# Patient Record
Sex: Female | Born: 1960 | Race: White | Hispanic: No | Marital: Married | Smoking: Current every day smoker
Health system: Southern US, Community
[De-identification: ages and names within clinical notes are randomized; demographics above are authoritative.]

## PROBLEM LIST (undated history)

## (undated) DIAGNOSIS — T7840XA Allergy, unspecified, initial encounter: Secondary | ICD-10-CM

## (undated) DIAGNOSIS — N183 Chronic kidney disease, stage 3 unspecified: Secondary | ICD-10-CM

## (undated) DIAGNOSIS — R519 Headache, unspecified: Secondary | ICD-10-CM

## (undated) DIAGNOSIS — H269 Unspecified cataract: Secondary | ICD-10-CM

## (undated) DIAGNOSIS — Z720 Tobacco use: Secondary | ICD-10-CM

## (undated) DIAGNOSIS — E039 Hypothyroidism, unspecified: Secondary | ICD-10-CM

## (undated) DIAGNOSIS — F32A Depression, unspecified: Secondary | ICD-10-CM

## (undated) DIAGNOSIS — F419 Anxiety disorder, unspecified: Secondary | ICD-10-CM

## (undated) DIAGNOSIS — I499 Cardiac arrhythmia, unspecified: Secondary | ICD-10-CM

## (undated) DIAGNOSIS — M797 Fibromyalgia: Secondary | ICD-10-CM

## (undated) DIAGNOSIS — R011 Cardiac murmur, unspecified: Secondary | ICD-10-CM

## (undated) DIAGNOSIS — G894 Chronic pain syndrome: Secondary | ICD-10-CM

## (undated) DIAGNOSIS — R51 Headache: Secondary | ICD-10-CM

## (undated) DIAGNOSIS — E669 Obesity, unspecified: Secondary | ICD-10-CM

## (undated) DIAGNOSIS — K3184 Gastroparesis: Secondary | ICD-10-CM

## (undated) DIAGNOSIS — M459 Ankylosing spondylitis of unspecified sites in spine: Secondary | ICD-10-CM

## (undated) DIAGNOSIS — M1990 Unspecified osteoarthritis, unspecified site: Secondary | ICD-10-CM

## (undated) DIAGNOSIS — R0902 Hypoxemia: Secondary | ICD-10-CM

## (undated) DIAGNOSIS — N200 Calculus of kidney: Secondary | ICD-10-CM

## (undated) DIAGNOSIS — J449 Chronic obstructive pulmonary disease, unspecified: Secondary | ICD-10-CM

## (undated) DIAGNOSIS — Z8719 Personal history of other diseases of the digestive system: Secondary | ICD-10-CM

## (undated) DIAGNOSIS — I10 Essential (primary) hypertension: Secondary | ICD-10-CM

## (undated) DIAGNOSIS — R7303 Prediabetes: Secondary | ICD-10-CM

## (undated) DIAGNOSIS — G4730 Sleep apnea, unspecified: Secondary | ICD-10-CM

## (undated) DIAGNOSIS — Z9981 Dependence on supplemental oxygen: Secondary | ICD-10-CM

## (undated) DIAGNOSIS — T4145XA Adverse effect of unspecified anesthetic, initial encounter: Secondary | ICD-10-CM

## (undated) DIAGNOSIS — E785 Hyperlipidemia, unspecified: Secondary | ICD-10-CM

## (undated) DIAGNOSIS — F329 Major depressive disorder, single episode, unspecified: Secondary | ICD-10-CM

## (undated) DIAGNOSIS — IMO0002 Reserved for concepts with insufficient information to code with codable children: Secondary | ICD-10-CM

## (undated) DIAGNOSIS — T8859XA Other complications of anesthesia, initial encounter: Secondary | ICD-10-CM

## (undated) DIAGNOSIS — K219 Gastro-esophageal reflux disease without esophagitis: Secondary | ICD-10-CM

## (undated) HISTORY — DX: Hypoxemia: R09.02

## (undated) HISTORY — DX: Reserved for concepts with insufficient information to code with codable children: IMO0002

## (undated) HISTORY — PX: TUBAL LIGATION: SHX77

## (undated) HISTORY — DX: Allergy, unspecified, initial encounter: T78.40XA

## (undated) HISTORY — DX: Unspecified cataract: H26.9

## (undated) HISTORY — PX: KNEE ARTHROSCOPY: SHX127

## (undated) HISTORY — DX: Cardiac murmur, unspecified: R01.1

## (undated) HISTORY — PX: TONSILLECTOMY: SUR1361

## (undated) HISTORY — DX: Unspecified osteoarthritis, unspecified site: M19.90

## (undated) HISTORY — PX: BACK SURGERY: SHX140

## (undated) HISTORY — PX: SPINE SURGERY: SHX786

## (undated) HISTORY — DX: Essential (primary) hypertension: I10

## (undated) HISTORY — PX: MANDIBLE FRACTURE SURGERY: SHX706

## (undated) HISTORY — PX: OTHER SURGICAL HISTORY: SHX169

## (undated) HISTORY — DX: Depression, unspecified: F32.A

## (undated) HISTORY — PX: APPENDECTOMY: SHX54

## (undated) HISTORY — PX: NECK SURGERY: SHX720

## (undated) HISTORY — PX: FRACTURE SURGERY: SHX138

## (undated) HISTORY — PX: JOINT REPLACEMENT: SHX530

## (undated) HISTORY — PX: CHOLECYSTECTOMY: SHX55

## (undated) HISTORY — DX: Hyperlipidemia, unspecified: E78.5

## (undated) HISTORY — PX: TOTAL KNEE ARTHROPLASTY: SHX125

## (undated) HISTORY — DX: Gastro-esophageal reflux disease without esophagitis: K21.9

## (undated) HISTORY — DX: Major depressive disorder, single episode, unspecified: F32.9

## (undated) HISTORY — DX: Chronic obstructive pulmonary disease, unspecified: J44.9

## (undated) HISTORY — DX: Sleep apnea, unspecified: G47.30

---

## 1997-08-11 ENCOUNTER — Ambulatory Visit (HOSPITAL_BASED_OUTPATIENT_CLINIC_OR_DEPARTMENT_OTHER): Admission: RE | Admit: 1997-08-11 | Discharge: 1997-08-11 | Payer: Self-pay | Admitting: Orthopedic Surgery

## 1997-10-24 ENCOUNTER — Encounter: Payer: Self-pay | Admitting: Orthopedic Surgery

## 1997-10-24 ENCOUNTER — Inpatient Hospital Stay (HOSPITAL_COMMUNITY): Admission: RE | Admit: 1997-10-24 | Discharge: 1997-10-27 | Payer: Self-pay | Admitting: Orthopedic Surgery

## 1998-09-21 ENCOUNTER — Ambulatory Visit (HOSPITAL_COMMUNITY): Admission: RE | Admit: 1998-09-21 | Discharge: 1998-09-21 | Payer: Self-pay | Admitting: Neurological Surgery

## 1998-09-21 ENCOUNTER — Encounter: Payer: Self-pay | Admitting: Neurological Surgery

## 1999-01-28 ENCOUNTER — Emergency Department (HOSPITAL_COMMUNITY): Admission: EM | Admit: 1999-01-28 | Discharge: 1999-01-28 | Payer: Self-pay | Admitting: Emergency Medicine

## 1999-03-19 ENCOUNTER — Ambulatory Visit (HOSPITAL_BASED_OUTPATIENT_CLINIC_OR_DEPARTMENT_OTHER): Admission: RE | Admit: 1999-03-19 | Discharge: 1999-03-19 | Payer: Self-pay | Admitting: Orthopedic Surgery

## 1999-06-06 ENCOUNTER — Ambulatory Visit (HOSPITAL_COMMUNITY): Admission: RE | Admit: 1999-06-06 | Discharge: 1999-06-06 | Payer: Self-pay | Admitting: Gastroenterology

## 1999-06-25 ENCOUNTER — Ambulatory Visit (HOSPITAL_BASED_OUTPATIENT_CLINIC_OR_DEPARTMENT_OTHER): Admission: RE | Admit: 1999-06-25 | Discharge: 1999-06-25 | Payer: Self-pay | Admitting: Orthopedic Surgery

## 1999-07-08 ENCOUNTER — Ambulatory Visit (HOSPITAL_COMMUNITY): Admission: RE | Admit: 1999-07-08 | Discharge: 1999-07-08 | Payer: Self-pay | Admitting: *Deleted

## 1999-07-08 ENCOUNTER — Encounter: Payer: Self-pay | Admitting: Rheumatology

## 1999-08-14 ENCOUNTER — Emergency Department (HOSPITAL_COMMUNITY): Admission: EM | Admit: 1999-08-14 | Discharge: 1999-08-14 | Payer: Self-pay | Admitting: Emergency Medicine

## 1999-08-28 ENCOUNTER — Ambulatory Visit (HOSPITAL_COMMUNITY): Admission: RE | Admit: 1999-08-28 | Discharge: 1999-08-28 | Payer: Self-pay | Admitting: Gastroenterology

## 1999-09-09 ENCOUNTER — Ambulatory Visit (HOSPITAL_COMMUNITY): Admission: RE | Admit: 1999-09-09 | Discharge: 1999-09-09 | Payer: Self-pay | Admitting: Neurological Surgery

## 1999-09-09 ENCOUNTER — Encounter: Payer: Self-pay | Admitting: Neurological Surgery

## 1999-09-12 ENCOUNTER — Encounter: Payer: Self-pay | Admitting: Emergency Medicine

## 1999-09-12 ENCOUNTER — Emergency Department (HOSPITAL_COMMUNITY): Admission: EM | Admit: 1999-09-12 | Discharge: 1999-09-12 | Payer: Self-pay | Admitting: Emergency Medicine

## 1999-09-17 ENCOUNTER — Ambulatory Visit (HOSPITAL_COMMUNITY): Admission: RE | Admit: 1999-09-17 | Discharge: 1999-09-18 | Payer: Self-pay | Admitting: Neurological Surgery

## 1999-09-17 ENCOUNTER — Encounter: Payer: Self-pay | Admitting: Neurological Surgery

## 1999-10-08 ENCOUNTER — Encounter: Payer: Self-pay | Admitting: Neurological Surgery

## 1999-10-08 ENCOUNTER — Encounter: Admission: RE | Admit: 1999-10-08 | Discharge: 1999-10-08 | Payer: Self-pay | Admitting: Neurological Surgery

## 1999-10-18 ENCOUNTER — Encounter: Payer: Self-pay | Admitting: Neurological Surgery

## 1999-10-18 ENCOUNTER — Ambulatory Visit (HOSPITAL_COMMUNITY): Admission: RE | Admit: 1999-10-18 | Discharge: 1999-10-18 | Payer: Self-pay | Admitting: Neurological Surgery

## 1999-11-14 ENCOUNTER — Ambulatory Visit (HOSPITAL_COMMUNITY): Admission: RE | Admit: 1999-11-14 | Discharge: 1999-11-14 | Payer: Self-pay | Admitting: Otolaryngology

## 1999-11-14 ENCOUNTER — Encounter: Payer: Self-pay | Admitting: Otolaryngology

## 1999-12-10 ENCOUNTER — Encounter: Payer: Self-pay | Admitting: Emergency Medicine

## 1999-12-10 ENCOUNTER — Observation Stay (HOSPITAL_COMMUNITY): Admission: EM | Admit: 1999-12-10 | Discharge: 1999-12-11 | Payer: Self-pay | Admitting: Emergency Medicine

## 2000-02-04 ENCOUNTER — Emergency Department (HOSPITAL_COMMUNITY): Admission: EM | Admit: 2000-02-04 | Discharge: 2000-02-04 | Payer: Self-pay | Admitting: Emergency Medicine

## 2000-02-04 ENCOUNTER — Encounter: Payer: Self-pay | Admitting: Emergency Medicine

## 2000-02-08 ENCOUNTER — Emergency Department (HOSPITAL_COMMUNITY): Admission: EM | Admit: 2000-02-08 | Discharge: 2000-02-08 | Payer: Self-pay | Admitting: Emergency Medicine

## 2000-02-09 ENCOUNTER — Encounter: Payer: Self-pay | Admitting: Emergency Medicine

## 2000-04-21 ENCOUNTER — Encounter: Payer: Self-pay | Admitting: Gastroenterology

## 2000-04-21 ENCOUNTER — Ambulatory Visit (HOSPITAL_COMMUNITY): Admission: RE | Admit: 2000-04-21 | Discharge: 2000-04-21 | Payer: Self-pay | Admitting: Gastroenterology

## 2000-08-26 ENCOUNTER — Encounter: Payer: Self-pay | Admitting: Family Medicine

## 2000-08-26 ENCOUNTER — Ambulatory Visit (HOSPITAL_COMMUNITY): Admission: RE | Admit: 2000-08-26 | Discharge: 2000-08-26 | Payer: Self-pay | Admitting: Family Medicine

## 2000-08-29 ENCOUNTER — Ambulatory Visit (HOSPITAL_COMMUNITY): Admission: RE | Admit: 2000-08-29 | Discharge: 2000-08-29 | Payer: Self-pay | Admitting: Family Medicine

## 2000-08-29 ENCOUNTER — Encounter: Payer: Self-pay | Admitting: Family Medicine

## 2000-12-19 ENCOUNTER — Emergency Department (HOSPITAL_COMMUNITY): Admission: EM | Admit: 2000-12-19 | Discharge: 2000-12-19 | Payer: Self-pay | Admitting: Emergency Medicine

## 2001-02-22 ENCOUNTER — Emergency Department (HOSPITAL_COMMUNITY): Admission: EM | Admit: 2001-02-22 | Discharge: 2001-02-23 | Payer: Self-pay | Admitting: Emergency Medicine

## 2001-05-01 ENCOUNTER — Ambulatory Visit (HOSPITAL_COMMUNITY): Admission: RE | Admit: 2001-05-01 | Discharge: 2001-05-01 | Payer: Self-pay | Admitting: Rheumatology

## 2001-05-01 ENCOUNTER — Encounter: Payer: Self-pay | Admitting: Rheumatology

## 2001-06-16 ENCOUNTER — Encounter: Admission: RE | Admit: 2001-06-16 | Discharge: 2001-06-16 | Payer: Self-pay | Admitting: Neurological Surgery

## 2001-06-16 ENCOUNTER — Encounter: Payer: Self-pay | Admitting: Neurological Surgery

## 2001-07-08 ENCOUNTER — Encounter: Admission: RE | Admit: 2001-07-08 | Discharge: 2001-07-08 | Payer: Self-pay | Admitting: Neurological Surgery

## 2001-07-08 ENCOUNTER — Encounter: Payer: Self-pay | Admitting: Neurological Surgery

## 2001-08-17 ENCOUNTER — Ambulatory Visit (HOSPITAL_COMMUNITY): Admission: RE | Admit: 2001-08-17 | Discharge: 2001-08-17 | Payer: Self-pay | Admitting: Family Medicine

## 2001-08-17 ENCOUNTER — Encounter: Payer: Self-pay | Admitting: Family Medicine

## 2001-10-15 ENCOUNTER — Encounter: Payer: Self-pay | Admitting: Neurological Surgery

## 2001-10-15 ENCOUNTER — Encounter: Admission: RE | Admit: 2001-10-15 | Discharge: 2001-10-15 | Payer: Self-pay | Admitting: Neurological Surgery

## 2002-04-09 ENCOUNTER — Emergency Department (HOSPITAL_COMMUNITY): Admission: EM | Admit: 2002-04-09 | Discharge: 2002-04-09 | Payer: Self-pay | Admitting: Emergency Medicine

## 2002-04-12 ENCOUNTER — Ambulatory Visit (HOSPITAL_COMMUNITY): Admission: RE | Admit: 2002-04-12 | Discharge: 2002-04-12 | Payer: Self-pay | Admitting: Neurological Surgery

## 2002-04-12 ENCOUNTER — Encounter: Payer: Self-pay | Admitting: Neurological Surgery

## 2002-06-01 ENCOUNTER — Encounter: Payer: Self-pay | Admitting: Neurological Surgery

## 2002-06-01 ENCOUNTER — Encounter: Admission: RE | Admit: 2002-06-01 | Discharge: 2002-06-01 | Payer: Self-pay | Admitting: Neurological Surgery

## 2002-06-01 ENCOUNTER — Encounter: Payer: Self-pay | Admitting: Radiology

## 2002-06-15 ENCOUNTER — Encounter: Admission: RE | Admit: 2002-06-15 | Discharge: 2002-06-15 | Payer: Self-pay | Admitting: Neurological Surgery

## 2002-06-15 ENCOUNTER — Encounter: Payer: Self-pay | Admitting: Neurological Surgery

## 2002-08-13 ENCOUNTER — Encounter: Payer: Self-pay | Admitting: Neurological Surgery

## 2002-08-13 ENCOUNTER — Encounter: Admission: RE | Admit: 2002-08-13 | Discharge: 2002-08-13 | Payer: Self-pay | Admitting: Neurological Surgery

## 2003-01-07 ENCOUNTER — Encounter: Admission: RE | Admit: 2003-01-07 | Discharge: 2003-01-07 | Payer: Self-pay | Admitting: Sports Medicine

## 2003-01-28 ENCOUNTER — Encounter (INDEPENDENT_AMBULATORY_CARE_PROVIDER_SITE_OTHER): Payer: Self-pay | Admitting: *Deleted

## 2003-01-28 ENCOUNTER — Encounter: Admission: RE | Admit: 2003-01-28 | Discharge: 2003-01-28 | Payer: Self-pay | Admitting: Family Medicine

## 2003-02-03 ENCOUNTER — Encounter: Admission: RE | Admit: 2003-02-03 | Discharge: 2003-02-03 | Payer: Self-pay | Admitting: Family Medicine

## 2003-02-03 ENCOUNTER — Ambulatory Visit (HOSPITAL_COMMUNITY): Admission: RE | Admit: 2003-02-03 | Discharge: 2003-02-03 | Payer: Self-pay | Admitting: Family Medicine

## 2003-02-14 ENCOUNTER — Encounter: Admission: RE | Admit: 2003-02-14 | Discharge: 2003-02-14 | Payer: Self-pay | Admitting: Family Medicine

## 2003-02-14 ENCOUNTER — Encounter: Admission: RE | Admit: 2003-02-14 | Discharge: 2003-02-14 | Payer: Self-pay | Admitting: Sports Medicine

## 2003-02-16 ENCOUNTER — Encounter: Admission: RE | Admit: 2003-02-16 | Discharge: 2003-02-16 | Payer: Self-pay | Admitting: Family Medicine

## 2003-03-22 ENCOUNTER — Encounter: Admission: RE | Admit: 2003-03-22 | Discharge: 2003-03-22 | Payer: Self-pay | Admitting: Family Medicine

## 2003-04-19 ENCOUNTER — Encounter: Admission: RE | Admit: 2003-04-19 | Discharge: 2003-04-19 | Payer: Self-pay | Admitting: Sports Medicine

## 2003-04-19 ENCOUNTER — Encounter: Admission: RE | Admit: 2003-04-19 | Discharge: 2003-04-19 | Payer: Self-pay | Admitting: Family Medicine

## 2003-10-27 ENCOUNTER — Encounter: Admission: RE | Admit: 2003-10-27 | Discharge: 2003-10-27 | Payer: Self-pay | Admitting: Rheumatology

## 2004-01-09 ENCOUNTER — Ambulatory Visit: Payer: Self-pay | Admitting: Sports Medicine

## 2004-02-09 ENCOUNTER — Ambulatory Visit: Payer: Self-pay | Admitting: Family Medicine

## 2004-07-26 ENCOUNTER — Encounter (INDEPENDENT_AMBULATORY_CARE_PROVIDER_SITE_OTHER): Payer: Self-pay | Admitting: *Deleted

## 2004-08-08 ENCOUNTER — Ambulatory Visit: Payer: Self-pay | Admitting: Family Medicine

## 2004-10-22 ENCOUNTER — Ambulatory Visit: Payer: Self-pay | Admitting: Family Medicine

## 2005-03-22 ENCOUNTER — Ambulatory Visit: Payer: Self-pay | Admitting: Family Medicine

## 2005-04-19 ENCOUNTER — Ambulatory Visit: Payer: Self-pay | Admitting: Family Medicine

## 2005-05-24 ENCOUNTER — Ambulatory Visit: Payer: Self-pay | Admitting: Family Medicine

## 2005-09-13 ENCOUNTER — Ambulatory Visit: Payer: Self-pay | Admitting: Family Medicine

## 2005-09-27 ENCOUNTER — Ambulatory Visit (HOSPITAL_COMMUNITY): Admission: RE | Admit: 2005-09-27 | Discharge: 2005-09-27 | Payer: Self-pay | Admitting: Family Medicine

## 2005-09-27 ENCOUNTER — Ambulatory Visit: Payer: Self-pay | Admitting: Family Medicine

## 2005-10-18 ENCOUNTER — Ambulatory Visit: Payer: Self-pay | Admitting: Family Medicine

## 2005-12-19 ENCOUNTER — Encounter (INDEPENDENT_AMBULATORY_CARE_PROVIDER_SITE_OTHER): Payer: Self-pay | Admitting: *Deleted

## 2005-12-19 ENCOUNTER — Ambulatory Visit (HOSPITAL_COMMUNITY): Admission: RE | Admit: 2005-12-19 | Discharge: 2005-12-19 | Payer: Self-pay | Admitting: Surgery

## 2006-01-06 ENCOUNTER — Ambulatory Visit: Payer: Self-pay | Admitting: Family Medicine

## 2006-03-18 ENCOUNTER — Ambulatory Visit: Payer: Self-pay | Admitting: Family Medicine

## 2006-03-20 DIAGNOSIS — F411 Generalized anxiety disorder: Secondary | ICD-10-CM

## 2006-03-20 DIAGNOSIS — K279 Peptic ulcer, site unspecified, unspecified as acute or chronic, without hemorrhage or perforation: Secondary | ICD-10-CM | POA: Insufficient documentation

## 2006-03-20 DIAGNOSIS — G4700 Insomnia, unspecified: Secondary | ICD-10-CM | POA: Insufficient documentation

## 2006-03-20 DIAGNOSIS — M069 Rheumatoid arthritis, unspecified: Secondary | ICD-10-CM | POA: Insufficient documentation

## 2006-03-20 DIAGNOSIS — N393 Stress incontinence (female) (male): Secondary | ICD-10-CM | POA: Insufficient documentation

## 2006-03-20 DIAGNOSIS — E785 Hyperlipidemia, unspecified: Secondary | ICD-10-CM | POA: Insufficient documentation

## 2006-03-20 DIAGNOSIS — I10 Essential (primary) hypertension: Secondary | ICD-10-CM

## 2006-03-20 DIAGNOSIS — M4150 Other secondary scoliosis, site unspecified: Secondary | ICD-10-CM

## 2006-03-20 DIAGNOSIS — M459 Ankylosing spondylitis of unspecified sites in spine: Secondary | ICD-10-CM | POA: Insufficient documentation

## 2006-03-20 DIAGNOSIS — F339 Major depressive disorder, recurrent, unspecified: Secondary | ICD-10-CM | POA: Insufficient documentation

## 2006-03-20 DIAGNOSIS — E669 Obesity, unspecified: Secondary | ICD-10-CM

## 2006-03-20 DIAGNOSIS — E7800 Pure hypercholesterolemia, unspecified: Secondary | ICD-10-CM

## 2006-03-20 DIAGNOSIS — M810 Age-related osteoporosis without current pathological fracture: Secondary | ICD-10-CM | POA: Insufficient documentation

## 2006-03-20 DIAGNOSIS — E039 Hypothyroidism, unspecified: Secondary | ICD-10-CM

## 2006-03-21 ENCOUNTER — Encounter (INDEPENDENT_AMBULATORY_CARE_PROVIDER_SITE_OTHER): Payer: Self-pay | Admitting: *Deleted

## 2006-03-24 ENCOUNTER — Encounter: Payer: Self-pay | Admitting: Family Medicine

## 2006-03-24 ENCOUNTER — Ambulatory Visit: Payer: Self-pay | Admitting: Family Medicine

## 2006-03-24 LAB — CONVERTED CEMR LAB
BUN: 7 mg/dL (ref 6–23)
CO2: 28 meq/L (ref 19–32)
Calcium: 8.8 mg/dL (ref 8.4–10.5)
Creatinine, Ser: 1.35 mg/dL — ABNORMAL HIGH (ref 0.40–1.20)
Glucose, Bld: 98 mg/dL (ref 70–99)
Sodium: 135 meq/L (ref 135–145)

## 2006-03-25 ENCOUNTER — Encounter: Admission: RE | Admit: 2006-03-25 | Discharge: 2006-03-25 | Payer: Self-pay | Admitting: Sports Medicine

## 2006-03-27 ENCOUNTER — Telehealth: Payer: Self-pay | Admitting: Family Medicine

## 2006-03-28 ENCOUNTER — Encounter: Payer: Self-pay | Admitting: Family Medicine

## 2006-03-31 ENCOUNTER — Telehealth: Payer: Self-pay | Admitting: *Deleted

## 2006-04-07 ENCOUNTER — Telehealth: Payer: Self-pay | Admitting: Family Medicine

## 2006-06-27 ENCOUNTER — Encounter: Payer: Self-pay | Admitting: Family Medicine

## 2006-07-03 ENCOUNTER — Encounter: Payer: Self-pay | Admitting: Family Medicine

## 2006-12-05 ENCOUNTER — Telehealth: Payer: Self-pay | Admitting: Family Medicine

## 2006-12-09 ENCOUNTER — Ambulatory Visit: Payer: Self-pay | Admitting: Family Medicine

## 2006-12-09 ENCOUNTER — Encounter: Payer: Self-pay | Admitting: Family Medicine

## 2006-12-09 LAB — CONVERTED CEMR LAB: TSH: 1.162 microintl units/mL (ref 0.350–5.50)

## 2006-12-17 ENCOUNTER — Encounter: Payer: Self-pay | Admitting: Family Medicine

## 2006-12-22 ENCOUNTER — Telehealth (INDEPENDENT_AMBULATORY_CARE_PROVIDER_SITE_OTHER): Payer: Self-pay | Admitting: *Deleted

## 2007-01-08 ENCOUNTER — Telehealth: Payer: Self-pay | Admitting: *Deleted

## 2007-02-02 ENCOUNTER — Telehealth: Payer: Self-pay | Admitting: *Deleted

## 2007-02-20 ENCOUNTER — Telehealth (INDEPENDENT_AMBULATORY_CARE_PROVIDER_SITE_OTHER): Payer: Self-pay | Admitting: Family Medicine

## 2007-02-21 ENCOUNTER — Encounter: Admission: RE | Admit: 2007-02-21 | Discharge: 2007-02-21 | Payer: Self-pay | Admitting: Rheumatology

## 2007-02-24 ENCOUNTER — Telehealth (INDEPENDENT_AMBULATORY_CARE_PROVIDER_SITE_OTHER): Payer: Self-pay | Admitting: Family Medicine

## 2007-03-02 ENCOUNTER — Telehealth: Payer: Self-pay | Admitting: Family Medicine

## 2007-04-21 ENCOUNTER — Inpatient Hospital Stay (HOSPITAL_COMMUNITY): Admission: RE | Admit: 2007-04-21 | Discharge: 2007-04-23 | Payer: Self-pay | Admitting: Neurological Surgery

## 2007-06-10 ENCOUNTER — Encounter: Payer: Self-pay | Admitting: *Deleted

## 2007-06-10 ENCOUNTER — Ambulatory Visit: Payer: Self-pay | Admitting: Family Medicine

## 2007-06-10 ENCOUNTER — Encounter: Payer: Self-pay | Admitting: Family Medicine

## 2007-06-10 DIAGNOSIS — N183 Chronic kidney disease, stage 3 (moderate): Secondary | ICD-10-CM

## 2007-06-10 DIAGNOSIS — N200 Calculus of kidney: Secondary | ICD-10-CM

## 2007-06-10 LAB — CONVERTED CEMR LAB
BUN: 5 mg/dL — ABNORMAL LOW (ref 6–23)
Creatinine, Ser: 1.13 mg/dL (ref 0.40–1.20)
Glucose, Bld: 81 mg/dL (ref 70–99)
Nitrite: NEGATIVE
Specific Gravity, Urine: 1.01
Urobilinogen, UA: 0.2
WBC Urine, dipstick: NEGATIVE

## 2007-06-24 ENCOUNTER — Telehealth (INDEPENDENT_AMBULATORY_CARE_PROVIDER_SITE_OTHER): Payer: Self-pay | Admitting: Family Medicine

## 2007-12-31 ENCOUNTER — Telehealth: Payer: Self-pay | Admitting: Family Medicine

## 2008-02-19 ENCOUNTER — Telehealth: Payer: Self-pay | Admitting: *Deleted

## 2008-02-25 ENCOUNTER — Encounter: Payer: Self-pay | Admitting: *Deleted

## 2008-06-07 ENCOUNTER — Encounter: Payer: Self-pay | Admitting: Family Medicine

## 2008-06-07 ENCOUNTER — Ambulatory Visit: Payer: Self-pay | Admitting: Family Medicine

## 2008-06-07 DIAGNOSIS — R87610 Atypical squamous cells of undetermined significance on cytologic smear of cervix (ASC-US): Secondary | ICD-10-CM

## 2008-06-07 LAB — CONVERTED CEMR LAB
Hgb A1c MFr Bld: 5.7 %
Pap Smear: HIGH

## 2008-06-08 LAB — CONVERTED CEMR LAB
ALT: 9 units/L (ref 0–35)
AST: 14 units/L (ref 0–37)
Alkaline Phosphatase: 97 units/L (ref 39–117)
BUN: 12 mg/dL (ref 6–23)
Basophils Absolute: 0 10*3/uL (ref 0.0–0.1)
Calcium: 9.5 mg/dL (ref 8.4–10.5)
Chloride: 99 meq/L (ref 96–112)
Creatinine, Ser: 1.45 mg/dL — ABNORMAL HIGH (ref 0.40–1.20)
Eosinophils Absolute: 0.2 10*3/uL (ref 0.0–0.7)
Eosinophils Relative: 2 % (ref 0–5)
HCT: 45 % (ref 36.0–46.0)
Lymphocytes Relative: 32 % (ref 12–46)
Lymphs Abs: 3.5 10*3/uL (ref 0.7–4.0)
MCV: 90.2 fL (ref 78.0–100.0)
Neutrophils Relative %: 62 % (ref 43–77)
Platelets: 396 10*3/uL (ref 150–400)
RDW: 13.6 % (ref 11.5–15.5)
WBC: 11.1 10*3/uL — ABNORMAL HIGH (ref 4.0–10.5)

## 2008-06-15 ENCOUNTER — Telehealth: Payer: Self-pay | Admitting: *Deleted

## 2008-09-05 ENCOUNTER — Telehealth: Payer: Self-pay | Admitting: Family Medicine

## 2008-09-09 ENCOUNTER — Ambulatory Visit: Payer: Self-pay | Admitting: Family Medicine

## 2008-09-09 ENCOUNTER — Encounter: Payer: Self-pay | Admitting: Family Medicine

## 2008-09-09 DIAGNOSIS — Z7189 Other specified counseling: Secondary | ICD-10-CM

## 2008-09-14 LAB — CONVERTED CEMR LAB
BUN: 11 mg/dL (ref 6–23)
CO2: 28 meq/L (ref 19–32)
Chloride: 98 meq/L (ref 96–112)
Creatinine, Ser: 1.31 mg/dL — ABNORMAL HIGH (ref 0.40–1.20)
Glucose, Bld: 103 mg/dL — ABNORMAL HIGH (ref 70–99)
LDL Cholesterol: 115 mg/dL — ABNORMAL HIGH (ref 0–99)
Potassium: 3.8 meq/L (ref 3.5–5.3)
VLDL: 53 mg/dL — ABNORMAL HIGH (ref 0–40)

## 2008-11-15 ENCOUNTER — Telehealth: Payer: Self-pay | Admitting: Family Medicine

## 2008-12-09 ENCOUNTER — Observation Stay (HOSPITAL_COMMUNITY): Admission: EM | Admit: 2008-12-09 | Discharge: 2008-12-09 | Payer: Self-pay | Admitting: Emergency Medicine

## 2008-12-26 ENCOUNTER — Encounter: Payer: Self-pay | Admitting: Family Medicine

## 2008-12-26 ENCOUNTER — Ambulatory Visit: Payer: Self-pay | Admitting: Family Medicine

## 2008-12-27 LAB — CONVERTED CEMR LAB
ALT: 10 units/L (ref 0–35)
AST: 10 units/L (ref 0–37)
Albumin: 4.1 g/dL (ref 3.5–5.2)
CO2: 25 meq/L (ref 19–32)
Calcium: 8.9 mg/dL (ref 8.4–10.5)
Chloride: 99 meq/L (ref 96–112)
Creatinine, Ser: 1.26 mg/dL — ABNORMAL HIGH (ref 0.40–1.20)
Potassium: 3.8 meq/L (ref 3.5–5.3)
Sodium: 137 meq/L (ref 135–145)
TSH: 5.998 microintl units/mL — ABNORMAL HIGH (ref 0.350–4.500)
Total Protein: 6.5 g/dL (ref 6.0–8.3)

## 2009-01-03 ENCOUNTER — Telehealth: Payer: Self-pay | Admitting: Family Medicine

## 2009-01-26 ENCOUNTER — Ambulatory Visit: Payer: Self-pay | Admitting: Family Medicine

## 2009-01-26 ENCOUNTER — Encounter: Payer: Self-pay | Admitting: Family Medicine

## 2009-01-26 DIAGNOSIS — R609 Edema, unspecified: Secondary | ICD-10-CM

## 2009-01-26 DIAGNOSIS — R630 Anorexia: Secondary | ICD-10-CM

## 2009-01-26 LAB — CONVERTED CEMR LAB
Bilirubin Urine: NEGATIVE
Nitrite: NEGATIVE
Protein, U semiquant: NEGATIVE
Sed Rate: 4 mm/hr (ref 0–22)
Urobilinogen, UA: 0.2
WBC, UA: >20 cells/hpf

## 2009-01-27 LAB — CONVERTED CEMR LAB
Basophils Absolute: 0 10*3/uL (ref 0.0–0.1)
Lymphocytes Relative: 45 % (ref 12–46)
Neutro Abs: 5.2 10*3/uL (ref 1.7–7.7)
Platelets: 348 10*3/uL (ref 150–400)
Prealbumin: 23.4 mg/dL (ref 18.0–45.0)
RDW: 15.3 % (ref 11.5–15.5)

## 2009-02-28 ENCOUNTER — Telehealth: Payer: Self-pay | Admitting: Family Medicine

## 2009-03-03 ENCOUNTER — Telehealth: Payer: Self-pay | Admitting: Family Medicine

## 2009-03-08 ENCOUNTER — Ambulatory Visit: Payer: Self-pay | Admitting: Family Medicine

## 2009-03-08 ENCOUNTER — Encounter: Payer: Self-pay | Admitting: Family Medicine

## 2009-03-09 LAB — CONVERTED CEMR LAB: TSH: 1.198 microintl units/mL (ref 0.350–4.500)

## 2009-03-30 ENCOUNTER — Encounter: Payer: Self-pay | Admitting: Family Medicine

## 2009-06-09 ENCOUNTER — Telehealth: Payer: Self-pay | Admitting: Family Medicine

## 2009-06-21 LAB — CONVERTED CEMR LAB
ALT: 13 units/L
Albumin: 4 g/dL
CO2: 26 meq/L
Calcium: 8.8 mg/dL
HCT: 46 %
Sodium: 138 meq/L
Total Bilirubin: 0.4 mg/dL

## 2009-06-23 ENCOUNTER — Ambulatory Visit: Payer: Self-pay | Admitting: Family Medicine

## 2009-06-23 ENCOUNTER — Encounter: Payer: Self-pay | Admitting: Family Medicine

## 2009-06-23 LAB — CONVERTED CEMR LAB
Bilirubin Urine: NEGATIVE
Blood in Urine, dipstick: NEGATIVE
Blood in Urine, dipstick: NEGATIVE
Epithelial cells, urine: >20 /lpf
Ketones, urine, test strip: NEGATIVE
Nitrite: NEGATIVE
Protein, U semiquant: NEGATIVE
Protein, U semiquant: NEGATIVE
Urobilinogen, UA: 0.2
pH: 7

## 2009-06-26 ENCOUNTER — Encounter: Payer: Self-pay | Admitting: *Deleted

## 2009-06-29 ENCOUNTER — Telehealth: Payer: Self-pay | Admitting: Family Medicine

## 2009-07-03 ENCOUNTER — Encounter: Payer: Self-pay | Admitting: Family Medicine

## 2009-08-22 ENCOUNTER — Encounter: Payer: Self-pay | Admitting: *Deleted

## 2009-09-06 ENCOUNTER — Ambulatory Visit: Payer: Self-pay | Admitting: Family Medicine

## 2009-09-06 ENCOUNTER — Encounter: Payer: Self-pay | Admitting: Family Medicine

## 2009-09-06 DIAGNOSIS — R51 Headache: Secondary | ICD-10-CM

## 2009-09-06 DIAGNOSIS — R519 Headache, unspecified: Secondary | ICD-10-CM | POA: Insufficient documentation

## 2009-09-07 ENCOUNTER — Encounter: Payer: Self-pay | Admitting: *Deleted

## 2009-09-07 ENCOUNTER — Encounter: Payer: Self-pay | Admitting: Family Medicine

## 2009-09-07 LAB — CONVERTED CEMR LAB
CO2: 28 meq/L (ref 19–32)
Chloride: 97 meq/L (ref 96–112)
Platelets: 409 10*3/uL — ABNORMAL HIGH (ref 150–400)
Potassium: 3.5 meq/L (ref 3.5–5.3)
RDW: 15.9 % — ABNORMAL HIGH (ref 11.5–15.5)
Sodium: 138 meq/L (ref 135–145)
Vit D, 25-Hydroxy: 29 ng/mL — ABNORMAL LOW (ref 30–89)
WBC: 9.9 10*3/uL (ref 4.0–10.5)

## 2009-09-08 ENCOUNTER — Telehealth: Payer: Self-pay | Admitting: *Deleted

## 2009-10-13 ENCOUNTER — Ambulatory Visit (HOSPITAL_COMMUNITY): Admission: RE | Admit: 2009-10-13 | Discharge: 2009-10-13 | Payer: Self-pay | Admitting: Gastroenterology

## 2009-10-18 ENCOUNTER — Ambulatory Visit (HOSPITAL_COMMUNITY): Admission: RE | Admit: 2009-10-18 | Discharge: 2009-10-18 | Payer: Self-pay | Admitting: Gastroenterology

## 2009-11-30 ENCOUNTER — Encounter: Payer: Self-pay | Admitting: *Deleted

## 2009-12-01 ENCOUNTER — Telehealth: Payer: Self-pay | Admitting: Family Medicine

## 2010-02-11 ENCOUNTER — Encounter: Payer: Self-pay | Admitting: Sports Medicine

## 2010-02-11 ENCOUNTER — Encounter: Payer: Self-pay | Admitting: Family Medicine

## 2010-02-20 NOTE — Progress Notes (Signed)
Summary: triage  Phone Note Call from Patient Call back at [phone]   Caller: Patient Summary of Call: pt gout is swollen reall bad and can't walk - no one to drive her to appt this afternoon Initial call taken by: De Nurse,  December 01, 2009 9:41 AM  Follow-up for Phone Call        Have forwarded to Dr. Katrinka Blazing for advise. Follow-up by: Dennison Nancy RN,  December 01, 2009 9:47 AM  Additional Follow-up for Phone Call Additional follow up Details #1::        Will send in 5 days of prednisone but she will need to go pick it up. If still hurts by monday please come in to be seen .  called pt and told her this.  Pt  Additional Follow-up by: [name] DO,  December 01, 2009 10:30 AM    New/Updated Medications: PREDNISONE 20 MG TABS (PREDNISONE) take 2 tabs by mouth daily for the next 5 days. Prescriptions: PREDNISONE 20 MG TABS (PREDNISONE) take 2 tabs by mouth daily for the next 5 days.  #10 x 0   Entered and Authorized by:   [name] DO   Signed by:   [name] DO on 12/01/2009   Method used:   Faxed to ...       [facility] (retail)       [address]       [place], [place]    [place]       Ph: [phone]       Fax: [phone]   RxID:   [id number] PREDNISONE 20 MG TABS (PREDNISONE) take 2 tabs by mouth daily for the next 5 days.  #10 x 0   Entered and Authorized by:   [name] DO   Signed by:   [name] DO on 12/01/2009   Method used:   [facility] to        [facility] [phone]* (retail)       [address]       [place], [place]  [address]       Ph: [phone] or [phone]       Fax: [phone]   RxID:   [id number]

## 2010-02-20 NOTE — Progress Notes (Signed)
Summary: phn msg  Phone Note Call from Patient   Caller: Patient Summary of Call: Pt is supposed to have a test set up, but the number she can be reached at for now is [phone] until Monday afternoon. Initial call taken by: Clydell Hakim,  September 08, 2009 1:34 PM  Follow-up for Phone Call        Patient informed about referral to GI Follow-up by: Garen Grams LPN,  September 08, 2009 5:30 PM

## 2010-02-20 NOTE — Assessment & Plan Note (Signed)
Summary: headache/dizzy,df   Vital Signs:  Patient profile:   50 year old female Height:      63 inches Weight:      214.5 pounds BMI:     38.13 Temp:     98.5 degrees F oral Pulse rate:   109 / minute BP sitting:   136 / 93  (left arm) Cuff size:   regular  Vitals Entered By: Candice Grams LPN (September 06, 2009 3:21 PM) CC: nausea/vomiting with dizziness Is Patient Diabetic? No Pain Assessment Patient in pain? no        Primary Care Provider:  [name] DO  CC:  nausea/vomiting with dizziness.  History of Present Illness: [name] is a 50 yo female with multiple problems and numerous complaints  1. Hypothyroidism-  Pt been taking her medicaine regularly last TSH checked in 2/11 and was normal, denies any hair loss, but does states she has had swelling, weight gain and chronic fatiuge but pt states this is her baseline. Pt denies any palpataintions. and seems to be doing fine with medication with no side effects.  2. HA-  Pt states that it is continuous, all over does ot cause vision changes, pt soes state she has numbness in her arms but that it is not new and is from another injury that she does not remember.  Pt still able to do all dialy activities without any problems.   3.  N/v-  Pt states she is vomiting more after meals but can sometimes keep food down but only can eat a little at a time.  It seems mostly related with food but can be nauseated at any time.  Pt denies blood in vomit, denies any weight loss, not associated with headache but can worsen a headache if she already has it. Pt does state chronic constipation that only take miralax as needed.  has hx of peptic ulcer disease and last time scoped was 1999 per pt.   4.  Still smoking but down to 1/2 ppd.  still sttempting to cut back'  5.  Htn-  Seems controlled does not know though if above symptoms are due to this because never check blood pressure at home No side effect from medication.   Habits &  Providers  Alcohol-Tobacco-Diet     Tobacco Status: current     Tobacco Counseling: to quit use of tobacco products     Cigarette Packs/Day: 0.5  Current Medications (verified): 1)  Triamterene-Hctz 37.5-25 Mg  Caps (Triamterene-Hctz) .... Once Daily 2)  Prevacid 30 Mg  Cpdr (Lansoprazole) .... One Tab By Mouth Daily 3)  Flexeril 10 Mg  Tabs (Cyclobenzaprine Hcl) .... Once At Bedtime 4)  Synthroid 75 Mcg Tabs (Levothyroxine Sodium) .... One By Mouth Daily For Thyroid 5)  Oxycontin 20 Mg  Tb12 (Oxycodone Hcl) .... Po Two Times A Day 6)  Alprazolam 0.5 Mg  Tabs (Alprazolam) .... Take One Tablet Twice Daily As Needed For Anxiety 7)  Neurontin 100 Mg Caps (Gabapentin) .... Take 2 Tabs By Mouth Two Times A Day 8)  Citalopram Hydrobromide 20 Mg Tabs (Citalopram Hydrobromide) .... One By Mouth Daily For Depression 9)  Miralax  Powd (Polyethylene Glycol 3350) .... Take 17 G (One Capful) in 8 Oz Water 1-4 Times Per Day, Increase/decrease To One Soft Stool Daily; Disp 1 Bottle 10)  Lipitor 10 Mg Tabs (Atorvastatin Calcium) .... One By Mouth Daily 11)  Nortriptyline Hcl 25 Mg Caps (Nortriptyline Hcl) .... Take 1 Cap By Mouth At  Bedtime 12)  Colace 100 Mg Caps (Docusate Sodium) .... Take 1 Cap By Mouth Two Times A Day  Allergies (verified): 1)  ! Codeine 2)  ! * Latex  Past History:  Past medical, surgical, family and social histories (including risk factors) reviewed, and no changes noted (except as noted below).  Past Medical History: Reviewed history from 09/09/2008 and no changes required. ankylosing spondylitis peptic ulcer disease scoliosis CRI - baseline Cr  ~ 1.3 hypothyroidism HTN HLD rheumatoid arthritis h/o dental abscesses,  renal u/s= small L kidney w/cyst, R wnl Depression Chronic back pain  Past Surgical History: Reviewed history from 09/09/2008 and no changes required. 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 2 whole brothers, 1/2 brothers x 3 - asthma, dad recently deceased age 2, RA, skin CA, constipation., mom died in 2022-09-02 age 65, CAD, CVA, vertigo, depression/anxiety, son - healthy  Social History: Reviewed history from 12/26/2008 and no changes required. Married, lives w/ husband, 22yo son; (currently fighting for full custody of granddaughter [name] - next court date Jan 2011).  Disabled Chartered certified accountant, last worked in 1999.  Smokes 1ppd since age 80. Down to 1/2 t 1 PPD since Sep 02, 2022. Denies etoh or drugs.Packs/Day:  0.5  Review of Systems       see hpi  Physical Exam  General:  afebrile, tachycardic on vitals. normal rate on exam. Eyes:  EOMI, sclerea clear Mouth:  MMM Lungs:  work of breathing unlabored, clear to auscultation bilaterally; no wheezes, rales, or ronchi; good air movement throughout  Heart:  regular rate and rhythm, no murmurs; normal s1/s2  Abdomen:  BS+, Nt, obese Pulses:  DP and radial pulses 2+ bilaterally  Extremities:  no cyanosis, clubbing, or edema  multiple scars on knee from surgeries.    Impression & Recommendations:  Problem # 1:  HYPOTHYROIDISM, UNSPECIFIED (ICD-244.9) Assessment Unchanged Will check TSH seems to be doing well.  Her updated medication list for this problem includes:    Synthroid 75 Mcg Tabs (Levothyroxine sodium) ..... One by mouth daily for thyroid  Orders: TSH-[facility] [id number]) Vit D, 25 [facility] [id number]) [facility]- Est  Level 4 (70623)  Labs Reviewed: TSH: 1.198 (03/08/2009)    HgBA1c: 5.7 (06/07/2008) Chol: 201 (09/09/2008)   HDL: 33  (09/09/2008)   LDL: 115 (09/09/2008)   TG: 266 (09/09/2008)  Problem # 2:  HYPERTENSION, BENIGN SYSTEMIC (ICD-401.1) Assessment: Unchanged Blood pressure just outside goal today will monitor, no change for now would consider goin up beta blocker as next treatment, may try something like propanolol to help with headache as well, will see again and check at /u.   Her updated medication list for this problem includes:    Triamterene-hctz 37.5-25 Mg Caps (Triamterene-hctz) ..... Once daily  Orders: T-Basic Metabolic Panel [id number]) CBC-[facility] (85027) [facility]- Est  Level 4 (99214)  BP today: 136/93 Prior BP: 114/63 (06/23/2009)  Prior 10 Yr Risk Heart Disease: 8 % (06/23/2009)  Labs Reviewed: K+: 3.9 (06/21/2009) Creat: : 1.43 (06/21/2009)   Chol: 201 (09/09/2008)   HDL: 33 (09/09/2008)  LDL: 115 (09/09/2008)   TG: 266 (09/09/2008)  Problem # 3:  SMOKER (ICD-305.1) Assessment: Improved down to half ppd, will still aid pt if need help, will avoid medications at least chantix due to hx of depression and pt multiple chronic problems taht do not seem to be seen on physical exam and pt exagerrating her problems like haiving one kidney.  Orders: Vit D, 25 OH-[facility] ([id number]) [facility]- Est  Level 4 (62130)  Problem # 4:  RENAL FAILURE, CHRONIC (ICD-585.9) Assessment: Unchanged Pt baseline creat seems to be 1.3 will get BMET and see where she is at moment.  May need to change blood pressure medications if continues to elevate.    Problem # 5:  PEPTIC ULCER DIS., UNSPEC. W/O OBSTRUCTION (ICD-533.90) Will continue the previcid, seems to be doing well, gives hx of N/V with eating small amounts of food, does state chronically constipated and scould have some gastroparesis, told to try small meals and multiple meals, try to take colace and miralax to keep regular, if continue will need to have EGD, will order now because pt has been seen for this problem multiple times. If negative will get gastric  emptying study to see if a prokinetic may be of benefit.  Her updated medication list for this problem includes:    Prevacid 30 Mg Cpdr (Lansoprazole) ..... One tab by mouth daily  Orders: CBC-[facility] (86578) [facility]- Est  Level 4 (46962) Gastroenterology Referral (GI)  Problem # 6:  HEADACHE (ICD-784.0) Pt gets her pain meds and her anti anxiety from another provider so will not prescribe here. Pt HA may most likely be multifactorial will try nortriptilline to see if help If continue and exhaust options then will need to possibly scan head, sounds like could be migraines though and may need to look and other conservative options.   Could also be related to blood pressure but seems controlled.  Her updated medication list for this problem includes:    Oxycontin 20 Mg Tb12 (Oxycodone hcl) .Marland Kitchen... Po two times a day  Orders: CBC-[facility] (95284) [facility]- Est  Level 4 (13244)  Complete Medication List: 1)  Triamterene-hctz 37.5-25 Mg Caps (Triamterene-hctz) .... Once daily 2)  Prevacid 30 Mg Cpdr (Lansoprazole) .... One tab by mouth daily 3)  Flexeril 10 Mg Tabs (Cyclobenzaprine hcl) .... Once at bedtime 4)  Synthroid 75 Mcg Tabs (Levothyroxine sodium) .... One by mouth daily for thyroid 5)  Oxycontin 20 Mg Tb12 (Oxycodone hcl) .... Po two times a day 6)  Alprazolam 0.5 Mg Tabs (Alprazolam) .... Take one tablet twice daily as needed for anxiety 7)  Neurontin 100 Mg Caps (Gabapentin) .... Take 2 tabs by mouth two times a day 8)  Citalopram Hydrobromide 20 Mg Tabs (Citalopram hydrobromide) .... One by mouth daily for depression 9)  Miralax Powd (Polyethylene glycol 3350) .... Take 17 g (one capful) in 8 oz water 1-4 times per day, increase/decrease to one soft stool daily; disp 1 bottle 10)  Lipitor 10 Mg Tabs (Atorvastatin calcium) .... One by mouth daily 11)  Nortriptyline Hcl 25 Mg Caps (Nortriptyline hcl) .... Take 1 cap by mouth at bedtime 12)  Colace 100 Mg Caps (Docusate sodium) .... Take 1 cap by mouth  two times a day  Patient Instructions: 1)  Nice to meet you 2)  I am giving you a new medication for your constipation.  It is called colace take one pill by mouth two times a day.  If your stools get too loose then go  to once daily 3)  I think it would be a good idea to follow up with GI and have a scope to llok at your stomach due to this chronic nausea and vomiting. 4)  I am also giving you a new medication called nortryptylline.  Take 1 pill at night.  This could help with your headaches, sleep and chronmic pain.  I need to see you again in 1 month to see how you are doing.  5)  I will get some labs on you today as well.  Prescriptions: COLACE 100 MG CAPS (DOCUSATE SODIUM) take 1 cap by mouth two times a day  #62 x 3   Entered and Authorized by:   Candice Primas DO   Signed by:   Candice Primas DO on 09/06/2009   Method used:   [facility] to        [facility] [phone]* (retail)       [address]       [place], [place]  [address]       Ph: [phone] or [phone]       Fax: [phone]   RxID:   [id number] NORTRIPTYLINE HCL 25 MG CAPS (NORTRIPTYLINE HCL) take 1 cap by mouth at bedtime  #31 x 2   Entered and Authorized by:   Candice Primas DO   Signed by:   Candice Primas DO on 09/06/2009   Method used:   Electronically to        [facility] [phone]* (retail)       [address]       [place], [place]  [address]       Ph: [phone] or [phone]       Fax: [phone]   RxID:   [id number] MIRALAX  POWD (POLYETHYLENE GLYCOL 3350) take 17 g (one capful) in 8 oz water 1-4 times per day, increase/decrease to one soft stool daily; disp 1 bottle  #1 x 11   Entered and Authorized by:   Candice Primas DO   Signed by:   Candice Primas DO on 09/06/2009   Method used:   Electronically to        [facility]. [phone]* (retail)       [address]       [place], [place]  [address]       Ph: [phone] or [phone]        Fax: [phone]   RxID:   [id number] CITALOPRAM HYDROBROMIDE 20 MG TABS (CITALOPRAM HYDROBROMIDE) one by mouth daily for depression  #30 Each x 2   Entered and Authorized by:   Candice Primas DO   Signed by:   Candice Primas DO on 09/06/2009   Method used:   Electronically to        [facility]. [phone]* (retail)       [address]       [place], [place]  [address]       Ph: [phone] or [phone]       Fax: [phone]   RxID:   [id number] SYNTHROID 75 MCG TABS (LEVOTHYROXINE SODIUM) one by mouth daily for thyroid  #30.0 Each x 3   Entered and Authorized by:   Candice Primas DO   Signed by:   Candice Primas DO on 09/06/2009   Method  used:   Electronically to        [facility] [phone]* (retail)       [address]       [place], [place]  [address]       Ph: [phone] or [phone]       Fax: [phone]   RxID:   [id number] PREVACID 30 MG  CPDR (LANSOPRAZOLE) one tab by mouth daily  #30 Each x 2   Entered and Authorized by:   Candice Primas DO   Signed by:   Candice Primas DO on 09/06/2009   Method used:   Electronically to        [facility] [phone]* (retail)       [address]       [place], [place]  [address]       Ph: [phone] or [phone]       Fax: [phone]   RxID:   [id number] TRIAMTERENE-HCTZ 37.5-25 MG  CAPS (TRIAMTERENE-HCTZ) once daily  #90 x 2   Entered and Authorized by:   Candice Primas DO   Signed by:   Candice Primas DO on 09/06/2009   Method used:   Electronically to        [facility]. [phone]* (retail)       [address]       [place], [place]  [address]       Ph: [phone] or [phone]       Fax: [phone]   RxID:   [id number]   Prevention & Chronic Care Immunizations   Influenza vaccine: Not documented    Tetanus booster: 01/22/1999: Done.   Tetanus booster due: 01/21/2009    Pneumococcal vaccine: Not documented  Other Screening    Pap smear: ASCUS - no high risk HPV  (06/07/2008)   Pap smear due: 06/07/2009    Mammogram: Done.  (07/26/2004)   Mammogram due: 07/26/2005   Smoking status: current  (09/06/2009)  Lipids   Total Cholesterol: 201  (09/09/2008)   Lipid panel action/deferral: Lipid Panel ordered   LDL: 115  (09/09/2008)   LDL Direct: 94  (12/26/2008)   HDL: 33  (09/09/2008)   Triglycerides: 266  (09/09/2008)    SGOT (AST): 11  (06/21/2009)   SGPT (ALT): 13  (06/21/2009)   Alkaline phosphatase: 108  (12/26/2008)   Total bilirubin: 0.4  (06/21/2009)    Lipid flowsheet reviewed?: Yes   Progress toward LDL goal: Unchanged  Hypertension   Last Blood Pressure: 136 / 93  (09/06/2009)   Serum creatinine: 1.43  (06/21/2009)   BMP action: Ordered   Serum potassium 3.9  (06/21/2009)   Basic metabolic panel due: 12/07/2009  Self-Management Support :   Personal Goals (by the next clinic visit) :      Personal blood pressure goal: 140/90  (09/09/2008)     Personal LDL goal: 130  (09/09/2008)    Hypertension self-management support: Written self-care plan  (03/08/2009)    Hypertension self-management support not done because: Good outcomes  (03/08/2009)    Lipid self-management support: Written self-care plan  (03/08/2009)     Lipid self-management support not done because: Good outcomes  (03/08/2009)

## 2010-02-20 NOTE — Progress Notes (Signed)
Summary: Rx Prob  Phone Note Call from Patient Call back at Home Phone [phone]   Caller: Patient Summary of Call: Dr. Rexene Alberts wanted pt to cut bp meds in half, but her medication is in capsule form.  What should she do? Initial call taken by: Clydell Hakim,  June 29, 2009 3:19 PM  Follow-up for Phone Call        paged Dr. Rexene Alberts and advised him of message from patient.   he states for patient to continue BP med  triamterene/ HCTZ as is  , taking one  capsule daily. mentioned to patient that she had an appointment yesterday but she states that was an old appointment that was scheduled before she was seen by Dr. Rexene Alberts on an emergent basis on 06/23/2009. also advised patient that she was to return for labs in one week from 06/23/2009. she states this was because she was supposed to have cut her BP med in half and she did not do this.  will send message to MD to please advise when he needs to see her and if she needs labs soon. there are no available appointments with Dr. Rexene Alberts until 07/12/2009.  appointment is scheduled for that day.   tried to  get patient to come in tomorrow for a work in appointment slot but she has to take her mother to get her hair fixed and to the doctor..  Follow-up by: Theresia Lo RN,  June 29, 2009 4:01 PM  Additional Follow-up for Phone Call Additional follow up Details #1::        needs to keep appointment 6/22. will do labs at that time. please call pt to advise. thanks.  Additional Follow-up by: Myrtie Soman  MD,  June 30, 2009 10:29 AM    Additional Follow-up for Phone Call Additional follow up Details #2::    message left on voicemail . Follow-up by: Theresia Lo RN,  June 30, 2009 10:47 AM

## 2010-02-20 NOTE — Miscellaneous (Signed)
Summary: Discussion about No Shows  Spoke with patient about her no shows.  According to her she is seeing 4 different doctors as well as making arrangements for her grandmother's doctor's appointments.  She admitted that she is having a hard time keeping all of them straight.  I suggested that she keep a calendar in her purse and when she schedules appts at the physician's office, she has the receptionist write those appts in her calendar.  Pt agreeable to this and was apologetic about the no shows.  I did warn her that if she continues to no show she will become a "work-in" patient only and will lose the priviledge of have a continuity physician.  She understood.  Dennison Nancy RN  November 30, 2009 10:17 AM

## 2010-02-20 NOTE — Miscellaneous (Signed)
Summary: chart update  Challenging patient. Sees Dr. Corliss Skains for ankylosing spondylitis. She manages her benzo and narcotic use. Often presents with vague complaints (e.g. "I'm swelling)  with no obvious findings on physical exam and negative w/u. Has told me that she has goon 2-3 weeks without eating anything but a bowl of soup with normal albumin and prealbumin levels. The histories she provides often seem dubious; however she does have several medical problems so this makes her even more challenging to figure out. Often does not keep follow-up appointments and has recently received a probation letter.  Clinical Lists Changes  Problems: Removed problem of PRURITUS (ICD-698.9) Removed problem of ROUTINE GYNECOLOGICAL EXAMINATION (ICD-V72.31) Removed problem of ABDOMINAL PAIN RIGHT LOWER QUADRANT (ICD-789.03) Removed problem of CONSTIPATION (ICD-564.0) Removed problem of BLEEDING, RECTAL (ICD-569.3) Removed problem of BACK PAIN W/RADIATION, UNSPECIFIED (ICD-724.4) Observations: Added new observation of PRIMARY MD: Myrtie Soman  MD (07/03/2009 10:41)

## 2010-02-20 NOTE — Letter (Signed)
Summary: Suspension Letter  [facility]  [address]   [place], [place] [address]   Phone: [phone]  Fax: [phone]    08/22/2009  LAKERIA STARKMAN [address] [place], [place]  [address]  Dear [name],  You have missed 4 scheduled appointments with our practice.If you cannot keep your appointment, we expect you to call and cancel at least 24 hours before your appointment time.  As per our policy, we will now only give you limited medical services. means we will not call in a refill for you, or complete a form or make a referral except when you are here for a scheduled office visit.   If you miss 2 more appointments in the next year, we will dismiss you from our practice.  We hope this does not happen.  If you keep your appointments for the next year you will be returned to regular patient status.  We hope these changes will encourage you to keep your appointments so we may provide you the best medical care.   Our office staff can be reached at [phone] Monday through Friday from 8:30 a.m.-5:00 p.m. and will be glad to schedule your appointment as necessary.     Sincerely,   [facility]

## 2010-02-20 NOTE — Progress Notes (Signed)
Summary: resch  Called pt's home phone; left message to call back at earliest convenience.    Phone Note Call from Patient Call back at Home Phone [phone]   Caller: Patient Summary of Call: needs to resch nutrition Initial call taken by: De Nurse,  February 28, 2009 2:43 PM

## 2010-02-20 NOTE — Letter (Signed)
Summary: *Referral Letter  [facility]  [address]   [place], [place] [address]   Phone: [phone]  Fax: [phone]    09/07/2009  Thank you in advance for agreeing to see my patient:  [name] [address], [place]  [address]  Phone: [phone]  Reason for Referral: History of peptic ulcer disease and likely H pylori infection in the past with worsening nausea and vomiting with eating that has gotten worse.  Hgb stable at 15.5 so not bleeding but has been over 10 years since last evaluation.   Procedures Requested: EGD  Current Medical Problems: 1)  HEADACHE (ICD-784.0) 2)  EDEMA (ICD-782.3) 3)  LOSS OF APPETITE (ICD-783.0) 4)  SMOKER (ICD-305.1) 5)  ASCUS PAP (ICD-795.01) 6)  SCREENING FOR MALIGNANT NEOPLASM OF THE CERVIX (ICD-V76.2) 7)  RENAL FAILURE, CHRONIC (ICD-585.9) 8)  NEPHROLITHIASIS (ICD-592.0) 9)  HEALTH MAINTENANCE EXAM (ICD-V70.0) 10)  SCOLIOSIS (ICD-737.43) 11)  RHEUMATOID ARTHRITIS (NOT JUVENILE) (ICD-714.0) 12)  PEPTIC ULCER DIS., UNSPEC. W/O OBSTRUCTION (ICD-533.90) 13)  OSTEOPOROSIS, UNSPECIFIED (ICD-733.00) 14)  OBESITY, NOS (ICD-278.00) 15)  INSOMNIA NOS (ICD-780.52) 16)  INCONTINENCE, STRESS, FEMALE (ICD-625.6) 17)  HYPOTHYROIDISM, UNSPECIFIED (ICD-244.9) 18)  HYPERTENSION, BENIGN SYSTEMIC (ICD-401.1) 19)  HYPERCHOLESTEROLEMIA (ICD-272.0) 20)  DEPRESSION, MAJOR, RECURRENT (ICD-296.30) 21)  ANXIETY (ICD-300.00) 22)  ANKYLOSING SPONDYLITIS (ICD-720.0)   Current Medications: 1)  TRIAMTERENE-HCTZ 37.5-25 MG  CAPS (TRIAMTERENE-HCTZ) once daily 2)  PREVACID 30 MG  CPDR (LANSOPRAZOLE) one tab by mouth daily 3)  FLEXERIL 10 MG  TABS (CYCLOBENZAPRINE HCL) once at bedtime 4)  SYNTHROID 75 MCG TABS (LEVOTHYROXINE SODIUM) one by mouth daily for thyroid 5)  OXYCONTIN 20 MG  TB12 (OXYCODONE HCL) po two times a day 6)  ALPRAZOLAM 0.5 MG  TABS (ALPRAZOLAM) take one tablet twice daily as needed for anxiety 7)  NEURONTIN  100 MG CAPS (GABAPENTIN) take 2 tabs by mouth two times a day 8)  CITALOPRAM HYDROBROMIDE 20 MG TABS (CITALOPRAM HYDROBROMIDE) one by mouth daily for depression 9)  MIRALAX  POWD (POLYETHYLENE GLYCOL 3350) take 17 g (one capful) in 8 oz water 1-4 times per day, increase/decrease to one soft stool daily; disp 1 bottle 10)  LIPITOR 10 MG TABS (ATORVASTATIN CALCIUM) one by mouth daily 11)  NORTRIPTYLINE HCL 25 MG CAPS (NORTRIPTYLINE HCL) take 1 cap by mouth at bedtime 12)  COLACE 100 MG CAPS (DOCUSATE SODIUM) take 1 cap by mouth two times a day   Past Medical History: 1)  ankylosing spondylitis 2)  peptic ulcer disease 3)  scoliosis 4)  CRI - baseline Cr  ~ 1.3 5)  hypothyroidism 6)  HTN 7)  HLD 8)  rheumatoid arthritis 9)  h/o dental abscesses,  10)  renal u/s= small L kidney w/cyst, R wnl 11)  Depression 12)  Chronic back pain   Prior History of Blood Transfusions:   Pertinent Labs:    Thank you again for agreeing to see our patient; please contact us if you have any further questions or need additional information.  Sincerely,  [name] DO

## 2010-02-20 NOTE — Consult Note (Signed)
Summary: [facility]  [facility]   Imported By: Marily Memos 04/05/2009 10:59:35  _____________________________________________________________________  External Attachment:    Type:   Image     Comment:   External Document

## 2010-02-20 NOTE — Assessment & Plan Note (Signed)
Summary: f/u,df   Vital Signs:  Patient profile:   50 year old female Height:      63 inches Weight:      217 pounds BMI:     38.58 Temp:     98.3 degrees F oral Pulse rate:   117 / minute BP sitting:   137 / 85  (right arm) Cuff size:   large  Vitals Entered By: Hilda Blades CMA (March 08, 2009 4:44 PM) CC: routine f/u Is Patient Diabetic? No Pain Assessment Patient in pain? yes     Location: neck/left foot Intensity: 9   Primary Care Provider:  Myrtie Soman  MD  CC:  routine f/u.  History of Present Illness: 1. edema Still thinks she's having some foot swelling, worse at the end of the day. Swelling is sometimes painful. Improves with elevation. But in general this is improved. CBC, prealbumin from previous visit was normal.  2. decreased appetite.  Could not make nutrition appointment due to being in court. States that she eats cabbage one day for one meal, asparagus another day for one meal, and salad another day for one meal. Continues this pattern every day. Does not eat anything else all day. Drinks Fuse juice to keep up with hydration. Agreeable to rescheduling nutrition appointment. prealbumin normal on most recent lab testing. Albumin normal prior to that.   Habits & Providers  Alcohol-Tobacco-Diet     Tobacco Status: current     Cigarette Packs/Day: 1.0  Current Medications (verified): 1)  Triamterene-Hctz 37.5-25 Mg  Caps (Triamterene-Hctz) .... Once Daily 2)  Prevacid 30 Mg  Cpdr (Lansoprazole) .... One Tab By Mouth Daily 3)  Flexeril 10 Mg  Tabs (Cyclobenzaprine Hcl) .... Once At Bedtime 4)  Synthroid 75 Mcg Tabs (Levothyroxine Sodium) .... One By Mouth Daily For Thyroid 5)  Oxycontin 20 Mg  Tb12 (Oxycodone Hcl) .... Po Two Times A Day 6)  Alprazolam 0.5 Mg  Tabs (Alprazolam) .... Take One Tablet Twice Daily As Needed For Anxiety 7)  Neurontin 100 Mg Caps (Gabapentin) .... Take 2 Tabs Three Times A Day 8)  Citalopram Hydrobromide 20 Mg Tabs  (Citalopram Hydrobromide) .... One By Mouth Daily For Depression 9)  Miralax  Powd (Polyethylene Glycol 3350) .... Take 17 G (One Capful) in 8 Oz Water 1-4 Times Per Day, Increase/decrease To One Soft Stool Daily; Disp 1 Bottle 10)  Lipitor 10 Mg Tabs (Atorvastatin Calcium) .... One By Mouth Daily  Allergies (verified): 1)  ! Codeine 2)  ! * Latex  Review of Systems       no chest pain or SOB. no nausea, vomting, diarrhea; occasional constipation. + decreased appetite.   Physical Exam  Additional Exam:  General:  alert, obese, pleasant, well-nourished Lungs:  work of breathing unlabored, no stridor, clear to auscultation bilaterally; no wheezes, rales, or ronchi; good air movement throughout Heart:  slightly tachy, no murmurs; normal s1/s2 Extremities:  no cyanosis, clubbing; mild BLE pitting edema - stable from previous exam Skin:  turgor normal, color normal, no rashes, and no suspicious lesions.   Psych:  Oriented X3, memory intact for recent and remote, good eye contact. Neuro: alert and oriented. speech normal. station and gait normal. no gross deficitis. Hearing grossly normal.   Impression & Recommendations:  Problem # 1:  EDEMA (ICD-782.3) Assessment Unchanged  stable. continue current medications. Follow.   Her updated medication list for this problem includes:    Triamterene-hctz 37.5-25 Mg Caps (Triamterene-hctz) ..... Once daily  Orders: [facility]-  Est Level  3 ([address])  Problem # 2:  LOSS OF APPETITE (ICD-783.0) Assessment: Unchanged  weight up 1 lb from previous visit; prealbumin normal which makes pt's dietary reports somewhat dubious. Nuttrition clinic visit will hopefully be helpful. Asked pt to keep a 3 day food log of everything she eats and drinks. She is agreeable. If her diary bears out her report, given normal labs and steady weight, I honestly don't have much to offer her as I don't know what the issue would be.   Orders: [facility]- Est Level  3  ([id number])  Complete Medication List: 1)  Triamterene-hctz 37.5-25 Mg Caps (Triamterene-hctz) .... Once daily 2)  Prevacid 30 Mg Cpdr (Lansoprazole) .... One tab by mouth daily 3)  Flexeril 10 Mg Tabs (Cyclobenzaprine hcl) .... Once at bedtime 4)  Synthroid 75 Mcg Tabs (Levothyroxine sodium) .... One by mouth daily for thyroid 5)  Oxycontin 20 Mg Tb12 (Oxycodone hcl) .... Po two times a day 6)  Alprazolam 0.5 Mg Tabs (Alprazolam) .... Take one tablet twice daily as needed for anxiety 7)  Neurontin 100 Mg Caps (Gabapentin) .... Take 2 tabs three times a day 8)  Citalopram Hydrobromide 20 Mg Tabs (Citalopram hydrobromide) .... One by mouth daily for depression 9)  Miralax Powd (Polyethylene glycol 3350) .... Take 17 g (one capful) in 8 oz water 1-4 times per day, increase/decrease to one soft stool daily; disp 1 bottle 10)  Lipitor 10 Mg Tabs (Atorvastatin calcium) .... One by mouth daily  Other Orders: [facility] ([id number])  Patient Instructions: 1)  reschedule your appointment in nutrition clinic. Make sure you keep a 3-day food diary prior to this appointment.  2)  follow-up with me in 2-3 months 3)  we'll let you know about your thyroid test   Prevention & Chronic Care Immunizations   Influenza vaccine: Not documented    Tetanus booster: 01/22/1999: Done.   Tetanus booster due: 01/21/2009    Pneumococcal vaccine: Not documented  Other Screening   Pap smear: ASCUS - no high risk HPV  (06/07/2008)   Pap smear due: 06/07/2009    Mammogram: Done.  (07/26/2004)   Mammogram due: 07/26/2005   Smoking status: current  (03/08/2009)  Lipids   Total Cholesterol: 201  (09/09/2008)   Lipid panel action/deferral: Lipid Panel ordered   LDL: 115  (09/09/2008)   LDL Direct: 94  (12/26/2008)   HDL: 33  (09/09/2008)   Triglycerides: 266  (09/09/2008)    SGOT (AST): 10  (12/26/2008)   SGPT (ALT): 10  (12/26/2008)   Alkaline phosphatase: 108  (12/26/2008)   Total bilirubin: 0.3   (12/26/2008)    Lipid flowsheet reviewed?: Yes   Progress toward LDL goal: At goal  Hypertension   Last Blood Pressure: 137 / 85  (03/08/2009)   Serum creatinine: 1.26  (12/26/2008)   Serum potassium 3.8  (12/26/2008)    Hypertension flowsheet reviewed?: Yes   Progress toward BP goal: At goal  Self-Management Support :   Personal Goals (by the next clinic visit) :      Personal blood pressure goal: 140/90  (09/09/2008)     Personal LDL goal: 130  (09/09/2008)    Hypertension self-management support: Written self-care plan  (03/08/2009)   Hypertension self-care plan printed.    Hypertension self-management support not done because: Good outcomes  (03/08/2009)    Lipid self-management support: Written self-care plan  (03/08/2009)   Lipid self-care plan printed.    Lipid self-management support not done because: Good outcomes  (  03/08/2009)  

## 2010-02-20 NOTE — Assessment & Plan Note (Signed)
Summary: abnormal labs/[place]   Vital Signs:  Patient profile:   50 year old female Height:      63 inches Weight:      217.8 pounds BMI:     38.72 Temp:     98.1 degrees F oral Pulse rate:   123 / minute BP sitting:   114 / 63  (left arm) Cuff size:   large  Vitals Entered By: Garen Grams LPN (June 23, 1608 11:23 AM) CC: f/u abnormal labs, Lipid Management Is Patient Diabetic? No Pain Assessment Patient in pain? yes     Location: back/labs   Primary Care Provider:  Myrtie Soman  MD  CC:  f/u abnormal labs and Lipid Management.  History of Present Illness: 1. abnormal lab Was called today to come in for abnormal labs. We have the copy from [facility] here -- one page which shows a Cr of 1.43 (baseline for the patient is  ~ 1.3) and a Hgb of 15.1. on triamterene/hctz but has not taken any other OTC nephrotoxic agents. Also with history of ankylosing spndylitis.  Pt states that she is at her baseline except that for the past month has had urinary frequency but difficulty voiding completely when she does go. States that she urinates about 2-3 times per hour. When she gets to the toilet has to "rock back and forth" to get the urine to come out. States that she drinks at least 1 gallon of water per day. 1/2 gallon of kool-aid as well.  fevers: no    chills: no    nausea: mild    vomiting: no    diarrhea: no       Lipid Management History:      Positive NCEP/ATP III risk factors include HDL cholesterol less than 40, current tobacco user, and hypertension.  Negative NCEP/ATP III risk factors include female age less than 71 years old.     Habits & Providers  Alcohol-Tobacco-Diet     Tobacco Status: current     Tobacco Counseling: to quit use of tobacco products  Current Medications (verified): 1)  Triamterene-Hctz 37.5-25 Mg  Caps (Triamterene-Hctz) .... Once Daily 2)  Prevacid 30 Mg  Cpdr (Lansoprazole) .... One Tab By Mouth Daily 3)  Flexeril 10 Mg  Tabs (Cyclobenzaprine Hcl)  .... Once At Bedtime 4)  Synthroid 75 Mcg Tabs (Levothyroxine Sodium) .... One By Mouth Daily For Thyroid 5)  Oxycontin 20 Mg  Tb12 (Oxycodone Hcl) .... Po Two Times A Day 6)  Alprazolam 0.5 Mg  Tabs (Alprazolam) .... Take One Tablet Twice Daily As Needed For Anxiety 7)  Neurontin 100 Mg Caps (Gabapentin) .... Take 2 Tabs By Mouth Two Times A Day 8)  Citalopram Hydrobromide 20 Mg Tabs (Citalopram Hydrobromide) .... One By Mouth Daily For Depression 9)  Miralax  Powd (Polyethylene Glycol 3350) .... Take 17 G (One Capful) in 8 Oz Water 1-4 Times Per Day, Increase/decrease To One Soft Stool Daily; Disp 1 Bottle 10)  Lipitor 10 Mg Tabs (Atorvastatin Calcium) .... One By Mouth Daily  Allergies (verified): 1)  ! Codeine 2)  ! * Latex  Review of Systems       review of systems as noted in HPI section   Physical Exam  General:  afebrile, tachycardic on vitals. normal rate on exam. Lungs:  work of breathing unlabored, clear to auscultation bilaterally; no wheezes, rales, or ronchi; good air movement throughout  Heart:  regular rate and rhythm, no murmurs; normal s1/s2  Abdomen:  +  BS, soft, non-tender, non-distended; no masses; no rebound or guarding; no CVAT; no suprapubic tenderness Psych:  anxious initially but better after our discussion.   Impression & Recommendations:  Problem # 1:  RENAL FAILURE, CHRONIC (ICD-585.9) baseline cr is around 1.3 so the elevation on her labs to 1.43 is not very remarkable. Not sure why she was told to come in so urgently. 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 She frequently provides convoluted and confusing histories so I'm not sure that her account of her fluid intake is accurate. Had 10 cc of voided urine and 10 cc on PVR; so this doesn't seem to add up.  For now will have the patient cut hctz/triamterene in half and follow-up in one week to recheck BMP. Asked her to drink more fluids than she currently  is. Has diagnosis of ankylosing spondylitis but no evidence of Reiter's syndrome at this time.  Orders: Urinalysis-[facility] (00000) [facility]- Est Level  3 ([address])  Complete Medication List: 1)  Triamterene-hctz 37.5-25 Mg Caps (Triamterene-hctz) .... Once daily 2)  Prevacid 30 Mg Cpdr (Lansoprazole) .... One tab by mouth daily 3)  Flexeril 10 Mg Tabs (Cyclobenzaprine hcl) .... Once at bedtime 4)  Synthroid 75 Mcg Tabs (Levothyroxine sodium) .... One by mouth daily for thyroid 5)  Oxycontin 20 Mg Tb12 (Oxycodone hcl) .... Po two times a day 6)  Alprazolam 0.5 Mg Tabs (Alprazolam) .... Take one tablet twice daily as needed for anxiety 7)  Neurontin 100 Mg Caps (Gabapentin) .... Take 2 tabs by mouth two times a day 8)  Citalopram Hydrobromide 20 Mg Tabs (Citalopram hydrobromide) .... One by mouth daily for depression 9)  Miralax Powd (Polyethylene glycol 3350) .... Take 17 g (one capful) in 8 oz water 1-4 times per day, increase/decrease to one soft stool daily; disp 1 bottle 10)  Lipitor 10 Mg Tabs (Atorvastatin calcium) .... One by mouth daily  Lipid Assessment/Plan:      Based on NCEP/ATP III, the patient's risk factor category is "2 or more risk factors and a calculated 10 year CAD risk of < 20%".  The patient's lipid goals are as follows: Total cholesterol goal is 200; LDL cholesterol goal is 130; HDL cholesterol goal is 40; Triglyceride goal is 150.    Patient Instructions: 1)  cut your fluid pill in half for now 2)  drink more water than you have been 3)  follow-up in one week for [place] to recheck your labs 4)  I don't think you have anything to be concerned about. Prescriptions: LIPITOR 10 MG TABS (ATORVASTATIN CALCIUM) one by mouth daily  #30.0 Each x 2   Entered and Authorized by:   Myrtie Soman  MD   Signed by:   Myrtie Soman  MD on 06/23/2009   Method used:   Electronically to        [facility] [address] [phone]* (retail)       [address] [place], [place]  [address]        Ph: [phone]       Fax: [phone]   RxID:   [id number]   Laboratory Results   Urine Tests  Date/Time Received: June 23, 2009 11:44 AM   Routine Urinalysis   Color: light yellow Appearance: Clear Glucose: negative   (Normal Range: Negative) Bilirubin: negative   (Normal Range: Negative) Ketone: negative   (Normal Range: Negative) Spec. Gravity:  1.015   (Normal Range: 1.003-1.035) Blood: negative   (Normal Range: Negative) pH: 7.0   (Normal Range: 5.0-8.0) Protein: negative   (Normal Range: Negative) Urobilinogen: 0.2   (Normal Range: 0-1) Nitrite: negative   (Normal Range: Negative) Leukocyte Esterace: small   (Normal Range: Negative)  Urine Microscopic WBC/HPF: 5-10 Bacteria/HPF: 3+ Epithelial/HPF: >20    Comments: ...............test performed by......Marland KitchenBonnie A. [place], MLS (ASCP)cm

## 2010-02-20 NOTE — Letter (Signed)
Summary: Probation Letter  [facility]  [address]   [place], [place] [address]   Phone: [phone]  Fax: [phone]    06/26/2009  MAYLIE ASHTON [address] [place], [place]  [address]  Dear [name],  With the goal of better serving all our patients the [facility] is following each patient's missed appointments.  You have missed at least 3 appointments with our practice.If you cannot keep your appointment, we expect you to call at least 24 hours before your appointment time.  Missing appointments prevents other patients from seeing [place] and makes it difficult to provide you with the best possible medical care.      1.   If you miss one more appointment, we will only give you limited medical services. This means we will not call in medication refills, complete a form, or make a referral for you except when you are here for a scheduled office visit.    2.   If you miss 2 or more appointments in the next year, we will dismiss you from our practice.    Our office staff can be reached at [phone] Monday through Friday from 8:30 a.m.-5:00 p.m. and will be glad to schedule your appointment as necessary.    Thank you.   [facility]

## 2010-02-20 NOTE — Progress Notes (Signed)
Summary: triage  Phone Note Call from Patient Call back at Home Phone [phone]   Caller: Patient Summary of Call: dizzy- sick to stomach Initial call taken by: De Nurse,  Jun 09, 2009 10:12 AM  Follow-up for Phone Call        has not vomited. has been getting worse over the past few days. 3:30 appt so her spouse can drive her here. she is aware that her pcp is not here Follow-up by: [place] RN,  Jun 09, 2009 10:25 AM

## 2010-02-20 NOTE — Progress Notes (Signed)
Summary: resch  Phone Note Call from Patient Call back at Home Phone [phone]   Caller: Patient Summary of Call: pt is sick with the flu and had to resch appt- resch to 2/16 Initial call taken by: De Nurse,  March 03, 2009 2:10 PM

## 2010-02-20 NOTE — Letter (Signed)
Summary: Generic Letter  [facility]  [address]   [place], [place] [address]   Phone: [phone]  Fax: [phone]    09/07/2009  [name] [address] [place], [place]  [address]  Dear [name],  We have scheduled an appointment for you with [facility] for October 02, 2009 at 1:15pm. They are located on [address] [place] [address]. Their phone number is [phone] should you need to reschedule. We tried to reach you by phone but the number we have in our records has been disconnected, please contact our office at your earliest convience with a working phone number. Also if you have any questions please feel free to contact our office.         Sincerely,   Garen Grams LPN for [name] DO

## 2010-02-20 NOTE — Assessment & Plan Note (Signed)
Summary: issues,df   Vital Signs:  Patient profile:   50 year old female Weight:      216.4 pounds Temp:     97 degrees F oral Pulse rate:   97 / minute BP sitting:   131 / 93 Cuff size:   large  Vitals Entered By: Loralee Pacas CMA (January 26, 2009 11:37 AM)  Primary Care Provider:  Myrtie Soman  MD   History of Present Illness: 1. decreased appetite States she hasn't eaten anything since 12/25 except a bowl of soup which didn't stay down. Has been drinking fluids okay. Thinks the appetite problems are related to an rx for hydrocodone that was prescribed 12/27 by Dr. Valentina Gu at [facility]. States she's allergic to this but took it anyway.  -when asked to be specific, but adamantly states that she's not a had " a thing" to eat since 12/25 except the bowl of soup.  note has gained 3 lbs since last visit 12/29/08  2. swelling Feels like she's been swelling for the last 3 weeks. Mostly in lower abdomen and legs. Elevated her legs at night and during the day helps. feels like she's urinating less than normal (urine is clear when she does go).   3. ? disorientation from Effexor stopped taking depression medication 3 days ago because she was feeling "disoriented", restarted yesterday. Of note, states that she was out of her oxycontin and xanax for about 3 weeks but just got the prescription filled 12/31 (per Dr. Corliss Skains). States she's had trouble knowing what day it is when she wakes up. Once someone tells her what day it is she remembers. Happened once last week and again this week. Says she also hears "crickets in her head."   Allergies: 1)  ! Codeine 2)  ! * Latex  Review of Systems       pan-positive review of systems  Physical Exam  Additional Exam:  General:  alert, obese, pleasant, well-nourished Eyes:  EOMI, sclerea clear Neck: no thyromegaly or carotid stenosis Lungs:  work of breathing unlabored, no stridor, clear to auscultation bilaterally; no wheezes, rales, or ronchi;  good air movement throughout Heart:  slightly tachy, no murmurs; normal s1/s2 Extremities:  no cyanosis, clubbing; mild BLE pitting edema - stable from previous exam Skin:  turgor normal, color normal, no rashes, and no suspicious lesions.   Psych:  Oriented X3, memory intact for recent and remote, good eye contact. Neuro: alert and oriented. speech normal. station and gait normal. no gross deficitis. Hearing grossly normal.   Impression & Recommendations:  Problem # 1:  LOSS OF APPETITE (ICD-783.0) Assessment New Pt weight is up 3 lbs. Mild LE edema is stable. Frankly I don't believe the patient's report of not eating anything since December 25th (almost 2 weeks). She reported a 10-year 1000 caloried diet at last visit. Has nutrtion appointment upcoming next week. Check pre-albumin, sed rate. My interactions with Ms. Escalante persistently take on a tone of embellishment and hyperbole and it continues to be difficult to put her histories into a concrete clinical context.   Orders: Miscellaneous Lab Charge-[facility] [id number]) Sed Rate (ESR)-[facility] [phone]) [facility]- Est  Level 4 (14782)  Problem # 2:  EDEMA (ICD-782.3) Assessment: Deteriorated This is stable to my exam but worse subjectively for the patient. Check UA for protein. Elevate legs as much as possible. Do not think an extensive systemic work-up is merited given pt's unreliable history.  Her updated medication list for this problem includes:  Triamterene-hctz 37.5-25 Mg Caps (Triamterene-hctz) ..... Once daily  Orders: Urinalysis-[facility] (00000) CBC w/Diff-[facility] (16109) [facility]- Est  Level 4 (60454)  Problem # 3:  DEPRESSION, MAJOR, RECURRENT (ICD-296.30) Assessment: Unchanged  Pt reports feeling disoriented a couple time over the past 2 weeks. Again, I don't really know what to make of this. It may be simple grogginess with awakening. She's also on xanax, oxycontin, and has been on hydrocodone which she doesn't tolerate very well. These may contribute  as well. Not disoriented today. She feels like citalopram was a better medicine for her and I am willing to switch back to that. Follow.  Orders: [facility]- Est  Level 4 (09811)  Complete Medication List: 1)  Triamterene-hctz 37.5-25 Mg Caps (Triamterene-hctz) .... Once daily 2)  Prevacid 30 Mg Cpdr (Lansoprazole) .... One tab by mouth daily 3)  Flexeril 10 Mg Tabs (Cyclobenzaprine hcl) .... Once at bedtime 4)  Synthroid 75 Mcg Tabs (Levothyroxine sodium) .... One by mouth daily for thyroid 5)  Oxycontin 20 Mg Tb12 (Oxycodone hcl) .... Po two times a day 6)  Alprazolam 0.5 Mg Tabs (Alprazolam) .... Take one tablet twice daily as needed for anxiety 7)  Neurontin 100 Mg Caps (Gabapentin) .... Take 2 tabs three times a day 8)  Citalopram Hydrobromide 20 Mg Tabs (Citalopram hydrobromide) .... One by mouth daily for depression 9)  Miralax Powd (Polyethylene glycol 3350) .... Take 17 g (one capful) in 8 oz water 1-4 times per day, increase/decrease to one soft stool daily; disp 1 bottle 10)  Lipitor 10 Mg Tabs (Atorvastatin calcium) .... One by mouth daily  Patient Instructions: 1)  follow-up in nutrition clinic 2)  we'll let you know about the lab results; the pre-albumin will let us know about your nutritional status. 3)  keep your feet elevated as much as possible. 4)  start the citalopram once a day; stop the effexor. 5)  Dr. Corliss Skains did your gall bladder surgery. Prescriptions: CITALOPRAM HYDROBROMIDE 20 MG TABS (CITALOPRAM HYDROBROMIDE) one by mouth daily for depression  #30 x 1   Entered and Authorized by:   Myrtie Soman  MD   Signed by:   Myrtie Soman  MD on 01/26/2009   Method used:   Electronically to        [facility] [address] [phone]* (retail)       [address] [place]  [address]       Ph: [phone]       Fax: [phone]   RxID:   [id number] TRIAMTERENE-HCTZ 37.5-25 MG  CAPS (TRIAMTERENE-HCTZ) once daily  #90 x 2   Entered and Authorized by:   Myrtie Soman  MD   Signed by:   Myrtie Soman  MD on 01/26/2009   Method used:   Electronically to        [facility] [address] [phone]* (retail)       [address] [place], [place]  [address]       Ph: [phone]       Fax: [phone]   RxID:   [id number]   Laboratory Results   Urine Tests  Date/Time Received: January 26, 2009 12:15 PM   Routine Urinalysis   Color: yellow Appearance: Clear Glucose: negative   (Normal Range: Negative) Bilirubin: negative   (Normal Range: Negative) Ketone: trace (5)   (Normal Range: Negative) Spec. Gravity: 1.020   (Normal Range: 1.003-1.035) Blood: trace-intact   (Normal  Range: Negative) pH: 5.5   (Normal Range: 5.0-8.0) Protein: negative   (Normal Range: Negative) Urobilinogen: 0.2   (Normal Range: 0-1) Nitrite: negative   (Normal Range: Negative) Leukocyte Esterace: trace   (Normal Range: Negative)  Urine Microscopic WBC/HPF: >20 RBC/HPF: 1-5 Bacteria/HPF: 2+ rods & cocci Epithelial/HPF: 5-15 Other: few clue cells

## 2010-02-20 NOTE — Miscellaneous (Signed)
Summary: abnormal labs  Clinical Lists Changes rec'd faxed report from [facility]. has abnormalities. has missed last 2 visits. appt with pcp at 11am today.Marland KitchenMarland KitchenGolden Circle RN  June 23, 2009 9:50 AM   to Evonnie Pat, Chiropodist about her multiple [facility].Golden Circle RN  June 23, 2009 9:50 AM

## 2010-03-18 ENCOUNTER — Other Ambulatory Visit: Payer: Self-pay | Admitting: Family Medicine

## 2010-03-19 NOTE — Telephone Encounter (Signed)
Refill request

## 2010-03-19 NOTE — Telephone Encounter (Signed)
Please review and refill

## 2010-03-22 ENCOUNTER — Other Ambulatory Visit: Payer: Self-pay | Admitting: Family Medicine

## 2010-03-22 NOTE — Telephone Encounter (Signed)
Refill request

## 2010-05-08 ENCOUNTER — Other Ambulatory Visit: Payer: Self-pay | Admitting: Family Medicine

## 2010-05-08 ENCOUNTER — Ambulatory Visit: Payer: Self-pay | Admitting: Family Medicine

## 2010-05-08 NOTE — Telephone Encounter (Signed)
Refill request

## 2010-05-18 ENCOUNTER — Ambulatory Visit (INDEPENDENT_AMBULATORY_CARE_PROVIDER_SITE_OTHER): Payer: BC Managed Care – PPO | Admitting: Family Medicine

## 2010-05-18 ENCOUNTER — Encounter: Payer: Self-pay | Admitting: Family Medicine

## 2010-05-18 DIAGNOSIS — E669 Obesity, unspecified: Secondary | ICD-10-CM

## 2010-05-18 DIAGNOSIS — E559 Vitamin D deficiency, unspecified: Secondary | ICD-10-CM

## 2010-05-18 DIAGNOSIS — I10 Essential (primary) hypertension: Secondary | ICD-10-CM

## 2010-05-18 DIAGNOSIS — F17200 Nicotine dependence, unspecified, uncomplicated: Secondary | ICD-10-CM

## 2010-05-18 DIAGNOSIS — E7800 Pure hypercholesterolemia, unspecified: Secondary | ICD-10-CM

## 2010-05-18 DIAGNOSIS — M999 Biomechanical lesion, unspecified: Secondary | ICD-10-CM | POA: Insufficient documentation

## 2010-05-18 DIAGNOSIS — M9981 Other biomechanical lesions of cervical region: Secondary | ICD-10-CM

## 2010-05-18 DIAGNOSIS — E039 Hypothyroidism, unspecified: Secondary | ICD-10-CM

## 2010-05-18 LAB — LDL CHOLESTEROL, DIRECT: Direct LDL: 151 mg/dL — ABNORMAL HIGH

## 2010-05-18 MED ORDER — CALCIUM-VITAMIN D 500-200 MG-UNIT PO TABS: 2.0000 | ORAL_TABLET | Freq: Two times a day (BID) | ORAL | Status: DC

## 2010-05-18 NOTE — Assessment & Plan Note (Signed)
Should check again at this time. Will get direct LDL

## 2010-05-18 NOTE — Assessment & Plan Note (Signed)
Pt made quit date of her birthday and will decrease on her way to quitting.

## 2010-05-18 NOTE — Assessment & Plan Note (Signed)
Pt is at goal today and will continue current therapy.

## 2010-05-18 NOTE — Assessment & Plan Note (Signed)
Pt has gained weight every visit since 2008.  Pt will have thyroid checked, encouraged pt to watch diet, gave pt option to see nutritionist. Encouraged pt to increase activity. Will have pt return in 2-3 months to see how pt is doing

## 2010-05-18 NOTE — Assessment & Plan Note (Signed)
After verbal consent pt did have HVLA, with marked improvement.  Gave side effects to look out for and can take anti inflammatories in the acute time frame.   

## 2010-05-18 NOTE — Assessment & Plan Note (Signed)
Pt is due for TSH check and will adjust medication accordingly.

## 2010-05-18 NOTE — Progress Notes (Signed)
  Subjective:    Patient ID: [name], female    DOB: 12-17-1960, 50 y.o.   MRN: [id number]  HPI 1. Hypertension Blood pressure at home: Blood pressure today: 133/89 Taking Meds:yes Side effects:no ROS: Denies headache visual changes nausea, vomiting, chest pain or abdominal pain or shortness of breath.  Obesity-  Pt has gained 40# since 2008 has been on steroids a lot of the time due to RA and ankylosing spondylitis. Pt   Lipids-  Pt is on Lipitor tolerating well, pt needs her cholesterol checked.   Hypothyroidism- pt has continued to gain weight and not feeling great very fatigued has not has lab values done for sometime. No swelling  Smoking-  Pt still smoking 1ppd, does want to quit, pt has had been down to 1/2ppd a couple months ago but has had some stress. Pt would like to quit soon.   Gerd/Gastroparesis-  On Reglan, doing better overall, no side effects form medication at this time.   Hx of Vit D deficiency-  Pt would be interested in having it checked again.   Low potassium-  Was told that last time she had her labs drawn by her rheumatologist. Pt would like it rechecked.  No muscle spasm no nausea or vomiting.     Review of Systems Denies fever, chills, nausea vomiting abdominal pain, dysuria, chest pain, shortness of breath dyspnea on exertion or numbness in extremities     Objective:   Physical Exam General Appearance:    Alert, cooperative, no distress, appears stated age obese  Head:    Normocephalic, without obvious abnormality, atraumatic  Eyes:    PERRL, conjunctiva/corneas clear, EOM's intact  Throat:   Lips, mucosa, and tongue normal; teeth and gums normal  Neck:   Supple, symmetrical, trachea midline, no adenopathy;    thyroid:  no enlargement/tenderness/nodules;   Back:     Symmetric, no curvature, ROM normal, multiple tender points  Lungs:     Clear to auscultation bilaterally, respirations unlabored, coarse.    Heart:    Regular rate and rhythm, S1  and S2 normal, no murmur, rub   or gallop  Abdomen:     Soft, non-tender, bowel sounds active all four quadrants,    no masses, no organomegaly  Extremities:   Extremities normal, atraumatic, no cyanosis or edema  Pulses:   2+ and symmetric all extremities        OMT Findings: Thoracic T5 rotated and side bent right    Assessment & Plan:

## 2010-05-18 NOTE — Patient Instructions (Addendum)
Nice to see you We will get some labs today I want you to take calcium and Vitamin D daily. You can get this over the counter. Try walking at least 30 minutes most days of the week Do the exercise I told for your back I think this will really help Try to keep a food dairy and we can go over it Stop smoking your quit date is your birthday in [birth year] I want to see you again in 6-8 weeks.

## 2010-05-18 NOTE — Assessment & Plan Note (Signed)
Vitamin d was low will recheck.

## 2010-05-19 LAB — COMPREHENSIVE METABOLIC PANEL
ALT: 14 U/L (ref 0–35)
Albumin: 3.9 g/dL (ref 3.5–5.2)
CO2: 24 mEq/L (ref 19–32)
Calcium: 9.3 mg/dL (ref 8.4–10.5)
Chloride: 98 mEq/L (ref 96–112)
Potassium: 4 mEq/L (ref 3.5–5.3)
Sodium: 137 mEq/L (ref 135–145)
Total Bilirubin: 0.3 mg/dL (ref 0.3–1.2)
Total Protein: 6.4 g/dL (ref 6.0–8.3)

## 2010-05-19 LAB — TSH: TSH: 5.14 u[IU]/mL — ABNORMAL HIGH (ref 0.350–4.500)

## 2010-05-21 ENCOUNTER — Telehealth: Payer: Self-pay | Admitting: Family Medicine

## 2010-05-21 MED ORDER — VITAMIN D (ERGOCALCIFEROL) 1.25 MG (50000 UNIT) PO CAPS: 50000.0000 [IU] | ORAL_CAPSULE | ORAL | Status: AC

## 2010-05-21 MED ORDER — LEVOTHYROXINE SODIUM 100 MCG PO TABS: 100.0000 ug | ORAL_TABLET | Freq: Every day | ORAL | Status: DC

## 2010-05-21 NOTE — Telephone Encounter (Signed)
Called and LM for pt that would change her synthroid and will do Vitamin D again, will recheck TSH again in 3 months.

## 2010-05-27 ENCOUNTER — Other Ambulatory Visit: Payer: Self-pay | Admitting: Family Medicine

## 2010-05-28 NOTE — Telephone Encounter (Signed)
Refill request

## 2010-05-29 ENCOUNTER — Other Ambulatory Visit: Payer: Self-pay | Admitting: Family Medicine

## 2010-05-29 DIAGNOSIS — Z1231 Encounter for screening mammogram for malignant neoplasm of breast: Secondary | ICD-10-CM

## 2010-06-05 ENCOUNTER — Encounter: Payer: Self-pay | Admitting: Sports Medicine

## 2010-06-05 NOTE — Discharge Summary (Signed)
[name], [name]                 ACCOUNT NO.:  [id number]   MEDICAL RECORD NO.:  [id number]          PATIENT TYPE:  INP   LOCATION:  3037                         FACILITY:  [facility]   PHYSICIAN:  Stefani Dama, M.D.  DATE OF BIRTH:  10/16/1960   DATE OF ADMISSION:  04/21/2007  DATE OF DISCHARGE:  04/23/2007                               DISCHARGE SUMMARY   ADMITTING DIAGNOSES:  Lumbar spondylosis and stenosis, L4-L5 with lumbar  radiculopathy and spondylolisthesis.   DISCHARGE DIAGNOSES:  Spondylolisthesis, L4-L5 with lumbar spinal  stenosis and lumbar radiculopathy.   CONDITION ON DISCHARGE:  Improving.   HOSPITAL COURSE:  The patient is a 50 year old individual who has had  significant lumbar radiculopathy and an L4-L5 distribution.  She had  spondylolisthesis at L4-L5 with stenosis.  She had previous fusion at L5-  S1 done by me many years ago.  She was now taken to the operating room  where she underwent surgical decompression of L4-L5 with posterior  interbody arthrodesis.  She tolerated her procedure well on April 21, 2007, was mobilized on the second postoperative day.  She has required  significant pain management and medications which she had been on  preoperatively.  These were continued during the postoperative.  At the  time of discharge, she was maintained on her baseline pain medications.  Her incision was clean and dry.  She has been advised to follow up in  the office in approximately three weeks' time.   CONDITION ON DISCHARGE:  Stable.      Stefani Dama, M.D.  Electronically Signed     HJE/MEDQ  D:  06/04/2007  T:  06/05/2007  Job:  [id number]

## 2010-06-05 NOTE — Op Note (Signed)
[name], [name]                 ACCOUNT NO.:  [id number]   MEDICAL RECORD NO.:  [id number]          PATIENT TYPE:  INP   LOCATION:  3172                         FACILITY:  [facility]   PHYSICIAN:  Stefani Dama, M.D.  DATE OF BIRTH:  04/09/1960   DATE OF PROCEDURE:  04/21/2007  DATE OF DISCHARGE:                               OPERATIVE REPORT   PREOPERATIVE DIAGNOSIS:  Lumbar spondylosis with spondylolisthesis at L4-  L5 and L4-L5 radiculopathy, neurogenic claudication.   POSTOPERATIVE DIAGNOSIS:  Lumbar spondylosis with spondylolisthesis at  L4-L5 and L4-L5 radiculopathy, neurogenic claudication.   PROCEDURES:  L4 laminectomy, decompression of L4 and L5 nerve roots  bilaterally, posterior lumbar interbody fusion with local autograft,  allograft, PEEK spacers, pedicle screw fixation L4-L5 with Alphatec  instrumentation, posterolateral arthrodesis with local autograft and  allograft L4-L5.   SURGEON:  Dr. Danielle Dess.   FIRST ASSISTANT:  Dr. Maeola Harman.   ANESTHESIA:  General endotracheal.   INDICATIONS:  [name] is a 50 year old individual who has had  significant problems with back pain, bilateral lower extremity pain.  Years gone by she underwent decompression and fusion at L5-S1, which I  did some 15 or more years ago.  She has done well with her back until  the last couple of years, where she has had increasing back pain and leg  pain.   PROCEDURE:  The patient was brought to the operating room supine on the  stretcher.  After smooth induction of general endotracheal anesthesia,  she was turned prone.  The back was prepped with alcohol and DuraPrep  and draped in a sterile fashion.  A midline incision was created and  carried down to lumbodorsal fascia, which was opened on either side of  the midline to expose the spinous processes of L4-L5.  These were marked  and initially it was noted that we marked L4 and L3 and with this  verification of L4 was obtained positively.   Laminectomy of L4 was then  undertaken, removing up to and including the facet joints at L4-L5.  There was noted be a substantial overgrowth of the facets with a large  right-sided synovial cyst.  On the left side there was hypertrophy and  derangement of the facet joint such that there was lateral recess  stenosis for the L5 nerve root inferiorly.  The L4 nerve root appeared  to be very snug in its travel out the foramen.  There was a syncytium of  what was felt to be scar tissue within the epidural venous plexus  overlying the dura and this had to carefully dissected as it was densely  adherent to do the dura.  Ultimately, however, it could be mobilized and  a large epidural veins in this area were cauterized and divided and the  disk space was exposed.  A diskectomy was then performed first on the  right side, using a 16 blade to open the posterior longitudinal ligament  and using a series of Kerrison rongeurs to evacuate the disk space  completely.  With the disk space being opened and evacuated, the area  was prepared for grafting with the use of a series of disk shavers and a  series of curettes to remove the vertebral end plates.  The interspace  was sized and an 8-mm spacer was placed.  The opposite side then  underwent diskectomy and removal of the disk and the vertebral end  plates, also allowing for some further distraction of the disk space.  Once this was accomplished, hemostasis in the soft tissues was obtained  and fluoroscopy was used to place pedicle screws at L4 and L5.  These  were each individually sounded and a sample of the bone marrow aspirate  from the vertebral body of L5 was obtained after the pedicle probes were  passed.  This was mixed with 10 mL of Fortaz.  Then 10 mm of PEEK  spacers were prepared by packing them with the [place].  Once the pedicle  screws were placed and verified, they were noted to be all the same size  6.5 x 45 mm pedicle screws.  Then with  fluoroscopic guidance the PEEK  spacers were placed in the interspace and countersunk appropriately.  The remainder of the Fortaz and autograft was placed into the interspace  and this was packed as best possible.  A short 35-mm rods were placed  between the pedicle screws and these were secured in a neutral position.  A transverse connector was applied to maintain stability and prevent any  lateral listhesis of the construct.  The posterolateral gutters which  had previously been prepared by decorticating them with the Cobb  elevator were packed with local autograft and allograft, completing the  posterolateral portion of the fusion.  The area was inspected for  hemostasis in the soft tissues and then the lumbodorsal fascia was  closed with #1 Vicryl in interrupted fashion, 2-0 Vicryl used in the  subcutaneous tissues, and 3-0 Vicryl subcuticularly.  Care was taken to  reapproximate the edges of the tattoo that the patient had on her back.  A dry sterile dressing was applied, carefully padding the incision.  The  patient was then returned to the recovery room in stable condition.  Blood loss estimated at about 250 mL.      Stefani Dama, M.D.  Electronically Signed     HJE/MEDQ  D:  04/21/2007  T:  04/21/2007  Job:  [id number]

## 2010-06-06 ENCOUNTER — Ambulatory Visit: Payer: BC Managed Care – PPO
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
## 2010-06-08 NOTE — Op Note (Signed)
NAMEADAMARIS, KING                 ACCOUNT NO.:  [id number]   MEDICAL RECORD NO.:  [id number]          PATIENT TYPE:  AMB   LOCATION:  SDS                          FACILITY:  [facility]   PHYSICIAN:  Wilmon Arms. Corliss Skains, M.D. DATE OF BIRTH:  1960-03-18   DATE OF PROCEDURE:  12/19/2005  DATE OF DISCHARGE:                               OPERATIVE REPORT   PREOPERATIVE DIAGNOSIS:  Chronic calculus cholecystitis.   POSTOPERATIVE DIAGNOSIS:  Chronic calculus cholecystitis.   PROCEDURE PERFORMED:  Laparoscopic cholecystectomy with intraoperative  cholangiogram.   SURGEON:  Wilmon Arms. Corliss Skains, M.D.   ASSISTANT:  Anselm Pancoast. Zachery Dakins, M.D.   ANESTHESIA:  General endotracheal.   INDICATIONS:  The patient is a 50 year old female with lots of chronic  medical problems and chronic pain who presents with a 2-year history of  intermittent nausea and vomiting associated with the right upper  quadrant pain.  This gets worse when she eats.  An ultrasound showed  evidence of gallstones.  She was then referred for surgical evaluation.  We recommended laparoscopic cholecystectomy.   DESCRIPTION OF PROCEDURE:  The patient brought to the operating room and  placed in supine position operating table.  After an adequate level of  general anesthesia was obtained, the patient's abdomen was prepped with  Betadine and draped in sterile fashion.  Her umbilicus was infiltrated  with 0.25%Marcaine.  A transverse incision was made just below  umbilicus.  Dissection was carried down to the fascia.  The fascia was  opened vertically and the peritoneal cavity was bluntly entered.  A stay  suture of 0 Vicryl was placed around the fascial opening.  The Hasson  cannula was inserted and secured with the stay suture.  Pneumoperitoneum  is obtained by insufflating CO2 maintaining maximal pressure of 15 mmHg.  The laparoscope was inserted.  There seemed to be a line of omental  adhesions to the right of midline.  The left  upper quadrant appeared  clear and the stomach appeared normal.  Left lobe liver appeared normal.  We were able to find a small hole in the omental adhesions and into the  right upper quadrant.  This area also appeared clear to the right of the  omental adhesions.  Subxiphoid port was placed under direct vision.  Two  5 mL mm ports were also placed.  The gallbladder was identified.  There  were dense omental adhesions to the surface of the gallbladder.  These  were taken down with cautery.  The gallbladder was fairly large and  contained stones.  This was retracted superiorly over the edge of the  liver with clamp.  We opened the peritoneum around the hilum of  gallbladder.  Cystic artery and cystic duct were both identified.  Cystic artery was ligated with clips.  The cystic duct was ligated,  clipped distally.  A small opening was created on the cystic duct.  A  Cook cholangiogram catheter was inserted through a stab incision  threaded into the cystic duct.  This was secured with a clip.  The  cholangiogram was obtained which showed a short  cystic duct and contrast  flowing into both sides of the common bile duct.  Contrast also flowed  freely into the duodenum with no sign of filling defect or obstruction.  The catheter was removed and the cystic duct was ligated.  The cystic  artery was also divided.  An additional branch of cystic artery was  ligated with a clip.  Cautery was then used to remove the gallbladder  from the liver.  This was placed in EndoCatch sac.  We irrigated the  right upper quadrant.  Hemostasis was obtained with cautery.  No further  bleeding was noted in the gallbladder was removed through the umbilical  port site.  The fascia  was closed with a pursestring suture.  4-0 Monocryl was used to close  skin incisions.  Steri-Strips and clean dressings were applied.  The  patient was extubated and brought to recovery in stable condition.  All  sponge, instrument,  needle counts correct.      Wilmon Arms. Tsuei, M.D.  Electronically Signed     MKT/MEDQ  D:  12/19/2005  T:  12/19/2005  Job:  [id number]

## 2010-06-08 NOTE — Op Note (Signed)
[place]. [facility]  Patient:    [name], [name]                        MRN: [id number] Proc. Date: 09/17/99 Adm. 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  OPERATION:  Anterior cervical diskectomy and arthrodesis, C4-5, structural allograft, Synthes fixation, removal of old hardware C5 to C7.  SURGEON:  Stefani Dama, M.D.  FIRST ASSISTANT:  Danae Orleans. Venetia Maxon, M.D.  ANESTHESIA:  General endotracheal.  INDICATIONS:  The patient is a 50 year old individual who has had significant cervical radiculopathy in the past having undergone a fusion from C5 to C7. She has developed new myelopathic symptoms with evidence of cord compression at C4-5 with a large disk and underlying spondylolytic disease.  DESCRIPTION OF PROCEDURE:  The patient was brought to the operating room, placed on the table in the supine position.  After the smooth induction of general endotracheal anesthesia, she was placed in five pounds of Holter traction.  The neck was prepped with DuraPrep and draped in a sterile fashion. A transverse incision was on the left side of the neck and carried down to the platysma.  The plane between the sternocleidomastoid and the strap muscles was dissected bluntly until the prevertebral space was reached.  First identifiable disk space was noted to be that at C4-5 on a radiograph.  The underlying plate was then uncovered and this was removed with all but one screw with the lower screw on the left hand side noted to be fractured off. Once the plate was removed the base of the screw could be removed without difficulty.  The arthrodesis from C5 to C7 appeared solid.  C4-5, however, appeared to be markedly degenerated.  The disk space was opened ventrally with a 15 blade and then a  combination of curets and rongeurs were used to remove the bulk of the disk space.  The osteophytes were primary off the inferior portion of the body of C4 and these were drilled down with the Midas Rex and A2 bur.  The lateral recesses were then decompressed, again using the drill, and then lifting the large amount of degenerated disk from behind the region of the body of C4.  It was allowed for good decompression of the common dural tube which by this time was exposed by removing the degenerated posterior longitudinal ligament that was in this region.  Once the area was well decompressed, hemostasis from some epidural veins, particularly in the lateral recesses, was obtained with some small Gelfoam pledget soaked in thrombin. These were later removed.  The area was copiously irrigated with antibiotic irrigating solution.  A round fibular graft was placed in the interspace measuring 7 mm in height.  The traction was removed, the neck was placed in slight flexion and an 18 mm Synthes plate was affixed with four locking 4 x 16 mm screws.  Radiographic conformation of the graft position and plate position was obtained.  The area was then checked for hemostasis and the platysma was closed with 3-0 Vicryl in interrupted fashion and 3-0 Vicryl was used subcuticularly.  The patient tolerated the procedure well. DD:  09/17/99 TD:  09/18/99 Job: [id number]

## 2010-06-08 NOTE — H&P (Signed)
[place]. [facility]  Patient:    [name], [name]                       MRN: [id number] Adm. Date:  09/17/99 Attending:  Stefani Dama, M.D.                         History and Physical  ADMITTING DIAGNOSES:  Cervical spondylosis and herniated nucleus pulposus with myelopathy C4-5.  HISTORY OF PRESENT ILLNESS:  The patient is a 50 year old individual who has had significant back problems and cervical spondylosis in the past having undergone anterior diskectomy and arthrodesis at C5-6 and C6-7 in 1998. She did well for a period of time; however, over the past month she has developed increasing pain in her neck, shoulder and arm. Motor strength has been good, but she has been having giving way sensations of the strength in her arms. She has also had some radiation and deadening sensation of pain in the right arm. She also has had some weakness in the left arm that happens episodically. She also notes that her left arm will be come discolored and almost dark at times. Her workup included an MRI that showed a prominent disk herniation on top of spondylitic disease at C4-5 causing cord compression and more eccentric off to the right side. After careful consideration of her options she was advised regarding cervical decompression and stabilization of C4-5. She is now admitted for this procedure.  PAST MEDICAL HISTORY: 1. Severe trauma in the early 90s. 2. Difficulty with her knees. 3. Back surgery in 1991 with fusion at L5-S1. 4. She was recently informed that she has ankylosing spondylitis. She has been    followed by Dr. Corliss Skains her rheumatologist and they are considering doing    knee replacement surgery on her right knee.  CURRENT MEDICATIONS: 1. Sulfasalazine 500 mg two tablets twice daily. 2. Celebrex 200 mg a day. 3. OxyContin 20 mg twice a day. 4. Celexa 20 mg a day. 5. Synthroid 0.025 mg daily. 6. Flexeril 10 mg daily. 7. Prevacid 30 mg  daily.  SOCIAL HISTORY:  She smokes a pack of cigarettes a day. She drinks alcohol socially. Her height and weight have been stable at 140 pounds and 5 feet 4 inches.  PHYSICAL EXAMINATION:  GENERAL:  An alert and cooperative individual.  EXTREMITIES:  She has limited range of motion turning 45 degrees to the right and 45 degrees to the left. She extends, normally can flex, not touching her chin to her chest by two fingerbreadths. Reflexes are silent in the biceps, 1+ in the triceps, 2+ in the patellae, 1+ in the Achilles. Babinskis are downgoing. Sensation in the upper extremities is intact in all dermatomes. Extremities reveal no cyanosis, clubbing or edema. There are significant scars about both knees.  NECK:  No masses palpated, no bruits heard.  NEUROLOGIC:  Cranial nerve examination reveals her pupils are 4 mm, briskly reactive to light and accommodation.  Extraocular movements are full. The face is symmetric to grimace. Tongue and uvular are in the midline. Sclerae and conjunctivae are clear. Funduscopic exam reveals the disks are flat.  LUNGS:  Clear to auscultation.  HEART:  Regular rate and rhythm.  ABDOMEN:  Soft. Bowel sounds positive. No masses are palpable.  IMPRESSION:  The patient has evidence of spondylitic myelopathy in the cervical spine with cord compression at the C4-5 level. She is now to undergo  diskectomy and arthrodesis using an Allograft technique. Old hardware placed from C5 to C7 will be removed and the patient will have placement of Synthes hardware at C4-5. DD:  09/17/99 TD:  09/17/99 Job: [id number]

## 2010-06-08 NOTE — Procedures (Signed)
[place]. [facility]  Patient:    [name], [name]                        MRN: [id number] Proc. Date: 06/06/99 Adm. Date:  [id number] Disc. Date: [id number] Attending:  Charna Elizabeth CC:         Dr. Mal Misty                           Procedure Report  DATE OF BIRTH:  [birth year]  REFERRING PHYSICIAN:  Dr. Mal Misty.  PROCEDURE PERFORMED:  Esophagogastroduodenoscopy.  ENDOSCOPIST:  Anselmo Rod, M.D.  INSTRUMENT USED:  Olympus video panendoscope.  INDICATIONS FOR PROCEDURE:  Hematemesis in a 50 year old white female, rule out peptic ulcer disease, esophagitis, gastritis, etc.  PREPROCEDURE PREPARATION:  Informed consent was procured from the patient. The patient was fasted for eight hours prior to the procedure.  PREPROCEDURE PHYSICAL:  The patient had stable vital signs.  Neck supple. Chest clear to auscultation.  S1, S2 regular.  Abdomen soft with normal abdominal bowel sounds.  DESCRIPTION OF PROCEDURE:  The patient was placed in left lateral decubitus position and sedated with 80 mg of Demerol and 8 mg of Versed intravenously. Once the patient was adequately sedated and maintained on low-flow oxygen and continuous cardiac monitoring, the Olympus video panendoscope was advanced through the mouthpiece, over the tongue, into the esophagus under direct vision.  The proximal esophagus appeared normal. There was severe distal esophagitis with ulceration from 25 to 35 cm.  A small prepyloric ulcer was seen in the antrum without a visible vessel.  Biopsy was done from the antrum to rule out presence of Helicobacter pylori by CLO test.  The duodenal bulb and small bowel distal to the bulb up to 60 cm appeared normal.  There was no outlet obstruction.  The patient tolerated the procedure well without complications.  IMPRESSION: 1. Severe distal esophagitis. 2. Small hiatal hernia. 3. Small superficial prepyloric ulcer without visible  vessel. 4. CLO test normal. 5. Normal proximal small bowel.  RECOMMENDATION: 1. Patient will proceed with Prevacid and Carafate slurry for now. 2. She will be treated with antibiotics if she is CLO positive. 3. She is to avoid all nonsteroidals for now. 4. Outpatient follow-up in the next two weeks. DD:  06/06/99 TD:  06/11/99 Job: [id number]

## 2010-06-08 NOTE — Op Note (Signed)
[place]. [facility]  Patient:    [name], [name]                        MRN: [id number] Proc. Date: 06/25/99 Adm. Date:  [id number] Attending:  Alinda Deem                           Operative Report  PREOPERATIVE DIAGNOSIS:  Right shoulder impingement syndrome with mild acromioclavicular joint arthritis and partial thickness rotator cuff tear.  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  PROCEDURE:  Right shoulder arthroscopic anterior-inferior acromioplasty, excision of distal clavicular spur, debridement of partial thickness rotator cuff tear, and debridement of degenerative tear of the superior-anterior aspect of the labrum, degenerative.  SURGEON:  Alinda Deem, M.D.  FIRST ASSISTANT:  Dorthula Matas, P.A.-C.  ANESTHESIA:  Interscalene block with general LMA.  ESTIMATED BLOOD LOSS:  Minimal.  FLUID REPLACEMENT:  800 cc of crystalloid.  DRAINS PLACED:  None.  TOURNIQUET TIME:  None.  INDICATIONS FOR PROCEDURE:  A 50 year old woman with ankylosing spondylitis. 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  She has had right shoulder impingement syndrome symptoms with good transient response to subacromial bursal injection and an MRI scan showing some supraspinatus tendonosis and a partial thickness cuff tear, but no full thickness cuff tear.  She has failed conservative treatment with physical therapy, anti-inflammatory medicines, a Cortisone injection, and now desires arthroscopic evaluation of the shoulder to decrease pain increase function.  DESCRIPTION OF PROCEDURE:  The patient was identified by arm band and taken to the operating room at [facility].  Appropriate anesthetic monitors were  attached and interscalene block anesthesia included to the right upper extremity followed by general LMA anesthesia.  The patient was then placed in the beach chair position and the right upper extremity prepped and draped in the usual sterile fashion from the wrist to the hemithorax.  The skin along the anterolateral and posterior aspect of the acromion process was infiltrated with 2-3 cc of 0.5% Marcaine and epinephrine solution to enter the subacromial space and through the portal.  No back flow was noted.  Using a #11 blade, ______ portals were then made 0.5 cm anterior to the [facility] joint, lateral to the junction of the middle and posterior thirds of the acromion, posterior to the posterolateral corner of the acromion process.  The arthroscope was introduced through the lateral portal, the inflow through the anterior portal, and a 4.2 mm barracuda sucker shaver through the posterior portal.  Subacromial bursectomy was accomplished with the resection of part of the CA ligament revealing the subacromial spur which was done with a 4.5 mm hooded vortex spur, as was a distal clavicle inferior spur at the [facility] joint which was actually in pretty good shape.  The rotator cuff tendons of the supraspinatus and infraspinatus were noted to track down to their insertions n the greater tuberosity and were noted to be intact with some superficial fraying that was lightly debrided with the barracuda sucker shaver.  The shoulder was taken through a range of motion confirming no full thickness rotator cuff tears externally.  The arthroscope was then redirected into the glenohumeral joint using the posterior portal where  we identified a degenerative tear of the superior-anterior labrum which was debrided back towards the biceps anchor which was intact.  The anterior-inferior labrum was intact and the articular cartilage of the glenohumeral joint was actually in good condition.  The subscapularis was intact  as well.  There was some inflammation noted to the rotator cuff with hyperemia of the musculotendinous junction, but this was underneath the spur region and should improve with the removal of the spur.  At this point, the shoulder was washed out with normal saline solution.  The arthroscopic instruments were removed.  A dressing of Xeroform followed by a dressing, sponges, and paper tape applied.  The patient was then awakened and taken to the recovery room without difficulty. DD:  06/25/99 TD:  06/27/99 Job: [id number]

## 2010-06-08 NOTE — Procedures (Signed)
[place]. [facility]  Patient:    [name], [name]                        MRN: [id number] Proc. Date: 08/28/99 Adm. Date:  [id number] Disc. Date: [id number] Attending:  Osvaldo Human CC:         Dr. Clement Husbands   Procedure Report  DATE OF BIRTH:  1960/07/24  REFERRING PHYSICIAN:  Dr. Clement Husbands.  PROCEDURE PERFORMED:  Colonoscopy.  ENDOSCOPIST:  Anselmo Rod, M.D.  INSTRUMENT USED:  Olympus video colonoscope.  INDICATIONS FOR PROCEDURE:  Rectal bleeding in a 50 year old white female, rule out colonic polyps, masses, hemorrhoids, etc.  PREPROCEDURE PREPARATION:  Informed consent was procured from the patient. The patient was fasted for eight hours prior to the procedure, and prepped with a bottle of magnesium citrate, and a gallon of NuLytely the night prior to the procedure.  PREPROCEDURE PHYSICAL:  VITAL SIGNS:  Stable.  NECK:  Supple.  CHEST:  Clear to auscultation, S1 and S2 regular.  ABDOMEN:  Soft ________ present in the right lower quadrant.  DESCRIPTION OF PROCEDURE:  The patient was placed in the left lateral decubitus position, and sedated with 50 mg of Demerol and 7.5 mg of Versed intravenously.  Once the patient was adequately sedated, maintained on low flow oxygen and continuous cardiac monitoring, the Olympus video colonoscope was advanced from the rectum to the cecum with extreme difficulty because of large amount of residual stool in the colon.  Several flushes were given and the colon was cleaned ________ visualization of the colonic mucosa.  No masses, polyps, erosions, or ulcerations were seen.  A very small lesion may have been missed due to a large amount of residual stool in the colon.  The had small nonbleeding internal and external hemorrhoids, and tolerated the procedure well without complications.  IMPRESSION: 1. Small nonbleeding internal and external hemorrhoids, otherwise unrevealing  colon. 2. Large massive polyp seen.  Very small lesion may have been missed secondary    to inadequate preparation.  RECOMMENDATIONS: 1. The patient is advised to increase the fluid and fiber in her diet, and    follow up in the office in the next two weeks. 2. She may use a stool softener like Colace over-the-counter on a p.r.n. basis    as needed. DD:  08/28/99 TD:  08/28/99 Job: [id number]

## 2010-09-04 ENCOUNTER — Other Ambulatory Visit: Payer: Self-pay | Admitting: Family Medicine

## 2010-09-04 NOTE — Telephone Encounter (Signed)
Refill request

## 2010-09-17 ENCOUNTER — Ambulatory Visit (HOSPITAL_COMMUNITY)
Admission: RE | Admit: 2010-09-17 | Discharge: 2010-09-17 | Disposition: A | Discharge status: Home / Self Care | Payer: BC Managed Care – PPO | Source: Ambulatory Visit | Attending: Family Medicine | Admitting: Family Medicine

## 2010-09-17 ENCOUNTER — Encounter: Payer: Self-pay | Admitting: Family Medicine

## 2010-09-17 ENCOUNTER — Ambulatory Visit (INDEPENDENT_AMBULATORY_CARE_PROVIDER_SITE_OTHER): Payer: BC Managed Care – PPO | Admitting: Family Medicine

## 2010-09-17 VITALS — BP 94/62 | HR 128 | Temp 97.9°F | Wt 204.0 lb

## 2010-09-17 DIAGNOSIS — R9431 Abnormal electrocardiogram [ECG] [EKG]: Secondary | ICD-10-CM

## 2010-09-17 DIAGNOSIS — I10 Essential (primary) hypertension: Secondary | ICD-10-CM

## 2010-09-17 DIAGNOSIS — Z01818 Encounter for other preprocedural examination: Secondary | ICD-10-CM

## 2010-09-17 DIAGNOSIS — E039 Hypothyroidism, unspecified: Secondary | ICD-10-CM

## 2010-09-17 DIAGNOSIS — E7800 Pure hypercholesterolemia, unspecified: Secondary | ICD-10-CM

## 2010-09-17 DIAGNOSIS — Z01810 Encounter for preprocedural cardiovascular examination: Secondary | ICD-10-CM | POA: Insufficient documentation

## 2010-09-17 LAB — TSH: TSH: 0.113 u[IU]/mL — ABNORMAL LOW (ref 0.350–4.500)

## 2010-09-17 MED ORDER — PRAVASTATIN SODIUM 20 MG PO TABS: 20.0000 mg | ORAL_TABLET | Freq: Every day | ORAL | Status: DC

## 2010-09-17 MED ORDER — TRIAMTERENE-HCTZ 37.5-25 MG PO TABS: 0.5000 | ORAL_TABLET | Freq: Every day | ORAL | Status: DC

## 2010-09-17 NOTE — Assessment & Plan Note (Signed)
Low and symptomatic. Will 1/2 the dose at this time.

## 2010-09-17 NOTE — Assessment & Plan Note (Signed)
Recheck TSH and adjust accordingly.

## 2010-09-17 NOTE — Assessment & Plan Note (Signed)
Q waves in anterior leads, has been there for years with no change, pt though needs surgery for her knee. Will send to cardiology for evaluation, pt though is medically stable for surgery.

## 2010-09-17 NOTE — Progress Notes (Signed)
  Subjective:    Patient ID: [name], female    DOB: 11-Sep-1960, 50 y.o.   MRN: [id number]  HPI Pt was called and told f lab results long ago but never cam back for a visit.  1.  Hypothyroid-  Pt on synthroid was a little elevated last time denies any fatigue, would like more energy, no hair loss no swelling.    2. HLD- Direct LDL was elevated pt though had not had her lipitor filled for over a year.  Pt is not against taking cholesterol medicine and is ready to try again if need be.    3.  Knee pain-  Pt is going to have her knee replaced.  Pt needs clearance.  Denies chest pain had an abnormal ECG previously but never followed up on it before.  Pt unable to do much exercise due to the knee.  Pt haas never was seen by a cardiologist.  Denies shortness of breath  Or dyspnea on exertion with activity.    4.   Hypertension Blood pressure at home:does not check Blood pressure today: 94/62 Taking Meds:yes Side effects:feeling very dizzy a lot of the time.  ROS: Denies headache visual changes nausea, vomiting, chest pain or abdominal pain or shortness of breath.    Review of Systems As above.     Objective:   Physical Exam General Appearance:    Alert, cooperative, no distress, appears stated age obese  Head:    Normocephalic, without obvious abnormality, atraumatic  Eyes:    PERRL, conjunctiva/corneas clear, EOM's intact  Throat:   Lips, mucosa, and tongue normal; teeth and gums normal  Neck:   Supple, symmetrical, trachea midline, no adenopathy;    thyroid:  no enlargement/tenderness/nodules;   Back:     Symmetric, no curvature, ROM normal, multiple tender points  Lungs:     Clear to auscultation bilaterally, respirations unlabored, coarse.    Heart:    Regular rate and rhythm, S1 and S2 normal, no murmur, rub   or gallop  Abdomen:     Soft, non-tender, bowel sounds active all four quadrants,    no masses, no organomegaly obese  Extremities:   Extremities normal, atraumatic,  no cyanosis or edema  Pulses:   2+ and symmetric all extremities          Assessment & Plan:

## 2010-09-17 NOTE — Assessment & Plan Note (Signed)
Start pravastatin at this time. Recheck in 3 months

## 2010-09-17 NOTE — Patient Instructions (Signed)
We started a medicine called pravastatin for your cholesterol I want you to decrease your blood pressure medicine at 1/2 tab daily I will check your your thyroid again today I am getting an G today for your surgery, you are cleared but you should stop smoking! I want to see you again in 3 months.

## 2010-09-26 HISTORY — PX: DOPPLER ECHOCARDIOGRAPHY: SHX263

## 2010-10-15 LAB — COMPREHENSIVE METABOLIC PANEL
ALT: 15
AST: 23
Albumin: 3.5
Alkaline Phosphatase: 100
CO2: 34 — ABNORMAL HIGH
Chloride: 94 — ABNORMAL LOW
Creatinine, Ser: 1.38 — ABNORMAL HIGH
GFR calc Af Amer: 50 — ABNORMAL LOW
GFR calc non Af Amer: 41 — ABNORMAL LOW
Potassium: 3.5
Total Bilirubin: 0.4

## 2010-10-15 LAB — TYPE AND SCREEN: ABO/RH(D): O POS

## 2010-10-15 LAB — CBC
MCV: 91.4
RBC: 4.83
WBC: 10.3

## 2010-11-24 ENCOUNTER — Other Ambulatory Visit: Payer: Self-pay | Admitting: Family Medicine

## 2010-11-25 NOTE — Telephone Encounter (Signed)
Refill request

## 2010-11-29 ENCOUNTER — Encounter: Payer: Self-pay | Admitting: Family Medicine

## 2010-11-29 ENCOUNTER — Ambulatory Visit (INDEPENDENT_AMBULATORY_CARE_PROVIDER_SITE_OTHER): Payer: BC Managed Care – PPO | Admitting: Family Medicine

## 2010-11-29 VITALS — BP 115/73 | HR 100 | Temp 97.6°F | Ht 63.0 in | Wt 202.0 lb

## 2010-11-29 DIAGNOSIS — H60399 Other infective otitis externa, unspecified ear: Secondary | ICD-10-CM

## 2010-11-29 DIAGNOSIS — H6090 Unspecified otitis externa, unspecified ear: Secondary | ICD-10-CM

## 2010-11-29 NOTE — Progress Notes (Signed)
  Subjective:     [name] is a 50 y.o. female who presents for evaluation of right ear pain and bleeding. Symptoms have been present for 1 day. She also notes no hearing loss, drainage in both ears and it is clear in nature.. She does not have a history of ear infections. She does not have a history of recent swimming. She does use q-tips in both ears fairly aggressively. She has never had bleeding before. No fevers/chills/weight loss.  Review of Systems Pertinent items are noted in HPI.   Objective:    BP 115/73  Pulse 100  Temp(Src) 97.6 F (36.4 C) (Oral)  Ht 5\' 3"  (1.6 m)  Wt 202 lb (91.627 kg)  BMI 35.78 kg/m2  LMP 05/17/2009 General:  alert and cooperative  Right Ear: right TM normal landmarks and mobility and right canal red, inflamed and irritated  Left Ear: normal appearance  Mouth:  lips, mucosa, and tongue normal; teeth and gums normal  Neck: no adenopathy and supple, symmetrical, trachea midline       Assessment:    Right otitis externa    Plan:    Treatment: Cortisporin. OTC analgesia as needed. Water exclusion from affected ear until symptoms resolve. Follow up in prn  if symptoms not improving.

## 2010-11-29 NOTE — Assessment & Plan Note (Signed)
Treatment: Cortisporin. OTC analgesia as needed. Water exclusion from affected ear until symptoms resolve. Follow up in prn  if symptoms not improving.

## 2010-11-29 NOTE — Patient Instructions (Signed)
Treatment: Cortisporin. OTC analgesia as needed. Water exclusion from affected ear until symptoms resolve. Follow up in prn  if symptoms not improving.  

## 2010-12-07 ENCOUNTER — Ambulatory Visit (INDEPENDENT_AMBULATORY_CARE_PROVIDER_SITE_OTHER): Payer: BC Managed Care – PPO | Admitting: Family Medicine

## 2010-12-07 ENCOUNTER — Encounter: Payer: Self-pay | Admitting: Family Medicine

## 2010-12-07 VITALS — BP 102/61 | HR 120 | Temp 98.0°F | Ht 63.0 in | Wt 221.0 lb

## 2010-12-07 DIAGNOSIS — E039 Hypothyroidism, unspecified: Secondary | ICD-10-CM

## 2010-12-07 DIAGNOSIS — E7800 Pure hypercholesterolemia, unspecified: Secondary | ICD-10-CM

## 2010-12-07 DIAGNOSIS — F339 Major depressive disorder, recurrent, unspecified: Secondary | ICD-10-CM

## 2010-12-07 DIAGNOSIS — I10 Essential (primary) hypertension: Secondary | ICD-10-CM

## 2010-12-07 MED ORDER — CITALOPRAM HYDROBROMIDE 20 MG PO TABS: 40.0000 mg | ORAL_TABLET | ORAL | Status: DC

## 2010-12-07 NOTE — Patient Instructions (Addendum)
I am sorry to hear about all that is going on.  This is hard and I am here if I can help We will increase your Celexa to 2 pills daily I am giving you a number for Dr. Pascal Lux if you would like to call her and see if she can help We will check labs and I will call you with your results Stop your blood pressure medicine I will see you again in 3 weeks and we can check you weight Next time I see you I would like to see you down to 1/4 ppd.  I will get a renal ultrasound as well and send you to a nephrologist.

## 2010-12-07 NOTE — Assessment & Plan Note (Signed)
Seems to be doing well but we will get a repeat TSH at this time. Patient though has had a recent weight change and recent swelling of her ankles as well.

## 2010-12-07 NOTE — Assessment & Plan Note (Signed)
Patient has a long history and has a potential for only one functioning kidney. We will get a repeat of the renal ultrasound and send patient to nephrology for evaluation and possible management. In addition to that we have discontinued her blood pressure medications at this time. One other symptom that might be alarming his patient is having some swelling of the ankles bilaterally and when asked patient does state she has some decreased urine output. We will get a stat be met and see where patient's creatinine is at this time. Looking over the rest of patient's medication list that many other changes can be made did tell her to avoid any ibuprofen or any over-the-counter pain medications other than Tylenol. Patient does get her pain medications from the pain Center.
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
## 2010-12-07 NOTE — Assessment & Plan Note (Signed)
Patient is actually hypotensive today and having side effects to 2 her blood pressure medications we will discontinue the medication at this time and follow up in 3 weeks. Also due to patient's potential worsening of her renal function with the clinical exam today we will have to try another medication such as a beta blocker if necessary.

## 2010-12-07 NOTE — Assessment & Plan Note (Signed)
Discussed with patient at this time patient does seem to be a little mild depressed and has some signs of increased weight gain insomnia lack of concentration as well. We'll increase her Celexa to 40 mg daily warned her of potential side effects and to call me if these do occur patient also given Dr. Carola Rhine card for possible visit patient has had many things happen to her including her son who is 50 years old been diagnosed with stage IV lymphoma. Will followup again in 3 weeks to make sure patient is doing well

## 2010-12-07 NOTE — Assessment & Plan Note (Signed)
Recheck LDL and adjust medication accordingly patient has been on Lipitor for 3 months to

## 2010-12-08 LAB — TSH: TSH: 0.283 u[IU]/mL — ABNORMAL LOW (ref 0.350–4.500)

## 2010-12-08 LAB — BASIC METABOLIC PANEL
BUN: 11 mg/dL (ref 6–23)
CO2: 32 mEq/L (ref 19–32)
Calcium: 8.4 mg/dL (ref 8.4–10.5)
Chloride: 102 mEq/L (ref 96–112)
Creat: 1.2 mg/dL — ABNORMAL HIGH (ref 0.50–1.10)
Glucose, Bld: 104 mg/dL — ABNORMAL HIGH (ref 70–99)

## 2010-12-10 ENCOUNTER — Other Ambulatory Visit (HOSPITAL_COMMUNITY): Payer: BC Managed Care – PPO

## 2010-12-11 ENCOUNTER — Telehealth: Payer: Self-pay | Admitting: *Deleted

## 2010-12-11 NOTE — Telephone Encounter (Signed)
LMOVM asking pt to give [place] a callback for her U/S appt.  The appt is scheduled for 11.27.12 @ 9:15am.  Please inform her when she calls back.  The phone number to change the appt is [phone]. Colen Eltzroth, Maryjo Rochester

## 2010-12-13 ENCOUNTER — Emergency Department (HOSPITAL_COMMUNITY): Payer: BC Managed Care – PPO

## 2010-12-13 ENCOUNTER — Other Ambulatory Visit: Payer: Self-pay

## 2010-12-13 ENCOUNTER — Inpatient Hospital Stay (HOSPITAL_COMMUNITY)
Admission: EM | Admit: 2010-12-13 | Discharge: 2010-12-24 | DRG: 533 | Disposition: A | Discharge status: Home Health | Payer: BC Managed Care – PPO | Attending: Internal Medicine | Admitting: Internal Medicine

## 2010-12-13 ENCOUNTER — Encounter (HOSPITAL_COMMUNITY): Payer: Self-pay | Admitting: *Deleted

## 2010-12-13 DIAGNOSIS — N200 Calculus of kidney: Secondary | ICD-10-CM

## 2010-12-13 DIAGNOSIS — G9340 Encephalopathy, unspecified: Secondary | ICD-10-CM

## 2010-12-13 DIAGNOSIS — F17200 Nicotine dependence, unspecified, uncomplicated: Secondary | ICD-10-CM

## 2010-12-13 DIAGNOSIS — R739 Hyperglycemia, unspecified: Secondary | ICD-10-CM | POA: Diagnosis not present

## 2010-12-13 DIAGNOSIS — D7589 Other specified diseases of blood and blood-forming organs: Secondary | ICD-10-CM

## 2010-12-13 DIAGNOSIS — F1320 Sedative, hypnotic or anxiolytic dependence, uncomplicated: Secondary | ICD-10-CM | POA: Diagnosis present

## 2010-12-13 DIAGNOSIS — R609 Edema, unspecified: Secondary | ICD-10-CM

## 2010-12-13 DIAGNOSIS — E785 Hyperlipidemia, unspecified: Secondary | ICD-10-CM | POA: Diagnosis present

## 2010-12-13 DIAGNOSIS — B179 Acute viral hepatitis, unspecified: Secondary | ICD-10-CM

## 2010-12-13 DIAGNOSIS — E876 Hypokalemia: Secondary | ICD-10-CM

## 2010-12-13 DIAGNOSIS — G929 Unspecified toxic encephalopathy: Principal | ICD-10-CM | POA: Diagnosis present

## 2010-12-13 DIAGNOSIS — K7682 Hepatic encephalopathy: Secondary | ICD-10-CM

## 2010-12-13 DIAGNOSIS — R51 Headache: Secondary | ICD-10-CM

## 2010-12-13 DIAGNOSIS — K7290 Hepatic failure, unspecified without coma: Secondary | ICD-10-CM

## 2010-12-13 DIAGNOSIS — R4182 Altered mental status, unspecified: Secondary | ICD-10-CM

## 2010-12-13 DIAGNOSIS — R791 Abnormal coagulation profile: Secondary | ICD-10-CM | POA: Diagnosis present

## 2010-12-13 DIAGNOSIS — J9602 Acute respiratory failure with hypercapnia: Secondary | ICD-10-CM

## 2010-12-13 DIAGNOSIS — R87610 Atypical squamous cells of undetermined significance on cytologic smear of cervix (ASC-US): Secondary | ICD-10-CM

## 2010-12-13 DIAGNOSIS — E039 Hypothyroidism, unspecified: Secondary | ICD-10-CM

## 2010-12-13 DIAGNOSIS — A498 Other bacterial infections of unspecified site: Secondary | ICD-10-CM | POA: Diagnosis present

## 2010-12-13 DIAGNOSIS — E7220 Disorder of urea cycle metabolism, unspecified: Secondary | ICD-10-CM | POA: Diagnosis present

## 2010-12-13 DIAGNOSIS — M4150 Other secondary scoliosis, site unspecified: Secondary | ICD-10-CM

## 2010-12-13 DIAGNOSIS — N390 Urinary tract infection, site not specified: Secondary | ICD-10-CM

## 2010-12-13 DIAGNOSIS — R451 Restlessness and agitation: Secondary | ICD-10-CM | POA: Diagnosis present

## 2010-12-13 DIAGNOSIS — N179 Acute kidney failure, unspecified: Secondary | ICD-10-CM

## 2010-12-13 DIAGNOSIS — Z7189 Other specified counseling: Secondary | ICD-10-CM | POA: Diagnosis present

## 2010-12-13 DIAGNOSIS — H6090 Unspecified otitis externa, unspecified ear: Secondary | ICD-10-CM

## 2010-12-13 DIAGNOSIS — J189 Pneumonia, unspecified organism: Secondary | ICD-10-CM | POA: Diagnosis present

## 2010-12-13 DIAGNOSIS — N183 Chronic kidney disease, stage 3 unspecified: Secondary | ICD-10-CM

## 2010-12-13 DIAGNOSIS — M999 Biomechanical lesion, unspecified: Secondary | ICD-10-CM

## 2010-12-13 DIAGNOSIS — G4700 Insomnia, unspecified: Secondary | ICD-10-CM

## 2010-12-13 DIAGNOSIS — F19939 Other psychoactive substance use, unspecified with withdrawal, unspecified: Secondary | ICD-10-CM | POA: Diagnosis present

## 2010-12-13 DIAGNOSIS — I10 Essential (primary) hypertension: Secondary | ICD-10-CM | POA: Diagnosis present

## 2010-12-13 DIAGNOSIS — I129 Hypertensive chronic kidney disease with stage 1 through stage 4 chronic kidney disease, or unspecified chronic kidney disease: Secondary | ICD-10-CM | POA: Diagnosis present

## 2010-12-13 DIAGNOSIS — F339 Major depressive disorder, recurrent, unspecified: Secondary | ICD-10-CM

## 2010-12-13 DIAGNOSIS — M810 Age-related osteoporosis without current pathological fracture: Secondary | ICD-10-CM

## 2010-12-13 DIAGNOSIS — N393 Stress incontinence (female) (male): Secondary | ICD-10-CM

## 2010-12-13 DIAGNOSIS — F411 Generalized anxiety disorder: Secondary | ICD-10-CM

## 2010-12-13 DIAGNOSIS — E7800 Pure hypercholesterolemia, unspecified: Secondary | ICD-10-CM

## 2010-12-13 DIAGNOSIS — M069 Rheumatoid arthritis, unspecified: Secondary | ICD-10-CM

## 2010-12-13 DIAGNOSIS — D689 Coagulation defect, unspecified: Secondary | ICD-10-CM | POA: Diagnosis present

## 2010-12-13 DIAGNOSIS — R0902 Hypoxemia: Secondary | ICD-10-CM | POA: Diagnosis present

## 2010-12-13 DIAGNOSIS — Z781 Physical restraint status: Secondary | ICD-10-CM | POA: Diagnosis not present

## 2010-12-13 DIAGNOSIS — G4733 Obstructive sleep apnea (adult) (pediatric): Secondary | ICD-10-CM | POA: Diagnosis present

## 2010-12-13 DIAGNOSIS — K7200 Acute and subacute hepatic failure without coma: Secondary | ICD-10-CM | POA: Diagnosis present

## 2010-12-13 DIAGNOSIS — T50905A Adverse effect of unspecified drugs, medicaments and biological substances, initial encounter: Secondary | ICD-10-CM

## 2010-12-13 DIAGNOSIS — K279 Peptic ulcer, site unspecified, unspecified as acute or chronic, without hemorrhage or perforation: Secondary | ICD-10-CM

## 2010-12-13 DIAGNOSIS — M459 Ankylosing spondylitis of unspecified sites in spine: Secondary | ICD-10-CM

## 2010-12-13 DIAGNOSIS — J9600 Acute respiratory failure, unspecified whether with hypoxia or hypercapnia: Secondary | ICD-10-CM | POA: Diagnosis present

## 2010-12-13 DIAGNOSIS — R9431 Abnormal electrocardiogram [ECG] [EKG]: Secondary | ICD-10-CM

## 2010-12-13 DIAGNOSIS — E669 Obesity, unspecified: Secondary | ICD-10-CM

## 2010-12-13 DIAGNOSIS — E870 Hyperosmolality and hypernatremia: Secondary | ICD-10-CM

## 2010-12-13 DIAGNOSIS — G92 Toxic encephalopathy: Principal | ICD-10-CM | POA: Diagnosis present

## 2010-12-13 DIAGNOSIS — R630 Anorexia: Secondary | ICD-10-CM

## 2010-12-13 HISTORY — DX: Calculus of kidney: N20.0

## 2010-12-13 HISTORY — DX: Hypothyroidism, unspecified: E03.9

## 2010-12-13 HISTORY — DX: Tobacco use: Z72.0

## 2010-12-13 HISTORY — DX: Ankylosing spondylitis of unspecified sites in spine: M45.9

## 2010-12-13 HISTORY — DX: Chronic pain syndrome: G89.4

## 2010-12-13 HISTORY — DX: Anxiety disorder, unspecified: F41.9

## 2010-12-13 HISTORY — DX: Chronic kidney disease, stage 3 (moderate): N18.3

## 2010-12-13 HISTORY — DX: Gastroparesis: K31.84

## 2010-12-13 HISTORY — DX: Chronic kidney disease, stage 3 unspecified: N18.30

## 2010-12-13 HISTORY — DX: Obesity, unspecified: E66.9

## 2010-12-13 LAB — ACETAMINOPHEN LEVEL: Acetaminophen (Tylenol), Serum: <15 ug/mL (ref 10–30)

## 2010-12-13 LAB — COMPREHENSIVE METABOLIC PANEL
ALT: 3299 U/L — ABNORMAL HIGH (ref 0–35)
AST: 8818 U/L — ABNORMAL HIGH (ref 0–37)
Albumin: 2.9 g/dL — ABNORMAL LOW (ref 3.5–5.2)
Alkaline Phosphatase: 206 U/L — ABNORMAL HIGH (ref 39–117)
BUN: 20 mg/dL (ref 6–23)
CO2: 26 mEq/L (ref 19–32)
Calcium: 8.9 mg/dL (ref 8.4–10.5)
Chloride: 100 mEq/L (ref 96–112)
Creatinine, Ser: 1.62 mg/dL — ABNORMAL HIGH (ref 0.50–1.10)
GFR calc Af Amer: 42 mL/min — ABNORMAL LOW (ref >90)
GFR calc non Af Amer: 36 mL/min — ABNORMAL LOW (ref >90)
Glucose, Bld: 121 mg/dL — ABNORMAL HIGH (ref 70–99)
Potassium: 4.6 mEq/L (ref 3.5–5.1)
Sodium: 142 mEq/L (ref 135–145)
Total Bilirubin: 1.2 mg/dL (ref 0.3–1.2)
Total Protein: 6 g/dL (ref 6.0–8.3)

## 2010-12-13 LAB — DIFFERENTIAL
Basophils Relative: 0 % (ref 0–1)
Eosinophils Absolute: 0 10*3/uL (ref 0.0–0.7)
Eosinophils Relative: 0 % (ref 0–5)
Lymphs Abs: 1.7 10*3/uL (ref 0.7–4.0)
Neutrophils Relative %: 80 % — ABNORMAL HIGH (ref 43–77)

## 2010-12-13 LAB — CBC
MCH: 32.5 pg (ref 26.0–34.0)
MCHC: 30.8 g/dL (ref 30.0–36.0)
MCV: 105.8 fL — ABNORMAL HIGH (ref 78.0–100.0)
Platelets: 218 10*3/uL (ref 150–400)
RDW: 17.5 % — ABNORMAL HIGH (ref 11.5–15.5)

## 2010-12-13 LAB — BLOOD GAS, ARTERIAL
Acid-Base Excess: 4.3 mmol/L — ABNORMAL HIGH (ref 0.0–2.0)
Acid-Base Excess: 3.7 mmol/L — ABNORMAL HIGH (ref 0.0–2.0)
Bicarbonate: 30.2 mEq/L — ABNORMAL HIGH (ref 20.0–24.0)
Inspiratory PAP: 12
Mode: POSITIVE
O2 Saturation: 94.3 %
O2 Saturation: 82.9 %
Patient temperature: 37
TCO2: 27.9 mmol/L (ref 0–100)
pCO2 arterial: 54.7 mmHg — ABNORMAL HIGH (ref 35.0–45.0)
pH, Arterial: 7.36 (ref 7.350–7.400)
pO2, Arterial: 83.3 mmHg (ref 80.0–100.0)
pO2, Arterial: 55.5 mmHg — ABNORMAL LOW (ref 80.0–100.0)

## 2010-12-13 LAB — CARDIAC PANEL(CRET KIN+CKTOT+MB+TROPI)
CK, MB: 4.4 ng/mL — ABNORMAL HIGH (ref 0.3–4.0)
Relative Index: 0.8 (ref 0.0–2.5)
Troponin I: <0.3 ng/mL (ref <0.30)

## 2010-12-13 LAB — URINE MICROSCOPIC-ADD ON

## 2010-12-13 LAB — RAPID URINE DRUG SCREEN, HOSP PERFORMED
Amphetamines: NOT DETECTED
Benzodiazepines: POSITIVE — AB
Cocaine: NOT DETECTED

## 2010-12-13 LAB — URINALYSIS, ROUTINE W REFLEX MICROSCOPIC
Bilirubin Urine: NEGATIVE
Glucose, UA: NEGATIVE mg/dL
Leukocytes, UA: NEGATIVE
Nitrite: POSITIVE — AB
Protein, ur: 100 mg/dL — AB
Specific Gravity, Urine: >1.03 — ABNORMAL HIGH (ref 1.005–1.030)
Urobilinogen, UA: 4 mg/dL — ABNORMAL HIGH (ref 0.0–1.0)
pH: 6 (ref 5.0–8.0)

## 2010-12-13 LAB — HEPATITIS C ANTIBODY: HCV Ab: NEGATIVE

## 2010-12-13 LAB — SALICYLATE LEVEL: Salicylate Lvl: 2 mg/dL — ABNORMAL LOW (ref 2.8–20.0)

## 2010-12-13 LAB — ETHANOL: Alcohol, Ethyl (B): <11 mg/dL (ref 0–11)

## 2010-12-13 LAB — T4, FREE: Free T4: 0.88 ng/dL (ref 0.80–1.80)

## 2010-12-13 LAB — AMMONIA: Ammonia: 76 umol/L — ABNORMAL HIGH (ref 11–60)

## 2010-12-13 LAB — TSH: TSH: 0.151 u[IU]/mL — ABNORMAL LOW (ref 0.350–4.500)

## 2010-12-13 LAB — HEPATITIS B SURFACE ANTIGEN: Hepatitis B Surface Ag: NEGATIVE

## 2010-12-13 LAB — PROTIME-INR: INR: 1.79 — ABNORMAL HIGH (ref 0.00–1.49)

## 2010-12-13 MED ORDER — SODIUM CHLORIDE 0.9 % IV SOLN
Freq: Once | INTRAVENOUS | Status: AC
  Administered 2010-12-13: 07:00:00 via INTRAVENOUS

## 2010-12-13 MED ORDER — LACTULOSE 10 GM/15ML PO SOLN
ORAL | Status: AC
  Filled 2010-12-13: qty 180

## 2010-12-13 MED ORDER — LORAZEPAM 2 MG/ML IJ SOLN: 1.0000 mg | INTRAMUSCULAR | Status: DC | PRN

## 2010-12-13 MED ORDER — ALBUTEROL SULFATE (5 MG/ML) 0.5% IN NEBU
2.5000 mg | INHALATION_SOLUTION | RESPIRATORY_TRACT | Status: DC | PRN
  Administered 2010-12-18: 2.5 mg via RESPIRATORY_TRACT
  Filled 2010-12-13: qty 0.5

## 2010-12-13 MED ORDER — NICOTINE 21 MG/24HR TD PT24
21.0000 mg | MEDICATED_PATCH | Freq: Every day | TRANSDERMAL | Status: DC
  Administered 2010-12-13 – 2010-12-24 (×10): 21 mg via TRANSDERMAL
  Filled 2010-12-13 (×15): qty 1

## 2010-12-13 MED ORDER — ACETAMINOPHEN 325 MG PO TABS: 650.0000 mg | ORAL_TABLET | Freq: Four times a day (QID) | ORAL | Status: DC | PRN

## 2010-12-13 MED ORDER — SODIUM CHLORIDE 0.9 % IJ SOLN
INTRAMUSCULAR | Status: AC
  Administered 2010-12-13: 20:00:00
  Filled 2010-12-13: qty 3

## 2010-12-13 MED ORDER — PANTOPRAZOLE SODIUM 40 MG IV SOLR
40.0000 mg | INTRAVENOUS | Status: DC
  Administered 2010-12-13 – 2010-12-24 (×12): 40 mg via INTRAVENOUS
  Filled 2010-12-13 (×12): qty 40

## 2010-12-13 MED ORDER — LACTULOSE ENEMA
300.0000 mL | Freq: Four times a day (QID) | ORAL | Status: DC
  Administered 2010-12-13 – 2010-12-15 (×8): 300 mL via RECTAL
  Filled 2010-12-13 (×17): qty 300

## 2010-12-13 MED ORDER — SODIUM CHLORIDE 0.9 % IV SOLN
INTRAVENOUS | Status: DC
  Administered 2010-12-13 – 2010-12-15 (×3): via INTRAVENOUS

## 2010-12-13 MED ORDER — ZIPRASIDONE MESYLATE 20 MG IM SOLR
10.0000 mg | Freq: Once | INTRAMUSCULAR | Status: AC
  Administered 2010-12-13: 10 mg via INTRAMUSCULAR

## 2010-12-13 MED ORDER — ONDANSETRON HCL 4 MG/2ML IJ SOLN: 4.0000 mg | Freq: Four times a day (QID) | INTRAMUSCULAR | Status: DC | PRN

## 2010-12-13 MED ORDER — VITAMIN K1 10 MG/ML IJ SOLN
INTRAMUSCULAR | Status: AC
  Filled 2010-12-13: qty 1

## 2010-12-13 MED ORDER — HALOPERIDOL LACTATE 5 MG/ML IJ SOLN
2.0000 mg | Freq: Once | INTRAMUSCULAR | Status: AC
  Administered 2010-12-13: 2 mg via INTRAVENOUS

## 2010-12-13 MED ORDER — VANCOMYCIN HCL 1000 MG IV SOLR
1250.0000 mg | INTRAVENOUS | Status: DC
  Administered 2010-12-13: 1250 mg via INTRAVENOUS
  Filled 2010-12-13 (×2): qty 1250

## 2010-12-13 MED ORDER — LEVOTHYROXINE SODIUM 100 MCG IV SOLR
50.0000 ug | Freq: Every day | INTRAVENOUS | Status: DC
  Administered 2010-12-13: 50 ug via INTRAVENOUS
  Administered 2010-12-14: 2.5 ug via INTRAVENOUS
  Administered 2010-12-15 – 2010-12-19 (×5): 50 ug via INTRAVENOUS
  Filled 2010-12-13 (×7): qty 2.5

## 2010-12-13 MED ORDER — ONDANSETRON HCL 4 MG PO TABS: 4.0000 mg | ORAL_TABLET | Freq: Four times a day (QID) | ORAL | Status: DC | PRN

## 2010-12-13 MED ORDER — HALOPERIDOL LACTATE 5 MG/ML IJ SOLN
2.0000 mg | INTRAMUSCULAR | Status: DC | PRN
  Administered 2010-12-13: 2 mg via INTRAVENOUS
  Filled 2010-12-13 (×3): qty 1

## 2010-12-13 MED ORDER — HALOPERIDOL LACTATE 5 MG/ML IJ SOLN
2.0000 mg | Freq: Four times a day (QID) | INTRAMUSCULAR | Status: DC | PRN
  Administered 2010-12-13 (×2): 2 mg via INTRAVENOUS
  Filled 2010-12-13 (×2): qty 1

## 2010-12-13 MED ORDER — ZIPRASIDONE MESYLATE 20 MG IM SOLR
INTRAMUSCULAR | Status: AC
  Filled 2010-12-13: qty 20

## 2010-12-13 MED ORDER — SODIUM CHLORIDE 0.9 % IJ SOLN
INTRAMUSCULAR | Status: AC
  Administered 2010-12-13: 21:00:00
  Filled 2010-12-13: qty 3

## 2010-12-13 MED ORDER — HYDROMORPHONE HCL PF 1 MG/ML IJ SOLN
0.5000 mg | INTRAMUSCULAR | Status: DC | PRN
  Administered 2010-12-13: 14:00:00 via INTRAVENOUS
  Administered 2010-12-13 – 2010-12-14 (×4): 0.5 mg via INTRAVENOUS
  Filled 2010-12-13 (×5): qty 1

## 2010-12-13 MED ORDER — VITAMIN K1 10 MG/ML IJ SOLN
10.0000 mg | Freq: Once | INTRAVENOUS | Status: AC
  Administered 2010-12-13: 10 mg via INTRAVENOUS
  Filled 2010-12-13: qty 1

## 2010-12-13 MED ORDER — ACETAMINOPHEN 650 MG RE SUPP: 650.0000 mg | Freq: Four times a day (QID) | RECTAL | Status: DC | PRN

## 2010-12-13 MED ORDER — DEXTROSE 5 % IV SOLN
1.0000 g | Freq: Once | INTRAVENOUS | Status: AC
  Administered 2010-12-13: 1 g via INTRAVENOUS
  Filled 2010-12-13: qty 10

## 2010-12-13 MED ORDER — LORAZEPAM 2 MG/ML IJ SOLN
2.0000 mg | INTRAMUSCULAR | Status: DC | PRN
  Administered 2010-12-13 (×3): 2 mg via INTRAVENOUS
  Filled 2010-12-13 (×4): qty 1

## 2010-12-13 MED ORDER — DEXTROSE 5 % IV SOLN
1.0000 g | INTRAVENOUS | Status: DC
  Administered 2010-12-13 – 2010-12-15 (×3): 1 g via INTRAVENOUS
  Filled 2010-12-13 (×4): qty 10

## 2010-12-13 MED ORDER — LORAZEPAM 2 MG/ML IJ SOLN
INTRAMUSCULAR | Status: AC
  Administered 2010-12-13: 07:00:00 via INTRAMUSCULAR
  Filled 2010-12-13: qty 1
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
## 2010-12-13 NOTE — H&P (Addendum)
RANAY KETTER MRN: [id number] DOB/AGE: Mar 29, 1960 50 y.o. Primary Care Physician:SMITH,ZACHARY, DO, DO Admit date: 12/13/2010 Chief Complaint: Altered mental status and combativeness. HPI: The patient is a 50 year old woman with a past medical history significant for chronic depression, anxiety, chronic pain syndrome, obesity, and stage III chronic kidney disease. She is currently combative and encephalopathic, and therefore she is unable to provide any history. Her husband is providing the history. He was recently hospitalized at [facility] for several days for chest pain. Prior to his hospitalization, he noticed that she was acting strangely. She had been sleeping a lot more than usual. Last week, her dose of Celexa was increased from 20 mg to 40 mg daily for treatment of depression and anxiety. During his hospitalization,, he called her at home. She did not answer. He got his brother to go over to the house to see what was going on. She did not come to the door after he tried for approximately 1 hour. He went to her bed room window and knocked on it. Apparently, she was asleep, but was awakened and came to the door. He noticed that there was food on the stove that apparently had been cooking for a number of hours. He said that the patient had been acting strangely and confused but was able to recognized him. The next day, apparently she thought someone had broken into the car, because it had been running all night but there is no gas in it when she tried to drive. He thinks that she wanted to drive but was too confused and did not realize she had left the car running. As far as he can tell, she has had no recent fever, chills, headaches, chest pain, shortness of breath, abdominal pain, nausea, vomiting, diarrhea, or pain with urination. He did notice that her urine smelled very strong and malodorous.  In the emergency department, the patient was initially combative, agitated, and confused. She was  given Geodon and Ativan intravenously. Subsequently, she became somnolent. The CT scan of her head revealed no acute intracranial findings. Her chest x-ray revealed mild right basilar atelectasis. Her EKG revealed sinus tachycardia with a heart rate of 101 beats per minute. Her urine drug screen was positive for benzodiazepines for which she takes chronically. Her alcohol level was less than 11. Her acetaminophen level was negative. Her urinalysis revealed nitrites 7-10 PVCs and many bacteria. Her ABG on 100% oxygen revealed a pH of 7.3, PCO2 of 63, and PO2 of 83.3 following the Ativan and Geodon. Her liver transaminases were elevated with an AST of 8818 and an ALT of 3299. She is being admitted for further evaluation and management.     Past Medical History  Diagnosis Date  . Allergy   . Arthritis   . Depression   . GERD (gastroesophageal reflux disease)   . Hyperlipidemia   . Hypertension   . CKD (chronic kidney disease) stage 3, GFR 30-59 ml/min   . Osteoporosis   . Hypothyroidism   . Anxiety   . Chronic pain syndrome   . Tobacco abuse   . Obesity   . Gastroparesis   . Ankylosing spondylitis   . Nephrolithiasis     Past Surgical History  Procedure Date  . Total knee arthroplasty     left  . Back surgery   . Neck surgery   . Appendectomy   . Cholecystectomy   . Right knee arthroplasty     Prior to Admission medications   Medication Sig  Start Date End Date Taking? Authorizing Provider  ALPRAZolam Prudy Feeler) 0.5 MG tablet Take 0.5 mg by mouth 2 (two) times daily as needed. Anxiety    Historical Provider, MD  aspirin EC 81 MG tablet Take 81 mg by mouth daily.      Historical Provider, MD  Cholecalciferol (VITAMIN D-3) 5000 UNITS TABS Take 1 capsule by mouth daily.      Historical Provider, MD  citalopram (CELEXA) 20 MG tablet Take 2 tablets (40 mg total) by mouth every morning. 12/07/10   [name], DO  cyclobenzaprine (FLEXERIL) 10 MG tablet Take 10 mg by mouth at bedtime.       Historical Provider, MD  docusate sodium (COLACE) 100 MG capsule Take 100 mg by mouth 2 (two) times daily.      Historical Provider, MD  gabapentin (NEURONTIN) 100 MG capsule Take 200 mg by mouth 3 (three) times daily. Take 2 tabs by mouth two times a day    Historical Provider, MD  lansoprazole (PREVACID) 30 MG capsule TAKE 1 CAPSULE BY MOUTH EVERY DAY 11/24/10   [name], DO  levothyroxine (SYNTHROID, LEVOTHROID) 100 MCG tablet Take 1 tablet (100 mcg total) by mouth daily. 05/21/10   [name], DO  loperamide (IMODIUM) 2 MG capsule Take 2 mg by mouth 4 (four) times daily as needed. Diarrhea     Historical Provider, MD  metoCLOPramide (REGLAN) 5 MG tablet Take 5 mg by mouth 4 (four) times daily.  05/10/10   Historical Provider, MD  nortriptyline (PAMELOR) 25 MG capsule TAKE 1 CAPSULE BY MOUTH EVERY NIGHT AT BEDTIME 11/24/10   [name], DO  oxyCODONE (OXYCONTIN) 20 MG 12 hr tablet Take 20 mg by mouth every 12 (twelve) hours.      Historical Provider, MD  pravastatin (PRAVACHOL) 20 MG tablet Take 20 mg by mouth Daily. 10/03/10   Historical Provider, MD    Allergies:  Allergies  Allergen Reactions  . Codeine     REACTION: N/V  . Latex     Family History  Problem Relation Age of Onset  . Diabetes Mother   . Heart failure Mother   . Hyperlipidemia Mother   . Hypertension Mother   . Hyperlipidemia Father   . Cancer Son     lymphoma stage 4 at 73    Social History: She is married. She has 2 grown children. She lives in [place]. She receives disability benefits. She smokes a pack of cigarettes per day. She has no history of illicit drug or alcohol use.     ROS: As above in the history of present illness. Otherwise review of systems is negative.  PHYSICAL EXAM: Blood pressure 115/61, pulse 110, temperature 98.8 F (37.1 C), temperature source Axillary, resp. rate 23, last menstrual period 05/17/2009, SpO2 94.00%.  General: 50 year old obese Caucasian woman who is  currently agitated and delirious. HEENT: Head is normocephalic, nontraumatic. Pupils are slightly dilated, but equal, round, and minimally reactive to light. Extraocular movements are intact. Conjunctivae are clear and sclerae are white. Oropharynx reveals dry mucous membranes. No obvious posterior exudates or erythema. Neck: Supple, slight thyromegaly, no JVD, no bruit, no adenopathy. Lungs: Occasional crackles, cleared with coughing. Loud snoring when she falls back to sleep. Heart: S1, S2, with mild tachycardia. Abdomen: Obese, well-healed right upper quadrant scar. Positive bowel sounds soft nontender and nondistended. Extremities:-Colored tattoo on the lateral right leg. Pedal pulses palpable. No pedal edema. GU: Foley catheter inserted in the emergency department. It is draining cloudy dark  yellow urine. Neurologic: The patient is delirious and lethargic, and intermittently agitated.. She tries to get out of bed. She mumbles. She does not follow directions. She is moving all of her extremities with and without provocation. Cranial nerves II through XII appear to be grossly intact although the exam is somewhat difficult.  Basic Metabolic Panel:  Basename 12/13/10 0630  NA 142  K 4.6  CL 100  CO2 26  GLUCOSE 121*  BUN 20  CREATININE 1.62*  CALCIUM 8.9  MG --  PHOS --   Liver Function Tests:  Basename 12/13/10 0630  AST 8818*  ALT 3299*  ALKPHOS 206*  BILITOT 1.2  PROT 6.0  ALBUMIN 2.9*   No results found for this basename: LIPASE:2,AMYLASE:2 in the last 72 hours  Basename 12/13/10 0919  AMMONIA 76*   CBC:  Basename 12/13/10 0630  WBC 12.4*  NEUTROABS 9.9*  HGB 12.3  HCT 40.0  MCV 105.8*  PLT 218   Cardiac Enzymes: No results found for this basename: CKTOTAL:3,CKMB:3,CKMBINDEX:3,TROPONINI:3 in the last 72 hours BNP: No results found for this basename: POCBNP:3 in the last 72 hours D-Dimer: No results found for this basename: DDIMER:2 in the last 72  hours CBG: No results found for this basename: GLUCAP:6 in the last 72 hours Hemoglobin A1C: No results found for this basename: HGBA1C in the last 72 hours Fasting Lipid Panel: No results found for this basename: CHOL,HDL,LDLCALC,TRIG,CHOLHDL,LDLDIRECT in the last 72 hours Thyroid Function Tests: No results found for this basename: TSH,T4TOTAL,FREET4,T3FREE,THYROIDAB in the last 72 hours Anemia Panel: No results found for this basename: VITAMINB12,FOLATE,FERRITIN,TIBC,IRON,RETICCTPCT in the last 72 hours Coagulation:  Basename 12/13/10 1206  LABPROT 21.1*  INR 1.79*   Urine Drug Screen:  Alcohol Level:  Basename 12/13/10 0630  ETH <11      Recent Results (from the past 240 hour(s))  CULTURE, BLOOD (ROUTINE X 2)     Status: Normal (Preliminary result)   Collection Time   12/13/10  6:45 AM      Component Value Range Status Comment   Specimen Description Blood RIGHT ARM   Final    Special Requests NONE [facility]   Final    Culture PENDING   Incomplete    Report Status PENDING   Incomplete   CULTURE, BLOOD (ROUTINE X 2)     Status: Normal (Preliminary result)   Collection Time   12/13/10  7:15 AM      Component Value Range Status Comment   Specimen Description Blood RIGHT ARM   Final    Special Requests NONE [facility]   Final    Culture PENDING   Incomplete    Report Status PENDING   Incomplete      Results for orders placed during the hospital encounter of 12/13/10 (from the past 48 hour(s))  CBC     Status: Abnormal   Collection Time   12/13/10  6:30 AM      Component Value Range Comment   WBC 12.4 (*) 4.0 - 10.5 (K/uL)    RBC 3.78 (*) 3.87 - 5.11 (MIL/uL)    Hemoglobin 12.3  12.0 - 15.0 (g/dL)    HCT 16.1  09.6 - 04.5 (%)    MCV 105.8 (*) 78.0 - 100.0 (fL)    MCH 32.5  26.0 - 34.0 (pg)    MCHC 30.8  30.0 - 36.0 (g/dL)    RDW 40.9 (*) 81.1 - 15.5 (%)    Platelets 218  150 - 400 (K/uL)   DIFFERENTIAL  Status: Abnormal   Collection Time   12/13/10  6:30 AM       Component Value Range Comment   Neutrophils Relative 80 (*) 43 - 77 (%)    Neutro Abs 9.9 (*) 1.7 - 7.7 (K/uL)    Lymphocytes Relative 14  12 - 46 (%)    Lymphs Abs 1.7  0.7 - 4.0 (K/uL)    Monocytes Relative 6  3 - 12 (%)    Monocytes Absolute 0.7  0.1 - 1.0 (K/uL)    Eosinophils Relative 0  0 - 5 (%)    Eosinophils Absolute 0.0  0.0 - 0.7 (K/uL)    Basophils Relative 0  0 - 1 (%)    Basophils Absolute 0.0  0.0 - 0.1 (K/uL)   COMPREHENSIVE METABOLIC PANEL     Status: Abnormal   Collection Time   12/13/10  6:30 AM      Component Value Range Comment   Sodium 142  135 - 145 (mEq/L)    Potassium 4.6  3.5 - 5.1 (mEq/L)    Chloride 100  96 - 112 (mEq/L)    CO2 26  19 - 32 (mEq/L)    Glucose, Bld 121 (*) 70 - 99 (mg/dL)    BUN 20  6 - 23 (mg/dL)    Creatinine, Ser 4.09 (*) 0.50 - 1.10 (mg/dL)    Calcium 8.9  8.4 - 10.5 (mg/dL)    Total Protein 6.0  6.0 - 8.3 (g/dL)    Albumin 2.9 (*) 3.5 - 5.2 (g/dL)    AST 8119 (*) 0 - 37 (U/L) RESULTS CONFIRMED BY MANUAL DILUTION   ALT 3299 (*) 0 - 35 (U/L)    Alkaline Phosphatase 206 (*) 39 - 117 (U/L)    Total Bilirubin 1.2  0.3 - 1.2 (mg/dL)    GFR calc non Af Amer 36 (*) >90 (mL/min)    GFR calc Af Amer 42 (*) >90 (mL/min)   ETHANOL     Status: Normal   Collection Time   12/13/10  6:30 AM      Component Value Range Comment   Alcohol, Ethyl (B) <11  0 - 11 (mg/dL)   CULTURE, BLOOD (ROUTINE X 2)     Status: Normal (Preliminary result)   Collection Time   12/13/10  6:45 AM      Component Value Range Comment   Specimen Description Blood RIGHT ARM      Special Requests NONE 6CC      Culture PENDING      Report Status PENDING     URINE RAPID DRUG SCREEN (HOSP PERFORMED)     Status: Abnormal   Collection Time   12/13/10  6:55 AM      Component Value Range Comment   Opiates NONE DETECTED  NONE DETECTED     Cocaine NONE DETECTED  NONE DETECTED     Benzodiazepines POSITIVE (*) NONE DETECTED     Amphetamines NONE DETECTED  NONE DETECTED      Tetrahydrocannabinol NONE DETECTED  NONE DETECTED     Barbiturates NONE DETECTED  NONE DETECTED    URINALYSIS, ROUTINE W REFLEX MICROSCOPIC     Status: Abnormal   Collection Time   12/13/10  6:55 AM      Component Value Range Comment   Color, Urine YELLOW  YELLOW     Appearance TURBID (*) CLEAR     Specific Gravity, Urine >1.030 (*) 1.005 - 1.030     pH 6.0  5.0 - 8.0  Glucose, UA NEGATIVE  NEGATIVE (mg/dL)    Hgb urine dipstick MODERATE (*) NEGATIVE     Bilirubin Urine NEGATIVE  NEGATIVE     Ketones, ur TRACE (*) NEGATIVE (mg/dL)    Protein, ur 161 (*) NEGATIVE (mg/dL)    Urobilinogen, UA 4.0 (*) 0.0 - 1.0 (mg/dL)    Nitrite POSITIVE (*) NEGATIVE     Leukocytes, UA NEGATIVE  NEGATIVE    URINE MICROSCOPIC-ADD ON     Status: Abnormal   Collection Time   12/13/10  6:55 AM      Component Value Range Comment   WBC, UA 7-10  <3 (WBC/hpf)    Bacteria, UA MANY (*) RARE     Urine-Other AMORPHOUS URATES/PHOSPHATES     ACETAMINOPHEN LEVEL     Status: Normal   Collection Time   12/13/10  7:00 AM      Component Value Range Comment   Acetaminophen (Tylenol), Serum <15.0  10 - 30 (ug/mL)   SALICYLATE LEVEL     Status: Abnormal   Collection Time   12/13/10  7:00 AM      Component Value Range Comment   Salicylate Lvl 2.0 (*) 2.8 - 20.0 (mg/dL)   CULTURE, BLOOD (ROUTINE X 2)     Status: Normal (Preliminary result)   Collection Time   12/13/10  7:15 AM      Component Value Range Comment   Specimen Description Blood RIGHT ARM      Special Requests NONE 6CC      Culture PENDING      Report Status PENDING     BLOOD GAS, ARTERIAL     Status: Abnormal   Collection Time   12/13/10  8:42 AM      Component Value Range Comment   FIO2 100.00      O2 Content 100.0      Delivery systems NON-REBREATHER OXYGEN MASK      pH, Arterial 7.303 (*) 7.350 - 7.400     pCO2 arterial 62.9 (*) 35.0 - 45.0 (mmHg)    pO2, Arterial 83.3  80.0 - 100.0 (mmHg)    Bicarbonate 30.2 (*) 20.0 - 24.0 (mEq/L)     TCO2 27.9  0 - 100 (mmol/L)    Acid-Base Excess 4.3 (*) 0.0 - 2.0 (mmol/L)    O2 Saturation 94.3      Patient temperature 37.0      Collection site RIGHT RADIAL      Drawn by COLLECTED BY RT      Sample type ARTERIAL      Allens test (pass/fail) PASS  PASS    AMMONIA     Status: Abnormal   Collection Time   12/13/10  9:19 AM      Component Value Range Comment   Ammonia 76 (*) 11 - 60 (umol/L)   BLOOD GAS, ARTERIAL     Status: Abnormal   Collection Time   12/13/10 11:58 AM      Component Value Range Comment   FIO2 50.00      pH, Arterial 7.321 (*) 7.350 - 7.400     pCO2 arterial 58.4 (*) 35.0 - 45.0 (mmHg)    pO2, Arterial 55.5 (*) 80.0 - 100.0 (mmHg)    Bicarbonate 29.3 (*) 20.0 - 24.0 (mEq/L)    TCO2 27.0  0 - 100 (mmol/L)    Acid-Base Excess 3.7 (*) 0.0 - 2.0 (mmol/L)    O2 Saturation 82.9      Patient temperature 37.0      Collection  site RIGHT RADIAL      Drawn by COLLECTED BY RT      Sample type ARTERIAL      Allens test (pass/fail) PASS  PASS    PROTIME-INR     Status: Abnormal   Collection Time   12/13/10 12:06 PM      Component Value Range Comment   Prothrombin Time 21.1 (*) 11.6 - 15.2 (seconds)    INR 1.79 (*) 0.00 - 1.49    APTT     Status: Normal   Collection Time   12/13/10 12:06 PM      Component Value Range Comment   aPTT 31  24 - 37 (seconds)     Ct Head Wo Contrast  12/13/2010  *RADIOLOGY REPORT*  Clinical Data: Altered mental status  CT HEAD WITHOUT CONTRAST  Technique:  Contiguous axial images were obtained from the base of the skull through the vertex without contrast.  Comparison: None.  Findings: There is no mass effect, midline shift, or acute intracranial hemorrhage.  Motion artifact does limit the examination.  Ventricular system and extraaxial space are grossly unremarkable.  Minimal mucosal thickening in the right maxillary sinus and sphenoid sinus.  Mastoid air cells are clear.  Cranium is intact.  IMPRESSION: No acute intracranial pathology.   Original Report Authenticated By: Donavan Burnet, M.D.   US Abdomen Complete  12/13/2010  *RADIOLOGY REPORT*  Clinical Data:  Elevated liver function tests.  Acute renal failure.  ABDOMINAL ULTRASOUND COMPLETE  Comparison:  Renal ultrasound 10/01/2005  Findings:  Gallbladder:  Not visualized, presumably due to prior cholecystectomy.  Common Bile Duct:  Within normal limits in caliber. Measures 6 mm in diameter.  Liver: Diffusely increased parenchymal echogenicity, consistent with hepatic steatosis.  No focal liver mass identified.  Trace perihepatic fluid and right pleural effusion noted.  IVC:  Appears normal.  Pancreas:  No abnormality identified.  Spleen:  Within normal limits in size and echotexture.  Right kidney:  Normal in size and parenchymal echogenicity.  No evidence of mass or hydronephrosis.  Left kidney:  Small left kidney seen measuring 9.4 cm length, as seen on prior study.  Suboptimal visualization due to body habitus and patient's inability cooperate.  No evidence of mass or hydronephrosis.  Abdominal Aorta:  No aneurysm identified.  IMPRESSION:  1.  Prior cholecystectomy.  No evidence of biliary ductal dilatation. 2.  Hepatic steatosis.  No liver mass identified. 3.  Trace perihepatic ascites and right pleural effusion. 4.  Stable small left kidney.  No evidence of hydronephrosis.  Original Report Authenticated By: Danae Orleans, M.D.   Dg Chest Portable 1 View  12/13/2010  *RADIOLOGY REPORT*  Clinical Data: Shortness of breath.  Altered mental status.  PORTABLE CHEST - 1 VIEW  Comparison: 04/14/2007  Findings: Low lung volumes are seen.  Mild atelectasis is seen at the right lung base.  Left lung is clear.  Heart size is normal.  IMPRESSION: Low lung volumes and mild right basilar atelectasis.  Original Report Authenticated By: Danae Orleans, M.D.    Impression:  Principal Problem:  *Encephalopathy acute Active Problems:  Agitation  Acute hepatitis  Acute respiratory failure with  hypercapnia  UTI (lower urinary tract infection)  ARF (acute renal failure)  CKD (chronic kidney disease), stage III  Hypoxia  1. Acute encephalopathy/delirium. The etiology is unclear at this time. The differential diagnoses include encephalopathy from infection, hepatic encephalopathy, medication withdrawal syndrome/recent increase in SSRI, acute hepatitis, and hypoxia/hypercapnia. Her ammonia level is  elevated but not in the range that I think would would explain her symptoms. Her urine drug screen is positive for benzodiazepines, but she was given Ativan in the emergency department and she is treated chronically with Xanax. Celexa was recently increased from 20 mg to 40 mg daily. Of note, her urine drug screen was negative for opiates. She is on chronic OxyContin. According to her husband, she has a history of becoming confused when she is unable to take OxyContin. He believes that because she had been so sleepy over the past several days, she may have not taken it.  Acute hepatitis. The patient's bilirubin is completely within normal limits. She obviously has some type of hepatic injury of unknown etiology. The ultrasound of her abdomen reveals a prior cholecystectomy and hepatic steatosis. She does not abuse Tylenol or any illicit drugs. She does have a tattoo on her right leg. She is on chronic statin therapy. Further investigation will be pursued for the etiology.  Urinary tract infection. She was given Rocephin in the emergency department.  Acute hypoxic and hypercapnic respiratory failure. I suspect that the patient has an element of obesity hypoventilation syndrome and obstructive sleep apnea, with contribution from the antipsychotic and IV benzodiazepines given in the emergency department. Her initial ABG revealed a pH of 7.3 and 8P CO2 of 63, which reveals an element of chronic hypercapnia. She is hypoxic and has severe snoring and therefore, she probably has obstructive sleep apnea the  driven hypoxia.  Stage III chronic kidney disease with an element of acute renal failure compared to her creatinine a few days ago.  Macrocytosis.  Hypothyroidism. Her TSH was low several days ago during her office visit with her primary care physician. We will check a free T4 and another TSH.     Plan:  1. As needed Haldol and Ativan for agitation. BiPAP if needed for probable obstructive sleep apnea and obesity hypoventilation syndrome. We'll titrate the oxygen accordingly. We'll add as needed bronchodilator therapy. We'll continue Rocephin and add vancomycin empirically for treatment of the urinary tract infection. Discontinue medications that could be contributing to her symptoms including Reglan, Flexeril, Neurontin, and Celexa. Will give PPI intravenously. We'll also continue Synthroid intravenously at a lower dose. For further evaluation, we will check another ABG, thyroid function tests, cardiac enzymes, acute viral hepatitis panel, anti- mitochondrial and anti-smooth muscle antibodies, ANA, total iron, ferritin, LDH, and vitamin B12. An ultrasound of her abdomen has already been ordered and the results were dictated above. [facility] consult gastroenterologist, Dr. Darrick Penna.   Critical care time 1 hour, 30 minutes.    Birdella Sippel 12/13/2010, 1:06 PM

## 2010-12-13 NOTE — ED Provider Notes (Addendum)
The patient was received by me in sign out from Dr. Estell Harpin at the beginning of a workup for undifferentiated altered mental status. I am told that the patient arrived agitated, combative, confused, requiring sedation in order to initiate evaluation. She was sedated with Geodon and Ativan. At this time she is somnolent but in no apparent distress. Physical Exam  BP 126/71  Temp(Src) 97 F (36.1 C) (Rectal)  Resp 18  SpO2 97%  LMP 05/17/2009  Physical Exam  Nursing note and vitals reviewed. Constitutional: She appears well-developed and well-nourished. No distress. She is restrained. Face mask in place.  HENT:  Head: Normocephalic and atraumatic.  Mouth/Throat: Oropharynx is clear and moist. No oropharyngeal exudate.  Eyes: EOM are normal. Pupils are equal, round, and reactive to light.  Neck: No tracheal deviation present.  Cardiovascular: Normal rate, regular rhythm and normal heart sounds.   No murmur heard. Pulmonary/Chest: Effort normal and breath sounds normal. No respiratory distress. She has no wheezes. She has no rales.  Musculoskeletal: She exhibits no edema.  Neurological:       She arouses to voice but won't follow commands  Skin: Skin is warm and dry.    ED Course  Procedures DIAGNOSTIC STUDIES: Oxygen Saturation is 97% on face mask, normal by my interpretation.    COORDINATION OF CARE: 07:50 - EDP examined patient at bedside. Patient is currently restrained. She is somnolent, but easily arouses to voice. Doesn't follow commands.  Results for orders placed during the hospital encounter of 12/13/10  CBC      Component Value Range   WBC 12.4 (*) 4.0 - 10.5 (K/uL)   RBC 3.78 (*) 3.87 - 5.11 (MIL/uL)   Hemoglobin 12.3  12.0 - 15.0 (g/dL)   HCT 04.5  40.9 - 81.1 (%)   MCV 105.8 (*) 78.0 - 100.0 (fL)   MCH 32.5  26.0 - 34.0 (pg)   MCHC 30.8  30.0 - 36.0 (g/dL)   RDW 91.4 (*) 78.2 - 15.5 (%)   Platelets 218  150 - 400 (K/uL)  DIFFERENTIAL      Component Value Range     Neutrophils Relative 80 (*) 43 - 77 (%)   Neutro Abs 9.9 (*) 1.7 - 7.7 (K/uL)   Lymphocytes Relative 14  12 - 46 (%)   Lymphs Abs 1.7  0.7 - 4.0 (K/uL)   Monocytes Relative 6  3 - 12 (%)   Monocytes Absolute 0.7  0.1 - 1.0 (K/uL)   Eosinophils Relative 0  0 - 5 (%)   Eosinophils Absolute 0.0  0.0 - 0.7 (K/uL)   Basophils Relative 0  0 - 1 (%)   Basophils Absolute 0.0  0.0 - 0.1 (K/uL)  COMPREHENSIVE METABOLIC PANEL      Component Value Range   Sodium 142  135 - 145 (mEq/L)   Potassium 4.6  3.5 - 5.1 (mEq/L)   Chloride 100  96 - 112 (mEq/L)   CO2 26  19 - 32 (mEq/L)   Glucose, Bld 121 (*) 70 - 99 (mg/dL)   BUN 20  6 - 23 (mg/dL)   Creatinine, Ser 9.56 (*) 0.50 - 1.10 (mg/dL)   Calcium 8.9  8.4 - 21.3 (mg/dL)   Total Protein 6.0  6.0 - 8.3 (g/dL)   Albumin 2.9 (*) 3.5 - 5.2 (g/dL)   AST 0865 (*) 0 - 37 (U/L)   ALT 3299 (*) 0 - 35 (U/L)   Alkaline Phosphatase 206 (*) 39 - 117 (U/L)   Total Bilirubin  1.2  0.3 - 1.2 (mg/dL)   GFR calc non Af Amer 36 (*) >90 (mL/min)   GFR calc Af Amer 42 (*) >90 (mL/min)  ETHANOL      Component Value Range   Alcohol, Ethyl (B) <11  0 - 11 (mg/dL)  CULTURE, BLOOD (ROUTINE X 2)      Component Value Range   Specimen Description Blood RIGHT ARM     Special Requests NONE 6CC     Culture PENDING     Report Status PENDING    CULTURE, BLOOD (ROUTINE X 2)      Component Value Range   Specimen Description Blood RIGHT ARM     Special Requests NONE 6CC     Culture PENDING     Report Status PENDING    ACETAMINOPHEN LEVEL      Component Value Range   Acetaminophen (Tylenol), Serum <15.0  10 - 30 (ug/mL)  SALICYLATE LEVEL      Component Value Range   Salicylate Lvl 2.0 (*) 2.8 - 20.0 (mg/dL)     Ct Head Wo Contrast  12/13/2010  *RADIOLOGY REPORT*  Clinical Data: Altered mental status  CT HEAD WITHOUT CONTRAST  Technique:  Contiguous axial images were obtained from the base of the skull through the vertex without contrast.  Comparison: None.   Findings: There is no mass effect, midline shift, or acute intracranial hemorrhage.  Motion artifact does limit the examination.  Ventricular system and extraaxial space are grossly unremarkable.  Minimal mucosal thickening in the right maxillary sinus and sphenoid sinus.  Mastoid air cells are clear.  Cranium is intact.  IMPRESSION: No acute intracranial pathology.  Original Report Authenticated By: Donavan Burnet, M.D.   Dg Chest Portable 1 View  12/13/2010  *RADIOLOGY REPORT*  Clinical Data: Shortness of breath.  Altered mental status.  PORTABLE CHEST - 1 VIEW  Comparison: 04/14/2007  Findings: Low lung volumes are seen.  Mild atelectasis is seen at the right lung base.  Left lung is clear.  Heart size is normal.  IMPRESSION: Low lung volumes and mild right basilar atelectasis.  Original Report Authenticated By: Danae Orleans, M.D.     Date: 12/13/2010  Rate: 101  Rhythm: sinus tachycardia  QRS Axis: normal  Intervals: normal  ST/T Wave abnormalities: normal  Conduction Disutrbances:none  Narrative Interpretation: Non-provocative EKG  Old EKG Reviewed: unchanged   MDM Acute intoxication, acute psychosis, electrolyte abnormality, hepatic encephalopathy, uremia, infection, brain tumor, seizure, postictal state, mania are all entertained in the differential diagnosis amongst other potential etiologies in this otherwise undifferentiated acute altered mental status.   CRITICAL CARE Performed by: Felisa Bonier   Total critical care time: 30 minutes  Critical care time was exclusive of separately billable procedures and treating other patients.  Critical care was necessary to treat or prevent imminent or life-threatening deterioration.  Critical care was time spent personally by me on the following activities: development of treatment plan with patient and/or surrogate as well as nursing, discussions with consultants, evaluation of patient's response to treatment, examination of  patient, obtaining history from patient or surrogate, ordering and performing treatments and interventions, ordering and review of laboratory studies, ordering and review of radiographic studies, pulse oximetry and re-evaluation of patient's condition.   Felisa Bonier, MD 12/13/10 [phone]  9:16 AM Patient reevaluated by me upon receiving the results of her ABG showing slight hypercarbia, with a nearly normal pH. Her ventilation at this time appears adequate, she is breathing spontaneously at 12-16  times a minute moving good air volumes and with oxygen saturations of 99-100%. She is somnolent at this time but still arousable to touch and voice. At this time she is protecting and maintaining her own airway with an intact gag reflex and intact cough reflex and she does not need intubation. We were at this time able to discontinue her restraints.  Felisa Bonier, MD 12/13/10 0865  Felisa Bonier, MD 12/13/10 [phone]

## 2010-12-13 NOTE — ED Notes (Signed)
Back from CT. Pt arouses easily but will not follow commands or answer questions. Sa02 increased to 95% on NRB. Dime size reddened hard area noted on pubis with no drainage. Pt has bruising to bilateral upper arms. IV infusing right upper arm with no edema or redness.

## 2010-12-13 NOTE — ED Notes (Signed)
Pt sleeping at present. Opens eyes to verbal stimuli but does not follow commands. Pupils 3 mm equal and sluggish to light. Skin warm and dry. Color pink. Respirations deep and even. Breath sounds clear and equal bilaterally. Sa02 86%. Pt placed on 02 at 10L/min via NRB mask.

## 2010-12-13 NOTE — Progress Notes (Signed)
Paged Dr. Sherrie Mustache. Patient received prn's for anxiety/agitation/ trying to climbing out of bed and yelling. No response to mediations. MD made aware. Order received to give Haldol 2mg  IV x1 now. Also, Order frequency changed to every 4hours

## 2010-12-13 NOTE — Progress Notes (Signed)
ANTIBIOTIC CONSULT NOTE - INITIAL  Pharmacy Consult for Vancomycin  Indication: UTI  Allergies  Allergen Reactions  . Codeine     REACTION: N/V  . Latex     PVital Signs: Temp: 98.8 F (37.1 C) (11/22 1100) Temp src: Axillary (11/22 1100) BP: 88/51 mmHg (11/22 1300) Pulse Rate: 78  (11/22 1300) Intake/Output from previous day:   Intake/Output from this shift:    Labs:  Basename 12/13/10 0630  WBC 12.4*  HGB 12.3  PLT 218  LABCREA --  CREATININE 1.62*   The CrCl is unknown because both a height and weight (above a minimum accepted value) are required for this calculation. No results found for this basename: VANCOTROUGH:2,VANCOPEAK:2,VANCORANDOM:2,GENTTROUGH:2,GENTPEAK:2,GENTRANDOM:2,TOBRATROUGH:2,TOBRAPEAK:2,TOBRARND:2,AMIKACINPEAK:2,AMIKACINTROU:2,AMIKACIN:2, in the last 72 hours   Microbiology: Recent Results (from the past 720 hour(s))  CULTURE, BLOOD (ROUTINE X 2)     Status: Normal (Preliminary result)   Collection Time   12/13/10  6:45 AM      Component Value Range Status Comment   Specimen Description Blood RIGHT ARM   Final    Special Requests NONE 6CC   Final    Culture PENDING   Incomplete    Report Status PENDING   Incomplete   CULTURE, BLOOD (ROUTINE X 2)     Status: Normal (Preliminary result)   Collection Time   12/13/10  7:15 AM      Component Value Range Status Comment   Specimen Description Blood RIGHT ARM   Final    Special Requests NONE [facility]   Final    Culture PENDING   Incomplete    Report Status PENDING   Incomplete     Medical History: Past Medical History  Diagnosis Date  . Allergy   . Arthritis   . Depression   . GERD (gastroesophageal reflux disease)   . Hyperlipidemia   . Hypertension   . CKD (chronic kidney disease) stage 3, GFR 30-59 ml/min   . Osteoporosis   . Hypothyroidism   . Anxiety   . Chronic pain syndrome   . Tobacco abuse   . Obesity   . Gastroparesis   . Ankylosing spondylitis   . Nephrolithiasis      Medications:  Scheduled:    . sodium chloride   Intravenous Once  . cefTRIAXone (ROCEPHIN) IV  1 g Intravenous Once  . cefTRIAXone (ROCEPHIN) IV  1 g Intravenous Q24H  . levothyroxine  50 mcg Intravenous Daily  . LORazepam      . nicotine  21 mg Transdermal Daily  . pantoprazole (PROTONIX) IV  40 mg Intravenous Q24H  . vancomycin  1,250 mg Intravenous Q24H  . ziprasidone  10 mg Intramuscular Once  . ziprasidone  10 mg Intramuscular Once   Assessment: Ok for protocol   Goal of Therapy:  Vancomycin trough level 15-20 mcg/ml  Plan:  Start Vancomycin 1250 mg IV every 24 hours Measure antibiotic drug levels at steady state Monitor renal function and adjust dose accordingly  Raquel James, Kahil Agner Bennett 12/13/2010,1:57 PM

## 2010-12-13 NOTE — ED Notes (Signed)
Pt remains sleeping but arouses and opens eyes to verbal stimuli. Respirations deep and even. Breath sounds clear and equal bilaterally. CCM showing NSR. Pt calm and restraints removed at this time.

## 2010-12-13 NOTE — ED Notes (Signed)
Pt remains sedated but opens eyes to stimuli. Pupils 5 mm equal and very sluggish to light. CCM showing NSR. IV infusing with no edema or redness.

## 2010-12-13 NOTE — ED Notes (Signed)
Pt remains drowsy. Responds to verbal and tactile stimuli. Pt showing NSR on CCM. Foley draining turbid dark [name] urine. Pt does not follow commands.  Moving all extremities. 02 via NRB mask at 15 L/min. Pupils remain 5 mm and sluggish to light. IV infusing with no edema or redness.

## 2010-12-13 NOTE — ED Notes (Signed)
Pt arrived via [facility], initial encode reports call for possible od, it was reported pt recently began taking citalopram and has had "bizarre" behavior, tonight EMS reports enroute pt began to become very irratic and aggressive, [employer] and  Security at ambulance bay upon arrival, EDP in upon arrival

## 2010-12-13 NOTE — ED Notes (Signed)
Lab in to draw blood. Pt became alert but remains disoriented. Pt moving about in bed and trying to get up. Spoke with patient's husband via phone. He states that she started "acting weird" after starting on a new nerve pill last Friday. States that she left a pot of beans cooking all night on Friday night. States that he was in the hospital at [facility] from Friday to Monday. States that she spent the night with him at the hospital on Sunday night and he had to keep waking her up and she wasn't acting right. States that the police were waiting for them at the car when he was discharged Monday morning and that she had left the car running and the windows down when she parked Sunday night. States that she slept for 2 1/2 days and woke up occasionally. States that he called EMS because she was just getting worse.

## 2010-12-13 NOTE — Progress Notes (Addendum)
Paged MD. Pt on non-rebreather and oxygen saturations in 60's. Moved pt up in bed and oxygen saturations back at 88%. Respiratory therapy called and at bedside. MD aware. Orders received for Bipap.

## 2010-12-13 NOTE — ED Provider Notes (Signed)
History     CSN: [id number] Arrival date & time: 12/13/2010  5:49 AM   First MD Initiated Contact with Patient 12/13/10 [phone]      Chief Complaint  Patient presents with  . Altered Mental Status    (Consider location/radiation/quality/duration/timing/severity/associated sxs/prior treatment) Patient is a 50 y.o. female presenting with altered mental status. The history is provided by the EMS personnel (ems stated husban found her confused).  Altered Mental Status This is a new problem. The current episode started less than 1 hour ago. The problem occurs constantly. The problem has not changed since onset.Associated symptoms comments: Pt unable to answer questions.    Past Medical History  Diagnosis Date  . Allergy   . Arthritis   . Depression   . GERD (gastroesophageal reflux disease)   . Hyperlipidemia   . Hypertension   . Chronic kidney disease   . Osteoporosis   . Thyroid disease     History reviewed. No pertinent past surgical history.  Family History  Problem Relation Age of Onset  . Diabetes Mother   . Heart failure Mother   . Hyperlipidemia Mother   . Hypertension Mother   . Hyperlipidemia Father   . Cancer Son     lymphoma stage 4 at 30    History  Substance Use Topics  . Smoking status: Current Everyday Smoker -- 0.5 packs/day    Types: Cigarettes  . Smokeless tobacco: Not on file  . Alcohol Use: No    OB History    Grav Para Term Preterm Abortions TAB SAB Ect Mult Living                  Review of Systems  Unable to perform ROS: Mental status change  Psychiatric/Behavioral: Positive for altered mental status.    Allergies  Codeine and Latex  Home Medications   Current Outpatient Rx  Name Route Sig Dispense Refill  . ALPRAZOLAM 0.5 MG PO TABS Oral Take 0.5 mg by mouth 2 (two) times daily as needed.      . ATORVASTATIN CALCIUM 10 MG PO TABS Oral Take 10 mg by mouth daily.      Marland Kitchen CALCIUM-VITAMIN D 500-200 MG-UNIT PO TABS Oral Take 2  tablets by mouth 2 (two) times daily with a meal. 120 tablet 11  . CITALOPRAM HYDROBROMIDE 20 MG PO TABS Oral Take 2 tablets (40 mg total) by mouth every morning. 180 tablet 1  . COLCHICINE 0.6 MG PO TABS Oral Take 0.6 mg by mouth daily.      . CYCLOBENZAPRINE HCL 10 MG PO TABS Oral Take 10 mg by mouth at bedtime.      Marland Kitchen DOCUSATE SODIUM 100 MG PO CAPS Oral Take 100 mg by mouth 2 (two) times daily.      Marland Kitchen GABAPENTIN 100 MG PO CAPS  Take 2 tabs by mouth two times a day     . LANSOPRAZOLE 30 MG PO CPDR  TAKE 1 CAPSULE BY MOUTH EVERY DAY 90 capsule 0  . LEVOTHYROXINE SODIUM 100 MCG PO TABS Oral Take 1 tablet (100 mcg total) by mouth daily. 90 tablet 1  . METOCLOPRAMIDE HCL 5 MG PO TABS      . NORTRIPTYLINE HCL 25 MG PO CAPS  TAKE 1 CAPSULE BY MOUTH EVERY NIGHT AT BEDTIME 90 capsule 0  . OXYCODONE HCL 20 MG PO TB12 Oral Take 20 mg by mouth every 12 (twelve) hours.      Marland Kitchen POLYETHYLENE GLYCOL 3350 PO POWD  Take  17 g (one capful) in 8 oz water 1-4 times per day, increase/decrease to one soft stool daily       LMP 05/17/2009  Physical Exam  Constitutional: She appears well-developed.  HENT:  Head: Normocephalic and atraumatic.  Eyes: Conjunctivae and EOM are normal. No scleral icterus.  Neck: Neck supple. No thyromegaly present.  Cardiovascular: Exam reveals no gallop and no friction rub.   No murmur heard.      tachycardia  Pulmonary/Chest: No stridor. She has no wheezes. She has no rales. She exhibits no tenderness.  Abdominal: She exhibits no distension and no mass.  Musculoskeletal: Normal range of motion. She exhibits no edema.  Lymphadenopathy:    She has no cervical adenopathy.  Neurological: Coordination normal.       Pt is confused.  She is alert and very combative.  She does not answer questions  Skin: No rash noted. No erythema.    ED Course  Procedures (including critical care time)   Labs Reviewed  CBC  DIFFERENTIAL  COMPREHENSIVE METABOLIC PANEL  ETHANOL  URINE RAPID  DRUG SCREEN (HOSP PERFORMED)  URINALYSIS, ROUTINE W REFLEX MICROSCOPIC   No results found.   No diagnosis found.    MDM    Altered mental status.  Will leave with dr. Fredricka Bonine to finish disposition      [name], MD 12/20/10 1126

## 2010-12-13 NOTE — ED Notes (Signed)
CRITICAL VALUE ALERT  Critical value received: PCO2  Date of notification:  12/13/10  Time of notification:  0853  Critical value read back:yes  Nurse who received alert: Rana Snare  MD notified (1st page):  [phone]  Time of first page:   MD notified (2nd page):  Time of second page:  Responding MD: Dr. Doylene Canard  Time MD responded:  [phone]

## 2010-12-13 NOTE — ED Notes (Signed)
Pt arrived extremely agitated and uncooperative, EDP in upon arrival, soft wrist restraints ordered and applied,

## 2010-12-14 ENCOUNTER — Inpatient Hospital Stay (HOSPITAL_COMMUNITY): Payer: BC Managed Care – PPO

## 2010-12-14 DIAGNOSIS — D689 Coagulation defect, unspecified: Secondary | ICD-10-CM | POA: Diagnosis present

## 2010-12-14 DIAGNOSIS — K7290 Hepatic failure, unspecified without coma: Secondary | ICD-10-CM

## 2010-12-14 LAB — HEPATIC FUNCTION PANEL
Albumin: 2.4 g/dL — ABNORMAL LOW (ref 3.5–5.2)
Alkaline Phosphatase: 177 U/L — ABNORMAL HIGH (ref 39–117)
Indirect Bilirubin: 0.6 mg/dL (ref 0.3–0.9)
Total Bilirubin: 1.1 mg/dL (ref 0.3–1.2)

## 2010-12-14 LAB — PROTIME-INR
INR: 1.48 (ref 0.00–1.49)
Prothrombin Time: 18.2 seconds — ABNORMAL HIGH (ref 11.6–15.2)

## 2010-12-14 LAB — CARDIAC PANEL(CRET KIN+CKTOT+MB+TROPI)
CK, MB: 3.8 ng/mL (ref 0.3–4.0)
CK, MB: 4.2 ng/mL — ABNORMAL HIGH (ref 0.3–4.0)

## 2010-12-14 LAB — CBC
HCT: 36.9 % (ref 36.0–46.0)
Hemoglobin: 11.7 g/dL — ABNORMAL LOW (ref 12.0–15.0)
MCH: 33.3 pg (ref 26.0–34.0)
MCHC: 31.7 g/dL (ref 30.0–36.0)
MCV: 105.1 fL — ABNORMAL HIGH (ref 78.0–100.0)

## 2010-12-14 LAB — BASIC METABOLIC PANEL
Calcium: 8.3 mg/dL — ABNORMAL LOW (ref 8.4–10.5)
GFR calc non Af Amer: 59 mL/min — ABNORMAL LOW (ref >90)
Glucose, Bld: 100 mg/dL — ABNORMAL HIGH (ref 70–99)
Sodium: 148 mEq/L — ABNORMAL HIGH (ref 135–145)

## 2010-12-14 LAB — ANTI-SMOOTH MUSCLE ANTIBODY, IGG: F-Actin IgG: 7 U (ref <20)

## 2010-12-14 LAB — BLOOD GAS, ARTERIAL
Acid-Base Excess: 3.1 mmol/L — ABNORMAL HIGH (ref 0.0–2.0)
FIO2: 100 %
O2 Saturation: 86.9 %

## 2010-12-14 LAB — T3, FREE: T3, Free: 1.6 pg/mL — ABNORMAL LOW (ref 2.3–4.2)

## 2010-12-14 LAB — FERRITIN: Ferritin: 4434 ng/mL — ABNORMAL HIGH (ref 10–291)

## 2010-12-14 LAB — HEPATITIS B CORE ANTIBODY, TOTAL: Hep B Core Total Ab: NEGATIVE

## 2010-12-14 LAB — IRON AND TIBC: UIBC: <55 ug/dL — ABNORMAL LOW (ref 125–400)

## 2010-12-14 LAB — HEPATITIS A ANTIBODY, IGM: Hep A IgM: NEGATIVE

## 2010-12-14 MED ORDER — FUROSEMIDE 10 MG/ML IJ SOLN
40.0000 mg | Freq: Once | INTRAMUSCULAR | Status: AC
  Administered 2010-12-14: 40 mg via INTRAVENOUS

## 2010-12-14 MED ORDER — SODIUM CHLORIDE 0.9 % IV SOLN
0.4000 ug/kg/h | INTRAVENOUS | Status: AC
  Administered 2010-12-14: 1 ug/kg/h via INTRAVENOUS
  Administered 2010-12-14: 0.7 ug/kg/h via INTRAVENOUS
  Administered 2010-12-15: 1.1 ug/kg/h via INTRAVENOUS
  Administered 2010-12-15 (×2): 1 ug/kg/h via INTRAVENOUS
  Administered 2010-12-15: 1.2 ug/kg/h via INTRAVENOUS
  Administered 2010-12-15: 1 ug/kg/h via INTRAVENOUS
  Administered 2010-12-15: 0.9 ug/kg/h via INTRAVENOUS
  Administered 2010-12-15: 1.2 ug/kg/h via INTRAVENOUS
  Administered 2010-12-16 (×2): 0.9 ug/kg/h via INTRAVENOUS
  Filled 2010-12-14 (×10): qty 4

## 2010-12-14 MED ORDER — SODIUM CHLORIDE 0.9 % IJ SOLN
INTRAMUSCULAR | Status: AC
  Administered 2010-12-15
  Filled 2010-12-14: qty 3

## 2010-12-14 MED ORDER — SODIUM CHLORIDE 0.9 % IV SOLN
0.2000 ug/kg/h | INTRAVENOUS | Status: DC
  Administered 2010-12-14: 0.3 ug/kg/h via INTRAVENOUS
  Administered 2010-12-14: 0.7 ug/kg/h via INTRAVENOUS
  Filled 2010-12-14 (×4): qty 2

## 2010-12-14 MED ORDER — FUROSEMIDE 10 MG/ML IJ SOLN
40.0000 mg | Freq: Once | INTRAMUSCULAR | Status: DC
Start: 2010-12-14 — End: 2010-12-14
  Filled 2010-12-14: qty 4

## 2010-12-14 MED ORDER — SODIUM CHLORIDE 0.9 % IJ SOLN
INTRAMUSCULAR | Status: AC
  Filled 2010-12-14: qty 9

## 2010-12-14 MED ORDER — VANCOMYCIN HCL 1000 MG IV SOLR
1250.0000 mg | Freq: Two times a day (BID) | INTRAVENOUS | Status: DC
  Administered 2010-12-14 – 2010-12-15 (×2): 1250 mg via INTRAVENOUS
  Filled 2010-12-14 (×7): qty 1250

## 2010-12-14 MED ORDER — SODIUM CHLORIDE 0.9 % IJ SOLN
INTRAMUSCULAR | Status: AC
  Administered 2010-12-14: 03:00:00
  Filled 2010-12-14: qty 3

## 2010-12-14 MED ORDER — DEXMEDETOMIDINE HCL 100 MCG/ML IV SOLN
0.2000 ug/kg/h | INTRAVENOUS | Status: DC
  Administered 2010-12-14: 0.2 ug/kg/h via INTRAVENOUS
  Filled 2010-12-14: qty 2

## 2010-12-14 MED ORDER — LORAZEPAM 2 MG/ML IJ SOLN
INTRAMUSCULAR | Status: AC
  Administered 2010-12-14: 1 mg via INTRAVENOUS
  Filled 2010-12-14: qty 1

## 2010-12-14 MED ORDER — SODIUM CHLORIDE 0.9 % IV SOLN
0.2000 ug/kg/h | INTRAVENOUS | Status: DC
  Filled 2010-12-14: qty 2

## 2010-12-14 MED ORDER — HALOPERIDOL LACTATE 5 MG/ML IJ SOLN
5.0000 mg | Freq: Once | INTRAMUSCULAR | Status: AC
  Administered 2010-12-14: 5 mg via INTRAMUSCULAR

## 2010-12-14 MED ORDER — LORAZEPAM 2 MG/ML IJ SOLN
1.0000 mg | INTRAMUSCULAR | Status: DC | PRN
  Administered 2010-12-14 – 2010-12-17 (×26): 2 mg via INTRAVENOUS
  Administered 2010-12-17: 1 mg via INTRAVENOUS
  Administered 2010-12-17 – 2010-12-21 (×8): 2 mg via INTRAVENOUS
  Administered 2010-12-21: 1 mg via INTRAVENOUS
  Administered 2010-12-21: 2 mg via INTRAVENOUS
  Administered 2010-12-22: 1 mg via INTRAVENOUS
  Administered 2010-12-23 (×3): 2 mg via INTRAVENOUS
  Filled 2010-12-14 (×10): qty 1
  Filled 2010-12-14: qty 2
  Filled 2010-12-14 (×30): qty 1
  Filled 2010-12-14: qty 2

## 2010-12-14 MED ORDER — LORAZEPAM 2 MG/ML IJ SOLN
1.0000 mg | INTRAMUSCULAR | Status: AC | PRN
  Administered 2010-12-14 (×2): 1 mg via INTRAVENOUS

## 2010-12-14 MED ORDER — SODIUM CHLORIDE 0.9 % IJ SOLN
INTRAMUSCULAR | Status: AC
  Administered 2010-12-14: 20:00:00
  Filled 2010-12-14: qty 3

## 2010-12-14 MED ORDER — HALOPERIDOL LACTATE 5 MG/ML IJ SOLN
1.0000 mg | Freq: Four times a day (QID) | INTRAMUSCULAR | Status: DC | PRN
  Administered 2010-12-14: 1 mg via INTRAVENOUS

## 2010-12-14 MED ORDER — LORAZEPAM 2 MG/ML IJ SOLN
1.0000 mg | INTRAMUSCULAR | Status: DC | PRN
  Administered 2010-12-14: 1 mg via INTRAVENOUS

## 2010-12-14 MED ORDER — SODIUM CHLORIDE 0.9 % IJ SOLN
INTRAMUSCULAR | Status: AC
  Administered 2010-12-14: 23:00:00
  Filled 2010-12-14: qty 3

## 2010-12-14 NOTE — Progress Notes (Addendum)
Subjective: Unable.  Per nursing, somnolent today. Agitated yesterday. Desaturated off BiPAP  Objective: Vital signs in last 24 hours: Filed Vitals:   12/14/10 0703 12/14/10 0730 12/14/10 0800 12/14/10 0830  BP: 98/49 95/56 103/54 113/66  Pulse: 81 82 87 95  Temp:      TempSrc:      Resp:  11 10 13   Height:      Weight:      SpO2: 94% 92% 93% 96%   Weight change:   Intake/Output Summary (Last 24 hours) at 12/14/10 0930 Last data filed at 12/14/10 0700  Gross per 24 hour  Intake   2375 ml  Output    500 ml  Net   1875 ml   Physical Exam:  General: Somnolent. On BiPAP Lungs: Clear to auscultation bilaterally without wheeze rhonchi or rales Cardiovascular regular rate rhythm without murmurs gallops rubs Abdomen soft nontender nondistended Extremities trace edema. Peripheral IV in her foot.   Lab Results: Basic Metabolic Panel:  Lab 12/14/10 1610 12/13/10 0630  NA 148* 142  K 4.1 4.6  CL 108 100  CO2 31 26  GLUCOSE 100* 121*  BUN 17 20  CREATININE 1.07 1.62*  CALCIUM 8.3* 8.9  MG -- --  PHOS -- --   Liver Function Tests:  Lab 12/14/10 0458 12/13/10 0630  AST 1996* 8818*  ALT 1681* 3299*  ALKPHOS 177* 206*  BILITOT 1.1 1.2  PROT 5.0* 6.0  ALBUMIN 2.4* 2.9*   No results found for this basename: LIPASE:2,AMYLASE:2 in the last 168 hours  Lab 12/13/10 0919  AMMONIA 76*   CBC:  Lab 12/14/10 0458 12/13/10 0630  WBC 7.4 12.4*  NEUTROABS -- 9.9*  HGB 11.7* 12.3  HCT 36.9 40.0  MCV 105.1* 105.8*  PLT 204 218   Cardiac Enzymes:  Lab 12/14/10 0759 12/13/10 2346 12/13/10 1551  CKTOTAL 475* 679* 575*  CKMB 3.8 4.2* 4.4*  CKMBINDEX -- -- --  TROPONINI <0.30 <0.30 <0.30   BNP: No results found for this basename: POCBNP:3 in the last 168 hours D-Dimer: No results found for this basename: DDIMER:2 in the last 168 hours CBG: No results found for this basename: GLUCAP:6 in the last 168 hours Hemoglobin A1C: No results found for this basename: HGBA1C in  the last 168 hours Fasting Lipid Panel:  Lab 12/07/10 0942  CHOL --  HDL --  LDLCALC --  TRIG --  CHOLHDL --  LDLDIRECT 102*   Thyroid Function Tests:  Lab 12/13/10 1206  TSH 0.151*  T4TOTAL --  FREET4 0.88  T3FREE --  THYROIDAB --   Coagulation:  Lab 12/13/10 2346 12/13/10 1206  LABPROT 19.9* 21.1*  INR 1.66* 1.79*   Anemia Panel:  Lab 12/13/10 1206  VITAMINB12 >2000*  FOLATE --  FERRITIN 4434*  TIBC NOT CALC  IRON 283*  RETICCTPCT --   Urine Drug Screen: Drugs of Abuse     Component Value Date/Time   LABOPIA NONE DETECTED 12/13/2010 0655   COCAINSCRNUR NONE DETECTED 12/13/2010 0655   LABBENZ POSITIVE* 12/13/2010 0655   AMPHETMU NONE DETECTED 12/13/2010 0655   THCU NONE DETECTED 12/13/2010 0655   LABBARB NONE DETECTED 12/13/2010 0655    Alcohol Level:  Lab 12/13/10 0630  ETH <11   Hepatitis B surface antigen negative hepatitis B surface antibody negative. Hepatitis C antibody negative. Alpha I antitrypsin 199 which is normal  Micro Results: Recent Results (from the past 240 hour(s))  CULTURE, BLOOD (ROUTINE X 2)     Status: Normal (Preliminary  result)   Collection Time   12/13/10  6:45 AM      Component Value Range Status Comment   Specimen Description Blood RIGHT ARM   Final    Special Requests NONE 6CC   Final    Culture PENDING   Incomplete    Report Status PENDING   Incomplete   CULTURE, BLOOD (ROUTINE X 2)     Status: Normal (Preliminary result)   Collection Time   12/13/10  7:15 AM      Component Value Range Status Comment   Specimen Description Blood RIGHT ARM   Final    Special Requests NONE 6CC   Final    Culture PENDING   Incomplete    Report Status PENDING   Incomplete   MRSA PCR SCREENING     Status: Normal   Collection Time   12/13/10  1:45 PM      Component Value Range Status Comment   MRSA by PCR NEGATIVE  NEGATIVE  Final    Studies/Results: Ct Head Wo Contrast  12/13/2010  *RADIOLOGY REPORT*  Clinical Data: Altered  mental status  CT HEAD WITHOUT CONTRAST  Technique:  Contiguous axial images were obtained from the base of the skull through the vertex without contrast.  Comparison: None.  Findings: There is no mass effect, midline shift, or acute intracranial hemorrhage.  Motion artifact does limit the examination.  Ventricular system and extraaxial space are grossly unremarkable.  Minimal mucosal thickening in the right maxillary sinus and sphenoid sinus.  Mastoid air cells are clear.  Cranium is intact.  IMPRESSION: No acute intracranial pathology.  Original Report Authenticated By: Donavan Burnet, M.D.   US Abdomen Complete  12/13/2010  *RADIOLOGY REPORT*  Clinical Data:  Elevated liver function tests.  Acute renal failure.  ABDOMINAL ULTRASOUND COMPLETE  Comparison:  Renal ultrasound 10/01/2005  Findings:  Gallbladder:  Not visualized, presumably due to prior cholecystectomy.  Common Bile Duct:  Within normal limits in caliber. Measures 6 mm in diameter.  Liver: Diffusely increased parenchymal echogenicity, consistent with hepatic steatosis.  No focal liver mass identified.  Trace perihepatic fluid and right pleural effusion noted.  IVC:  Appears normal.  Pancreas:  No abnormality identified.  Spleen:  Within normal limits in size and echotexture.  Right kidney:  Normal in size and parenchymal echogenicity.  No evidence of mass or hydronephrosis.  Left kidney:  Small left kidney seen measuring 9.4 cm length, as seen on prior study.  Suboptimal visualization due to body habitus and patient's inability cooperate.  No evidence of mass or hydronephrosis.  Abdominal Aorta:  No aneurysm identified.  IMPRESSION:  1.  Prior cholecystectomy.  No evidence of biliary ductal dilatation. 2.  Hepatic steatosis.  No liver mass identified. 3.  Trace perihepatic ascites and right pleural effusion. 4.  Stable small left kidney.  No evidence of hydronephrosis.  Original Report Authenticated By: Danae Orleans, M.D.   Dg Chest Portable 1  View  12/13/2010  *RADIOLOGY REPORT*  Clinical Data: Shortness of breath.  Altered mental status.  PORTABLE CHEST - 1 VIEW  Comparison: 04/14/2007  Findings: Low lung volumes are seen.  Mild atelectasis is seen at the right lung base.  Left lung is clear.  Heart size is normal.  IMPRESSION: Low lung volumes and mild right basilar atelectasis.  Original Report Authenticated By: Danae Orleans, M.D.   Scheduled Meds:   . cefTRIAXone (ROCEPHIN) IV  1 g Intravenous Once  . cefTRIAXone (ROCEPHIN) IV  1  g Intravenous Q24H  . haloperidol lactate  2 mg Intravenous Once  . haloperidol lactate  5 mg Intramuscular Once  . lactulose  300 mL Rectal Q6H  . levothyroxine  50 mcg Intravenous Daily  . nicotine  21 mg Transdermal Daily  . pantoprazole (PROTONIX) IV  40 mg Intravenous Q24H  . phytonadione (VITAMIN K) IV  10 mg Intravenous Once  . sodium chloride      . sodium chloride      . sodium chloride      . vancomycin  1,250 mg Intravenous Q12H  . DISCONTD: vancomycin  1,250 mg Intravenous Q24H   Continuous Infusions:   . sodium chloride 100 mL/hr at 12/14/10 0400   PRN Meds:.albuterol, haloperidol lactate, HYDROmorphone, LORazepam, LORazepam, ondansetron (ZOFRAN) IV, ondansetron, DISCONTD: acetaminophen, DISCONTD: acetaminophen, DISCONTD: haloperidol lactate, DISCONTD: LORazepam Assessment/Plan: Principal Problem:  *Encephalopathy acute, probably multifactorial. Hypoxic hypercarbic respiratory failure, hepatic encephalopathy, rule out drug withdrawal or effect. UTI could be contributing as well. Minimize sedation as able. Active Problems:  Acute hepatitis, transaminases improving. Could be shock liver from prolonged hypoxia or poor perfusion. Could be medication effect. INR pending. Patient was not accepted at tertiary facility.  Acute respiratory failure with hypercapnia and hypoxia: Chest x-ray was negative. Will repeat and check a BNP.  HYPERTENSION, BENIGN SYSTEMIC: Medications held  UTI  (lower urinary tract infection): Continue antibiotics and followup culture results  ARF (acute renal failure), resolved, prerenal.  Coagulopathy: INR pending  HYPERCHOLESTEROLEMIA: Statins stopped  OBESITY, NOS  ANXIETY: On chronic benzodiazepines  SMOKER: Not wheezing currently  Ankylosing spondylitis  CKD (chronic kidney disease), stage III  Macrocytosis  Hypothyroidism: TSH is low with a normal free T4. We'll check free T3, but suspect euthyroid sick syndrome.  Critical care time 40 minutes.   LOS: 1 day   Elisavet Buehrer L 12/14/2010, 9:30 AM

## 2010-12-14 NOTE — Progress Notes (Signed)
ANTIBIOTIC CONSULT NOTE  Pharmacy Consult for Vancomycin  Indication: UTI  Allergies  Allergen Reactions  . Codeine     REACTION: N/V  . Latex     PVital Signs: BP: 113/66 mmHg (11/23 0830) Pulse Rate: 95  (11/23 0830) Intake/Output from previous day: 11/22 0701 - 11/23 0700 In: 2375 [I.V.:1525; IV Piggyback:350] Out: 500 [Urine:500] Intake/Output from this shift:    Labs:  [facility] 12/14/10 0458 12/13/10 0630  WBC 7.4 12.4*  HGB 11.7* 12.3  PLT 204 218  LABCREA -- --  CREATININE 1.07 1.62*   Estimated Creatinine Clearance: 68.7 ml/min (by C-G formula based on Cr of 1.07). No results found for this basename: VANCOTROUGH:2,VANCOPEAK:2,VANCORANDOM:2,GENTTROUGH:2,GENTPEAK:2,GENTRANDOM:2,TOBRATROUGH:2,TOBRAPEAK:2,TOBRARND:2,AMIKACINPEAK:2,AMIKACINTROU:2,AMIKACIN:2, in the last 72 hours   Microbiology: Recent Results (from the past 720 hour(s))  CULTURE, BLOOD (ROUTINE X 2)     Status: Normal (Preliminary result)   Collection Time   12/13/10  6:45 AM      Component Value Range Status Comment   Specimen Description Blood RIGHT ARM   Final    Special Requests NONE 6CC   Final    Culture PENDING   Incomplete    Report Status PENDING   Incomplete   CULTURE, BLOOD (ROUTINE X 2)     Status: Normal (Preliminary result)   Collection Time   12/13/10  7:15 AM      Component Value Range Status Comment   Specimen Description Blood RIGHT ARM   Final    Special Requests NONE [facility]   Final    Culture PENDING   Incomplete    Report Status PENDING   Incomplete   MRSA PCR SCREENING     Status: Normal   Collection Time   12/13/10  1:45 PM      Component Value Range Status Comment   MRSA by PCR NEGATIVE  NEGATIVE  Final     Medical History: Past Medical History  Diagnosis Date  . Allergy   . Arthritis   . Depression   . GERD (gastroesophageal reflux disease)   . Hyperlipidemia   . Hypertension   . CKD (chronic kidney disease) stage 3, GFR 30-59 ml/min   . Osteoporosis   .  Hypothyroidism   . Anxiety   . Chronic pain syndrome   . Tobacco abuse   . Obesity   . Gastroparesis   . Ankylosing spondylitis   . Nephrolithiasis     Medications:  Scheduled:     . cefTRIAXone (ROCEPHIN) IV  1 g Intravenous Once  . cefTRIAXone (ROCEPHIN) IV  1 g Intravenous Q24H  . haloperidol lactate  2 mg Intravenous Once  . haloperidol lactate  5 mg Intramuscular Once  . lactulose  300 mL Rectal Q6H  . levothyroxine  50 mcg Intravenous Daily  . nicotine  21 mg Transdermal Daily  . pantoprazole (PROTONIX) IV  40 mg Intravenous Q24H  . phytonadione (VITAMIN K) IV  10 mg Intravenous Once  . sodium chloride      . sodium chloride      . sodium chloride      . vancomycin  1,250 mg Intravenous Q24H   Assessment: Ok for protocol Renal function improved   Goal of Therapy:  Vancomycin trough level 15-20 mcg/ml  Plan:  Change  Vancomycin to  1250 mg IV every 12 hours Measure antibiotic drug levels at steady state Monitor renal function and adjust dose accordingly  Raquel James, Paulla Mcclaskey Bennett 12/14/2010,9:25 AM

## 2010-12-14 NOTE — Progress Notes (Signed)
Subjective: Remains agitated/obtunded. Requiring BiPAP. Opens eyes to physical stimuli. No verbalization.  Objective: Vital signs in last 24 hours: Temp:  [98.8 F (37.1 C)] 98.8 F (37.1 C) (11/22 1100) Pulse Rate:  [78-129] 95  (11/23 0830) Resp:  [10-28] 13  (11/23 0830) BP: (88-137)/(44-89) 113/66 mmHg (11/23 0830) SpO2:  [86 %-99 %] 96 % (11/23 0830) FiO2 (%):  [15 %-40 %] 40 % (11/22 1300) Weight:  [215 lb 9.8 oz (97.8 kg)] 215 lb 9.8 oz (97.8 kg) (11/22 1100) Last BM Date: 12/14/10 General:   Abdomen:  Soft, nontender and nondistended.  Normal bowel sounds, without guarding, and without rebound.  No mass or organomegaly. Extremities:  Without clubbing or edema.    Intake/Output from previous day: 11/22 0701 - 11/23 0700 In: 2375 [I.V.:1525; IV Piggyback:350] Out: 500 [Urine:500] Intake/Output this shift:    Lab Results:  Basename 12/14/10 0458 12/13/10 0630  WBC 7.4 12.4*  HGB 11.7* 12.3  HCT 36.9 40.0  PLT 204 218   BMET  Basename 12/14/10 0458 12/13/10 0630  NA 148* 142  K 4.1 4.6  CL 108 100  CO2 31 26  GLUCOSE 100* 121*  BUN 17 20  CREATININE 1.07 1.62*  CALCIUM 8.3* 8.9   LFT  Basename 12/14/10 0458  PROT 5.0*  ALBUMIN 2.4*  AST 1996*  ALT 1681*  ALKPHOS 177*  BILITOT 1.1  BILIDIR 0.5*  IBILI 0.6   PT/INR  Basename 12/14/10 0949 12/13/10 2346  LABPROT 18.2* 19.9*  INR 1.48 1.66*   Hepatitis Panel  Basename 12/13/10 1206  HEPBSAG NEGATIVE  HCVAB NEGATIVE  HEPAIGM --  HEPBIGM --   C-Diff No results found for this basename: CDIFFTOX:3 in the last 72 hours  Studies/Results: Ct Head Wo Contrast  12/13/2010  *RADIOLOGY REPORT*  Clinical Data: Altered mental status  CT HEAD WITHOUT CONTRAST  Technique:  Contiguous axial images were obtained from the base of the skull through the vertex without contrast.  Comparison: None.  Findings: There is no mass effect, midline shift, or acute intracranial hemorrhage.  Motion artifact does limit  the examination.  Ventricular system and extraaxial space are grossly unremarkable.  Minimal mucosal thickening in the right maxillary sinus and sphenoid sinus.  Mastoid air cells are clear.  Cranium is intact.  IMPRESSION: No acute intracranial pathology.  Original Report Authenticated By: Donavan Burnet, M.D.   US Abdomen Complete  12/13/2010  *RADIOLOGY REPORT*  Clinical Data:  Elevated liver function tests.  Acute renal failure.  ABDOMINAL ULTRASOUND COMPLETE  Comparison:  Renal ultrasound 10/01/2005  Findings:  Gallbladder:  Not visualized, presumably due to prior cholecystectomy.  Common Bile Duct:  Within normal limits in caliber. Measures 6 mm in diameter.  Liver: Diffusely increased parenchymal echogenicity, consistent with hepatic steatosis.  No focal liver mass identified.  Trace perihepatic fluid and right pleural effusion noted.  IVC:  Appears normal.  Pancreas:  No abnormality identified.  Spleen:  Within normal limits in size and echotexture.  Right kidney:  Normal in size and parenchymal echogenicity.  No evidence of mass or hydronephrosis.  Left kidney:  Small left kidney seen measuring 9.4 cm length, as seen on prior study.  Suboptimal visualization due to body habitus and patient's inability cooperate.  No evidence of mass or hydronephrosis.  Abdominal Aorta:  No aneurysm identified.  IMPRESSION:  1.  Prior cholecystectomy.  No evidence of biliary ductal dilatation. 2.  Hepatic steatosis.  No liver mass identified. 3.  Trace perihepatic ascites and right  pleural effusion. 4.  Stable small left kidney.  No evidence of hydronephrosis.  Original Report Authenticated By: Danae Orleans, M.D.   Dg Chest Portable 1 View  12/13/2010  *RADIOLOGY REPORT*  Clinical Data: Shortness of breath.  Altered mental status.  PORTABLE CHEST - 1 VIEW  Comparison: 04/14/2007  Findings: Low lung volumes are seen.  Mild atelectasis is seen at the right lung base.  Left lung is clear.  Heart size is normal.   IMPRESSION: Low lung volumes and mild right basilar atelectasis.  Original Report Authenticated By: Danae Orleans, M.D.    Assessment: Principal Problem:  *Encephalopathy acute Active Problems:  HYPERCHOLESTEROLEMIA  OBESITY, NOS  ANXIETY  SMOKER  HYPERTENSION, BENIGN SYSTEMIC  Ankylosing spondylitis  Acute hepatitis  Acute respiratory failure with hypercapnia  UTI (lower urinary tract infection)  ARF (acute renal failure)  CKD (chronic kidney disease), stage III  Hypoxia  Macrocytosis  Hypothyroidism  Coagulopathy  Impression: This lady's clinical presentation is most consistent with shock liver or ischemic hepatopathy compounded by a toxic and/or metabolic encephalopathy. I suspect medication effect and/or medication withdrawal syndrome on top of infection (urinary tract). She also likely has mild rhabdomyolysis. Multiple doses of Dilantin Haldol and Ativan yesterday likely clouding the clinical picture further.  I do not believe this lady has advanced chronic liver disease or acute hepatic failure particularly with plummeting  aminotransferases and normalization of her INR. I do not feel that she needs referral to a tertiary/transplant center specifically for her liver at this time.   Recommendations: Continue very good ICU nursing care and support.  Minimize the use of CNS active medications. I would anticipate seeing clinical improvement in the next 24-48 hours.  Would check a hepatic profile again tomorrow morning  Discussed at length with Dr. Lendell Caprice at the nurses station in the ICU this morning.     Plan:   LOS: 1 day   Eula Listen  12/14/2010, 10:28 AM

## 2010-12-14 NOTE — Progress Notes (Signed)
Pt's husband in room assisting to calm and restrain pt. Pt is yelling and moaning, kicking flailing arms. Pt is four point soft wrist restraints. Pt is not responsive to verbal commands.

## 2010-12-15 ENCOUNTER — Inpatient Hospital Stay (HOSPITAL_COMMUNITY): Payer: BC Managed Care – PPO

## 2010-12-15 LAB — COMPREHENSIVE METABOLIC PANEL
Alkaline Phosphatase: 174 U/L — ABNORMAL HIGH (ref 39–117)
BUN: 23 mg/dL (ref 6–23)
Calcium: 8.6 mg/dL (ref 8.4–10.5)
GFR calc Af Amer: 60 mL/min — ABNORMAL LOW (ref >90)
GFR calc non Af Amer: 52 mL/min — ABNORMAL LOW (ref >90)
Glucose, Bld: 138 mg/dL — ABNORMAL HIGH (ref 70–99)
Total Protein: 5.4 g/dL — ABNORMAL LOW (ref 6.0–8.3)

## 2010-12-15 LAB — URINE CULTURE
Colony Count: >=100000
Culture  Setup Time: 201211222213

## 2010-12-15 LAB — PROTIME-INR: Prothrombin Time: 18.4 seconds — ABNORMAL HIGH (ref 11.6–15.2)

## 2010-12-15 LAB — BILIRUBIN, DIRECT: Bilirubin, Direct: 0.5 mg/dL — ABNORMAL HIGH (ref 0.0–0.3)

## 2010-12-15 MED ORDER — SODIUM CHLORIDE 0.9 % IJ SOLN
INTRAMUSCULAR | Status: AC
  Filled 2010-12-15: qty 3

## 2010-12-15 MED ORDER — DEXTROSE 5 % IV SOLN
INTRAVENOUS | Status: AC
  Filled 2010-12-15: qty 500

## 2010-12-15 MED ORDER — SODIUM CHLORIDE 0.9 % IJ SOLN
INTRAMUSCULAR | Status: AC
  Administered 2010-12-15: 06:00:00
  Filled 2010-12-15: qty 3

## 2010-12-15 MED ORDER — IOHEXOL 300 MG/ML  SOLN
100.0000 mL | Freq: Once | INTRAMUSCULAR | Status: AC | PRN
  Administered 2010-12-15: 100 mL via INTRAVENOUS

## 2010-12-15 MED ORDER — SODIUM CHLORIDE 0.9 % IJ SOLN
10.0000 mL | Freq: Two times a day (BID) | INTRAMUSCULAR | Status: DC
  Administered 2010-12-15 – 2010-12-24 (×12): 10 mL
  Filled 2010-12-15 (×19): qty 3

## 2010-12-15 MED ORDER — BIOTENE DRY MOUTH MT LIQD
15.0000 mL | OROMUCOSAL | Status: DC
  Administered 2010-12-15 – 2010-12-24 (×47): 15 mL via OROMUCOSAL

## 2010-12-15 MED ORDER — LORAZEPAM 2 MG/ML IJ SOLN
2.0000 mg | Freq: Once | INTRAMUSCULAR | Status: AC
  Administered 2010-12-15: 2 mg via INTRAVENOUS

## 2010-12-15 MED ORDER — SODIUM CHLORIDE 0.45 % IV SOLN
INTRAVENOUS | Status: DC
  Administered 2010-12-15: 10 mL via INTRAVENOUS
  Administered 2010-12-17: via INTRAVENOUS

## 2010-12-15 MED ORDER — PIPERACILLIN-TAZOBACTAM 3.375 G IVPB
INTRAVENOUS | Status: AC
  Filled 2010-12-15: qty 100

## 2010-12-15 MED ORDER — DEXTROSE 5 % IV SOLN
500.0000 mg | INTRAVENOUS | Status: DC
  Administered 2010-12-15 – 2010-12-23 (×9): 500 mg via INTRAVENOUS
  Filled 2010-12-15 (×10): qty 500

## 2010-12-15 MED ORDER — DEXMEDETOMIDINE HCL 100 MCG/ML IV SOLN
INTRAVENOUS | Status: AC
  Filled 2010-12-15: qty 4

## 2010-12-15 MED ORDER — SODIUM CHLORIDE 0.9 % IJ SOLN
10.0000 mL | INTRAMUSCULAR | Status: DC | PRN
  Administered 2010-12-17 – 2010-12-21 (×5): 10 mL
  Filled 2010-12-15 (×3): qty 3
  Filled 2010-12-15: qty 6
  Filled 2010-12-15 (×8): qty 3

## 2010-12-15 MED ORDER — ZIPRASIDONE MESYLATE 20 MG IM SOLR
20.0000 mg | Freq: Once | INTRAMUSCULAR | Status: AC
  Administered 2010-12-15: 20 mg via INTRAMUSCULAR
  Filled 2010-12-15: qty 20

## 2010-12-15 MED ORDER — PIPERACILLIN-TAZOBACTAM 3.375 G IVPB
3.3750 g | Freq: Three times a day (TID) | INTRAVENOUS | Status: DC
  Administered 2010-12-15 – 2010-12-23 (×24): 3.375 g via INTRAVENOUS
  Filled 2010-12-15 (×28): qty 50

## 2010-12-15 NOTE — Progress Notes (Signed)
Pt still combative at this hour

## 2010-12-15 NOTE — Progress Notes (Signed)
Subjective: Remains agitated; remains in physical restraints. Ativan drip and Precedex.  Objective: Vital signs in last 24 hours: Temp:  [98.4 F (36.9 C)-100.6 F (38.1 C)] 100.6 F (38.1 C) (11/24 0800) Pulse Rate:  [58-128] 62  (11/24 0800) Resp:  [17-34] 27  (11/24 0800) BP: (107-157)/(61-79) 152/79 mmHg (11/24 0800) SpO2:  [83 %-99 %] 94 % (11/24 0800) Weight:  [216 lb 4.3 oz (98.1 kg)] 216 lb 4.3 oz (98.1 kg) (11/24 0500) Last BM Date: 12/14/10 General:   Obtunded/agitated. No verbalization. Abdomen:  Soft, no obvious mass organomegaly Extremities:  Without clubbing or edema.    Intake/Output from previous day: 11/23 0701 - 11/24 0700 In: 4040.8 [I.V.:1736.8; IV Piggyback:304] Out: 7700 [Urine:4000; Stool:3700] Intake/Output this shift: Total I/O In: 101.5 [I.V.:101.5] Out: -   Lab Results:  Basename 12/14/10 0458 12/13/10 0630  WBC 7.4 12.4*  HGB 11.7* 12.3  HCT 36.9 40.0  PLT 204 218   BMET  Basename 12/15/10 0524 12/14/10 0458 12/13/10 0630  NA 152* 148* 142  K 3.5 4.1 4.6  CL 108 108 100  CO2 32 31 26  GLUCOSE 138* 100* 121*  BUN 23 17 20   CREATININE 1.20* 1.07 1.62*  CALCIUM 8.6 8.3* 8.9   LFT  Basename 12/15/10 0524 12/14/10 0458  PROT 5.4* --  ALBUMIN 2.6* --  AST 569* --  ALT 1190* --  ALKPHOS 174* --  BILITOT 1.2 --  BILIDIR 0.5* --  IBILI -- 0.6   PT/INR  Basename 12/15/10 0524 12/14/10 0949  LABPROT 18.4* 18.2*  INR 1.50* 1.48   Hepatitis Panel  Basename 12/13/10 1422 12/13/10 1206  HEPBSAG -- NEGATIVE  HCVAB -- NEGATIVE  HEPAIGM NEGATIVE --  HEPBIGM -- --    Studies/Results: US Abdomen Complete  12/13/2010  *RADIOLOGY REPORT*  Clinical Data:  Elevated liver function tests.  Acute renal failure.  ABDOMINAL ULTRASOUND COMPLETE  Comparison:  Renal ultrasound 10/01/2005  Findings:  Gallbladder:  Not visualized, presumably due to prior cholecystectomy.  Common Bile Duct:  Within normal limits in caliber. Measures 6 mm in  diameter.  Liver: Diffusely increased parenchymal echogenicity, consistent with hepatic steatosis.  No focal liver mass identified.  Trace perihepatic fluid and right pleural effusion noted.  IVC:  Appears normal.  Pancreas:  No abnormality identified.  Spleen:  Within normal limits in size and echotexture.  Right kidney:  Normal in size and parenchymal echogenicity.  No evidence of mass or hydronephrosis.  Left kidney:  Small left kidney seen measuring 9.4 cm length, as seen on prior study.  Suboptimal visualization due to body habitus and patient's inability cooperate.  No evidence of mass or hydronephrosis.  Abdominal Aorta:  No aneurysm identified.  IMPRESSION:  1.  Prior cholecystectomy.  No evidence of biliary ductal dilatation. 2.  Hepatic steatosis.  No liver mass identified. 3.  Trace perihepatic ascites and right pleural effusion. 4.  Stable small left kidney.  No evidence of hydronephrosis.  Original Report Authenticated By: Danae Orleans, M.D.   Dg Chest Port 1 View  12/14/2010  *RADIOLOGY REPORT*  Clinical Data: Respiratory failure.  PORTABLE CHEST - 1 VIEW  Comparison: 12/13/2010  Findings: The patient has increased atelectasis at both lung bases without effusions or lung consolidation.  Heart size and vascularity are normal.  IMPRESSION: Increasing bibasilar atelectasis.  Original Report Authenticated By: Gwynn Burly, M.D.    Assessment: Principal Problem:  *Encephalopathy acute Active Problems:  HYPERCHOLESTEROLEMIA  OBESITY, NOS  ANXIETY  SMOKER  HYPERTENSION, BENIGN  SYSTEMIC  Ankylosing spondylitis  Acute hepatitis  Acute respiratory failure with hypercapnia  UTI (lower urinary tract infection)  ARF (acute renal failure)  CKD (chronic kidney disease), stage III  Hypoxia  Macrocytosis  Hypothyroidism  Coagulopathy  Toxic/metabolic encephalopathy - likely drug withdrawal plus minus toxicity. Acute hepatic injury secondary to ischemia ( shock liver) - recovering as  expected. INR has remained essentially normal. No evidence of liver failure otherwise.   Recommendations: Continue close ICU support. Continue to minimize CNS active medications.  We'll check hepatic function profile and INR tomorrow morning.   LOS: 2 days   Eula Listen  12/15/2010, 10:59 AM

## 2010-12-15 NOTE — Progress Notes (Signed)
Pt placed back on bipap 12/6 fio2 100 pt is combative and fighting . Bipap placed back on pt after Sats decreased in to 76 while on 40% venti mask

## 2010-12-15 NOTE — Progress Notes (Addendum)
Overnight, patient remained agitated, requiring Precedex and Ativan. Has periods of apnea and hypoxia off BiPAP.  Subjective: Unable.  Objective: Vital signs in last 24 hours: Filed Vitals:   12/15/10 0600 12/15/10 0700 12/15/10 0709 12/15/10 0800  BP: 153/75 146/78 146/78 152/79  Pulse: 60 63  62  Temp:    100.6 F (38.1 C)  TempSrc:    Axillary  Resp: 26 27 25 27   Height:      Weight:      SpO2: 95% 94% 94% 94%   Weight change: 0.3 kg (10.6 oz)  Intake/Output Summary (Last 24 hours) at 12/15/10 0834 Last data filed at 12/15/10 0650  Gross per 24 hour  Intake 3940.8 ml  Output   7700 ml  Net -3759.2 ml   Physical Exam:  General: Agitated on BiPAP. Has 4 point restraints and writhing in bed. Lungs: Clear to auscultation bilaterally without wheeze rhonchi or rales Cardiovascular regular rate rhythm without murmurs gallops rubs Abdomen soft nontender nondistended Extremities trace edema. Peripheral IV in her foot.   Lab Results: Basic Metabolic Panel:  Lab 12/15/10 1610 12/14/10 0458  NA 152* 148*  K 3.5 4.1  CL 108 108  CO2 32 31  GLUCOSE 138* 100*  BUN 23 17  CREATININE 1.20* 1.07  CALCIUM 8.6 8.3*  MG -- --  PHOS -- --   Liver Function Tests:  Lab 12/15/10 0524 12/14/10 0458  AST 569* 1996*  ALT 1190* 1681*  ALKPHOS 174* 177*  BILITOT 1.2 1.1  PROT 5.4* 5.0*  ALBUMIN 2.6* 2.4*   No results found for this basename: LIPASE:2,AMYLASE:2 in the last 168 hours  Lab 12/13/10 0919  AMMONIA 76*   CBC:  Lab 12/14/10 0458 12/13/10 0630  WBC 7.4 12.4*  NEUTROABS -- 9.9*  HGB 11.7* 12.3  HCT 36.9 40.0  MCV 105.1* 105.8*  PLT 204 218   Cardiac Enzymes:  Lab 12/14/10 0759 12/13/10 2346 12/13/10 1551  CKTOTAL 475* 679* 575*  CKMB 3.8 4.2* 4.4*  CKMBINDEX -- -- --  TROPONINI <0.30 <0.30 <0.30   BNP:  Lab 12/14/10 0800  POCBNP 1528.0*   D-Dimer: No results found for this basename: DDIMER:2 in the last 168 hours CBG: No results found for this  basename: GLUCAP:6 in the last 168 hours Hemoglobin A1C: No results found for this basename: HGBA1C in the last 168 hours Fasting Lipid Panel: No results found for this basename: CHOL,HDL,LDLCALC,TRIG,CHOLHDL,LDLDIRECT in the last 960 hours Thyroid Function Tests:  Lab 12/14/10 0800 12/13/10 1206  TSH -- 0.151*  T4TOTAL -- --  FREET4 -- 0.88  T3FREE 1.6* --  THYROIDAB -- --   Coagulation:  Lab 12/15/10 0524 12/14/10 0949 12/13/10 2346 12/13/10 1206  LABPROT 18.4* 18.2* 19.9* 21.1*  INR 1.50* 1.48 1.66* 1.79*   Anemia Panel:  Lab 12/13/10 1206  VITAMINB12 >2000*  FOLATE --  FERRITIN 4434*  TIBC NOT CALC  IRON 283*  RETICCTPCT --   Urine Drug Screen: Drugs of Abuse     Component Value Date/Time   LABOPIA NONE DETECTED 12/13/2010 0655   COCAINSCRNUR NONE DETECTED 12/13/2010 0655   LABBENZ POSITIVE* 12/13/2010 0655   AMPHETMU NONE DETECTED 12/13/2010 0655   THCU NONE DETECTED 12/13/2010 0655   LABBARB NONE DETECTED 12/13/2010 0655    Alcohol Level:  Lab 12/13/10 0630  ETH <11   ANA negative. Hepatitis A IgM negative.  Micro Results: Recent Results (from the past 240 hour(s))  CULTURE, BLOOD (ROUTINE X 2)     Status: Normal (  Preliminary result)   Collection Time   12/13/10  6:45 AM      Component Value Range Status Comment   Specimen Description BLOOD RIGHT ARM   Final    Special Requests BOTTLES DRAWN AEROBIC AND ANAEROBIC 6CC   Final    Culture NO GROWTH 2 DAYS   Final    Report Status PENDING   Incomplete   URINE CULTURE     Status: Normal   Collection Time   12/13/10  6:55 AM      Component Value Range Status Comment   Specimen Description URINE, CATHETERIZED   Final    Special Requests NONE   Final    Setup Time [id number]   Final    Colony Count >=100,000 COLONIES/ML   Final    Culture ESCHERICHIA COLI   Final    Report Status 12/15/2010 FINAL   Final    Organism ID, Bacteria ESCHERICHIA COLI   Final   CULTURE, BLOOD (ROUTINE X 2)     Status:  Normal (Preliminary result)   Collection Time   12/13/10  7:15 AM      Component Value Range Status Comment   Specimen Description BLOOD RIGHT ARM   Final    Special Requests BOTTLES DRAWN AEROBIC AND ANAEROBIC 6CC   Final    Culture NO GROWTH 2 DAYS   Final    Report Status PENDING   Incomplete   MRSA PCR SCREENING     Status: Normal   Collection Time   12/13/10  1:45 PM      Component Value Range Status Comment   MRSA by PCR NEGATIVE  NEGATIVE  Final    Studies/Results: US Abdomen Complete  12/13/2010  *RADIOLOGY REPORT*  Clinical Data:  Elevated liver function tests.  Acute renal failure.  ABDOMINAL ULTRASOUND COMPLETE  Comparison:  Renal ultrasound 10/01/2005  Findings:  Gallbladder:  Not visualized, presumably due to prior cholecystectomy.  Common Bile Duct:  Within normal limits in caliber. Measures 6 mm in diameter.  Liver: Diffusely increased parenchymal echogenicity, consistent with hepatic steatosis.  No focal liver mass identified.  Trace perihepatic fluid and right pleural effusion noted.  IVC:  Appears normal.  Pancreas:  No abnormality identified.  Spleen:  Within normal limits in size and echotexture.  Right kidney:  Normal in size and parenchymal echogenicity.  No evidence of mass or hydronephrosis.  Left kidney:  Small left kidney seen measuring 9.4 cm length, as seen on prior study.  Suboptimal visualization due to body habitus and patient's inability cooperate.  No evidence of mass or hydronephrosis.  Abdominal Aorta:  No aneurysm identified.  IMPRESSION:  1.  Prior cholecystectomy.  No evidence of biliary ductal dilatation. 2.  Hepatic steatosis.  No liver mass identified. 3.  Trace perihepatic ascites and right pleural effusion. 4.  Stable small left kidney.  No evidence of hydronephrosis.  Original Report Authenticated By: Danae Orleans, M.D.   Dg Chest Port 1 View  12/14/2010  *RADIOLOGY REPORT*  Clinical Data: Respiratory failure.  PORTABLE CHEST - 1 VIEW  Comparison:  12/13/2010  Findings: The patient has increased atelectasis at both lung bases without effusions or lung consolidation.  Heart size and vascularity are normal.  IMPRESSION: Increasing bibasilar atelectasis.  Original Report Authenticated By: Gwynn Burly, M.D.   Scheduled Meds:    . cefTRIAXone (ROCEPHIN) IV  1 g Intravenous Q24H  . furosemide  40 mg Intravenous Once  . lactulose  300 mL Rectal Q6H  .  levothyroxine  50 mcg Intravenous Daily  . nicotine  21 mg Transdermal Daily  . pantoprazole (PROTONIX) IV  40 mg Intravenous Q24H  . sodium chloride      . sodium chloride      . sodium chloride      . sodium chloride      . sodium chloride      . sodium chloride      . sodium chloride      . DISCONTD: furosemide  40 mg Intramuscular Once  . DISCONTD: vancomycin  1,250 mg Intravenous Q24H  . DISCONTD: vancomycin  1,250 mg Intravenous Q12H   Continuous Infusions:    . sodium chloride    . dexmedetomidine (PRECEDEX) IV infusion for high rates 1 mcg/kg/hr (12/15/10 0826)  . DISCONTD: sodium chloride 20 mL/hr at 12/15/10 0650  . DISCONTD: dexmedetomidine (PRECEDEX) IV infusion 0.2 mcg/kg/hr (12/14/10 1137)  . DISCONTD: dexmedetomidine (PRECEDEX) IV infusion 0.7 mcg/kg/hr (12/14/10 1357)  . DISCONTD: dexmedetomidine (PRECEDEX) IV infusion     PRN Meds:.albuterol, haloperidol lactate, HYDROmorphone, LORazepam, ondansetron (ZOFRAN) IV, DISCONTD: haloperidol lactate, DISCONTD: LORazepam, DISCONTD: LORazepam, DISCONTD: ondansetron  Assessment/Plan: Principal Problem:  *Encephalopathy acute, probably multifactorial. Hypoxic hypercarbic respiratory failure, hepatic encephalopathy, rule out drug withdrawal or effect. UTI could be contributing as well. Repeat ammonia level. Continue IV Ativan and Precedex for severe agitation. Also requires restraints because she has pulling out lines and trying to get out of bed.   Acute hepatitis, transaminases improving. INR stable. Secondary to  ischemic hepatopathy. Workup for other etiology continues to be negative.   Acute respiratory failure with hypercapnia and hypoxia: Repeat chest x-ray negative. BNP was elevated and patient was given IV Lasix. I will check an echocardiogram. She has periods of apnea and certainly her encephalopathy and sedation could be contributing but will need to rule out other causes. If she has a PICC line placed today and is calm enough, will proceed with CT angiogram of the chest to rule out pulmonary embolus. The degree of hypoxia and hypercarbia seems too great to be from encephalopathy alone, and her chest x-rays failed to show any pulmonary edema despite elevated pro BNP.   HYPERTENSION, BENIGN SYSTEMIC: Medications held   Escherichia coli UTI (lower urinary tract infection): Pansensitive except for ampicillin. Continue Rocephin and stop vancomycin. Blood cultures remain negative, but patient has a picture of sepsis.  Hypernatremia: Change IV fluids to half-normal saline, though she is getting minimal fluid do to the elevated BNP.   HYPERCHOLESTEROLEMIA: Statins stopped   OBESITY, NOS   ANXIETY: On chronic benzodiazepines   SMOKER: Not wheezing currently   Ankylosing spondylitis   CKD (chronic kidney disease), stage III   Macrocytosis  Hypothyroidism: Continue current and repeat thyroid function tests as an outpatient.  Critical care time 30 minutes.   LOS: 2 days   Laverle Pillard L 12/15/2010, 8:34 AM

## 2010-12-16 LAB — COMPREHENSIVE METABOLIC PANEL
ALT: 786 U/L — ABNORMAL HIGH (ref 0–35)
AST: 153 U/L — ABNORMAL HIGH (ref 0–37)
Albumin: 2.7 g/dL — ABNORMAL LOW (ref 3.5–5.2)
CO2: 38 mEq/L — ABNORMAL HIGH (ref 19–32)
Calcium: 8.7 mg/dL (ref 8.4–10.5)
Creatinine, Ser: 1.26 mg/dL — ABNORMAL HIGH (ref 0.50–1.10)
GFR calc non Af Amer: 49 mL/min — ABNORMAL LOW (ref >90)
Sodium: 151 mEq/L — ABNORMAL HIGH (ref 135–145)

## 2010-12-16 LAB — URINALYSIS, ROUTINE W REFLEX MICROSCOPIC
Specific Gravity, Urine: 1.02 (ref 1.005–1.030)
Urobilinogen, UA: 1 mg/dL (ref 0.0–1.0)

## 2010-12-16 LAB — INFLUENZA PANEL BY PCR (TYPE A & B): H1N1 flu by pcr: NOT DETECTED

## 2010-12-16 LAB — MAGNESIUM: Magnesium: 2.1 mg/dL (ref 1.5–2.5)

## 2010-12-16 LAB — PROTIME-INR: INR: 1.4 (ref 0.00–1.49)

## 2010-12-16 LAB — URINE MICROSCOPIC-ADD ON

## 2010-12-16 MED ORDER — HYDROMORPHONE HCL PF 1 MG/ML IJ SOLN
1.0000 mg | INTRAMUSCULAR | Status: DC
  Administered 2010-12-16 (×2): 1 mg via INTRAVENOUS
  Filled 2010-12-16 (×2): qty 1

## 2010-12-16 MED ORDER — FUROSEMIDE 10 MG/ML IJ SOLN
20.0000 mg | Freq: Once | INTRAMUSCULAR | Status: AC
  Administered 2010-12-16: 20 mg via INTRAVENOUS
  Filled 2010-12-16: qty 2

## 2010-12-16 MED ORDER — HYDROMORPHONE HCL PF 1 MG/ML IJ SOLN
2.0000 mg | INTRAMUSCULAR | Status: DC
  Administered 2010-12-16 – 2010-12-17 (×3): 2 mg via INTRAVENOUS
  Administered 2010-12-17 (×2): 1 mg via INTRAVENOUS
  Filled 2010-12-16 (×4): qty 1
  Filled 2010-12-16: qty 2
  Filled 2010-12-16: qty 1

## 2010-12-16 MED ORDER — LORAZEPAM 2 MG/ML IJ SOLN
1.0000 mg | INTRAMUSCULAR | Status: DC
  Administered 2010-12-16 – 2010-12-17 (×6): 1 mg via INTRAVENOUS
  Filled 2010-12-16 (×3): qty 1

## 2010-12-16 MED ORDER — ACETAMINOPHEN 650 MG RE SUPP
650.0000 mg | RECTAL | Status: DC | PRN
  Administered 2010-12-16 – 2010-12-18 (×6): 650 mg via RECTAL
  Filled 2010-12-16 (×4): qty 1

## 2010-12-16 MED ORDER — VANCOMYCIN HCL 1000 MG IV SOLR
750.0000 mg | Freq: Two times a day (BID) | INTRAVENOUS | Status: DC
  Administered 2010-12-16 – 2010-12-21 (×8): 750 mg via INTRAVENOUS
  Filled 2010-12-16 (×10): qty 750

## 2010-12-16 MED ORDER — POTASSIUM CHLORIDE 10 MEQ/100ML IV SOLN
INTRAVENOUS | Status: AC
  Filled 2010-12-16: qty 100

## 2010-12-16 MED ORDER — POTASSIUM CHLORIDE 10 MEQ/100ML IV SOLN
10.0000 meq | INTRAVENOUS | Status: AC
  Administered 2010-12-16 (×4): 10 meq via INTRAVENOUS
  Filled 2010-12-16 (×3): qty 100

## 2010-12-16 NOTE — Progress Notes (Signed)
Subjective:  Remains agitated/restrained and non-communicative.  Progress over the past 24 hours noted.  Precedex discontinued. Ativan and dilaudid being utilized for sedation currently  Objective: Vital signs in last 24 hours: Temp:  [98.5 F (36.9 C)-100.2 F (37.9 C)] 100.2 F (37.9 C) (11/25 1200) Pulse Rate:  [25-95] 95  (11/25 1439) Resp:  [11-28] 16  (11/25 1439) BP: (128-158)/(63-141) 158/141 mmHg (11/25 1200) SpO2:  [91 %-100 %] 94 % (11/25 1439) FiO2 (%):  [100 %] 100 % (11/24 2000) Last BM Date: 12/15/10   Abdomen:  Soft No mass or obvious organomegaly Extremities:  Without clubbing or edema.    Intake/Output from previous day: 11/24 0701 - 11/25 0700 In: 3159.9 [I.V.:759.9; IV Piggyback:400] Out: 4905 [Urine:2805; Stool:2100] Intake/Output this shift: Total I/O In: 332.6 [I.V.:82.6; IV Piggyback:250] Out: 350 [Urine:350]  Lab Results:  [facility] 12/14/10 0458  WBC 7.4  HGB 11.7*  HCT 36.9  PLT 204   BMET  Basename 12/16/10 0447 12/15/10 0524 12/14/10 0458  NA 151* 152* 148*  K 2.9* 3.5 4.1  CL 106 108 108  CO2 38* 32 31  GLUCOSE 146* 138* 100*  BUN 25* 23 17  CREATININE 1.26* 1.20* 1.07  CALCIUM 8.7 8.6 8.3*   LFT  Basename 12/16/10 0447 12/14/10 0458  PROT 5.6* --  ALBUMIN 2.7* --  AST 153* --  ALT 786* --  ALKPHOS 146* --  BILITOT 1.2 --  BILIDIR 0.5* --  IBILI -- 0.6   PT/INR  Basename 12/16/10 0447 12/15/10 0524  LABPROT 17.4* 18.4*  INR 1.40 1.50*   Hepatitis Panel No results found for this basename: HEPBSAG,HCVAB,HEPAIGM,HEPBIGM in the last 72 hours C-Diff No results found for this basename: CDIFFTOX:3 in the last 72 hours  Studies/Results: Ct Angio Chest W/cm &/or Wo Cm  12/15/2010  *RADIOLOGY REPORT*  Clinical Data:  Unexplained respiratory failure.  CT ANGIOGRAPHY CHEST WITH CONTRAST  Technique:  Multidetector CT imaging of the chest was performed using the standard protocol during bolus administration of intravenous  contrast.  Multiplanar CT image reconstructions including MIPs were obtained to evaluate the vascular anatomy.  Contrast: OMNIPAQUE IOHEXOL 300 MG/ML IV SOLN  Comparison:  No comparison chest CT.  Findings:  No pulmonary embolus identified.  Consolidation lower lobes (greater on the right) and posterior aspect the right upper lobe with patchy consolidation remainder of right upper lobe.  The bronchus extending into the right lower lobe is narrowed.  This will require follow-up to exclude surrounding mass.  Mucous plug not excluded.  Mediastinal adenopathy most notable subcarinal position.  Cardiomegaly.  Limit evaluation of the aorta given the phase of contrast enhancement without gross abnormality identified.  Fatty infiltration of liver.  No free air in the upper abdomen noted.  PICC line tip proximal superior vena cava.  Degenerative changes thoracic spine.  Lower cervical spine with poor definition of endplates which may represent degenerative changes.  Infection not excluded in the proper clinical setting.  Review of the MIP images confirms the above findings.  IMPRESSION: No pulmonary embolus noted.  Bilateral lower lobe and right upper lobe areas of consolidation. The bronchus supplying the right lower lobe is narrowed.  Please see above discussion.  Adenopathy most notable subcarinal position.  Cardiomegaly.  Lower cervical spine with poor definition of endplates which may represent degenerative changes.  Infection not excluded in the proper clinical setting.  This will be called to the floor by [facility] radiology technologist  Original Report Authenticated By: Fuller Canada, M.D.  Dg Chest Port 1 View  12/15/2010  *RADIOLOGY REPORT*  Clinical Data: PICC line placement.  PORTABLE CHEST - 1 VIEW  Comparison: 12/14/2010.  Findings: Right PICC line tip proximal superior vena cava level.  Interval development of consolidation lung bases greater on the right.  This may represent pleural fluid, atelectasis  and / or infiltrate.  Pulmonary vascular congestion/pulmonary edema.  No gross pneumothorax.  Left lateral lung not included present exam.  Mild cardiomegaly.  IMPRESSION: Right PICC line tip proximal superior vena cava level.  Interval development of consolidation lung bases greater on the right.  This may represent pleural fluid, atelectasis and / or infiltrate.  Pulmonary vascular congestion/pulmonary edema.  Left lateral lung not included present exam.  Original Report Authenticated By: Fuller Canada, M.D.    Assessment:  Shock liver resolving. INR remains normal which is reassuring.   Encephalopathy-most likely secondary to drug withdrawal to a great extent.  Neurology workup per Dr. Lendell Caprice as she deems appropriate  Recommendations:   No further specific GI recommendations at this time. We'll continue to follow peripherally.   Plan:   LOS: 3 days   Eula Listen  12/16/2010, 2:54 PM

## 2010-12-16 NOTE — Progress Notes (Signed)
ANTIBIOTIC CONSULT NOTE - INITIAL  Pharmacy Consult for Vancomycin Indication: pneumonia  Allergies  Allergen Reactions  . Codeine     REACTION: N/V  . Latex     Patient Measurements: Height: 5\' 2"  (157.5 cm) Weight: 216 lb 4.3 oz (98.1 kg) IBW/kg (Calculated) : 50.1   Vital Signs: Temp: 100.2 F (37.9 C) (11/25 1200) Temp src: Axillary (11/25 1200) BP: 158/141 mmHg (11/25 1200) Pulse Rate: 95  (11/25 1439) Intake/Output from previous day: 11/24 0701 - 11/25 0700 In: 3159.9 [I.V.:759.9; IV Piggyback:400] Out: 4905 [Urine:2805; Stool:2100] Intake/Output from this shift: Total I/O In: 632.6 [I.V.:82.6; IV Piggyback:550] Out: 700 [Urine:700]  Labs:  [facility] 12/16/10 0447 12/15/10 0524 12/14/10 0458  WBC -- -- 7.4  HGB -- -- 11.7*  PLT -- -- 204  LABCREA -- -- --  CREATININE 1.26* 1.20* 1.07   Estimated Creatinine Clearance: 58.4 ml/min (by C-G formula based on Cr of 1.26). No results found for this basename: VANCOTROUGH:2,VANCOPEAK:2,VANCORANDOM:2,GENTTROUGH:2,GENTPEAK:2,GENTRANDOM:2,TOBRATROUGH:2,TOBRAPEAK:2,TOBRARND:2,AMIKACINPEAK:2,AMIKACINTROU:2,AMIKACIN:2, in the last 72 hours   Microbiology: Recent Results (from the past 720 hour(s))  CULTURE, BLOOD (ROUTINE X 2)     Status: Normal (Preliminary result)   Collection Time   12/13/10  6:45 AM      Component Value Range Status Comment   Specimen Description BLOOD RIGHT ARM   Final    Special Requests BOTTLES DRAWN AEROBIC AND ANAEROBIC 6CC   Final    Culture NO GROWTH 3 DAYS   Final    Report Status PENDING   Incomplete   URINE CULTURE     Status: Normal   Collection Time   12/13/10  6:55 AM      Component Value Range Status Comment   Specimen Description URINE, CATHETERIZED   Final    Special Requests NONE   Final    Setup Time [id number]   Final    Colony Count >=100,000 COLONIES/ML   Final    Culture ESCHERICHIA COLI   Final    Report Status 12/15/2010 FINAL   Final    Organism ID, Bacteria  ESCHERICHIA COLI   Final   CULTURE, BLOOD (ROUTINE X 2)     Status: Normal (Preliminary result)   Collection Time   12/13/10  7:15 AM      Component Value Range Status Comment   Specimen Description BLOOD RIGHT ARM   Final    Special Requests BOTTLES DRAWN AEROBIC AND ANAEROBIC 6CC   Final    Culture NO GROWTH 3 DAYS   Final    Report Status PENDING   Incomplete   MRSA PCR SCREENING     Status: Normal   Collection Time   12/13/10  1:45 PM      Component Value Range Status Comment   MRSA by PCR NEGATIVE  NEGATIVE  Final     Medical History: Past Medical History  Diagnosis Date  . Allergy   . Arthritis   . Depression   . GERD (gastroesophageal reflux disease)   . Hyperlipidemia   . Hypertension   . CKD (chronic kidney disease) stage 3, GFR 30-59 ml/min   . Osteoporosis   . Hypothyroidism   . Anxiety   . Chronic pain syndrome   . Tobacco abuse   . Obesity   . Gastroparesis   . Ankylosing spondylitis   . Nephrolithiasis     Medications:  Scheduled:    . antiseptic oral rinse  15 mL Mouth Rinse Q4H  . azithromycin  500 mg Intravenous Q24H  . furosemide  20 mg Intravenous Once  .  HYDROmorphone (DILAUDID) injection  2 mg Intravenous Q4H  . levothyroxine  50 mcg Intravenous Daily  . LORazepam  1 mg Intravenous Q4H  . nicotine  21 mg Transdermal Daily  . pantoprazole (PROTONIX) IV  40 mg Intravenous Q24H  . piperacillin-tazobactam (ZOSYN)  IV  3.375 g Intravenous Q8H  . potassium chloride  10 mEq Intravenous Q1 Hr x 4  . sodium chloride  10 mL Intracatheter Q12H  . sodium chloride      . sodium chloride      . sodium chloride      . sodium chloride      . sodium chloride      . vancomycin  750 mg Intravenous Q12H  . DISCONTD:  HYDROmorphone (DILAUDID) injection  1 mg Intravenous Q4H  . DISCONTD: lactulose  300 mL Rectal Q6H  . DISCONTD: potassium chloride       Assessment: Ok for protocol   Goal of Therapy:  Vancomycin trough level 15-20 mcg/ml  Plan:    Vancomycin 1 Gm IV every 12 hours Monitor renal function Measure antibiotic drug levels at steady state  [address], Jessicia Napolitano [place] 12/16/2010,4:51 PM

## 2010-12-16 NOTE — Progress Notes (Signed)
Pt taken off bipap for now placed on nrb

## 2010-12-16 NOTE — Progress Notes (Signed)
Over the past 24 hours, patient had a PICC line placed, chest x-ray showed bilateral pneumonia. She also had a CT angiogram of the chest which confirmed bilateral pneumonia but ruled out pulmonary embolus. Her antibiotics were changed to azithromycin and Zosyn. Her repeat ammonia level was normal, so her lactulose retention enemas were stopped. She remains on a Precedex drip and intermittent Ativan with continued delirium and agitation. I spoke again with the patient's husband, who reported that the patient ran out of her medications last week then had them refilled at some point. He reports that she has had periods of agitation at home when she runs out of her opiates, but she has never run out of her Xanax. He reports no history of illicit drug use, suicidal ideation, or alcohol use. He reports that it really all started after her dose of Celexa was increased.  Subjective: Unable.  Objective: Vital signs in last 24 hours: Filed Vitals:   12/16/10 0423 12/16/10 0500 12/16/10 0511 12/16/10 0600  BP:  136/76 137/76   Pulse:  54 56 72  Temp: 99.8 F (37.7 C)     TempSrc: Axillary     Resp:  21 21 27   Height:      Weight:      SpO2:  93% 93% 96%   Weight change:   Intake/Output Summary (Last 24 hours) at 12/16/10 0836 Last data filed at 12/16/10 0807  Gross per 24 hour  Intake 3256.5 ml  Output   5255 ml  Net -1998.5 ml   Physical Exam:  General: Agitated.  Lungs: Clear to auscultation bilaterally without wheeze rhonchi or rales Cardiovascular regular rate rhythm without murmurs gallops rubs Abdomen soft nontender nondistended Extremities trace edema. Right hand edematous from restraints Neurologic. Eyes closed, won't answer questions or follow commands. Moving all 4 extremities  Lab Results: Basic Metabolic Panel:  Lab 12/16/10 9562 12/15/10 0524  NA 151* 152*  K 2.9* 3.5  CL 106 108  CO2 38* 32  GLUCOSE 146* 138*  BUN 25* 23  CREATININE 1.26* 1.20*  CALCIUM 8.7 8.6  MG  -- --  PHOS -- --   Liver Function Tests:  Lab 12/16/10 0447 12/15/10 0524  AST 153* 569*  ALT 786* 1190*  ALKPHOS 146* 174*  BILITOT 1.2 1.2  PROT 5.6* 5.4*  ALBUMIN 2.7* 2.6*   No results found for this basename: LIPASE:2,AMYLASE:2 in the last 168 hours  Lab 12/15/10 1610 12/13/10 0919  AMMONIA 32 76*   CBC:  Lab 12/14/10 0458 12/13/10 0630  WBC 7.4 12.4*  NEUTROABS -- 9.9*  HGB 11.7* 12.3  HCT 36.9 40.0  MCV 105.1* 105.8*  PLT 204 218   Cardiac Enzymes:  Lab 12/14/10 0759 12/13/10 2346 12/13/10 1551  CKTOTAL 475* 679* 575*  CKMB 3.8 4.2* 4.4*  CKMBINDEX -- -- --  TROPONINI <0.30 <0.30 <0.30   BNP:  Lab 12/14/10 0800  POCBNP 1528.0*   D-Dimer: No results found for this basename: DDIMER:2 in the last 168 hours CBG: No results found for this basename: GLUCAP:6 in the last 168 hours Hemoglobin A1C: No results found for this basename: HGBA1C in the last 168 hours Fasting Lipid Panel: No results found for this basename: CHOL,HDL,LDLCALC,TRIG,CHOLHDL,LDLDIRECT in the last 130 hours Thyroid Function Tests:  Lab 12/14/10 0800 12/13/10 1206  TSH -- 0.151*  T4TOTAL -- --  FREET4 -- 0.88  T3FREE 1.6* --  THYROIDAB -- --   Coagulation:  Lab 12/16/10 0447 12/15/10 0524 12/14/10 0949 12/13/10 2346  LABPROT 17.4* 18.4* 18.2* 19.9*  INR 1.40 1.50* 1.48 1.66*   Anemia Panel:  Lab 12/13/10 1206  VITAMINB12 >2000*  FOLATE --  FERRITIN 4434*  TIBC NOT CALC  IRON 283*  RETICCTPCT --   Urine Drug Screen: Drugs of Abuse     Component Value Date/Time   LABOPIA NONE DETECTED 12/13/2010 0655   COCAINSCRNUR NONE DETECTED 12/13/2010 0655   LABBENZ POSITIVE* 12/13/2010 0655   AMPHETMU NONE DETECTED 12/13/2010 0655   THCU NONE DETECTED 12/13/2010 0655   LABBARB NONE DETECTED 12/13/2010 0655    Alcohol Level:  Lab 12/13/10 0630  ETH <11   ANA negative. Hepatitis A IgM negative.  Micro Results: Recent Results (from the past 240 hour(s))  CULTURE, BLOOD  (ROUTINE X 2)     Status: Normal (Preliminary result)   Collection Time   12/13/10  6:45 AM      Component Value Range Status Comment   Specimen Description BLOOD RIGHT ARM   Final    Special Requests BOTTLES DRAWN AEROBIC AND ANAEROBIC 6CC   Final    Culture NO GROWTH 3 DAYS   Final    Report Status PENDING   Incomplete   URINE CULTURE     Status: Normal   Collection Time   12/13/10  6:55 AM      Component Value Range Status Comment   Specimen Description URINE, CATHETERIZED   Final    Special Requests NONE   Final    Setup Time [id number]   Final    Colony Count >=100,000 COLONIES/ML   Final    Culture ESCHERICHIA COLI   Final    Report Status 12/15/2010 FINAL   Final    Organism ID, Bacteria ESCHERICHIA COLI   Final   CULTURE, BLOOD (ROUTINE X 2)     Status: Normal (Preliminary result)   Collection Time   12/13/10  7:15 AM      Component Value Range Status Comment   Specimen Description BLOOD RIGHT ARM   Final    Special Requests BOTTLES DRAWN AEROBIC AND ANAEROBIC 6CC   Final    Culture NO GROWTH 3 DAYS   Final    Report Status PENDING   Incomplete   MRSA PCR SCREENING     Status: Normal   Collection Time   12/13/10  1:45 PM      Component Value Range Status Comment   MRSA by PCR NEGATIVE  NEGATIVE  Final    Studies/Results: Ct Angio Chest W/cm &/or Wo Cm  12/15/2010  *RADIOLOGY REPORT*  Clinical Data:  Unexplained respiratory failure.  CT ANGIOGRAPHY CHEST WITH CONTRAST  Technique:  Multidetector CT imaging of the chest was performed using the standard protocol during bolus administration of intravenous contrast.  Multiplanar CT image reconstructions including MIPs were obtained to evaluate the vascular anatomy.  Contrast: OMNIPAQUE IOHEXOL 300 MG/ML IV SOLN  Comparison:  No comparison chest CT.  Findings:  No pulmonary embolus identified.  Consolidation lower lobes (greater on the right) and posterior aspect the right upper lobe with patchy consolidation remainder of  right upper lobe.  The bronchus extending into the right lower lobe is narrowed.  This will require follow-up to exclude surrounding mass.  Mucous plug not excluded.  Mediastinal adenopathy most notable subcarinal position.  Cardiomegaly.  Limit evaluation of the aorta given the phase of contrast enhancement without gross abnormality identified.  Fatty infiltration of liver.  No free air in the upper abdomen noted.  PICC line tip proximal  superior vena cava.  Degenerative changes thoracic spine.  Lower cervical spine with poor definition of endplates which may represent degenerative changes.  Infection not excluded in the proper clinical setting.  Review of the MIP images confirms the above findings.  IMPRESSION: No pulmonary embolus noted.  Bilateral lower lobe and right upper lobe areas of consolidation. The bronchus supplying the right lower lobe is narrowed.  Please see above discussion.  Adenopathy most notable subcarinal position.  Cardiomegaly.  Lower cervical spine with poor definition of endplates which may represent degenerative changes.  Infection not excluded in the proper clinical setting.  This will be called to the floor by [facility] radiology technologist  Original Report Authenticated By: Fuller Canada, M.D.   Dg Chest Port 1 View  12/15/2010  *RADIOLOGY REPORT*  Clinical Data: PICC line placement.  PORTABLE CHEST - 1 VIEW  Comparison: 12/14/2010.  Findings: Right PICC line tip proximal superior vena cava level.  Interval development of consolidation lung bases greater on the right.  This may represent pleural fluid, atelectasis and / or infiltrate.  Pulmonary vascular congestion/pulmonary edema.  No gross pneumothorax.  Left lateral lung not included present exam.  Mild cardiomegaly.  IMPRESSION: Right PICC line tip proximal superior vena cava level.  Interval development of consolidation lung bases greater on the right.  This may represent pleural fluid, atelectasis and / or infiltrate.  Pulmonary  vascular congestion/pulmonary edema.  Left lateral lung not included present exam.  Original Report Authenticated By: Fuller Canada, M.D.   Dg Chest Port 1 View  12/14/2010  *RADIOLOGY REPORT*  Clinical Data: Respiratory failure.  PORTABLE CHEST - 1 VIEW  Comparison: 12/13/2010  Findings: The patient has increased atelectasis at both lung bases without effusions or lung consolidation.  Heart size and vascularity are normal.  IMPRESSION: Increasing bibasilar atelectasis.  Original Report Authenticated By: Gwynn Burly, M.D.   Scheduled Meds:    . antiseptic oral rinse  15 mL Mouth Rinse Q4H  . azithromycin  500 mg Intravenous Q24H  . furosemide  20 mg Intravenous Once  .  HYDROmorphone (DILAUDID) injection  1 mg Intravenous Q4H  . levothyroxine  50 mcg Intravenous Daily  . LORazepam  1 mg Intravenous Q4H  . LORazepam  2 mg Intravenous Once  . nicotine  21 mg Transdermal Daily  . pantoprazole (PROTONIX) IV  40 mg Intravenous Q24H  . piperacillin-tazobactam (ZOSYN)  IV  3.375 g Intravenous Q8H  . potassium chloride  10 mEq Intravenous Q1 Hr x 4  . sodium chloride  10 mL Intracatheter Q12H  . sodium chloride      . sodium chloride      . sodium chloride      . sodium chloride      . sodium chloride      . ziprasidone  20 mg Intramuscular Once  . DISCONTD: cefTRIAXone (ROCEPHIN) IV  1 g Intravenous Q24H  . DISCONTD: lactulose  300 mL Rectal Q6H  . DISCONTD: potassium chloride       Continuous Infusions:    . sodium chloride 10 mL/hr at 12/15/10 2300  . dexmedetomidine (PRECEDEX) IV infusion for high rates 0.9 mcg/kg/hr (12/16/10 0807)   PRN Meds:.albuterol, iohexol, LORazepam, ondansetron (ZOFRAN) IV, sodium chloride, DISCONTD: haloperidol lactate, DISCONTD: HYDROmorphone  Assessment/Plan:   *Encephalopathy continues. I suspect this is multifactorial. Suspect withdrawal from benzodiazepines, opiate analgesics. Also from toxic metabolic encephalopathy. Doubt significant  contribution of increase Celexa dose at this point, she because she is too  far out from last dose. Of concern, is prolonged hypoxia prior to admission. Ammonia levels are now normal, so this should is not contributing at this point. Rule out subclinical seizures. I will order an EEG. Wean off Precedex, and instead give scheduled doses of Ativan and Dilaudid in addition to when necessary doses. Hopefully, she will clear soon. If not, she will need an alternate means of nutrition, and neurology consult. Continues to require restraints because she has pulling out lines and trying to get out of bed. She is too agitated to undergo MRI of the brain.  Bilateral pneumonia with narrowing of right lower lobe bronchus: See above. Will need a repeat CAT scan in 3-6 months after resolution of her acute illness.  ischemic hepatopathy: Liver enzymes INR nearly back to normal. Ammonia level normal. No further workup needed.   Acute respiratory failure with hypercapnia and hypoxia: Patient has evidence of bilateral pneumonia. I suspect she had a pulmonary infection prior to admission that did not show up on chest x-rays and that this may have been community-acquired or aspiration related. Continue Zosyn and azithromycin. Check urine for Legionella antigen, pneumococcus antigen. PCR for influenza. Also has most likely sleep apnea which is previously been undiagnosed as far as I can tell. No pulmonary edema seen on CAT scan, but will proceed with echocardiogram to rule out an element of heart failure.    HYPERTENSION, BENIGN SYSTEMIC: Medications held   Escherichia coli UTI (lower urinary tract infection): Pansensitive except for ampicillin. Zosyn will cover  Hypernatremia: Increase half-normal saline to 75 cc an hour  Hypokalemia: Replete IV and check magnesium level   HYPERCHOLESTEROLEMIA: Statins stopped   OBESITY, NOS   ANXIETY: On chronic benzodiazepines   SMOKER: Not wheezing currently   Ankylosing  spondylitis   CKD (chronic kidney disease), stage III   Macrocytosis  Hypothyroidism: Continue current and repeat thyroid function tests as an outpatient.  Critical care time 30 minutes.   LOS: 3 days   [name] 12/16/2010, 8:36 AM

## 2010-12-17 ENCOUNTER — Inpatient Hospital Stay (HOSPITAL_COMMUNITY)
Admit: 2010-12-17 | Discharge: 2010-12-17 | Disposition: A | Discharge status: Home / Self Care | Payer: BC Managed Care – PPO | Attending: Internal Medicine | Admitting: Internal Medicine

## 2010-12-17 DIAGNOSIS — G9340 Encephalopathy, unspecified: Secondary | ICD-10-CM

## 2010-12-17 DIAGNOSIS — K7200 Acute and subacute hepatic failure without coma: Secondary | ICD-10-CM

## 2010-12-17 DIAGNOSIS — J984 Other disorders of lung: Secondary | ICD-10-CM

## 2010-12-17 LAB — COMPREHENSIVE METABOLIC PANEL
AST: 65 U/L — ABNORMAL HIGH (ref 0–37)
Albumin: 2.8 g/dL — ABNORMAL LOW (ref 3.5–5.2)
Calcium: 8.3 mg/dL — ABNORMAL LOW (ref 8.4–10.5)
Chloride: 109 mEq/L (ref 96–112)
Creatinine, Ser: 1.36 mg/dL — ABNORMAL HIGH (ref 0.50–1.10)

## 2010-12-17 LAB — CBC
HCT: 37 % (ref 36.0–46.0)
Hemoglobin: 11.3 g/dL — ABNORMAL LOW (ref 12.0–15.0)
RBC: 3.46 MIL/uL — ABNORMAL LOW (ref 3.87–5.11)
WBC: 12.5 10*3/uL — ABNORMAL HIGH (ref 4.0–10.5)

## 2010-12-17 LAB — LEGIONELLA ANTIGEN, URINE: Legionella Antigen, Urine: NEGATIVE

## 2010-12-17 LAB — PROTIME-INR: INR: 1.46 (ref 0.00–1.49)

## 2010-12-17 MED ORDER — POTASSIUM CL IN DEXTROSE 5% 20 MEQ/L IV SOLN
20.0000 meq | INTRAVENOUS | Status: DC
  Administered 2010-12-17 – 2010-12-18 (×2): 20 meq via INTRAVENOUS

## 2010-12-17 MED ORDER — ACETAMINOPHEN 650 MG RE SUPP
RECTAL | Status: AC
  Filled 2010-12-17: qty 1

## 2010-12-17 MED ORDER — HALOPERIDOL LACTATE 5 MG/ML IJ SOLN
2.0000 mg | Freq: Three times a day (TID) | INTRAMUSCULAR | Status: DC
  Administered 2010-12-17 – 2010-12-24 (×20): 2 mg via INTRAVENOUS
  Filled 2010-12-17 (×21): qty 1

## 2010-12-17 MED ORDER — LORAZEPAM 2 MG/ML IJ SOLN
INTRAMUSCULAR | Status: AC
  Filled 2010-12-17: qty 1

## 2010-12-17 MED ORDER — LORAZEPAM 2 MG/ML IJ SOLN
2.0000 mg | INTRAMUSCULAR | Status: DC
  Administered 2010-12-17 – 2010-12-19 (×10): 2 mg via INTRAVENOUS
  Filled 2010-12-17 (×8): qty 1

## 2010-12-17 MED ORDER — HALOPERIDOL LACTATE 5 MG/ML IJ SOLN
INTRAMUSCULAR | Status: AC
  Administered 2010-12-17: 2 mg via INTRAVENOUS
  Filled 2010-12-17: qty 1

## 2010-12-17 MED ORDER — POTASSIUM CHLORIDE 10 MEQ/100ML IV SOLN
INTRAVENOUS | Status: AC
  Administered 2010-12-17: 11:00:00
  Filled 2010-12-17: qty 100

## 2010-12-17 MED ORDER — POTASSIUM CHLORIDE 10 MEQ/100ML IV SOLN
10.0000 meq | INTRAVENOUS | Status: AC
  Administered 2010-12-17 (×2): 10 meq via INTRAVENOUS
  Filled 2010-12-17: qty 100

## 2010-12-17 NOTE — Progress Notes (Signed)
0750 Pt opens eyes to verbal stimuli, but does not follow directions. She is extremely agitated, kicking legs and flailing arms. Wrist restraints assessed and retied. Pt has extensive bruising, especially on hands from movement. Strong radial pulses, skin warm and dry. Ativan 2mg  iv given.

## 2010-12-17 NOTE — Progress Notes (Signed)
Pt placed on BiPAP about 0210 again respirations dropped in to 5/6 range -SATs 86.  Pt is being almost totally ventilated by machine only triggering machine 4 % of time.Marland Kitchen SATs are 91 at this time, mask hard to seal.

## 2010-12-17 NOTE — Progress Notes (Signed)
*  PRELIMINARY RESULTS* Echocardiogram 2D Echocardiogram has been performed.  [name] 12/17/2010, 10:48 AM

## 2010-12-17 NOTE — Telephone Encounter (Signed)
Unable to reach pt.  Phone went straight to VM.  LMOVM informing pt of the date and time [employer] [employer]

## 2010-12-17 NOTE — Progress Notes (Addendum)
Subjective: Patient remains unresponsive. Thrashing in bed.  In restraints.  Objective: Vital signs in last 24 hours: Temp:  [98.4 F (36.9 C)-102.3 F (39.1 C)] 99.1 F (37.3 C) (11/26 0658) Pulse Rate:  [49-119] 95  (11/26 0600) Resp:  [8-28] 9  (11/26 0600) BP: (91-158)/(50-141) 123/65 mmHg (11/26 0600) SpO2:  [85 %-99 %] 89 % (11/26 0600) FiO2 (%):  [0.5 %-100 %] 100 % (11/26 0400) Weight:  [211 lb 13.8 oz (96.1 kg)] 211 lb 13.8 oz (96.1 kg) (11/26 0637) Last BM Date: 12/15/10 General:   [place] Setting. In restraints. Thrashing in bed. Eyes:  Sclera clear, no icterus.   Conjunctiva pink. Mouth:  No deformity or lesions, oropharynx pink & moist. Heart:  Tachycardia. no murmurs, clicks, rubs,  or gallops. Abdomen:  Soft, nontender and nondistended. No masses, hepatosplenomegaly or hernias noted. Normal bowel sounds, without guarding, and without rebound.   Pulses:  Normal pulses noted. Extremities:  Without edema. Neurologic:  Awake, but does not respond to questions. Skin:  Intact.  No rashes. + Ecchymosis to all extremities Psych:  Confused. Intake/Output from previous day: 11/25 0701 - 11/26 0700 In: 2867.6 [I.V.:1767.6; IV Piggyback:1100] Out: 1185 [Urine:1185]  Lab Results:  [facility] 12/17/10 0501  WBC 12.5*  HGB 11.3*  HCT 37.0  PLT 158   BMET  Basename 12/17/10 0501 12/16/10 0447 12/15/10 0524  NA 154* 151* 152*  K 3.4* 2.9* 3.5  CL 109 106 108  CO2 40* 38* 32  GLUCOSE 110* 146* 138*  BUN 23 25* 23  CREATININE 1.36* 1.26* 1.20*  CALCIUM 8.3* 8.7 8.6   LFT  Basename 12/17/10 0501 12/16/10 0447 12/15/10 0524  PROT 5.4* 5.6* 5.4*  ALBUMIN 2.8* 2.7* 2.6*  AST 65* 153* 569*  ALT 518* 786* 1190*  ALKPHOS 118* 146* 174*  BILITOT 1.4* 1.2 1.2  BILIDIR -- 0.5* 0.5*  IBILI -- -- --  LIPASE -- -- --  AMYLASE -- -- --   PT/INR  Basename 12/17/10 0501 12/16/10 0447  LABPROT 18.0* 17.4*  INR 1.46 1.40   Alpha I antitrypsin negative, hepatitis A, B,C  negative, ANA negative, iron 283, ferritin 4434, B12 greater than 2000, AMA pending Assessment: 1. Shock liver:  LFTS improving. INR remains normal. 2. Encephalopathy: Symptoms of drug withdrawal. Questionable contributing factors.   Plan: 1. Agree with neurology consult 2. Will follow peripherally 3. Follow-up LFTs Wed (pt already has daily CMP ordered)  LOS: 4 days   Lorenza Burton  12/17/2010, 8:13 AM

## 2010-12-17 NOTE — Progress Notes (Signed)
Over the past 24 hours, patient spiked a fever to above 102. Vancomycin was resumed and she was recultured. She remains agitated and confused. Keeps pulling off oxygen mask despite being in restraints.  Subjective: Unable.  Objective: Vital signs in last 24 hours: Filed Vitals:   12/17/10 0515 12/17/10 0600 12/17/10 0637 12/17/10 0658  BP:  123/65    Pulse: 119 95    Temp:    99.1 F (37.3 C)  TempSrc:      Resp: 21 9    Height:      Weight:   96.1 kg (211 lb 13.8 oz)   SpO2: 93% 89%     Weight change:   Intake/Output Summary (Last 24 hours) at 12/17/10 0907 Last data filed at 12/17/10 0600  Gross per 24 hour  Intake 2559.63 ml  Output    835 ml  Net 1724.63 ml   Physical Exam:  General: Agitated. Eyes closed. Oxygen mask off and currently cyanotic.  Lungs: Rhonchi throughout Cardiovascular regular rate rhythm without murmurs gallops rubs Abdomen soft nontender nondistended Extremities trace edema. Right hand edematous from restraints Neurologic. Eyes closed, won't answer questions or follow commands. Moving all 4 extremities  Lab Results: Basic Metabolic Panel:  Lab 12/17/10 8657 12/16/10 0447  NA 154* 151*  K 3.4* 2.9*  CL 109 106  CO2 40* 38*  GLUCOSE 110* 146*  BUN 23 25*  CREATININE 1.36* 1.26*  CALCIUM 8.3* 8.7  MG -- 2.1  PHOS -- --   Liver Function Tests:  Lab 12/17/10 0501 12/16/10 0447  AST 65* 153*  ALT 518* 786*  ALKPHOS 118* 146*  BILITOT 1.4* 1.2  PROT 5.4* 5.6*  ALBUMIN 2.8* 2.7*   No results found for this basename: LIPASE:2,AMYLASE:2 in the last 168 hours  Lab 12/15/10 1610 12/13/10 0919  AMMONIA 32 76*   CBC:  Lab 12/17/10 0501 12/14/10 0458 12/13/10 0630  WBC 12.5* 7.4 --  NEUTROABS -- -- 9.9*  HGB 11.3* 11.7* --  HCT 37.0 36.9 --  MCV 106.9* 105.1* --  PLT 158 204 --   Cardiac Enzymes:  Lab 12/14/10 0759 12/13/10 2346 12/13/10 1551  CKTOTAL 475* 679* 575*  CKMB 3.8 4.2* 4.4*  CKMBINDEX -- -- --  TROPONINI <0.30  <0.30 <0.30   BNP:  Lab 12/14/10 0800  POCBNP 1528.0*   D-Dimer: No results found for this basename: DDIMER:2 in the last 168 hours CBG: No results found for this basename: GLUCAP:6 in the last 168 hours Hemoglobin A1C: No results found for this basename: HGBA1C in the last 168 hours Fasting Lipid Panel: No results found for this basename: CHOL,HDL,LDLCALC,TRIG,CHOLHDL,LDLDIRECT in the last 846 hours Thyroid Function Tests:  Lab 12/14/10 0800 12/13/10 1206  TSH -- 0.151*  T4TOTAL -- --  FREET4 -- 0.88  T3FREE 1.6* --  THYROIDAB -- --   Coagulation:  Lab 12/17/10 0501 12/16/10 0447 12/15/10 0524 12/14/10 0949  LABPROT 18.0* 17.4* 18.4* 18.2*  INR 1.46 1.40 1.50* 1.48   Anemia Panel:  Lab 12/13/10 1206  VITAMINB12 >2000*  FOLATE --  FERRITIN 4434*  TIBC NOT CALC  IRON 283*  RETICCTPCT --   Urine strep pneumo antigen negative  Influenza PCR negative  Urine Legionella pending  Repeat urinalysis Results for AMIRA, PODOLAK (MRN [id number]) as of 12/17/2010 09:11  Ref. Range 12/16/2010 18:37  Color, Urine Latest Range: YELLOW  YELLOW  Appearance Latest Range: CLEAR  CLEAR  Specific Gravity, Urine Latest Range: 1.005-1.030  1.020  pH Latest Range: 5.0-8.0  6.0  Glucose, UA Latest Range: NEGATIVE mg/dL NEGATIVE  Bilirubin Urine Latest Range: NEGATIVE  SMALL (A)  Ketones, ur Latest Range: NEGATIVE mg/dL TRACE (A)  Protein Latest Range: NEGATIVE mg/dL NEGATIVE  Urobilinogen, UA Latest Range: 0.0-1.0 mg/dL 1.0  Nitrite Latest Range: NEGATIVE  NEGATIVE  Leukocytes, UA Latest Range: NEGATIVE  NEGATIVE  WBC, UA Latest Range: <3 WBC/hpf 3-6  RBC / HPF Latest Range: <3 RBC/hpf 3-6  Bacteria, UA Latest Range: RARE  RARE  Crystals Latest Range: NEGATIVE  URIC ACID CRYSTALS (A)   Micro Results: Recent Results (from the past 240 hour(s))  CULTURE, BLOOD (ROUTINE X 2)     Status: Normal (Preliminary result)   Collection Time   12/13/10  6:45 AM      Component Value  Range Status Comment   Specimen Description BLOOD RIGHT ARM   Final    Special Requests BOTTLES DRAWN AEROBIC AND ANAEROBIC 6CC   Final    Culture NO GROWTH 3 DAYS   Final    Report Status PENDING   Incomplete   URINE CULTURE     Status: Normal   Collection Time   12/13/10  6:55 AM      Component Value Range Status Comment   Specimen Description URINE, CATHETERIZED   Final    Special Requests NONE   Final    Setup Time [id number]   Final    Colony Count >=100,000 COLONIES/ML   Final    Culture ESCHERICHIA COLI   Final    Report Status 12/15/2010 FINAL   Final    Organism ID, Bacteria ESCHERICHIA COLI   Final   CULTURE, BLOOD (ROUTINE X 2)     Status: Normal (Preliminary result)   Collection Time   12/13/10  7:15 AM      Component Value Range Status Comment   Specimen Description BLOOD RIGHT ARM   Final    Special Requests BOTTLES DRAWN AEROBIC AND ANAEROBIC 6CC   Final    Culture NO GROWTH 3 DAYS   Final    Report Status PENDING   Incomplete   MRSA PCR SCREENING     Status: Normal   Collection Time   12/13/10  1:45 PM      Component Value Range Status Comment   MRSA by PCR NEGATIVE  NEGATIVE  Final   CULTURE, BLOOD (ROUTINE X 2)     Status: Normal (Preliminary result)   Collection Time   12/16/10  5:13 PM      Component Value Range Status Comment   Specimen Description BLOOD LEFT ANTECUBITAL   Final    Special Requests BOTTLES DRAWN AEROBIC AND ANAEROBIC 6CC   Final    Culture PENDING   Incomplete    Report Status PENDING   Incomplete   CULTURE, BLOOD (ROUTINE X 2)     Status: Normal (Preliminary result)   Collection Time   12/16/10  5:14 PM      Component Value Range Status Comment   Specimen Description BLOOD PICC LINE   Final    Special Requests     Final    Value: BOTTLES DRAWN AEROBIC AND ANAEROBIC 7CC DRAWN BY RN   Culture PENDING   Incomplete    Report Status PENDING   Incomplete    Studies/Results: Ct Angio Chest W/cm &/or Wo Cm  12/15/2010  *RADIOLOGY  REPORT*  Clinical Data:  Unexplained respiratory failure.  CT ANGIOGRAPHY CHEST WITH CONTRAST  Technique:  Multidetector CT imaging of the chest was performed  using the standard protocol during bolus administration of intravenous contrast.  Multiplanar CT image reconstructions including MIPs were obtained to evaluate the vascular anatomy.  Contrast: OMNIPAQUE IOHEXOL 300 MG/ML IV SOLN  Comparison:  No comparison chest CT.  Findings:  No pulmonary embolus identified.  Consolidation lower lobes (greater on the right) and posterior aspect the right upper lobe with patchy consolidation remainder of right upper lobe.  The bronchus extending into the right lower lobe is narrowed.  This will require follow-up to exclude surrounding mass.  Mucous plug not excluded.  Mediastinal adenopathy most notable subcarinal position.  Cardiomegaly.  Limit evaluation of the aorta given the phase of contrast enhancement without gross abnormality identified.  Fatty infiltration of liver.  No free air in the upper abdomen noted.  PICC line tip proximal superior vena cava.  Degenerative changes thoracic spine.  Lower cervical spine with poor definition of endplates which may represent degenerative changes.  Infection not excluded in the proper clinical setting.  Review of the MIP images confirms the above findings.  IMPRESSION: No pulmonary embolus noted.  Bilateral lower lobe and right upper lobe areas of consolidation. The bronchus supplying the right lower lobe is narrowed.  Please see above discussion.  Adenopathy most notable subcarinal position.  Cardiomegaly.  Lower cervical spine with poor definition of endplates which may represent degenerative changes.  Infection not excluded in the proper clinical setting.  This will be called to the floor by [facility] radiology technologist  Original Report Authenticated By: Fuller Canada, M.D.   Dg Chest Port 1 View  12/15/2010  *RADIOLOGY REPORT*  Clinical Data: PICC line placement.   PORTABLE CHEST - 1 VIEW  Comparison: 12/14/2010.  Findings: Right PICC line tip proximal superior vena cava level.  Interval development of consolidation lung bases greater on the right.  This may represent pleural fluid, atelectasis and / or infiltrate.  Pulmonary vascular congestion/pulmonary edema.  No gross pneumothorax.  Left lateral lung not included present exam.  Mild cardiomegaly.  IMPRESSION: Right PICC line tip proximal superior vena cava level.  Interval development of consolidation lung bases greater on the right.  This may represent pleural fluid, atelectasis and / or infiltrate.  Pulmonary vascular congestion/pulmonary edema.  Left lateral lung not included present exam.  Original Report Authenticated By: Fuller Canada, M.D.   Scheduled Meds:    . antiseptic oral rinse  15 mL Mouth Rinse Q4H  . azithromycin  500 mg Intravenous Q24H  . haloperidol lactate  2 mg Intravenous TID  . levothyroxine  50 mcg Intravenous Daily  . LORazepam  1 mg Intravenous Q4H  . nicotine  21 mg Transdermal Daily  . pantoprazole (PROTONIX) IV  40 mg Intravenous Q24H  . piperacillin-tazobactam (ZOSYN)  IV  3.375 g Intravenous Q8H  . potassium chloride  10 mEq Intravenous Q1 Hr x 4  . sodium chloride  10 mL Intracatheter Q12H  . vancomycin  750 mg Intravenous Q12H  . DISCONTD:  HYDROmorphone (DILAUDID) injection  1 mg Intravenous Q4H  . DISCONTD:  HYDROmorphone (DILAUDID) injection  2 mg Intravenous Q4H   Continuous Infusions:    . dexmedetomidine (PRECEDEX) IV infusion for high rates Stopped (12/16/10 0958)  . dextrose 5 % with KCl 20 mEq / L    . DISCONTD: sodium chloride 75 mL/hr at 12/17/10 0600   PRN Meds:.acetaminophen, albuterol, LORazepam, ondansetron (ZOFRAN) IV, sodium chloride  Assessment/Plan:   *Encephalopathy continues. I suspect this is multifactorial. Suspect withdrawal from benzodiazepines, opiate analgesics.  Also from toxic metabolic encephalopathy. Doubt significant  contribution of increase Celexa dose at this point, she because she is too far out from last dose. Of concern, is prolonged hypoxia prior to admission. Ammonia levels are now normal, so this should is not contributing at this point. Rule out subclinical seizures. I will order an EEG.she has been off the Precedex and is currently getting scheduled doses of Ativan and Dilaudid without significant improvement. Neurology has been consulted. Continues to require restraints because she has pulling out lines and trying to get out of bed. She is too agitated to undergo MRI of the brain.  Fever: Followup repeat culture results. Continue current regimen.  Bilateral pneumonia with narrowing of right lower lobe bronchus: See above. Will need a repeat CAT scan in 3-6 months after resolution of her acute illness.  ischemic hepatopathy: Liver enzymes INR nearly back to normal. Ammonia level normal. No further workup needed.   Acute respiratory failure with hypercapnia and hypoxia: Patient has evidence of bilateral pneumonia. I suspect she had a pulmonary infection prior to admission that did not show up on chest x-rays and that this may have been community-acquired or aspiration related. Continue Zosyn and azithromycin. Check urine for Legionella antigen, pneumococcus antigen. PCR for influenza. Also has most likely sleep apnea which is previously been undiagnosed as far as I can tell. No pulmonary edema seen on CAT scan, but will proceed with echocardiogram to rule out an element of heart failure.    HYPERTENSION, BENIGN SYSTEMIC: Medications held   Escherichia coli UTI (lower urinary tract infection): Pansensitive except for ampicillin. Zosyn will cover  Hypernatremia: Change IV fluid to D5W.  Hypokalemia: Replete IV. Magnesium level normal.  HYPERCHOLESTEROLEMIA: Statins stopped   OBESITY, NOS   ANXIETY: On chronic benzodiazepines   SMOKER: Not wheezing currently   Ankylosing spondylitis   CKD  (chronic kidney disease), stage III   Macrocytosis  Hypothyroidism: Continue current and repeat thyroid function tests as an outpatient.  Critical care time 30 minutes.   LOS: 4 days   Rashell Shambaugh L 12/17/2010, 9:07 AM

## 2010-12-17 NOTE — Consult Note (Signed)
Reason for Consult: Referring Physician:   ADDELYN Paul is an 50 y.o. female.  HPI:   Past Medical History  Diagnosis Date  . Allergy   . Arthritis   . Depression   . GERD (gastroesophageal reflux disease)   . Hyperlipidemia   . Hypertension   . CKD (chronic kidney disease) stage 3, GFR 30-59 ml/min   . Osteoporosis   . Hypothyroidism   . Anxiety   . Chronic pain syndrome   . Tobacco abuse   . Obesity   . Gastroparesis   . Ankylosing spondylitis   . Nephrolithiasis     Past Surgical History  Procedure Date  . Total knee arthroplasty     left  . Back surgery   . Neck surgery   . Appendectomy   . Cholecystectomy   . Right knee arthroplasty     Family History  Problem Relation Age of Onset  . Diabetes Mother   . Heart failure Mother   . Hyperlipidemia Mother   . Hypertension Mother   . Hyperlipidemia Father   . Cancer Son     lymphoma stage 4 at 29    Social History:  reports that she has been smoking Cigarettes.  She has been smoking about .5 packs per day. She does not have any smokeless tobacco history on file. She reports that she does not drink alcohol or use illicit drugs.  Allergies:  Allergies  Allergen Reactions  . Codeine     REACTION: N/V  . Latex     Medications:  Prior to Admission medications   Medication Sig Start Date End Date Taking? Authorizing Provider  ALPRAZolam Prudy Feeler) 0.5 MG tablet Take 0.5 mg by mouth 2 (two) times daily as needed. Anxiety    Historical Provider, MD  aspirin EC 81 MG tablet Take 81 mg by mouth daily.      Historical Provider, MD  Cholecalciferol (VITAMIN D-3) 5000 UNITS TABS Take 1 capsule by mouth daily.      Historical Provider, MD  citalopram (CELEXA) 20 MG tablet Take 2 tablets (40 mg total) by mouth every morning. 12/07/10   [name], DO  cyclobenzaprine (FLEXERIL) 10 MG tablet Take 10 mg by mouth at bedtime.      Historical Provider, MD  docusate sodium (COLACE) 100 MG capsule Take 100 mg by mouth 2  (two) times daily.      Historical Provider, MD  gabapentin (NEURONTIN) 100 MG capsule Take 200 mg by mouth 3 (three) times daily. Take 2 tabs by mouth two times a day    Historical Provider, MD  lansoprazole (PREVACID) 30 MG capsule TAKE 1 CAPSULE BY MOUTH EVERY DAY 11/24/10   [name], DO  levothyroxine (SYNTHROID, LEVOTHROID) 100 MCG tablet Take 1 tablet (100 mcg total) by mouth daily. 05/21/10   [name], DO  loperamide (IMODIUM) 2 MG capsule Take 2 mg by mouth 4 (four) times daily as needed. Diarrhea     Historical Provider, MD  metoCLOPramide (REGLAN) 5 MG tablet Take 5 mg by mouth 4 (four) times daily.  05/10/10   Historical Provider, MD  nortriptyline (PAMELOR) 25 MG capsule TAKE 1 CAPSULE BY MOUTH EVERY NIGHT AT BEDTIME 11/24/10   [name], DO  oxyCODONE (OXYCONTIN) 20 MG 12 hr tablet Take 20 mg by mouth every 12 (twelve) hours.      Historical Provider, MD  pravastatin (PRAVACHOL) 20 MG tablet Take 20 mg by mouth Daily. 10/03/10   Historical Provider, MD  Scheduled Meds:   . antiseptic oral rinse  15 mL Mouth Rinse Q4H  . azithromycin  500 mg Intravenous Q24H  .  HYDROmorphone (DILAUDID) injection  2 mg Intravenous Q4H  . levothyroxine  50 mcg Intravenous Daily  . LORazepam  1 mg Intravenous Q4H  . nicotine  21 mg Transdermal Daily  . pantoprazole (PROTONIX) IV  40 mg Intravenous Q24H  . piperacillin-tazobactam (ZOSYN)  IV  3.375 g Intravenous Q8H  . potassium chloride  10 mEq Intravenous Q1 Hr x 4  . sodium chloride  10 mL Intracatheter Q12H  . vancomycin  750 mg Intravenous Q12H  . DISCONTD:  HYDROmorphone (DILAUDID) injection  1 mg Intravenous Q4H   Continuous Infusions:   . dexmedetomidine (PRECEDEX) IV infusion for high rates Stopped (12/16/10 0958)  . dextrose 5 % with KCl 20 mEq / L    . DISCONTD: sodium chloride 75 mL/hr at 12/17/10 0600   PRN Meds:.acetaminophen, albuterol, LORazepam, ondansetron (ZOFRAN) IV, sodium chloride   Results for orders  placed during the hospital encounter of 12/13/10 (from the past 48 hour(s))  AMMONIA     Status: Normal   Collection Time   12/15/10  4:10 PM      Component Value Range Comment   Ammonia 32  11 - 60 (umol/L)   COMPREHENSIVE METABOLIC PANEL     Status: Abnormal   Collection Time   12/16/10  4:47 AM      Component Value Range Comment   Sodium 151 (*) 135 - 145 (mEq/L)    Potassium 2.9 (*) 3.5 - 5.1 (mEq/L)    Chloride 106  96 - 112 (mEq/L)    CO2 38 (*) 19 - 32 (mEq/L)    Glucose, Bld 146 (*) 70 - 99 (mg/dL)    BUN 25 (*) 6 - 23 (mg/dL)    Creatinine, Ser 1.30 (*) 0.50 - 1.10 (mg/dL)    Calcium 8.7  8.4 - 10.5 (mg/dL)    Total Protein 5.6 (*) 6.0 - 8.3 (g/dL)    Albumin 2.7 (*) 3.5 - 5.2 (g/dL)    AST 865 (*) 0 - 37 (U/L)    ALT 786 (*) 0 - 35 (U/L)    Alkaline Phosphatase 146 (*) 39 - 117 (U/L)    Total Bilirubin 1.2  0.3 - 1.2 (mg/dL)    GFR calc non Af Amer 49 (*) >90 (mL/min)    GFR calc Af Amer 57 (*) >90 (mL/min)   PROTIME-INR     Status: Abnormal   Collection Time   12/16/10  4:47 AM      Component Value Range Comment   Prothrombin Time 17.4 (*) 11.6 - 15.2 (seconds)    INR 1.40  0.00 - 1.49    BILIRUBIN, DIRECT     Status: Abnormal   Collection Time   12/16/10  4:47 AM      Component Value Range Comment   Bilirubin, Direct 0.5 (*) 0.0 - 0.3 (mg/dL)   MAGNESIUM     Status: Normal   Collection Time   12/16/10  4:47 AM      Component Value Range Comment   Magnesium 2.1  1.5 - 2.5 (mg/dL)   INFLUENZA PANEL BY PCR     Status: Normal   Collection Time   12/16/10  9:10 AM      Component Value Range Comment   Influenza A By PCR NEGATIVE  NEGATIVE     Influenza B By PCR NEGATIVE  NEGATIVE  H1N1 flu by pcr NOT DETECTED  NOT DETECTED    STREP PNEUMONIAE URINARY ANTIGEN     Status: Normal   Collection Time   12/16/10 10:10 AM      Component Value Range Comment   Strep Pneumo Urinary Antigen NEGATIVE  NEGATIVE    CULTURE, BLOOD (ROUTINE X 2)     Status: Normal  (Preliminary result)   Collection Time   12/16/10  5:13 PM      Component Value Range Comment   Specimen Description BLOOD LEFT ANTECUBITAL      Special Requests BOTTLES DRAWN AEROBIC AND ANAEROBIC 6CC      Culture PENDING      Report Status PENDING     CULTURE, BLOOD (ROUTINE X 2)     Status: Normal (Preliminary result)   Collection Time   12/16/10  5:14 PM      Component Value Range Comment   Specimen Description BLOOD PICC LINE      Special Requests        Value: BOTTLES DRAWN AEROBIC AND ANAEROBIC 7CC DRAWN BY RN   Culture PENDING      Report Status PENDING     URINALYSIS, ROUTINE W REFLEX MICROSCOPIC     Status: Abnormal   Collection Time   12/16/10  6:37 PM      Component Value Range Comment   Color, Urine YELLOW  YELLOW     Appearance CLEAR  CLEAR     Specific Gravity, Urine 1.020  1.005 - 1.030     pH 6.0  5.0 - 8.0     Glucose, UA NEGATIVE  NEGATIVE (mg/dL)    Hgb urine dipstick SMALL (*) NEGATIVE     Bilirubin Urine SMALL (*) NEGATIVE     Ketones, ur TRACE (*) NEGATIVE (mg/dL)    Protein, ur NEGATIVE  NEGATIVE (mg/dL)    Urobilinogen, UA 1.0  0.0 - 1.0 (mg/dL)    Nitrite NEGATIVE  NEGATIVE     Leukocytes, UA NEGATIVE  NEGATIVE    URINE MICROSCOPIC-ADD ON     Status: Abnormal   Collection Time   12/16/10  6:37 PM      Component Value Range Comment   WBC, UA 3-6  <3 (WBC/hpf)    RBC / HPF 3-6  <3 (RBC/hpf)    Bacteria, UA RARE  RARE     Crystals URIC ACID CRYSTALS (*) NEGATIVE    COMPREHENSIVE METABOLIC PANEL     Status: Abnormal   Collection Time   12/17/10  5:01 AM      Component Value Range Comment   Sodium 154 (*) 135 - 145 (mEq/L)    Potassium 3.4 (*) 3.5 - 5.1 (mEq/L)    Chloride 109  96 - 112 (mEq/L)    CO2 40 (*) 19 - 32 (mEq/L)    Glucose, Bld 110 (*) 70 - 99 (mg/dL)    BUN 23  6 - 23 (mg/dL)    Creatinine, Ser 6.96 (*) 0.50 - 1.10 (mg/dL)    Calcium 8.3 (*) 8.4 - 10.5 (mg/dL)    Total Protein 5.4 (*) 6.0 - 8.3 (g/dL)    Albumin 2.8 (*) 3.5 - 5.2  (g/dL)    AST 65 (*) 0 - 37 (U/L)    ALT 518 (*) 0 - 35 (U/L)    Alkaline Phosphatase 118 (*) 39 - 117 (U/L)    Total Bilirubin 1.4 (*) 0.3 - 1.2 (mg/dL)    GFR calc non Af Amer 45 (*) >90 (mL/min)  GFR calc Af Amer 52 (*) >90 (mL/min)   PROTIME-INR     Status: Abnormal   Collection Time   12/17/10  5:01 AM      Component Value Range Comment   Prothrombin Time 18.0 (*) 11.6 - 15.2 (seconds)    INR 1.46  0.00 - 1.49    CBC     Status: Abnormal   Collection Time   12/17/10  5:01 AM      Component Value Range Comment   WBC 12.5 (*) 4.0 - 10.5 (K/uL)    RBC 3.46 (*) 3.87 - 5.11 (MIL/uL)    Hemoglobin 11.3 (*) 12.0 - 15.0 (g/dL)    HCT 16.1  09.6 - 04.5 (%)    MCV 106.9 (*) 78.0 - 100.0 (fL)    MCH 32.7  26.0 - 34.0 (pg)    MCHC 30.5  30.0 - 36.0 (g/dL)    RDW 40.9 (*) 81.1 - 15.5 (%)    Platelets 158  150 - 400 (K/uL)     Ct Angio Chest W/cm &/or Wo Cm  12/15/2010  *RADIOLOGY REPORT*  Clinical Data:  Unexplained respiratory failure.  CT ANGIOGRAPHY CHEST WITH CONTRAST  Technique:  Multidetector CT imaging of the chest was performed using the standard protocol during bolus administration of intravenous contrast.  Multiplanar CT image reconstructions including MIPs were obtained to evaluate the vascular anatomy.  Contrast: OMNIPAQUE IOHEXOL 300 MG/ML IV SOLN  Comparison:  No comparison chest CT.  Findings:  No pulmonary embolus identified.  Consolidation lower lobes (greater on the right) and posterior aspect the right upper lobe with patchy consolidation remainder of right upper lobe.  The bronchus extending into the right lower lobe is narrowed.  This will require follow-up to exclude surrounding mass.  Mucous plug not excluded.  Mediastinal adenopathy most notable subcarinal position.  Cardiomegaly.  Limit evaluation of the aorta given the phase of contrast enhancement without gross abnormality identified.  Fatty infiltration of liver.  No free air in the upper abdomen noted.  PICC  line tip proximal superior vena cava.  Degenerative changes thoracic spine.  Lower cervical spine with poor definition of endplates which may represent degenerative changes.  Infection not excluded in the proper clinical setting.  Review of the MIP images confirms the above findings.  IMPRESSION: No pulmonary embolus noted.  Bilateral lower lobe and right upper lobe areas of consolidation. The bronchus supplying the right lower lobe is narrowed.  Please see above discussion.  Adenopathy most notable subcarinal position.  Cardiomegaly.  Lower cervical spine with poor definition of endplates which may represent degenerative changes.  Infection not excluded in the proper clinical setting.  This will be called to the floor by [facility] radiology technologist  Original Report Authenticated By: Fuller Canada, M.D.   Dg Chest Port 1 View  12/15/2010  *RADIOLOGY REPORT*  Clinical Data: PICC line placement.  PORTABLE CHEST - 1 VIEW  Comparison: 12/14/2010.  Findings: Right PICC line tip proximal superior vena cava level.  Interval development of consolidation lung bases greater on the right.  This may represent pleural fluid, atelectasis and / or infiltrate.  Pulmonary vascular congestion/pulmonary edema.  No gross pneumothorax.  Left lateral lung not included present exam.  Mild cardiomegaly.  IMPRESSION: Right PICC line tip proximal superior vena cava level.  Interval development of consolidation lung bases greater on the right.  This may represent pleural fluid, atelectasis and / or infiltrate.  Pulmonary vascular congestion/pulmonary edema.  Left lateral lung not  included present exam.  Original Report Authenticated By: Fuller Canada, M.D.    Review of Systems  Unable to perform ROS: mental status change   Blood pressure 123/65, pulse 95, temperature 99.1 F (37.3 C), temperature source Axillary, resp. rate 9, height 5\' 2"  (1.575 m), weight 96.1 kg (211 lb 13.8 oz), last menstrual period 05/17/2009, SpO2  89.00%. Physical Exam  Assessment/Plan: See dictated note  Kristena Wilhelmi 12/17/2010, 8:13 AM

## 2010-12-18 ENCOUNTER — Other Ambulatory Visit (HOSPITAL_COMMUNITY): Payer: BC Managed Care – PPO

## 2010-12-18 ENCOUNTER — Inpatient Hospital Stay (HOSPITAL_COMMUNITY): Payer: BC Managed Care – PPO

## 2010-12-18 LAB — CULTURE, BLOOD (ROUTINE X 2)
Culture: NO GROWTH
Culture: NO GROWTH

## 2010-12-18 LAB — URINE CULTURE
Culture  Setup Time: 201211252232
Culture: NO GROWTH

## 2010-12-18 LAB — COMPREHENSIVE METABOLIC PANEL
ALT: 342 U/L — ABNORMAL HIGH (ref 0–35)
AST: 47 U/L — ABNORMAL HIGH (ref 0–37)
Albumin: 2.7 g/dL — ABNORMAL LOW (ref 3.5–5.2)
CO2: 37 mEq/L — ABNORMAL HIGH (ref 19–32)
Calcium: 8.5 mg/dL (ref 8.4–10.5)
GFR calc non Af Amer: 45 mL/min — ABNORMAL LOW (ref >90)
Sodium: 156 mEq/L — ABNORMAL HIGH (ref 135–145)
Total Protein: 5.5 g/dL — ABNORMAL LOW (ref 6.0–8.3)

## 2010-12-18 LAB — BLOOD GAS, ARTERIAL
Patient temperature: 37
pCO2 arterial: 56 mmHg — ABNORMAL HIGH (ref 35.0–45.0)
pH, Arterial: 7.393 (ref 7.350–7.400)

## 2010-12-18 LAB — PRO B NATRIURETIC PEPTIDE: Pro B Natriuretic peptide (BNP): 6841 pg/mL — ABNORMAL HIGH (ref 0–125)

## 2010-12-18 LAB — MAGNESIUM: Magnesium: 2.4 mg/dL (ref 1.5–2.5)

## 2010-12-18 MED ORDER — CLINIMIX E/DEXTROSE (5/15) 5 % IV SOLN
INTRAVENOUS | Status: AC
  Administered 2010-12-18: 18:00:00 via INTRAVENOUS
  Filled 2010-12-18: qty 2000

## 2010-12-18 MED ORDER — IPRATROPIUM BROMIDE 0.02 % IN SOLN
0.5000 mg | Freq: Four times a day (QID) | RESPIRATORY_TRACT | Status: DC
  Administered 2010-12-18 – 2010-12-23 (×21): 0.5 mg via RESPIRATORY_TRACT
  Filled 2010-12-18 (×24): qty 2.5

## 2010-12-18 MED ORDER — SODIUM CHLORIDE 0.9 % IN NEBU
INHALATION_SOLUTION | RESPIRATORY_TRACT | Status: AC
Start: 2010-12-18 — End: 2010-12-18
  Administered 2010-12-18: 3 mL
  Filled 2010-12-18: qty 3

## 2010-12-18 MED ORDER — POTASSIUM CL IN DEXTROSE 5% 20 MEQ/L IV SOLN: 20.0000 meq | INTRAVENOUS | Status: AC

## 2010-12-18 MED ORDER — POTASSIUM CL IN DEXTROSE 5% 20 MEQ/L IV SOLN: 20.0000 meq | INTRAVENOUS | Status: DC

## 2010-12-18 MED ORDER — FAT EMULSION 20 % IV EMUL: 250.0000 mL | INTRAVENOUS | Status: DC

## 2010-12-18 MED ORDER — LORAZEPAM 2 MG/ML IJ SOLN
INTRAMUSCULAR | Status: AC
  Administered 2010-12-18: 2 mg via INTRAVENOUS
  Filled 2010-12-18: qty 1

## 2010-12-18 MED ORDER — POTASSIUM CHLORIDE 10 MEQ/100ML IV SOLN
10.0000 meq | INTRAVENOUS | Status: AC
  Administered 2010-12-18 (×4): 10 meq via INTRAVENOUS
  Filled 2010-12-18: qty 400

## 2010-12-18 MED ORDER — ALBUTEROL SULFATE (5 MG/ML) 0.5% IN NEBU
2.5000 mg | INHALATION_SOLUTION | Freq: Four times a day (QID) | RESPIRATORY_TRACT | Status: DC
  Administered 2010-12-18 – 2010-12-23 (×21): 2.5 mg via RESPIRATORY_TRACT
  Filled 2010-12-18 (×24): qty 0.5

## 2010-12-18 MED ORDER — LORAZEPAM 2 MG/ML IJ SOLN
INTRAMUSCULAR | Status: AC
  Filled 2010-12-18: qty 1

## 2010-12-18 MED ORDER — POTASSIUM CL IN DEXTROSE 5% 20 MEQ/L IV SOLN
20.0000 meq | INTRAVENOUS | Status: DC
  Administered 2010-12-19: 20 meq via INTRAVENOUS

## 2010-12-18 MED ORDER — FUROSEMIDE 10 MG/ML IJ SOLN
40.0000 mg | Freq: Once | INTRAMUSCULAR | Status: AC
  Administered 2010-12-18: 40 mg via INTRAVENOUS
  Filled 2010-12-18: qty 4

## 2010-12-18 MED ORDER — ACETAMINOPHEN 650 MG RE SUPP
RECTAL | Status: AC
  Filled 2010-12-18: qty 1

## 2010-12-18 NOTE — Progress Notes (Signed)
INITIAL ADULT NUTRITION ASSESSMENT Date: 12/18/2010   Time: 4:18 PM  Reason for Assessment: New TPN  ASSESSMENT: Female 50 y.o.  Dx: Encephalopathy acute   Past Medical History  Diagnosis Date  . Allergy   . Arthritis   . Depression   . GERD (gastroesophageal reflux disease)   . Hyperlipidemia   . Hypertension   . CKD (chronic kidney disease) stage 3, GFR 30-59 ml/min   . Osteoporosis   . Hypothyroidism   . Anxiety   . Chronic pain syndrome   . Tobacco abuse   . Obesity   . Gastroparesis   . Ankylosing spondylitis   . Nephrolithiasis    Scheduled Meds:   . acetaminophen      . acetaminophen      . albuterol  2.5 mg Nebulization QID  . antiseptic oral rinse  15 mL Mouth Rinse Q4H  . azithromycin  500 mg Intravenous Q24H  . furosemide  40 mg Intravenous Once  . haloperidol lactate  2 mg Intravenous TID  . ipratropium  0.5 mg Nebulization QID  . levothyroxine  50 mcg Intravenous Daily  . LORazepam      . LORazepam  2 mg Intravenous Q4H  . nicotine  21 mg Transdermal Daily  . pantoprazole (PROTONIX) IV  40 mg Intravenous Q24H  . piperacillin-tazobactam (ZOSYN)  IV  3.375 g Intravenous Q8H  . potassium chloride  10 mEq Intravenous Q1 Hr x 4  . sodium chloride  10 mL Intracatheter Q12H  . sodium chloride      . vancomycin  750 mg Intravenous Q12H   Continuous Infusions:   . dextrose 5 % with KCl 20 mEq / L    . dextrose 5 % with KCl 20 mEq / L    . TPN (CLINIMIX) +/- additives    . DISCONTD: dextrose 5 % with KCl 20 mEq / L 20 mEq (12/18/10 0600)  . DISCONTD: dextrose 5 % with KCl 20 mEq / L 20 mEq (12/18/10 1400)  . DISCONTD: fat emulsion     PRN Meds:.acetaminophen, albuterol, LORazepam, ondansetron (ZOFRAN) IV, sodium chloride  Ht: 5\' 2"  (157.5 cm)  Wt: 211 lb 13.8 oz (96.1 kg)  Ideal Wt: 50.1 kg 54.6 kg % Ideal Wt: 192%  Usual Wt: 215.9# (97.8 kg) admission wt % Usual Wt: 98%  Body mass index is 38.75 kg/(m^2).   CMP     Component Value  Date/Time   NA 156* 12/18/2010 0453   K 3.1* 12/18/2010 0453   CL 112 12/18/2010 0453   CO2 37* 12/18/2010 0453   GLUCOSE 129* 12/18/2010 0453   BUN 19 12/18/2010 0453   CREATININE 1.34* 12/18/2010 0453   CREATININE 1.20* 12/07/2010 0942   CALCIUM 8.5 12/18/2010 0453   PROT 5.5* 12/18/2010 0453   ALBUMIN 2.7* 12/18/2010 0453   AST 47* 12/18/2010 0453   ALT 342* 12/18/2010 0453   ALKPHOS 101 12/18/2010 0453   BILITOT 1.3* 12/18/2010 0453   GFRNONAA 45* 12/18/2010 0453   GFRAA 53* 12/18/2010 0453   CBC    Component Value Date/Time   WBC 12.5* 12/17/2010 0501   RBC 3.46* 12/17/2010 0501   HGB 11.3* 12/17/2010 0501   HCT 37.0 12/17/2010 0501   PLT 158 12/17/2010 0501   MCV 106.9* 12/17/2010 0501   MCH 32.7 12/17/2010 0501   MCHC 30.5 12/17/2010 0501   RDW 17.9* 12/17/2010 0501   LYMPHSABS 1.7 12/13/2010 0630   MONOABS 0.7 12/13/2010 0630   EOSABS 0.0 12/13/2010 0630  BASOSABS 0.0 12/13/2010 0630   CBG (last 3)  No results found for this basename: GLUCAP:3 in the last 72 hours   Intake/Output Summary (Last 24 hours) at 12/18/10 1622 Last data filed at 12/18/10 1402  Gross per 24 hour  Intake   1340 ml  Output    425 ml  Net    915 ml     Diet Order: NPO-d 5 (unable to adv diet or place NGT due to pt agitation)   IVF:    dextrose 5 % with KCl 20 mEq / L   dextrose 5 % with KCl 20 mEq / L   TPN (CLINIMIX) +/- additives   DISCONTD: dextrose 5 % with KCl 20 mEq / L Last Rate: 20 mEq (12/18/10 0600)  DISCONTD: dextrose 5 % with KCl 20 mEq / L Last Rate: 20 mEq (12/18/10 1400)  DISCONTD: fat emulsion     Estimated Nutritional Needs:   Kcal:1626-1871 Protein:100-120 grams Fluid:1.6-1.9 L/d (14ml/kcal)  NUTRITION DIAGNOSIS: -Inadequate oral intake (NI-2.1).  Status: Ongoing  RELATED TO:  -continued agitation, acute encephalopathy   AS EVIDENCE BY:  -nursing/MD report, and NPO status  MONITORING/EVALUATION(Goals): -Pt will tol TPN without signs or  symptoms of refeeding and successfully adv to meet est energy and protein needs  EDUCATION NEEDS: -Education not appropriate at this time  INTERVENTION: -Clinimix 5/15 @ 50 ml/hr (provides-852 kcal, 60 gr protein)  Dietitian #[id number]  DOCUMENTATION CODES Per approved criteria  -Obesity Unspecified    Francene Boyers 12/18/2010, 4:18 PM

## 2010-12-18 NOTE — Progress Notes (Signed)
Pts husband is at bedside and requesting to speak to MD about possible transfer to [facility] or [place]. Dr. Lendell Caprice paged. MD spoke to patient's husband via phone.

## 2010-12-18 NOTE — Consult Note (Signed)
PARENTERAL NUTRITION CONSULT NOTE - INITIAL  Pharmacy Consult for TPN Indication: unable to take food orally, too agitated for TF placement  Allergies  Allergen Reactions  . Codeine     REACTION: N/V  . Latex    Patient Measurements: Height: 5\' 2"  (157.5 cm) Weight: 211 lb 13.8 oz (96.1 kg) IBW/kg (Calculated) : 50.1   Vital Signs: Temp: 100.5 F (38.1 C) (11/27 0839) Temp src: Axillary (11/27 0839) BP: 106/78 mmHg (11/27 0500) Pulse Rate: 97  (11/27 0600) Intake/Output from previous day: 11/26 0701 - 11/27 0700 In: 1700 [I.V.:1350; IV Piggyback:350] Out: 775 [Urine:775] Intake/Output from this shift:   Labs:  [facility] 12/17/10 0501 12/16/10 0447  WBC 12.5* --  HGB 11.3* --  HCT 37.0 --  PLT 158 --  APTT -- --  INR 1.46 1.40    Basename 12/18/10 0944 12/18/10 0453 12/17/10 0501 12/16/10 0447  NA -- 156* 154* 151*  K -- 3.1* 3.4* 2.9*  CL -- 112 109 106  CO2 -- 37* 40* 38*  GLUCOSE -- 129* 110* 146*  BUN -- 19 23 25*  CREATININE -- 1.34* 1.36* 1.26*  LABCREA -- -- -- --  CREAT24HRUR -- -- -- --  CALCIUM -- 8.5 8.3* 8.7  MG 2.4 -- -- 2.1  PHOS -- -- -- --  PROT -- 5.5* 5.4* 5.6*  ALBUMIN -- 2.7* 2.8* 2.7*  AST -- 47* 65* 153*  ALT -- 342* 518* 786*  ALKPHOS -- 101 118* 146*  BILITOT -- 1.3* 1.4* 1.2  BILIDIR -- -- -- 0.5*  IBILI -- -- -- --  PREALBUMIN -- -- -- --  CHOLHDL -- -- -- --  CHOL -- -- -- --   Estimated Creatinine Clearance: 54.3 ml/min (by C-G formula based on Cr of 1.34).   No results found for this basename: GLUCAP:3 in the last 72 hours  Medical History: Past Medical History  Diagnosis Date  . Allergy   . Arthritis   . Depression   . GERD (gastroesophageal reflux disease)   . Hyperlipidemia   . Hypertension   . CKD (chronic kidney disease) stage 3, GFR 30-59 ml/min   . Osteoporosis   . Hypothyroidism   . Anxiety   . Chronic pain syndrome   . Tobacco abuse   . Obesity   . Gastroparesis   . Ankylosing spondylitis   .  Nephrolithiasis    Medications:  Scheduled:    . acetaminophen      . acetaminophen      . albuterol  2.5 mg Nebulization QID  . antiseptic oral rinse  15 mL Mouth Rinse Q4H  . azithromycin  500 mg Intravenous Q24H  . haloperidol lactate  2 mg Intravenous TID  . ipratropium  0.5 mg Nebulization QID  . levothyroxine  50 mcg Intravenous Daily  . LORazepam      . LORazepam  2 mg Intravenous Q4H  . nicotine  21 mg Transdermal Daily  . pantoprazole (PROTONIX) IV  40 mg Intravenous Q24H  . piperacillin-tazobactam (ZOSYN)  IV  3.375 g Intravenous Q8H  . potassium chloride  10 mEq Intravenous Q1 Hr x 2  . potassium chloride  10 mEq Intravenous Q1 Hr x 4  . potassium chloride      . sodium chloride  10 mL Intracatheter Q12H  . sodium chloride      . vancomycin  750 mg Intravenous Q12H  . DISCONTD: LORazepam  1 mg Intravenous Q4H    Insulin Requirements in the past  24 hours:  n/a  Current Nutrition:  TPN  Assessment: Hypernatremia Hypokalemia Agitation pna  Nutritional Goals:  ~1800 kCal, 115-120 grams of protein per day  Plan:  Initiate TPN at 55ml/hr today, adjust IVF rate accordingly Replace K+ as needed Labs per protocol Monitor renal fxn, glucose tolerance, fluid status, lytes  [name] A 12/18/2010,10:21 AM

## 2010-12-18 NOTE — Progress Notes (Signed)
Over the past 24 hours, remained continuously agitated, requiring physical and chemical restraints. Spiked another fever.  Discussed with Dr. Gerilyn Pilgrim.  Subjective: Unable.  Objective: Vital signs in last 24 hours: Filed Vitals:   12/18/10 0400 12/18/10 0500 12/18/10 0600 12/18/10 0839  BP: 161/76 106/78    Pulse: 99 99 97   Temp:  101.4 F (38.6 C)  100.5 F (38.1 C)  TempSrc:  Axillary  Axillary  Resp: 21 22 20    Height:      Weight:      SpO2: 92% 91% 94%    Weight change:   Intake/Output Summary (Last 24 hours) at 12/18/10 0851 Last data filed at 12/18/10 0845  Gross per 24 hour  Intake   1650 ml  Output    775 ml  Net    875 ml   Physical Exam:  General: Agitated. Eyes closed. Lungs: Rhonchi throughout. Now with wheeze and prolonged expiratory phase. Breathing looks a little more labored today. Cardiovascular regular rate rhythm without murmurs gallops rubs Abdomen soft nontender nondistended Extremities trace edema. Right hand edematous from restraints Neurologic. Eyes closed, won't answer questions or follow commands. Moving all 4 extremities  Lab Results: Basic Metabolic Panel:  Lab 12/18/10 4403 12/17/10 0501 12/16/10 0447  NA 156* 154* --  K 3.1* 3.4* --  CL 112 109 --  CO2 37* 40* --  GLUCOSE 129* 110* --  BUN 19 23 --  CREATININE 1.34* 1.36* --  CALCIUM 8.5 8.3* --  MG -- -- 2.1  PHOS -- -- --   Liver Function Tests:  Lab 12/18/10 0453 12/17/10 0501  AST 47* 65*  ALT 342* 518*  ALKPHOS 101 118*  BILITOT 1.3* 1.4*  PROT 5.5* 5.4*  ALBUMIN 2.7* 2.8*   No results found for this basename: LIPASE:2,AMYLASE:2 in the last 168 hours  Lab 12/15/10 1610 12/13/10 0919  AMMONIA 32 76*   CBC:  Lab 12/17/10 0501 12/14/10 0458 12/13/10 0630  WBC 12.5* 7.4 --  NEUTROABS -- -- 9.9*  HGB 11.3* 11.7* --  HCT 37.0 36.9 --  MCV 106.9* 105.1* --  PLT 158 204 --   Cardiac Enzymes:  Lab 12/14/10 0759 12/13/10 2346 12/13/10 1551  CKTOTAL 475* 679*  575*  CKMB 3.8 4.2* 4.4*  CKMBINDEX -- -- --  TROPONINI <0.30 <0.30 <0.30   BNP:  Lab 12/14/10 0800  POCBNP 1528.0*   D-Dimer: No results found for this basename: DDIMER:2 in the last 168 hours CBG: No results found for this basename: GLUCAP:6 in the last 168 hours Hemoglobin A1C: No results found for this basename: HGBA1C in the last 168 hours Fasting Lipid Panel: No results found for this basename: CHOL,HDL,LDLCALC,TRIG,CHOLHDL,LDLDIRECT in the last 474 hours Thyroid Function Tests:  Lab 12/14/10 0800 12/13/10 1206  TSH -- 0.151*  T4TOTAL -- --  FREET4 -- 0.88  T3FREE 1.6* --  THYROIDAB -- --   Coagulation:  Lab 12/17/10 0501 12/16/10 0447 12/15/10 0524 12/14/10 0949  LABPROT 18.0* 17.4* 18.4* 18.2*  INR 1.46 1.40 1.50* 1.48   Anemia Panel:  Lab 12/13/10 1206  VITAMINB12 >2000*  FOLATE --  FERRITIN 4434*  TIBC NOT CALC  IRON 283*  RETICCTPCT --    Urine Legionella negative  Repeat urinalysis Results for PEITYN, PAYTON (MRN [id number]) as of 12/17/2010 09:11  Ref. Range 12/16/2010 18:37  Color, Urine Latest Range: YELLOW  YELLOW  Appearance Latest Range: CLEAR  CLEAR  Specific Gravity, Urine Latest Range: 1.005-1.030  1.020  pH Latest Range:  5.0-8.0  6.0  Glucose, UA Latest Range: NEGATIVE mg/dL NEGATIVE  Bilirubin Urine Latest Range: NEGATIVE  SMALL (A)  Ketones, ur Latest Range: NEGATIVE mg/dL TRACE (A)  Protein Latest Range: NEGATIVE mg/dL NEGATIVE  Urobilinogen, UA Latest Range: 0.0-1.0 mg/dL 1.0  Nitrite Latest Range: NEGATIVE  NEGATIVE  Leukocytes, UA Latest Range: NEGATIVE  NEGATIVE  WBC, UA Latest Range: <3 WBC/hpf 3-6  RBC / HPF Latest Range: <3 RBC/hpf 3-6  Bacteria, UA Latest Range: RARE  RARE  Crystals Latest Range: NEGATIVE  URIC ACID CRYSTALS (A)   Micro Results: Recent Results (from the past 240 hour(s))  CULTURE, BLOOD (ROUTINE X 2)     Status: Normal (Preliminary result)   Collection Time   12/13/10  6:45 AM      Component Value  Range Status Comment   Specimen Description BLOOD RIGHT ARM   Final    Special Requests BOTTLES DRAWN AEROBIC AND ANAEROBIC 6CC   Final    Culture NO GROWTH 4 DAYS   Final    Report Status PENDING   Incomplete   URINE CULTURE     Status: Normal   Collection Time   12/13/10  6:55 AM      Component Value Range Status Comment   Specimen Description URINE, CATHETERIZED   Final    Special Requests NONE   Final    Setup Time [id number]   Final    Colony Count >=100,000 COLONIES/ML   Final    Culture ESCHERICHIA COLI   Final    Report Status 12/15/2010 FINAL   Final    Organism ID, Bacteria ESCHERICHIA COLI   Final   CULTURE, BLOOD (ROUTINE X 2)     Status: Normal (Preliminary result)   Collection Time   12/13/10  7:15 AM      Component Value Range Status Comment   Specimen Description BLOOD RIGHT ARM   Final    Special Requests BOTTLES DRAWN AEROBIC AND ANAEROBIC 6CC   Final    Culture NO GROWTH 4 DAYS   Final    Report Status PENDING   Incomplete   MRSA PCR SCREENING     Status: Normal   Collection Time   12/13/10  1:45 PM      Component Value Range Status Comment   MRSA by PCR NEGATIVE  NEGATIVE  Final   CULTURE, BLOOD (ROUTINE X 2)     Status: Normal (Preliminary result)   Collection Time   12/16/10  5:13 PM      Component Value Range Status Comment   Specimen Description BLOOD LEFT ANTECUBITAL   Final    Special Requests BOTTLES DRAWN AEROBIC AND ANAEROBIC 6CC   Final    Culture NO GROWTH 1 DAY   Final    Report Status PENDING   Incomplete   CULTURE, BLOOD (ROUTINE X 2)     Status: Normal (Preliminary result)   Collection Time   12/16/10  5:14 PM      Component Value Range Status Comment   Specimen Description BLOOD PICC LINE   Final    Special Requests     Final    Value: BOTTLES DRAWN AEROBIC AND ANAEROBIC 7CC DRAWN BY RN   Culture NO GROWTH 1 DAY   Final    Report Status PENDING   Incomplete    Studies/Results: No results found. Scheduled Meds:    .  acetaminophen      . acetaminophen      . albuterol  2.5 mg Nebulization  QID  . antiseptic oral rinse  15 mL Mouth Rinse Q4H  . azithromycin  500 mg Intravenous Q24H  . haloperidol lactate  2 mg Intravenous TID  . ipratropium  0.5 mg Nebulization QID  . levothyroxine  50 mcg Intravenous Daily  . LORazepam      . LORazepam  2 mg Intravenous Q4H  . nicotine  21 mg Transdermal Daily  . pantoprazole (PROTONIX) IV  40 mg Intravenous Q24H  . piperacillin-tazobactam (ZOSYN)  IV  3.375 g Intravenous Q8H  . potassium chloride  10 mEq Intravenous Q1 Hr x 2  . potassium chloride  10 mEq Intravenous Q1 Hr x 4  . potassium chloride      . sodium chloride  10 mL Intracatheter Q12H  . sodium chloride      . vancomycin  750 mg Intravenous Q12H  . DISCONTD: LORazepam  1 mg Intravenous Q4H   Continuous Infusions:    . dextrose 5 % with KCl 20 mEq / L    . DISCONTD: dextrose 5 % with KCl 20 mEq / L 20 mEq (12/18/10 0600)   PRN Meds:.acetaminophen, albuterol, LORazepam, ondansetron (ZOFRAN) IV, sodium chloride  Assessment/Plan:   *Encephalopathy continues. I suspect this is multifactorial. Suspect withdrawal from benzodiazepines, opiate analgesics. Also from toxic metabolic encephalopathy. Doubt significant contribution of increase Celexa dose at this point, she because she is too far out from last dose. Of concern, is prolonged hypoxia prior to admission. Ammonia levels are now normal, so this should is not contributing at this point. Rule out subclinical seizures. EEG pending. If patient continues to spike fevers, she may need lumbar puncture, though she would be at risk to do to her severe agitation, and obesity. She would require significantly more sedation. If needed, she has responded fairly well to the Geodon 20 mg IM.Marland Kitchen Continues to require restraints because she has pulling out lines and trying to get out of bed. She is too agitated to undergo MRI of the brain. She has been almost a week now  without any nutrition. She is to agitated and encephalopathic to take by mouth. Also, nasogastric tube feeds would be difficult and she would be at risk for aspiration do to her severe agitation and pulling at lines. I will place her on TPN for now.  Fever despite broad-spectrum antibiotics. Repeat cultures have been negative. I will check another chest x-ray, and check Doppler of the legs. Defer lumbar puncture to Dr. Gerilyn Pilgrim. If workup continues to be negative, could be drug fever. She is on Zosyn, azithromycin, and vancomycin.   Bilateral pneumonia with narrowing of right lower lobe bronchus: See above. Will need a repeat CAT scan in 3-6 months after resolution of her acute illness.  ischemic hepatopathy: Liver enzymes INR nearly back to normal. Ammonia level normal. No further workup needed.   Acute respiratory failure with hypercapnia and hypoxia: Patient has evidence of bilateral pneumonia. I suspect she had a pulmonary infection prior to admission that did not show up on chest x-rays and that this may have been community-acquired or aspiration related. urinary Legionella, strep pneumonia antigen is negative. Influenza PCR negative.  Also has most likely sleep apnea which is previously been undiagnosed as far as I can tell. echocardiogram shows good ejection fraction. She appears to have more labored breathing with bronchospasm. I will add scheduled bronchodilators and check an ABG and chest x-ray and pro BNP. She has been off BiPAP for a few days, but continued to require nonrebreather mask  HYPERTENSION, BENIGN SYSTEMIC: Medications held   Escherichia coli UTI (lower urinary tract infection): Pansensitive except for ampicillin. Zosyn will cover. Repeat UA improved. Repeat urine culture negative to date.   Hypernatremia worse despite hypotonic fluids: increase  D5W to 100 cc an hour. We'll need to stop this once she is on TPN.  Hypokalemia: Replete IV. Magnesium level  normal.  HYPERCHOLESTEROLEMIA: Statins stopped   OBESITY, NOS   ANXIETY: On chronic benzodiazepines  Chronic opiates and benzodiazepines. Dilaudid was stopped by Dr. Gerilyn Pilgrim.    Ankylosing spondylitis   CKD (chronic kidney disease), stage III   Macrocytosis  Hypothyroidism: Continue current and repeat thyroid function tests as an outpatient.  Critical care time 40 minutes.   LOS: 5 days   Natalya Domzalski L 12/18/2010, 8:51 AM

## 2010-12-18 NOTE — Progress Notes (Signed)
Subjective: Interval History:   Objective: Vital signs in last 24 hours: Temp:  [100.4 F (38 C)-101.4 F (38.6 C)] 101.4 F (38.6 C) (11/27 0500) Pulse Rate:  [71-121] 97  (11/27 0600) Resp:  [17-26] 20  (11/27 0600) BP: (84-165)/(27-132) 106/78 mmHg (11/27 0500) SpO2:  [90 %-100 %] 94 % (11/27 0600) FiO2 (%):  [60 %-100 %] 60 % (11/26 2000)  Intake/Output from previous day: 11/26 0701 - 11/27 0700 In: 1700 [I.V.:1350; IV Piggyback:350] Out: 775 [Urine:775] Intake/Output this shift:   Nutritional status: NPO    Lab Results:  Basename 12/18/10 0453 12/17/10 0501  WBC -- 12.5*  HGB -- 11.3*  HCT -- 37.0  PLT -- 158  NA 156* 154*  K 3.1* 3.4*  CL 112 109  CO2 37* 40*  GLUCOSE 129* 110*  BUN 19 23  CREATININE 1.34* 1.36*  CALCIUM 8.5 8.3*  LABA1C -- --   Lipid Panel No results found for this basename: CHOL,TRIG,HDL,CHOLHDL,VLDL,LDLCALC in the last 72 hours  Studies/Results: No results found.  Medications:  Scheduled Meds:   . acetaminophen      . acetaminophen      . antiseptic oral rinse  15 mL Mouth Rinse Q4H  . azithromycin  500 mg Intravenous Q24H  . haloperidol lactate  2 mg Intravenous TID  . levothyroxine  50 mcg Intravenous Daily  . LORazepam      . LORazepam  2 mg Intravenous Q4H  . nicotine  21 mg Transdermal Daily  . pantoprazole (PROTONIX) IV  40 mg Intravenous Q24H  . piperacillin-tazobactam (ZOSYN)  IV  3.375 g Intravenous Q8H  . potassium chloride  10 mEq Intravenous Q1 Hr x 2  . potassium chloride      . sodium chloride  10 mL Intracatheter Q12H  . vancomycin  750 mg Intravenous Q12H  . DISCONTD:  HYDROmorphone (DILAUDID) injection  2 mg Intravenous Q4H  . DISCONTD: LORazepam  1 mg Intravenous Q4H   Continuous Infusions:   . dextrose 5 % with KCl 20 mEq / L 20 mEq (12/18/10 0600)   PRN Meds:.acetaminophen, albuterol, LORazepam, ondansetron (ZOFRAN) IV, sodium chloride   Assessment/Plan: See dictated note    LOS: 5 days    [name]

## 2010-12-18 NOTE — Consult Note (Signed)
[name], [name]                 ACCOUNT NO.:  [id number]  MEDICAL RECORD NO.:  [id number]  LOCATION:  IC05                          FACILITY:  [facility]  PHYSICIAN:  [name] A. [name], M.D. DATE OF BIRTH:  11-17-1960  DATE OF CONSULTATION: DATE OF DISCHARGE:                                CONSULTATION   REASON FOR CONSULTATION:  Altered mental status.  The patient is a 50 year old white female who apparently has had some confusion, acting bizarrely and unusual over the last few days before being hospitalized.  Her husband who is hospitalized at [facility] for chest pain indicated that he was talking to the patient on the phone and he does felt that she was acting strangely.  He called his brother to check on the patient.  The brother found the patient unresponsive on the floor sleeping.  He had to break into the house after trying to get in for an hour.  The patient did awake after he got there but she was again confused and acted strangely, then she was taken to the hospital.  He reports that she had strong malodorous urine spells.  She has been diagnosed as having significant UTI.  She has had what appears to be a shock liver and also bilateral lower lobe pneumonia.  She has been combative, agitated, confused.  She has been on the opioids and this has been continued apparently the husband who reports that on the weekends when she is low on the opioids, she has some confusion and agitation.  She also presented with significant AST of 8000, ALT of 3000 which has gone down.  Again, this is thought due to shock liver.  The patient continues to be confused.  She did present with elevated ammonia level, but this has normalized and the confusion has persisted hence this neurological consultation.  She has been maintained on around the clock Ativan and Dilaudid for her symptoms.  PAST MEDICAL HISTORY:  Depression, arthritis, gastroesophageal reflux disease, hyperlipidemia, chronic kidney  disease stage III, hypertension, osteoporosis, hypothyroidism, anxiety disorder.  Chronic pain syndrome, chronic tobacco use, obesity, gastroparesis, ankylosing spondylitis and nephrolithiasis.  PAST SURGICAL HISTORY:  Total left knee arthroplasty she had back surgery, neck surgery, appendectomy, cholecystectomy, and right knee arthroplasty.  ADMISSION MEDICATION:  Significant for Xanax 0.5 mg 2 times a day. Aspirin and vitamin D, Celexa; apparently this was increased from 20 to 40 mg before being admitted.  Flexeril, Colace.  Neurontin 200 mg t.i.d. Prevacid, levothyroxine, metoprolol, oxycodone 20 mg b.i.d. pravastatin. She is currently on azithromycin, Dilaudid.  She apparently spiked a temperature yesterday showed her antibiotics was expanded.  She is also now on Zosyn and vancomycin, potassium, Protonix, nicotine patch.  She also recently required pressors.  PHYSICAL EXAMINATION:  GENERAL:  Obese, agitated lady.  She moved around in bed quite significantly. VITAL SIGNS:  Blood pressure 123/65, heart rate 85, temperature 99.1. (Spiked temperature earlier overnight 101). HEENT:  Head is normocephalic and atraumatic. NECK:  Supple. ABDOMEN:  Obese but soft. EXTREMITIES:  Mild edema.  She is status post large surgical lesion at the knee bilaterally. MENTATION:  She is quite drowsy but very agitated, confused, really  does not respond with any verbal words other than moaning and groaning to deep painful stimuli.  She does not follow commands.  Cranial nerve evaluation, pupils are midposition and reactive.  She does seem to have passive slow extraocular movements.  Corneal reflexes are intact. Facial muscle strength is symmetric.  On motor examination, she has good vigorous strength bilaterally.  Bulk and tone seems normal.  Reflexes are brisk with plantars upgoing.  Sensation is responsive to painful stimuli bilaterally.  Coordination, no dysmetria.  No tremors are noted. No  parkinsonism.  CT scan shows nothing acute.  The initial urine drug screen was negative, although she is on oxycodone but the drug screen does not test for oxycodone.  INR 1.4, WBC 12, hemoglobin 13, platelet count 158. Sodium 154, potassium 3.4, chloride 109, CO2 40, BUN 23, creatinine 1.3. SGOT 40, glucose 110, calcium 8.  Liver enzymes are still high, AST 55, ALT is 518, ammonia level is 34, in the normal range.  Albumin 3.8, calcium 8.3, protein 5.4.  PCR for influenza A and B negative.  IMPRESSION:  Multifactorial, toxic metabolic encephalopathy including dehydration.  Pneumonia, urinary tract infection/urosepsis, and elevated ammonia level.  RECOMMENDATIONS:  I think I will recommend discontinuing the Dilaudid for now and we can restart at a lower dose such as 1 mg every 8 hours because she seemed to develop withdrawal symptoms.  She has persistent tachycardia and diarrhea.  I think we can use Haldol to help control these aggressive and belligerent behaviors.  EEG has been obtained which has been ordered which I think is appropriate.  She continues to have confusion a repeat imaging either with CT scan or MRI, preferably MRI would be useful.  Thanks for this consultation.     [name] A. [name], M.D.     KAD/MEDQ  D:  12/17/2010  T:  12/17/2010  Job:  [id number]

## 2010-12-18 NOTE — Progress Notes (Addendum)
ANTIBIOTIC CONSULT NOTE - follow up  Pharmacy Consult for Vancomycin Indication: pneumonia  Allergies  Allergen Reactions  . Codeine     REACTION: N/V  . Latex    Patient Measurements: Height: 5\' 2"  (157.5 cm) Weight: 211 lb 13.8 oz (96.1 kg) IBW/kg (Calculated) : 50.1   Vital Signs: Temp: 101.4 F (38.6 C) (11/27 0500) Temp src: Axillary (11/27 0500) BP: 106/78 mmHg (11/27 0500) Pulse Rate: 97  (11/27 0600) Intake/Output from previous day: 11/26 0701 - 11/27 0700 In: 1700 [I.V.:1350; IV Piggyback:350] Out: 775 [Urine:775] Intake/Output from this shift:    Labs:  Basename 12/18/10 0453 12/17/10 0501 12/16/10 0447  WBC -- 12.5* --  HGB -- 11.3* --  PLT -- 158 --  LABCREA -- -- --  CREATININE 1.34* 1.36* 1.26*   Estimated Creatinine Clearance: 54.3 ml/min (by C-G formula based on Cr of 1.34).  Basename 12/18/10 0453  VANCOTROUGH 14.5  VANCOPEAK --  VANCORANDOM --  GENTTROUGH --  GENTPEAK --  GENTRANDOM --  TOBRATROUGH --  TOBRAPEAK --  TOBRARND --  AMIKACINPEAK --  AMIKACINTROU --  AMIKACIN --    Microbiology: Recent Results (from the past 720 hour(s))  CULTURE, BLOOD (ROUTINE X 2)     Status: Normal (Preliminary result)   Collection Time   12/13/10  6:45 AM      Component Value Range Status Comment   Specimen Description BLOOD RIGHT ARM   Final    Special Requests BOTTLES DRAWN AEROBIC AND ANAEROBIC 6CC   Final    Culture NO GROWTH 4 DAYS   Final    Report Status PENDING   Incomplete   URINE CULTURE     Status: Normal   Collection Time   12/13/10  6:55 AM      Component Value Range Status Comment   Specimen Description URINE, CATHETERIZED   Final    Special Requests NONE   Final    Setup Time [id number]   Final    Colony Count >=100,000 COLONIES/ML   Final    Culture ESCHERICHIA COLI   Final    Report Status 12/15/2010 FINAL   Final    Organism ID, Bacteria ESCHERICHIA COLI   Final   CULTURE, BLOOD (ROUTINE X 2)     Status: Normal  (Preliminary result)   Collection Time   12/13/10  7:15 AM      Component Value Range Status Comment   Specimen Description BLOOD RIGHT ARM   Final    Special Requests BOTTLES DRAWN AEROBIC AND ANAEROBIC 6CC   Final    Culture NO GROWTH 4 DAYS   Final    Report Status PENDING   Incomplete   MRSA PCR SCREENING     Status: Normal   Collection Time   12/13/10  1:45 PM      Component Value Range Status Comment   MRSA by PCR NEGATIVE  NEGATIVE  Final   CULTURE, BLOOD (ROUTINE X 2)     Status: Normal (Preliminary result)   Collection Time   12/16/10  5:13 PM      Component Value Range Status Comment   Specimen Description BLOOD LEFT ANTECUBITAL   Final    Special Requests BOTTLES DRAWN AEROBIC AND ANAEROBIC 6CC   Final    Culture NO GROWTH 1 DAY   Final    Report Status PENDING   Incomplete   CULTURE, BLOOD (ROUTINE X 2)     Status: Normal (Preliminary result)   Collection Time   12/16/10  5:14 PM      Component Value Range Status Comment   Specimen Description BLOOD PICC LINE   Final    Special Requests     Final    Value: BOTTLES DRAWN AEROBIC AND ANAEROBIC 7CC DRAWN BY RN   Culture NO GROWTH 1 DAY   Final    Report Status PENDING   Incomplete    Medical History: Past Medical History  Diagnosis Date  . Allergy   . Arthritis   . Depression   . GERD (gastroesophageal reflux disease)   . Hyperlipidemia   . Hypertension   . CKD (chronic kidney disease) stage 3, GFR 30-59 ml/min   . Osteoporosis   . Hypothyroidism   . Anxiety   . Chronic pain syndrome   . Tobacco abuse   . Obesity   . Gastroparesis   . Ankylosing spondylitis   . Nephrolithiasis    Medications:  Scheduled:     . acetaminophen      . acetaminophen      . antiseptic oral rinse  15 mL Mouth Rinse Q4H  . azithromycin  500 mg Intravenous Q24H  . haloperidol lactate  2 mg Intravenous TID  . levothyroxine  50 mcg Intravenous Daily  . LORazepam      . LORazepam  2 mg Intravenous Q4H  . nicotine  21 mg  Transdermal Daily  . pantoprazole (PROTONIX) IV  40 mg Intravenous Q24H  . piperacillin-tazobactam (ZOSYN)  IV  3.375 g Intravenous Q8H  . potassium chloride  10 mEq Intravenous Q1 Hr x 2  . potassium chloride      . sodium chloride  10 mL Intracatheter Q12H  . vancomycin  750 mg Intravenous Q12H  . DISCONTD:  HYDROmorphone (DILAUDID) injection  2 mg Intravenous Q4H  . DISCONTD: LORazepam  1 mg Intravenous Q4H   Assessment: Trough barely below target, anticipate some accumulation  Goal of Therapy:  Vancomycin trough level 15-20 mcg/ml  Plan:  Continue Vancomycin 750mg  IV every 12 hours Monitor renal function  Margo Aye, Kalley Nicholl A 12/18/2010,8:15 AM

## 2010-12-18 NOTE — Procedures (Signed)
[name], [name]                 ACCOUNT NO.:  [id number]  MEDICAL RECORD NO.:  [id number]  LOCATION:  IC05                          FACILITY:  [facility]  PHYSICIAN:  Mekhai Venuto A. Gerilyn Pilgrim, M.D. DATE OF BIRTH:  06-06-1960  DATE OF PROCEDURE:  12/17/2010 DATE OF DISCHARGE:                             EEG INTERPRETATION   INDICATION:  The patient is a 50 year old who presents with altered mental status,  confusion, and combativeness.  The study has been done to evaluate for seizures.  MEDICATIONS:  Tylenol, Proventil, Zithromax, Haldol, Synthroid, Ativan, Zofran, Zosyn, Protonix, vancomycin, Celexa, and recently discontinued Dilaudid.  ANALYSIS:  A 16-channel recording using standard 10/20 measurements. This conducted for approximately 20 minutes.  The patient does have a background activity gets as high as 7, most of the recording is superimposed, 3-4 Hz delta activity seen repeatedly throughout the recording.  No clear focal or lateralized slowing.  No epileptiform discharges.  IMPRESSION:  Abnormal recording showing moderate generalized slowing, however, there is no evidence of epileptiform discharges observed.     Jayzen Paver A. Gerilyn Pilgrim, M.D.     KAD/MEDQ  D:  12/18/2010  T:  12/18/2010  Job:  [id number]

## 2010-12-19 DIAGNOSIS — E876 Hypokalemia: Secondary | ICD-10-CM | POA: Diagnosis not present

## 2010-12-19 DIAGNOSIS — G9340 Encephalopathy, unspecified: Secondary | ICD-10-CM

## 2010-12-19 DIAGNOSIS — E870 Hyperosmolality and hypernatremia: Secondary | ICD-10-CM | POA: Diagnosis not present

## 2010-12-19 DIAGNOSIS — K7200 Acute and subacute hepatic failure without coma: Secondary | ICD-10-CM

## 2010-12-19 LAB — GLUCOSE, CAPILLARY: Glucose-Capillary: 144 mg/dL — ABNORMAL HIGH (ref 70–99)

## 2010-12-19 LAB — COMPREHENSIVE METABOLIC PANEL
ALT: 219 U/L — ABNORMAL HIGH (ref 0–35)
AST: 27 U/L (ref 0–37)
Albumin: 2.5 g/dL — ABNORMAL LOW (ref 3.5–5.2)
CO2: 41 mEq/L (ref 19–32)
Chloride: 107 mEq/L (ref 96–112)
Creatinine, Ser: 1.34 mg/dL — ABNORMAL HIGH (ref 0.50–1.10)
Potassium: 2.7 mEq/L — CL (ref 3.5–5.1)
Sodium: 152 mEq/L — ABNORMAL HIGH (ref 135–145)
Total Bilirubin: 1 mg/dL (ref 0.3–1.2)

## 2010-12-19 LAB — BASIC METABOLIC PANEL
CO2: 39 mEq/L — ABNORMAL HIGH (ref 19–32)
Calcium: 8.5 mg/dL (ref 8.4–10.5)
GFR calc non Af Amer: 56 mL/min — ABNORMAL LOW (ref >90)
Potassium: 2.8 mEq/L — ABNORMAL LOW (ref 3.5–5.1)
Sodium: 148 mEq/L — ABNORMAL HIGH (ref 135–145)

## 2010-12-19 LAB — DIFFERENTIAL
Basophils Absolute: 0 10*3/uL (ref 0.0–0.1)
Basophils Relative: 0 % (ref 0–1)
Lymphocytes Relative: 11 % — ABNORMAL LOW (ref 12–46)
Monocytes Absolute: 0.7 10*3/uL (ref 0.1–1.0)
Neutro Abs: 12.5 10*3/uL — ABNORMAL HIGH (ref 1.7–7.7)
Neutrophils Relative %: 84 % — ABNORMAL HIGH (ref 43–77)

## 2010-12-19 LAB — CHOLESTEROL, TOTAL: Cholesterol: 115 mg/dL (ref 0–200)

## 2010-12-19 LAB — MAGNESIUM: Magnesium: 2.3 mg/dL (ref 1.5–2.5)

## 2010-12-19 LAB — CBC
MCHC: 30.4 g/dL (ref 30.0–36.0)
Platelets: 163 10*3/uL (ref 150–400)
RDW: 17.7 % — ABNORMAL HIGH (ref 11.5–15.5)
WBC: 14.9 10*3/uL — ABNORMAL HIGH (ref 4.0–10.5)

## 2010-12-19 LAB — PHOSPHORUS: Phosphorus: 4.7 mg/dL — ABNORMAL HIGH (ref 2.3–4.6)

## 2010-12-19 LAB — TRIGLYCERIDES: Triglycerides: 138 mg/dL

## 2010-12-19 MED ORDER — POTASSIUM CHLORIDE 10 MEQ/100ML IV SOLN
10.0000 meq | INTRAVENOUS | Status: AC
  Administered 2010-12-19 (×3): 10 meq via INTRAVENOUS
  Filled 2010-12-19 (×2): qty 200

## 2010-12-19 MED ORDER — FAT EMULSION 20 % IV EMUL
250.0000 mL | INTRAVENOUS | Status: AC
  Administered 2010-12-19: 250 mL via INTRAVENOUS
  Filled 2010-12-19: qty 250

## 2010-12-19 MED ORDER — INSULIN ASPART 100 UNIT/ML ~~LOC~~ SOLN
0.0000 [IU] | SUBCUTANEOUS | Status: DC
  Administered 2010-12-19: 3 [IU] via SUBCUTANEOUS
  Administered 2010-12-19: 2 [IU] via SUBCUTANEOUS
  Administered 2010-12-19 – 2010-12-20 (×4): 3 [IU] via SUBCUTANEOUS
  Administered 2010-12-20 (×3): 2 [IU] via SUBCUTANEOUS
  Administered 2010-12-21: 3 [IU] via SUBCUTANEOUS
  Administered 2010-12-21 – 2010-12-22 (×6): 2 [IU] via SUBCUTANEOUS
  Administered 2010-12-22 (×2): 3 [IU] via SUBCUTANEOUS
  Administered 2010-12-22: 2 [IU] via SUBCUTANEOUS
  Administered 2010-12-22: 3 [IU] via SUBCUTANEOUS
  Administered 2010-12-23 (×2): 2 [IU] via SUBCUTANEOUS
  Filled 2010-12-19: qty 3

## 2010-12-19 MED ORDER — TRACE MINERALS CR-CU-MN-SE-ZN 10-1000-500-60 MCG/ML IV SOLN
INTRAVENOUS | Status: AC
  Administered 2010-12-19: 18:00:00 via INTRAVENOUS
  Filled 2010-12-19: qty 2000

## 2010-12-19 MED ORDER — POTASSIUM CHLORIDE 2 MEQ/ML IV SOLN
INTRAVENOUS | Status: DC
  Administered 2010-12-19 – 2010-12-20 (×3): via INTRAVENOUS
  Filled 2010-12-19 (×5): qty 1000

## 2010-12-19 MED ORDER — LEVOTHYROXINE SODIUM 100 MCG IV SOLR
75.0000 ug | Freq: Every day | INTRAVENOUS | Status: DC
  Administered 2010-12-20 – 2010-12-22 (×3): 76 ug via INTRAVENOUS
  Filled 2010-12-19 (×5): qty 3.8

## 2010-12-19 NOTE — Progress Notes (Signed)
Subjective: The patient actually answered to her name. She also asked how I was doing. The nursing staff reports that the patient is following small commands for the first time this morning.  Objective: Vital signs in last 24 hours: Filed Vitals:   12/19/10 0400 12/19/10 0500 12/19/10 0600 12/19/10 0824  BP: 103/60 99/56 106/56   Pulse: 81 78 77   Temp: 98.3 F (36.8 C)     TempSrc: Axillary     Resp: 19 23 21    Height:      Weight:  94.2 kg (207 lb 10.8 oz)    SpO2: 92% 94% 92% 100%   Weight change:   Intake/Output Summary (Last 24 hours) at 12/19/10 1015 Last data filed at 12/19/10 0600  Gross per 24 hour  Intake 1474.83 ml  Output   4900 ml  Net -3425.17 ml   Physical Exam:  General: Constant moving in the bed. No acute cardiopulmonary distress. Lungs: No obvious wheezing or crackles. Breathing not particularly labored given her constant movement in bed. Cardiovascular regular rate rhythm without murmurs gallops rubs Abdomen soft nontender nondistended Extremities: No evidence of bilateral lower extremity edema. Neurologic. Movement of all extremities with some confusion and anxiousness appear. The patient answered good morning to her name and then asked how I was doing. Also*she was in pain and she clearly said no.  Lab Results: Basic Metabolic Panel:  Lab 12/19/10 1610 12/18/10 0944 12/18/10 0453  NA 152* -- 156*  K 2.7* -- 3.1*  CL 107 -- 112  CO2 41* -- 37*  GLUCOSE 176* -- 129*  BUN 16 -- 19  CREATININE 1.34* -- 1.34*  CALCIUM 8.2* -- 8.5  MG 2.3 2.4 --  PHOS 4.7* -- --   Liver Function Tests:  Lab 12/19/10 0447 12/18/10 0453  AST 27 47*  ALT 219* 342*  ALKPHOS 83 101  BILITOT 1.0 1.3*  PROT 5.2* 5.5*  ALBUMIN 2.5* 2.7*   No results found for this basename: LIPASE:2,AMYLASE:2 in the last 168 hours  Lab 12/15/10 1610 12/13/10 0919  AMMONIA 32 76*   CBC:  Lab 12/19/10 0447 12/17/10 0501 12/13/10 0630  WBC 14.9* 12.5* --  NEUTROABS 12.5* --  9.9*  HGB 11.4* 11.3* --  HCT 37.5 37.0 --  MCV 107.4* 106.9* --  PLT 163 158 --   Cardiac Enzymes:  Lab 12/14/10 0759 12/13/10 2346 12/13/10 1551  CKTOTAL 475* 679* 575*  CKMB 3.8 4.2* 4.4*  CKMBINDEX -- -- --  TROPONINI <0.30 <0.30 <0.30   BNP:  Lab 12/18/10 0942 12/14/10 0800  POCBNP 6841.0* 1528.0*   D-Dimer: No results found for this basename: DDIMER:2 in the last 168 hours CBG: No results found for this basename: GLUCAP:6 in the last 168 hours Hemoglobin A1C: No results found for this basename: HGBA1C in the last 168 hours Fasting Lipid Panel:  Lab 12/19/10 0447  CHOL 115  HDL --  LDLCALC --  TRIG 138  CHOLHDL --  LDLDIRECT --   Thyroid Function Tests:  Lab 12/14/10 0800 12/13/10 1206  TSH -- 0.151*  T4TOTAL -- --  FREET4 -- 0.88  T3FREE 1.6* --  THYROIDAB -- --   Coagulation:  Lab 12/17/10 0501 12/16/10 0447 12/15/10 0524 12/14/10 0949  LABPROT 18.0* 17.4* 18.4* 18.2*  INR 1.46 1.40 1.50* 1.48   Anemia Panel:  Lab 12/13/10 1206  VITAMINB12 >2000*  FOLATE --  FERRITIN 4434*  TIBC NOT CALC  IRON 283*  RETICCTPCT --    Urine Legionella negative  Repeat urinalysis Results for LAVENE, PENAGOS (MRN [id number]) as of 12/17/2010 09:11  Ref. Range 12/16/2010 18:37  Color, Urine Latest Range: YELLOW  YELLOW  Appearance Latest Range: CLEAR  CLEAR  Specific Gravity, Urine Latest Range: 1.005-1.030  1.020  pH Latest Range: 5.0-8.0  6.0  Glucose, UA Latest Range: NEGATIVE mg/dL NEGATIVE  Bilirubin Urine Latest Range: NEGATIVE  SMALL (A)  Ketones, ur Latest Range: NEGATIVE mg/dL TRACE (A)  Protein Latest Range: NEGATIVE mg/dL NEGATIVE  Urobilinogen, UA Latest Range: 0.0-1.0 mg/dL 1.0  Nitrite Latest Range: NEGATIVE  NEGATIVE  Leukocytes, UA Latest Range: NEGATIVE  NEGATIVE  WBC, UA Latest Range: <3 WBC/hpf 3-6  RBC / HPF Latest Range: <3 RBC/hpf 3-6  Bacteria, UA Latest Range: RARE  RARE  Crystals Latest Range: NEGATIVE  URIC ACID CRYSTALS (A)    Micro Results: Recent Results (from the past 240 hour(s))  CULTURE, BLOOD (ROUTINE X 2)     Status: Normal   Collection Time   12/13/10  6:45 AM      Component Value Range Status Comment   Specimen Description BLOOD RIGHT ARM   Final    Special Requests BOTTLES DRAWN AEROBIC AND ANAEROBIC 6CC   Final    Culture NO GROWTH 5 DAYS   Final    Report Status 12/18/2010 FINAL   Final   URINE CULTURE     Status: Normal   Collection Time   12/13/10  6:55 AM      Component Value Range Status Comment   Specimen Description URINE, CATHETERIZED   Final    Special Requests NONE   Final    Setup Time [id number]   Final    Colony Count >=100,000 COLONIES/ML   Final    Culture ESCHERICHIA COLI   Final    Report Status 12/15/2010 FINAL   Final    Organism ID, Bacteria ESCHERICHIA COLI   Final   CULTURE, BLOOD (ROUTINE X 2)     Status: Normal   Collection Time   12/13/10  7:15 AM      Component Value Range Status Comment   Specimen Description BLOOD RIGHT ARM   Final    Special Requests BOTTLES DRAWN AEROBIC AND ANAEROBIC 6CC   Final    Culture NO GROWTH 5 DAYS   Final    Report Status 12/18/2010 FINAL   Final   MRSA PCR SCREENING     Status: Normal   Collection Time   12/13/10  1:45 PM      Component Value Range Status Comment   MRSA by PCR NEGATIVE  NEGATIVE  Final   CULTURE, BLOOD (ROUTINE X 2)     Status: Normal (Preliminary result)   Collection Time   12/16/10  5:13 PM      Component Value Range Status Comment   Specimen Description BLOOD LEFT ANTECUBITAL   Final    Special Requests BOTTLES DRAWN AEROBIC AND ANAEROBIC 6CC   Final    Culture NO GROWTH 2 DAYS   Final    Report Status PENDING   Incomplete   CULTURE, BLOOD (ROUTINE X 2)     Status: Normal (Preliminary result)   Collection Time   12/16/10  5:14 PM      Component Value Range Status Comment   Specimen Description BLOOD PICC LINE DRAWN BY RN   Final    Special Requests BOTTLES DRAWN AEROBIC AND ANAEROBIC 7CC   Final     Culture NO GROWTH 2 DAYS   Final  Report Status PENDING   Incomplete   URINE CULTURE     Status: Normal   Collection Time   12/16/10  6:37 PM      Component Value Range Status Comment   Specimen Description URINE, CATHETERIZED   Final    Special Requests NONE   Final    Setup Time [id number]   Final    Colony Count NO GROWTH   Final    Culture NO GROWTH   Final    Report Status 12/18/2010 FINAL   Final    Studies/Results: US Venous Img Lower Bilateral  12/18/2010  *RADIOLOGY REPORT*  Clinical Data: Fever.  Breathing problems.  BILATERAL UPPER EXTREMITY VENOUS DUPLEX ULTRASOUND  Technique:  Gray-scale sonography with graded compression, as well as color Doppler and duplex ultrasound were performed to evaluate the upper extremity deep venous system of both upper extremities from the level of the subclavian vein and including the jugular, axillary, basilic and upper cephalic vein.  Spectral Doppler was utilized to evaluate flow at rest and with distal augmentation maneuvers.  Comparison:  None.  Findings: From the level of the common femoral vein to the popliteal vein there is adequate color flow, compression and augmentation without evidence of a deep venous thrombosis on either side.  Calf veins were not able to be investigated secondary to poor patient cooperation.  IMPRESSION: No evidence of deep venous thrombosis on either side.  Original Report Authenticated By: Fuller Canada, M.D.   Dg Chest Port 1 View  12/18/2010  *RADIOLOGY REPORT*  Clinical Data: Fever.  Respiratory distress.  PORTABLE CHEST - 1 VIEW  Comparison: 12/15/2010.  Findings: Right lower lobe consolidation.  This has progressed since prior exam.  Cardiomegaly.  Pulmonary vascular congestion.  Right central line tip proximal superior vena cava level.  No gross pneumothorax.  IMPRESSION: Consolidation right lower lobe has progressed since prior exam.  Cardiomegaly and pulmonary vascular congestion.  Original Report  Authenticated By: Fuller Canada, M.D.   Scheduled Meds:    . acetaminophen      . albuterol  2.5 mg Nebulization QID  . antiseptic oral rinse  15 mL Mouth Rinse Q4H  . azithromycin  500 mg Intravenous Q24H  . furosemide  40 mg Intravenous Once  . haloperidol lactate  2 mg Intravenous TID  . insulin aspart  0-15 Units Subcutaneous Q4H  . ipratropium  0.5 mg Nebulization QID  . levothyroxine  50 mcg Intravenous Daily  . LORazepam  2 mg Intravenous Q4H  . nicotine  21 mg Transdermal Daily  . pantoprazole (PROTONIX) IV  40 mg Intravenous Q24H  . piperacillin-tazobactam (ZOSYN)  IV  3.375 g Intravenous Q8H  . potassium chloride  10 mEq Intravenous Q1 Hr x 4  . potassium chloride  10 mEq Intravenous Q1 Hr x 4  . sodium chloride  10 mL Intracatheter Q12H  . vancomycin  750 mg Intravenous Q12H   Continuous Infusions:    . dextrose 5 % with kcl    . dextrose 5 % with KCl 20 mEq / L 20 mEq (12/18/10 1615)  . TPN (CLINIMIX) +/- additives     And  . fat emulsion    . TPN (CLINIMIX) +/- additives 50 mL/hr at 12/18/10 1900  . DISCONTD: dextrose 5 % with KCl 20 mEq / L 20 mEq (12/18/10 1600)  . DISCONTD: dextrose 5 % with KCl 20 mEq / L 20 mEq (12/19/10 0600)  . DISCONTD: fat emulsion     PRN  Meds:.acetaminophen, albuterol, LORazepam, ondansetron (ZOFRAN) IV, sodium chloride  Assessment/Plan:   *Encephalopathy continues, but for the first time, she answered to her name and did ask an appropriate question. I suspect this is multifactorial. Suspect withdrawal from benzodiazepines, opiate analgesics. Also from toxic metabolic encephalopathy. Doubt significant contribution of increase Celexa dose at this point, she because she is too far out from last dose. Of concern, is prolonged hypoxia prior to admission. Ammonia levels are now normal, so this should is not contributing at this point. Rule out subclinical seizures. EEG pending. If patient continues to spike fevers, she may need lumbar  puncture, though she would be at risk to do to her severe agitation, and obesity. She would require significantly more sedation. If needed, she has responded fairly well to the Geodon 20 mg IM. Continues to require restraints because she has pulling out lines and trying to get out of bed. She is too agitated to undergo MRI of the brain. She was almost a week without any nutrition, so TPN was started 12/18/2010.Marland Kitchen She is still too confused to take anything by mouth.. Also, nasogastric tube feeds would be difficult and she would be at risk for aspiration do to her agitation and pulling at lines. I will place her on TPN for now. Neurologist, Dr. Gerilyn Pilgrim is following.  Fever yesterday despite broad-spectrum antibiotics (Zosyn, azithromycin, and Flagyl). Repeat cultures have been negative. Her repeat chest x-ray revealed right lower lobe pneumonia and Doppler of the legs were negative for DVT. Defer lumbar puncture to Dr. Gerilyn Pilgrim. If workup continues to be negative, could be drug fever.    Bilateral pneumonia with narrowing of right lower lobe bronchus: See above. Will need a repeat CAT scan in 3-6 months after resolution of her acute illness.  Ischemic hepatopathy: Liver enzymes INR nearly back to normal. Ammonia level normal. No further workup needed. Appreciate GI's input.   Acute respiratory failure with hypercapnia and hypoxia: Patient has evidence of bilateral pneumonia. I suspect she had a pulmonary infection prior to admission that did not show up on chest x-rays and that this may have been community-acquired or aspiration related. Urinary Legionella, strep pneumonia antigen is negative. Influenza PCR negative.  Also has most likely sleep apnea which is previously been undiagnosed as far as I can tell. echocardiogram shows good ejection fraction. She was given 40 mg of Lasix on November 27 for an elevated pro BNP and diuresis over 4 L. Added scheduled bronchodilators. Her repeat ABG on November 27  revealed ongoing hypoxia and hypercapnia, but overall, somewhat better. She has been off BiPAP for a few days, but continued to require nonrebreather mask    HYPERTENSION, BENIGN SYSTEMIC: Medications held   Escherichia coli UTI (lower urinary tract infection): Pansensitive except for ampicillin. Zosyn will cover. Repeat UA improved. Repeat urine culture negative to date.   Hypernatremia, being treated with hypotonic D5W.  Hypokalemia: We'll replete IV and in hypotonic IV fluids. Magnesium level normal.  HYPERCHOLESTEROLEMIA: Statins stopped   OBESITY, NOS   ANXIETY: On chronic benzodiazepines. Will change Ativan from scheduled to when necessary.  Chronic opiates and benzodiazepines. Dilaudid was stopped by Dr. Gerilyn Pilgrim.    Ankylosing spondylitis   CKD (chronic kidney disease), stage III   Macrocytosis. Vitamin B 12 and folate levels are actually elevated.  Hypothyroidism: Continue current and repeat thyroid function tests as an outpatient. She is on IV level thyroxine. Will increase it closer to her by mouth dosing at 75 mcg daily.  Critical care time  40 minutes.   LOS: 6 days   Luverne Zerkle 12/19/2010, 10:15 AM

## 2010-12-19 NOTE — Progress Notes (Signed)
CRITICAL VALUE ALERT  Critical value received:  Potasium (2.7) and CO2 (41)  Date of notification: 12/19/2010  Time of notification:  0640  Critical value read back:yes  Nurse who received alert:  RN, Peri Maris  MD notified (1st page): Dr. Rito Ehrlich  Time of first page:  [phone]  MD notified (2nd page):  Time of second page:  Responding MD:    Time MD responded:

## 2010-12-19 NOTE — Consult Note (Signed)
PARENTERAL NUTRITION CONSULT NOTE   Pharmacy Consult for TPN Indication: unable to take food orally, too agitated for TF placement, encephalopathy  Allergies  Allergen Reactions  . Codeine     REACTION: N/V  . Latex    Patient Measurements: Height: 5\' 2"  (157.5 cm) Weight: 207 lb 10.8 oz (94.2 kg) IBW/kg (Calculated) : 50.1   Vital Signs: Temp: 98.3 F (36.8 C) (11/28 0400) Temp src: Axillary (11/28 0400) BP: 106/56 mmHg (11/28 0600) Pulse Rate: 77  (11/28 0600) Intake/Output from previous day: 11/27 0701 - 11/28 0700 In: 1474.8 [I.V.:535.8; IV Piggyback:904; TPN:35] Out: 4900 [Urine:4900] Intake/Output from this shift:   Labs:  [facility] 12/19/10 0447 12/17/10 0501  WBC 14.9* 12.5*  HGB 11.4* 11.3*  HCT 37.5 37.0  PLT 163 158  APTT -- --  INR -- 1.46    Basename 12/19/10 0447 12/18/10 0944 12/18/10 0453 12/17/10 0501  NA 152* -- 156* 154*  K 2.7* -- 3.1* 3.4*  CL 107 -- 112 109  CO2 41* -- 37* 40*  GLUCOSE 176* -- 129* 110*  BUN 16 -- 19 23  CREATININE 1.34* -- 1.34* 1.36*  LABCREA -- -- -- --  CREAT24HRUR -- -- -- --  CALCIUM 8.2* -- 8.5 8.3*  MG 2.3 2.4 -- --  PHOS 4.7* -- -- --  PROT 5.2* -- 5.5* 5.4*  ALBUMIN 2.5* -- 2.7* 2.8*  AST 27 -- 47* 65*  ALT 219* -- 342* 518*  ALKPHOS 83 -- 101 118*  BILITOT 1.0 -- 1.3* 1.4*  BILIDIR -- -- -- --  IBILI -- -- -- --  PREALBUMIN -- -- -- --  CHOLHDL -- -- -- --  CHOL 115 -- -- --   Estimated Creatinine Clearance: 53.7 ml/min (by C-G formula based on Cr of 1.34).   No results found for this basename: GLUCAP:3 in the last 72 hours  Medical History: Past Medical History  Diagnosis Date  . Allergy   . Arthritis   . Depression   . GERD (gastroesophageal reflux disease)   . Hyperlipidemia   . Hypertension   . CKD (chronic kidney disease) stage 3, GFR 30-59 ml/min   . Osteoporosis   . Hypothyroidism   . Anxiety   . Chronic pain syndrome   . Tobacco abuse   . Obesity   . Gastroparesis   .  Ankylosing spondylitis   . Nephrolithiasis    Medications:  Scheduled:     . acetaminophen      . albuterol  2.5 mg Nebulization QID  . antiseptic oral rinse  15 mL Mouth Rinse Q4H  . azithromycin  500 mg Intravenous Q24H  . furosemide  40 mg Intravenous Once  . haloperidol lactate  2 mg Intravenous TID  . insulin aspart  0-15 Units Subcutaneous Q4H  . ipratropium  0.5 mg Nebulization QID  . levothyroxine  50 mcg Intravenous Daily  . LORazepam  2 mg Intravenous Q4H  . nicotine  21 mg Transdermal Daily  . pantoprazole (PROTONIX) IV  40 mg Intravenous Q24H  . piperacillin-tazobactam (ZOSYN)  IV  3.375 g Intravenous Q8H  . potassium chloride  10 mEq Intravenous Q1 Hr x 4  . potassium chloride  10 mEq Intravenous Q1 Hr x 4  . sodium chloride  10 mL Intracatheter Q12H  . vancomycin  750 mg Intravenous Q12H   Insulin Requirements in the past 24 hours:  None, initiating SSI due to TPN induced hyperglycemia  Current Nutrition:  TPN  Assessment: Hypernatremia (improving slowly)  Hypokalemia (K+ being replenished) Agitation pna  Nutritional Goals:  ~1800 kCal, ~115-120 grams of protein per day  Plan:  continue TPN at 45ml/hr today & until sugars stable Replace K+ as needed Add SSI to control sugars Increase K+ in IVF to per Liter Lipids and MVI & trace elements MWF Labs per protocol Monitor renal fxn, glucose tolerance, fluid status, lytes  Eisa Conaway A 12/19/2010,10:19 AM

## 2010-12-19 NOTE — Progress Notes (Addendum)
Subjective: In restraints. Moaning, uttering incoherent words.   Objective: Vital signs in last 24 hours: Temp:  [98.3 F (36.8 C)-102 F (38.9 C)] 98.3 F (36.8 C) (11/28 0400) Pulse Rate:  [72-109] 77  (11/28 0600) Resp:  [19-42] 21  (11/28 0600) BP: (99-153)/(56-98) 106/56 mmHg (11/28 0600) SpO2:  [87 %-96 %] 92 % (11/28 0600) FiO2 (%):  [60 %-100 %] 100 % (11/27 2132) Weight:  [207 lb 10.8 oz (94.2 kg)] 207 lb 10.8 oz (94.2 kg) (11/28 0500) Last BM Date: 12/15/10 General:   Incoherent, restrained Head:  Normocephalic and atraumatic. Eyes:  No icterus, sclera clear. Conjuctiva pink.  Heart:  S1, S2 present, no murmurs noted.  Lungs: coarse, tachypnic, use of accessory muscles, O2 via non rebreather Abdomen:  Bowel sounds present, soft, obese, non-distended. No HSM or hernias noted. No rebound or guarding. No masses appreciated  Pulses:  Normal pulses noted. Neurologic: moaning, restrained.   Intake/Output from previous day: 11/27 0701 - 11/28 0700 In: 1474.8 [I.V.:535.8; IV Piggyback:904; TPN:35] Out: 4900 [Urine:4900] Intake/Output this shift:    Lab Results:  Basename 12/19/10 0447 12/17/10 0501  WBC 14.9* 12.5*  HGB 11.4* 11.3*  HCT 37.5 37.0  PLT 163 158   BMET  Basename 12/19/10 0447 12/18/10 0453 12/17/10 0501  NA 152* 156* 154*  K 2.7* 3.1* 3.4*  CL 107 112 109  CO2 41* 37* 40*  GLUCOSE 176* 129* 110*  BUN 16 19 23   CREATININE 1.34* 1.34* 1.36*  CALCIUM 8.2* 8.5 8.3*   LFT  Basename 12/19/10 0447 12/18/10 0453 12/17/10 0501  PROT 5.2* 5.5* 5.4*  ALBUMIN 2.5* 2.7* 2.8*  AST 27 47* 65*  ALT 219* 342* 518*  ALKPHOS 83 101 118*  BILITOT 1.0 1.3* 1.4*  BILIDIR -- -- --  IBILI -- -- --   PT/INR  Basename 12/17/10 0501  LABPROT 18.0*  INR 1.46    Studies/Results: US Venous Img Lower Bilateral  12/18/2010  *RADIOLOGY REPORT*  Clinical Data: Fever.  Breathing problems.  BILATERAL UPPER EXTREMITY VENOUS DUPLEX ULTRASOUND  Technique:   Gray-scale sonography with graded compression, as well as color Doppler and duplex ultrasound were performed to evaluate the upper extremity deep venous system of both upper extremities from the level of the subclavian vein and including the jugular, axillary, basilic and upper cephalic vein.  Spectral Doppler was utilized to evaluate flow at rest and with distal augmentation maneuvers.  Comparison:  None.  Findings: From the level of the common femoral vein to the popliteal vein there is adequate color flow, compression and augmentation without evidence of a deep venous thrombosis on either side.  Calf veins were not able to be investigated secondary to poor patient cooperation.  IMPRESSION: No evidence of deep venous thrombosis on either side.  Original Report Authenticated By: Fuller Canada, M.D.   Dg Chest Port 1 View  12/18/2010  *RADIOLOGY REPORT*  Clinical Data: Fever.  Respiratory distress.  PORTABLE CHEST - 1 VIEW  Comparison: 12/15/2010.  Findings: Right lower lobe consolidation.  This has progressed since prior exam.  Cardiomegaly.  Pulmonary vascular congestion.  Right central line tip proximal superior vena cava level.  No gross pneumothorax.  IMPRESSION: Consolidation right lower lobe has progressed since prior exam.  Cardiomegaly and pulmonary vascular congestion.  Original Report Authenticated By: Fuller Canada, M.D.    Assessment: 50 year old female with shock liver, LFTs significantly improved, INR normal, Ammonia normal. AMA result back and negative. Encephalopathy likely multifactorial. Neurology on board.  Plan: Follow peripherally CMP standing.  Neurology on board    LOS: 6 days   Gerrit Halls  12/19/2010, 7:50 AM    She is definitely less agitated and more responsive today by my assessment as compared when I last saw her a couple of days ago. We'll sign off for now. Please call if any GI issues arise.

## 2010-12-19 NOTE — Progress Notes (Signed)
NAMESINDHU, NGUYEN                 ACCOUNT NO.:  [id number]  MEDICAL RECORD NO.:  [id number]  LOCATION:  IC05                          FACILITY:  [facility]  PHYSICIAN:  Cas Tracz A. Gerilyn Pilgrim, M.D. DATE OF BIRTH:  1960/03/31  DATE OF PROCEDURE:  12/19/2010 DATE OF DISCHARGE:                                PROGRESS NOTE   SUBJECTIVE:  The patient continues to be agitated at this time as she was previously.  OBJECTIVE:  VITAL SIGNS:  She continues to spike a temperature 101.4, T- max overnight.  Latest temperature is also 101.4, heart rate 97, blood pressure 106/78. GENERAL:  The patient again is lying in bed.  She does have a Venti mask on.  She is not very cooperative to evaluation. HEENT:  Neck is supple.  Head is normocephalic. ABDOMEN:  Obese but soft. EXTREMITIES:  Mild edema. MENTATION:  Again, she lays in bed with eyes closed, only partial eye- opening to deep painful stimuli.  She moans and groans.  She does not follow commands.  She really does not speak or has significant verbal output other than some few moans and groans. CRANIAL NERVES:  Pupils are reactive.  Corneal reflexes are intact. Oculocephalic reflexes are intact.  She was vigorous during the physical examination including evaluation of cranial nerve.  Facial muscle strength is symmetric.  Motor examination again shows good movement, vigorous movements, and is on restraints.  Tone and bulk are normal. Coordination shows no dysmetria.  There are no tremors.  No parkinsonism.  LABORATORY EVALUATION:  Sodium high 156, potassium 3.1, chloride 112, CO2 37, glucose 129, BUN 19, creatinine 1.3, calcium 8.5.  CURRENT MEDICATIONS:  Reviewed.  She is on: 1. Azithromycin. 2. Haldol 2.5 t.i.d. 3. Levothyroxine. 4. Ativan 2 mg p.r.n. 5. Nicotine patch. 6. Protonix. 7. Zosyn. 8. Vancomycin.  IMAGING:  EEG was reviewed and shows moderate slowing, no epileptiform discharges.  ASSESSMENT AND PLAN:  Again, I think the patient  most likely has a multifactorial toxic metabolic encephalopathy.  She continues to spike a temperature which is concerning.  It is unclear if this is due to antibiotic or if she is having untreated infection.  One option maybe to stopped antibiotics for 24-48 hours as we re-cultured the patient, including do a spinal tap if she spiked.  We also may consider repeating her imaging with a head CT scan.  We will continue to follow the patient neurologically.     Maurice Ramseur A. Gerilyn Pilgrim, M.D.     KAD/MEDQ  D:  12/19/2010  T:  12/19/2010  Job:  [id number]

## 2010-12-19 NOTE — Progress Notes (Signed)
UR Chart Review Completed  

## 2010-12-19 NOTE — Progress Notes (Signed)
Subjective: Patient reports   Objective: Vital signs in last 24 hours: Temp:  [98.3 F (36.8 C)-102 F (38.9 C)] 98.3 F (36.8 C) (11/28 0400) Pulse Rate:  [72-109] 77  (11/28 0600) Resp:  [19-42] 21  (11/28 0600) BP: (99-153)/(56-98) 106/56 mmHg (11/28 0600) SpO2:  [87 %-100 %] 100 % (11/28 0824) FiO2 (%):  [60 %-100 %] 60 % (11/28 0824) Weight:  [94.2 kg (207 lb 10.8 oz)] 207 lb 10.8 oz (94.2 kg) (11/28 0500)  Intake/Output from previous day: 11/27 0701 - 11/28 0700 In: 1474.8 [I.V.:535.8; IV Piggyback:904; TPN:35] Out: 4900 [Urine:4900] Intake/Output this shift:      Lab Results:  Basename 12/19/10 0447 12/17/10 0501  WBC 14.9* 12.5*  HGB 11.4* 11.3*  HCT 37.5 37.0  PLT 163 158   BMET  Basename 12/19/10 0447 12/18/10 0453  NA 152* 156*  K 2.7* 3.1*  CL 107 112  CO2 41* 37*  GLUCOSE 176* 129*  BUN 16 19  CREATININE 1.34* 1.34*  CALCIUM 8.2* 8.5    Studies/Results: US Venous Img Lower Bilateral  12/18/2010  *RADIOLOGY REPORT*  Clinical Data: Fever.  Breathing problems.  BILATERAL UPPER EXTREMITY VENOUS DUPLEX ULTRASOUND  Technique:  Gray-scale sonography with graded compression, as well as color Doppler and duplex ultrasound were performed to evaluate the upper extremity deep venous system of both upper extremities from the level of the subclavian vein and including the jugular, axillary, basilic and upper cephalic vein.  Spectral Doppler was utilized to evaluate flow at rest and with distal augmentation maneuvers.  Comparison:  None.  Findings: From the level of the common femoral vein to the popliteal vein there is adequate color flow, compression and augmentation without evidence of a deep venous thrombosis on either side.  Calf veins were not able to be investigated secondary to poor patient cooperation.  IMPRESSION: No evidence of deep venous thrombosis on either side.  Original Report Authenticated By: Fuller Canada, M.D.   Dg Chest Port 1  View  12/18/2010  *RADIOLOGY REPORT*  Clinical Data: Fever.  Respiratory distress.  PORTABLE CHEST - 1 VIEW  Comparison: 12/15/2010.  Findings: Right lower lobe consolidation.  This has progressed since prior exam.  Cardiomegaly.  Pulmonary vascular congestion.  Right central line tip proximal superior vena cava level.  No gross pneumothorax.  IMPRESSION: Consolidation right lower lobe has progressed since prior exam.  Cardiomegaly and pulmonary vascular congestion.  Original Report Authenticated By: Fuller Canada, M.D.   Scheduled Meds:   . acetaminophen      . albuterol  2.5 mg Nebulization QID  . antiseptic oral rinse  15 mL Mouth Rinse Q4H  . azithromycin  500 mg Intravenous Q24H  . furosemide  40 mg Intravenous Once  . haloperidol lactate  2 mg Intravenous TID  . ipratropium  0.5 mg Nebulization QID  . levothyroxine  50 mcg Intravenous Daily  . LORazepam  2 mg Intravenous Q4H  . nicotine  21 mg Transdermal Daily  . pantoprazole (PROTONIX) IV  40 mg Intravenous Q24H  . piperacillin-tazobactam (ZOSYN)  IV  3.375 g Intravenous Q8H  . potassium chloride  10 mEq Intravenous Q1 Hr x 4  . potassium chloride  10 mEq Intravenous Q1 Hr x 4  . sodium chloride  10 mL Intracatheter Q12H  . vancomycin  750 mg Intravenous Q12H   Continuous Infusions:   . dextrose 5 % with kcl    . dextrose 5 % with KCl 20 mEq / L 20 mEq (  12/18/10 1615)  . TPN (CLINIMIX) +/- additives 50 mL/hr at 12/18/10 1900  . DISCONTD: dextrose 5 % with KCl 20 mEq / L 20 mEq (12/18/10 1600)  . DISCONTD: dextrose 5 % with KCl 20 mEq / L 20 mEq (12/19/10 0600)  . DISCONTD: fat emulsion     PRN Meds:.acetaminophen, albuterol, LORazepam, ondansetron (ZOFRAN) IV, sodium chloride  Assessment/Plan: See dictation  Jatasia Gundrum 12/19/2010, 9:52 AM

## 2010-12-20 DIAGNOSIS — R739 Hyperglycemia, unspecified: Secondary | ICD-10-CM | POA: Diagnosis not present

## 2010-12-20 LAB — COMPREHENSIVE METABOLIC PANEL
ALT: 138 U/L — ABNORMAL HIGH (ref 0–35)
AST: 23 U/L (ref 0–37)
CO2: 35 mEq/L — ABNORMAL HIGH (ref 19–32)
Chloride: 105 mEq/L (ref 96–112)
GFR calc Af Amer: 69 mL/min — ABNORMAL LOW (ref >90)
GFR calc non Af Amer: 59 mL/min — ABNORMAL LOW (ref >90)
Glucose, Bld: 164 mg/dL — ABNORMAL HIGH (ref 70–99)
Sodium: 144 mEq/L (ref 135–145)
Total Bilirubin: 1 mg/dL (ref 0.3–1.2)

## 2010-12-20 LAB — GLUCOSE, CAPILLARY
Glucose-Capillary: 124 mg/dL — ABNORMAL HIGH (ref 70–99)
Glucose-Capillary: 137 mg/dL — ABNORMAL HIGH (ref 70–99)
Glucose-Capillary: 153 mg/dL — ABNORMAL HIGH (ref 70–99)
Glucose-Capillary: 163 mg/dL — ABNORMAL HIGH (ref 70–99)
Glucose-Capillary: 173 mg/dL — ABNORMAL HIGH (ref 70–99)

## 2010-12-20 LAB — BASIC METABOLIC PANEL
Calcium: 8.7 mg/dL (ref 8.4–10.5)
Creatinine, Ser: 1.1 mg/dL (ref 0.50–1.10)
GFR calc Af Amer: 67 mL/min — ABNORMAL LOW (ref >90)
GFR calc non Af Amer: 58 mL/min — ABNORMAL LOW (ref >90)
Sodium: 143 mEq/L (ref 135–145)

## 2010-12-20 MED ORDER — POTASSIUM CHLORIDE 10 MEQ/50ML IV SOLN: 10.0000 meq | INTRAVENOUS | Status: DC

## 2010-12-20 MED ORDER — POTASSIUM CHLORIDE 10 MEQ/100ML IV SOLN
INTRAVENOUS | Status: AC
  Administered 2010-12-20: 10 meq
  Filled 2010-12-20: qty 200

## 2010-12-20 MED ORDER — POTASSIUM CHLORIDE 10 MEQ/100ML IV SOLN
10.0000 meq | INTRAVENOUS | Status: AC
  Administered 2010-12-20 (×4): 10 meq via INTRAVENOUS
  Filled 2010-12-20 (×3): qty 100

## 2010-12-20 MED ORDER — POTASSIUM CHLORIDE 10 MEQ/100ML IV SOLN
10.0000 meq | INTRAVENOUS | Status: AC
  Administered 2010-12-20 (×3): 10 meq via INTRAVENOUS
  Filled 2010-12-20 (×4): qty 100

## 2010-12-20 MED ORDER — CLINIMIX E/DEXTROSE (5/15) 5 % IV SOLN
INTRAVENOUS | Status: AC
  Administered 2010-12-20: 18:00:00 via INTRAVENOUS
  Filled 2010-12-20: qty 2000

## 2010-12-20 MED ORDER — INSULIN GLARGINE 100 UNIT/ML ~~LOC~~ SOLN
12.0000 [IU] | Freq: Every day | SUBCUTANEOUS | Status: DC
  Administered 2010-12-20 – 2010-12-22 (×3): 12 [IU] via SUBCUTANEOUS
  Filled 2010-12-20: qty 3

## 2010-12-20 NOTE — Consult Note (Addendum)
PARENTERAL NUTRITION CONSULT NOTE   Pharmacy Consult for TPN Indication: unable to take food orally, too agitated for TF placement, encephalopathy  Allergies  Allergen Reactions  . Codeine     REACTION: N/V  . Latex    Patient Measurements: Height: 5\' 2"  (157.5 cm) Weight: 210 lb 5.1 oz (95.4 kg) IBW/kg (Calculated) : 50.1   Vital Signs: Temp: 98 F (36.7 C) (11/29 0404) Temp src: Axillary (11/29 0404) BP: 137/70 mmHg (11/29 0600) Pulse Rate: 83  (11/29 0600) Intake/Output from previous day: 11/28 0701 - 11/29 0700 In: 3428.7 [I.V.:1460; IV Piggyback:700; TPN:1268.7] Out: 552 [Urine:550; Stool:2] Intake/Output from this shift:   Labs:  [facility] 12/19/10 0447  WBC 14.9*  HGB 11.4*  HCT 37.5  PLT 163  APTT --  INR --    Basename 12/20/10 0410 12/19/10 1138 12/19/10 0447 12/18/10 0944 12/18/10 0453  NA 144 148* 152* -- --  K 3.0* 2.8* 2.7* -- --  CL 105 106 107 -- --  CO2 35* 39* 41* -- --  GLUCOSE 164* 192* 176* -- --  BUN 13 16 16  -- --  CREATININE 1.07 1.12* 1.34* -- --  LABCREA -- -- -- -- --  CREAT24HRUR -- -- -- -- --  CALCIUM 8.5 8.5 8.2* -- --  MG 1.9 -- 2.3 2.4 --  PHOS 2.5 -- 4.7* -- --  PROT 5.2* -- 5.2* -- 5.5*  ALBUMIN 2.4* -- 2.5* -- 2.7*  AST 23 -- 27 -- 47*  ALT 138* -- 219* -- 342*  ALKPHOS 84 -- 83 -- 101  BILITOT 1.0 -- 1.0 -- 1.3*  BILIDIR -- -- -- -- --  IBILI -- -- -- -- --  PREALBUMIN -- -- 8.0* -- --  CHOLHDL -- -- -- -- --  CHOL -- -- 115 -- --   Estimated Creatinine Clearance: 67.7 ml/min (by C-G formula based on Cr of 1.07).    Basename 12/20/10 0403 12/20/10 0051 12/19/10 2026  GLUCAP 153* 173* 144*    Medical History: Past Medical History  Diagnosis Date  . Allergy   . Arthritis   . Depression   . GERD (gastroesophageal reflux disease)   . Hyperlipidemia   . Hypertension   . CKD (chronic kidney disease) stage 3, GFR 30-59 ml/min   . Osteoporosis   . Hypothyroidism   . Anxiety   . Chronic pain syndrome   .  Tobacco abuse   . Obesity   . Gastroparesis   . Ankylosing spondylitis   . Nephrolithiasis    Medications:  Scheduled:     . albuterol  2.5 mg Nebulization QID  . antiseptic oral rinse  15 mL Mouth Rinse Q4H  . azithromycin  500 mg Intravenous Q24H  . haloperidol lactate  2 mg Intravenous TID  . insulin aspart  0-15 Units Subcutaneous Q4H  . ipratropium  0.5 mg Nebulization QID  . levothyroxine  76 mcg Intravenous Daily  . nicotine  21 mg Transdermal Daily  . pantoprazole (PROTONIX) IV  40 mg Intravenous Q24H  . piperacillin-tazobactam (ZOSYN)  IV  3.375 g Intravenous Q8H  . potassium chloride  10 mEq Intravenous Q1 Hr x 4  . potassium chloride  10 mEq Intravenous Q1 Hr x 4  . sodium chloride  10 mL Intracatheter Q12H  . vancomycin  750 mg Intravenous Q12H  . DISCONTD: levothyroxine  50 mcg Intravenous Daily  . DISCONTD: LORazepam  2 mg Intravenous Q4H   Insulin Requirements in the past 24 hours:   12  units SSI from 12-19-10 at 6pm to this morning.   Current Nutrition:  TPN  Assessment: Hypernatremia resolved Hypokalemia (K+ being replenished) CBG's improving.      Nutritional Goals:  ~1800 kCal, ~115-120 grams of protein per day  Plan:  TPN at 65 ml/hr today. Replace K+ as needed. BMP at 4pm today Continue SSI to control sugars  KCL runs for a total of 40 meq. Lipids and MVI & trace elements M-W-F only due to shortages. Labs per protocol Monitor renal fxn, glucose tolerance, fluid status, lytes  Gilman Buttner, [place] J 12/20/2010,9:04 AM

## 2010-12-20 NOTE — Progress Notes (Signed)
At 2300pm, pt pulled out foley catheter.  Jinny Sanders, RN sitting in room while actual sitter, Ronnette Hila, stepped out for a minute.  Pt's hands were under blanket and then she pulled out her hands with the foley catheter in her hands.  Bulb was still inflated and intact.  Pt then said she needed to use the bathroom; attempted to use bedpan but was unsuccessful.  Notified Dr. Orvan Falconer and requested new order for wrist restraints so pt would not pull out PICC.  Order received and pt placed in bilateral soft wrist restraints.  Explained to pt why we were placing her in restraints and what the criteria is to discontinue them.  Pt disoriented and not able to learn at this time, reinforcement needed.

## 2010-12-20 NOTE — Progress Notes (Signed)
Subjective:  Objective: Vital signs in last 24 hours: Temp:  [98 F (36.7 C)-99.8 F (37.7 C)] 98 F (36.7 C) (11/29 0404) Pulse Rate:  [54-109] 83  (11/29 0600) Resp:  [18-35] 27  (11/29 0500) BP: (94-146)/(53-93) 137/70 mmHg (11/29 0600) SpO2:  [78 %-100 %] 94 % (11/29 0805) FiO2 (%):  [30 %-60 %] 40 % (11/29 0805) Weight:  [95.4 kg (210 lb 5.1 oz)] 210 lb 5.1 oz (95.4 kg) (11/29 0404)  Intake/Output from previous day: 11/28 0701 - 11/29 0700 In: 3428.7 [I.V.:1460; IV Piggyback:700; TPN:1268.7] Out: 552 [Urine:550; Stool:2] Intake/Output this shift:    Lab Results:  Basename 12/19/10 0447  WBC 14.9*  HGB 11.4*  HCT 37.5  PLT 163   BMET  Basename 12/20/10 0410 12/19/10 1138  NA 144 148*  K 3.0* 2.8*  CL 105 106  CO2 35* 39*  GLUCOSE 164* 192*  BUN 13 16  CREATININE 1.07 1.12*  CALCIUM 8.5 8.5   Scheduled Meds:   . albuterol  2.5 mg Nebulization QID  . antiseptic oral rinse  15 mL Mouth Rinse Q4H  . azithromycin  500 mg Intravenous Q24H  . haloperidol lactate  2 mg Intravenous TID  . insulin aspart  0-15 Units Subcutaneous Q4H  . insulin glargine  12 Units Subcutaneous QHS  . ipratropium  0.5 mg Nebulization QID  . levothyroxine  76 mcg Intravenous Daily  . nicotine  21 mg Transdermal Daily  . pantoprazole (PROTONIX) IV  40 mg Intravenous Q24H  . piperacillin-tazobactam (ZOSYN)  IV  3.375 g Intravenous Q8H  . potassium chloride  10 mEq Intravenous Q1 Hr x 4  . potassium chloride  10 mEq Intravenous Q1 Hr x 4  . sodium chloride  10 mL Intracatheter Q12H  . vancomycin  750 mg Intravenous Q12H  . DISCONTD: levothyroxine  50 mcg Intravenous Daily  . DISCONTD: LORazepam  2 mg Intravenous Q4H  . DISCONTD: potassium chloride  10 mEq Intravenous Q1 Hr x 5   Continuous Infusions:   . dextrose 5 % with kcl 75 mL/hr at 12/20/10 0600  . TPN (CLINIMIX) +/- additives 50 mL/hr at 12/20/10 0100   And  . fat emulsion 250 mL (12/19/10 2000)  . TPN (CLINIMIX) +/-  additives 50 mL/hr at 12/19/10 1600  . TPN (CLINIMIX) +/- additives     PRN Meds:.acetaminophen, albuterol, LORazepam, ondansetron (ZOFRAN) IV, sodium chloride  Studies/Results: US Venous Img Lower Bilateral  12/18/2010  *RADIOLOGY REPORT*  Clinical Data: Fever.  Breathing problems.  BILATERAL UPPER EXTREMITY VENOUS DUPLEX ULTRASOUND  Technique:  Gray-scale sonography with graded compression, as well as color Doppler and duplex ultrasound were performed to evaluate the upper extremity deep venous system of both upper extremities from the level of the subclavian vein and including the jugular, axillary, basilic and upper cephalic vein.  Spectral Doppler was utilized to evaluate flow at rest and with distal augmentation maneuvers.  Comparison:  None.  Findings: From the level of the common femoral vein to the popliteal vein there is adequate color flow, compression and augmentation without evidence of a deep venous thrombosis on either side.  Calf veins were not able to be investigated secondary to poor patient cooperation.  IMPRESSION: No evidence of deep venous thrombosis on either side.  Original Report Authenticated By: Fuller Canada, M.D.    Assessment/Plan: See dictation    [name] 12/20/2010, 9:44 AM

## 2010-12-20 NOTE — ED Notes (Deleted)
pt  Candice Lennert, MD 12/20/10 1040

## 2010-12-20 NOTE — ED Provider Notes (Deleted)
Pt with altered mental status.    [name], MD 12/20/10 0830  [name], MD 12/20/10 [phone]

## 2010-12-20 NOTE — Progress Notes (Signed)
Subjective: The patient actually answers to her name. She denies shortness of breath and pain.  Objective: Vital signs in last 24 hours: Filed Vitals:   12/20/10 0404 12/20/10 0500 12/20/10 0600 12/20/10 0805  BP:  130/65 137/70   Pulse:  97 83   Temp: 98 F (36.7 C)     TempSrc: Axillary     Resp:  27    Height:      Weight: 95.4 kg (210 lb 5.1 oz)     SpO2:  95% 97% 94%   Weight change: 1.2 kg (2 lb 10.3 oz)  Intake/Output Summary (Last 24 hours) at 12/20/10 0923 Last data filed at 12/20/10 8119  Gross per 24 hour  Intake 3328.67 ml  Output    551 ml  Net 2777.67 ml   Physical Exam:  General: Less agitation and moving in the bed. No acute cardiopulmonary distress. Lungs: No obvious wheezing or crackles. Breathing not particularly labored given her constant movement in bed. Cardiovascular regular rate rhythm without murmurs gallops rubs Abdomen soft nontender nondistended Extremities: No evidence of bilateral lower extremity edema. Neurologic. Movement of all extremities with some confusion and less agitation. She squeezes the examiner's hands very tightly bilaterally. She opens her eyes upon request, but then closes them soon afterwards. No obvious cranial nerve deficits grossly.  Lab Results: Basic Metabolic Panel:  Lab 12/20/10 1478 12/19/10 1138 12/19/10 0447  NA 144 148* --  K 3.0* 2.8* --  CL 105 106 --  CO2 35* 39* --  GLUCOSE 164* 192* --  BUN 13 16 --  CREATININE 1.07 1.12* --  CALCIUM 8.5 8.5 --  MG 1.9 -- 2.3  PHOS 2.5 -- 4.7*   Liver Function Tests:  Lab 12/20/10 0410 12/19/10 0447  AST 23 27  ALT 138* 219*  ALKPHOS 84 83  BILITOT 1.0 1.0  PROT 5.2* 5.2*  ALBUMIN 2.4* 2.5*   No results found for this basename: LIPASE:2,AMYLASE:2 in the last 168 hours  Lab 12/15/10 1610  AMMONIA 32   CBC:  Lab 12/19/10 0447 12/17/10 0501  WBC 14.9* 12.5*  NEUTROABS 12.5* --  HGB 11.4* 11.3*  HCT 37.5 37.0  MCV 107.4* 106.9*  PLT 163 158    Cardiac Enzymes:  Lab 12/14/10 0759 12/13/10 2346 12/13/10 1551  CKTOTAL 475* 679* 575*  CKMB 3.8 4.2* 4.4*  CKMBINDEX -- -- --  TROPONINI <0.30 <0.30 <0.30   BNP:  Lab 12/18/10 0942 12/14/10 0800  POCBNP 6841.0* 1528.0*   D-Dimer: No results found for this basename: DDIMER:2 in the last 168 hours CBG:  Lab 12/20/10 0403 12/20/10 0051 12/19/10 2026 12/19/10 1701 12/19/10 1148  GLUCAP 153* 173* 144* 182* 187*   Hemoglobin A1C: No results found for this basename: HGBA1C in the last 168 hours Fasting Lipid Panel:  Lab 12/19/10 0447  CHOL 115  HDL --  LDLCALC --  TRIG 138  CHOLHDL --  LDLDIRECT --   Thyroid Function Tests:  Lab 12/14/10 0800 12/13/10 1206  TSH -- 0.151*  T4TOTAL -- --  FREET4 -- 0.88  T3FREE 1.6* --  THYROIDAB -- --   Coagulation:  Lab 12/17/10 0501 12/16/10 0447 12/15/10 0524 12/14/10 0949  LABPROT 18.0* 17.4* 18.4* 18.2*  INR 1.46 1.40 1.50* 1.48   Anemia Panel:  Lab 12/13/10 1206  VITAMINB12 >2000*  FOLATE --  FERRITIN 4434*  TIBC NOT CALC  IRON 283*  RETICCTPCT --    Urine Legionella negative  Repeat urinalysis Results for [name], [name] (MRN [id number])  as of 12/17/2010 09:11  Ref. Range 12/16/2010 18:37  Color, Urine Latest Range: YELLOW  YELLOW  Appearance Latest Range: CLEAR  CLEAR  Specific Gravity, Urine Latest Range: 1.005-1.030  1.020  pH Latest Range: 5.0-8.0  6.0  Glucose, UA Latest Range: NEGATIVE mg/dL NEGATIVE  Bilirubin Urine Latest Range: NEGATIVE  SMALL (A)  Ketones, ur Latest Range: NEGATIVE mg/dL TRACE (A)  Protein Latest Range: NEGATIVE mg/dL NEGATIVE  Urobilinogen, UA Latest Range: 0.0-1.0 mg/dL 1.0  Nitrite Latest Range: NEGATIVE  NEGATIVE  Leukocytes, UA Latest Range: NEGATIVE  NEGATIVE  WBC, UA Latest Range: <3 WBC/hpf 3-6  RBC / HPF Latest Range: <3 RBC/hpf 3-6  Bacteria, UA Latest Range: RARE  RARE  Crystals Latest Range: NEGATIVE  URIC ACID CRYSTALS (A)   Micro Results: Recent Results (from  the past 240 hour(s))  CULTURE, BLOOD (ROUTINE X 2)     Status: Normal   Collection Time   12/13/10  6:45 AM      Component Value Range Status Comment   Specimen Description BLOOD RIGHT ARM   Final    Special Requests BOTTLES DRAWN AEROBIC AND ANAEROBIC 6CC   Final    Culture NO GROWTH 5 DAYS   Final    Report Status 12/18/2010 FINAL   Final   URINE CULTURE     Status: Normal   Collection Time   12/13/10  6:55 AM      Component Value Range Status Comment   Specimen Description URINE, CATHETERIZED   Final    Special Requests NONE   Final    Setup Time [id number]   Final    Colony Count >=100,000 COLONIES/ML   Final    Culture ESCHERICHIA COLI   Final    Report Status 12/15/2010 FINAL   Final    Organism ID, Bacteria ESCHERICHIA COLI   Final   CULTURE, BLOOD (ROUTINE X 2)     Status: Normal   Collection Time   12/13/10  7:15 AM      Component Value Range Status Comment   Specimen Description BLOOD RIGHT ARM   Final    Special Requests BOTTLES DRAWN AEROBIC AND ANAEROBIC 6CC   Final    Culture NO GROWTH 5 DAYS   Final    Report Status 12/18/2010 FINAL   Final   MRSA PCR SCREENING     Status: Normal   Collection Time   12/13/10  1:45 PM      Component Value Range Status Comment   MRSA by PCR NEGATIVE  NEGATIVE  Final   CULTURE, BLOOD (ROUTINE X 2)     Status: Normal (Preliminary result)   Collection Time   12/16/10  5:13 PM      Component Value Range Status Comment   Specimen Description BLOOD LEFT ANTECUBITAL   Final    Special Requests BOTTLES DRAWN AEROBIC AND ANAEROBIC 6CC   Final    Culture NO GROWTH 3 DAYS   Final    Report Status PENDING   Incomplete   CULTURE, BLOOD (ROUTINE X 2)     Status: Normal (Preliminary result)   Collection Time   12/16/10  5:14 PM      Component Value Range Status Comment   Specimen Description BLOOD PICC LINE DRAWN BY RN   Final    Special Requests BOTTLES DRAWN AEROBIC AND ANAEROBIC 7CC   Final    Culture NO GROWTH 3 DAYS   Final     Report Status PENDING   Incomplete  URINE CULTURE     Status: Normal   Collection Time   12/16/10  6:37 PM      Component Value Range Status Comment   Specimen Description URINE, CATHETERIZED   Final    Special Requests NONE   Final    Setup Time [id number]   Final    Colony Count NO GROWTH   Final    Culture NO GROWTH   Final    Report Status 12/18/2010 FINAL   Final    Studies/Results: US Venous Img Lower Bilateral  12/18/2010  *RADIOLOGY REPORT*  Clinical Data: Fever.  Breathing problems.  BILATERAL UPPER EXTREMITY VENOUS DUPLEX ULTRASOUND  Technique:  Gray-scale sonography with graded compression, as well as color Doppler and duplex ultrasound were performed to evaluate the upper extremity deep venous system of both upper extremities from the level of the subclavian vein and including the jugular, axillary, basilic and upper cephalic vein.  Spectral Doppler was utilized to evaluate flow at rest and with distal augmentation maneuvers.  Comparison:  None.  Findings: From the level of the common femoral vein to the popliteal vein there is adequate color flow, compression and augmentation without evidence of a deep venous thrombosis on either side.  Calf veins were not able to be investigated secondary to poor patient cooperation.  IMPRESSION: No evidence of deep venous thrombosis on either side.  Original Report Authenticated By: Fuller Canada, M.D.   Scheduled Meds:    . albuterol  2.5 mg Nebulization QID  . antiseptic oral rinse  15 mL Mouth Rinse Q4H  . azithromycin  500 mg Intravenous Q24H  . haloperidol lactate  2 mg Intravenous TID  . insulin aspart  0-15 Units Subcutaneous Q4H  . ipratropium  0.5 mg Nebulization QID  . levothyroxine  76 mcg Intravenous Daily  . nicotine  21 mg Transdermal Daily  . pantoprazole (PROTONIX) IV  40 mg Intravenous Q24H  . piperacillin-tazobactam (ZOSYN)  IV  3.375 g Intravenous Q8H  . potassium chloride  10 mEq Intravenous Q1 Hr x 4  .  potassium chloride  10 mEq Intravenous Q1 Hr x 4  . sodium chloride  10 mL Intracatheter Q12H  . vancomycin  750 mg Intravenous Q12H  . DISCONTD: levothyroxine  50 mcg Intravenous Daily  . DISCONTD: LORazepam  2 mg Intravenous Q4H  . DISCONTD: potassium chloride  10 mEq Intravenous Q1 Hr x 5   Continuous Infusions:    . dextrose 5 % with kcl 75 mL/hr at 12/20/10 0600  . TPN (CLINIMIX) +/- additives 50 mL/hr at 12/20/10 0100   And  . fat emulsion 250 mL (12/19/10 2000)  . TPN (CLINIMIX) +/- additives 50 mL/hr at 12/19/10 1600  . TPN (CLINIMIX) +/- additives    . DISCONTD: dextrose 5 % with KCl 20 mEq / L 20 mEq (12/19/10 0600)   PRN Meds:.acetaminophen, albuterol, LORazepam, ondansetron (ZOFRAN) IV, sodium chloride  Assessment/Plan:   *Encephalopathy continues, but she is clearly less agitated and more alert and follows simple commands which is a significant improvement overall. The patient's severe encephalopathy is likely multifactorial. Suspect withdrawal from benzodiazepines, opiate analgesics. Also from toxic metabolic encephalopathy. Doubt significant contribution of increase Celexa dose at this point, she because she is too far out from last dose. Of concern, is prolonged hypoxia prior to admission. Ammonia levels are now normal, so this should is not contributing at this point. Rule out subclinical seizures. EEG pending. If patient continues to spike fevers, she may need lumbar  puncture, though she would be at risk to do to her severe agitation, and obesity. She would require significantly more sedation. If needed, she has responded fairly well to the Geodon 20 mg IM. Continues to require restraints because she has pulling out lines and trying to get out of bed. She is too agitated to undergo MRI of the brain. She was almost a week without any nutrition, so TPN was started 12/18/2010.Marland Kitchen She is still too confused to take anything by mouth.. Also, nasogastric tube feeds would be difficult  and she would be at risk for aspiration do to her agitation and pulling at lines. I will place her on TPN for now. Neurologist, Dr. Gerilyn Pilgrim is following.  Fever, now resolved.  She remains on broad-spectrum antibiotics. Repeat cultures have been negative. Her repeat chest x-ray revealed right lower lobe pneumonia and Doppler of the legs were negative for DVT. Defer lumbar puncture to Dr. Gerilyn Pilgrim. If workup continues to be negative, could be drug fever.    Bilateral pneumonia with narrowing of right lower lobe bronchus: See above. Will need a repeat CAT scan in 3-6 months after resolution of her acute illness.  Ischemic hepatopathy: Liver enzymes INR nearly back to normal. Ammonia level normal. No further workup needed. Appreciate GI's input.   Acute respiratory failure with hypercapnia and hypoxia: Patient has evidence of bilateral pneumonia. I suspect she had a pulmonary infection prior to admission that did not show up on chest x-rays and that this may have been community-acquired or aspiration related. Urinary Legionella, strep pneumonia antigen is negative. Influenza PCR negative.  Also has most likely sleep apnea which is previously been undiagnosed as far as I can tell. echocardiogram shows good ejection fraction. She was given 40 mg of Lasix on November 27 for an elevated pro BNP and diuresis over 4 L. Added scheduled bronchodilators. Her repeat ABG on November 27 revealed ongoing hypoxia and hypercapnia, but overall, somewhat better. She has been off BiPAP for a few days, but continued to require nonrebreather mask    HYPERTENSION, BENIGN SYSTEMIC: Medications held   Escherichia coli UTI (lower urinary tract infection): Pansensitive except for ampicillin. Zosyn will cover. Repeat UA improved. Repeat urine culture negative to date.   Hypernatremia, now resolved. Being treated with hypotonic D5W. We'll decrease the rate.  Hypokalemia: Repleting the IV run is and in hypotonic IV fluids.  Magnesium level normal.  Nutrition. She is on TPN.  HYPERCHOLESTEROLEMIA: Statins stopped   OBESITY, NOS   ANXIETY: On chronic benzodiazepines. Will change Ativan from scheduled to when necessary.  Chronic opiates and benzodiazepines. Dilaudid was stopped by Dr. Gerilyn Pilgrim.    Ankylosing spondylitis   CKD (chronic kidney disease), stage III   Macrocytosis. Vitamin B 12 and folate levels are actually elevated.  Hypothyroidism: Continue current and repeat thyroid function tests as an outpatient. She is on IV level thyroxine. Will return to by mouth Synthroid when she is able to take medications by mouth.  Critical care time 40 minutes.   LOS: 7 days   [name] 12/20/2010, 9:23 AM

## 2010-12-20 NOTE — ED Notes (Deleted)
Pt seen by dr. Fredricka Bonine.  [name], MD 12/20/10 [phone]

## 2010-12-21 ENCOUNTER — Inpatient Hospital Stay (HOSPITAL_COMMUNITY): Payer: BC Managed Care – PPO

## 2010-12-21 LAB — GLUCOSE, CAPILLARY
Glucose-Capillary: 144 mg/dL — ABNORMAL HIGH (ref 70–99)
Glucose-Capillary: 162 mg/dL — ABNORMAL HIGH (ref 70–99)

## 2010-12-21 LAB — CBC
HCT: 39.1 % (ref 36.0–46.0)
Hemoglobin: 12.2 g/dL (ref 12.0–15.0)
MCHC: 31.2 g/dL (ref 30.0–36.0)
MCV: 104.5 fL — ABNORMAL HIGH (ref 78.0–100.0)

## 2010-12-21 LAB — BASIC METABOLIC PANEL
BUN: 14 mg/dL (ref 6–23)
CO2: 30 mEq/L (ref 19–32)
Chloride: 109 mEq/L (ref 96–112)
GFR calc Af Amer: 63 mL/min — ABNORMAL LOW (ref >90)
Potassium: 3.9 mEq/L (ref 3.5–5.1)

## 2010-12-21 LAB — VANCOMYCIN, TROUGH: Vancomycin Tr: 7.5 ug/mL — ABNORMAL LOW (ref 10.0–20.0)

## 2010-12-21 MED ORDER — FAT EMULSION 20 % IV EMUL
250.0000 mL | INTRAVENOUS | Status: AC
  Administered 2010-12-21: 250 mL via INTRAVENOUS
  Filled 2010-12-21: qty 250

## 2010-12-21 MED ORDER — VANCOMYCIN HCL IN DEXTROSE 1-5 GM/200ML-% IV SOLN
1000.0000 mg | Freq: Two times a day (BID) | INTRAVENOUS | Status: DC
  Administered 2010-12-21 – 2010-12-23 (×4): 1000 mg via INTRAVENOUS
  Filled 2010-12-21 (×6): qty 200

## 2010-12-21 MED ORDER — MAGNESIUM SULFATE 50 % IJ SOLN: 500.0000 mg | Freq: Once | INTRAMUSCULAR | Status: DC

## 2010-12-21 MED ORDER — MAGNESIUM SULFATE 50 % IJ SOLN
Freq: Once | INTRAVENOUS | Status: AC
  Administered 2010-12-21: 11:00:00 via INTRAVENOUS
  Filled 2010-12-21: qty 1

## 2010-12-21 MED ORDER — TRACE MINERALS CR-CU-MN-SE-ZN 10-1000-500-60 MCG/ML IV SOLN
INTRAVENOUS | Status: AC
  Administered 2010-12-21: 18:00:00 via INTRAVENOUS
  Filled 2010-12-21: qty 2000

## 2010-12-21 NOTE — Progress Notes (Signed)
Subjective: Patient reports  Objective: Vital signs in last 24 hours: Temp:  [98.4 F (36.9 C)-99.7 F (37.6 C)] 99.3 F (37.4 C) (11/30 0419) Pulse Rate:  [33-118] 82  (11/30 0600) Resp:  [17-37] 28  (11/30 0600) BP: (85-130)/(44-93) 109/53 mmHg (11/30 0600) SpO2:  [78 %-100 %] 100 % (11/30 0755) Weight:  [95.7 kg (210 lb 15.7 oz)] 210 lb 15.7 oz (95.7 kg) (11/30 0500)  Intake/Output from previous day: 11/29 0701 - 11/30 0700 In: 2619.7 [I.V.:1369.7; IV Piggyback:1250] Out: 2325 [Urine:2325] Intake/Output this shift:    Scheduled Meds:   . albuterol  2.5 mg Nebulization QID  . antiseptic oral rinse  15 mL Mouth Rinse Q4H  . azithromycin  500 mg Intravenous Q24H  . haloperidol lactate  2 mg Intravenous TID  . insulin aspart  0-15 Units Subcutaneous Q4H  . insulin glargine  12 Units Subcutaneous QHS  . ipratropium  0.5 mg Nebulization QID  . levothyroxine  76 mcg Intravenous Daily  . magnesium sulfate infusion  500 mg Intravenous Once  . nicotine  21 mg Transdermal Daily  . pantoprazole (PROTONIX) IV  40 mg Intravenous Q24H  . piperacillin-tazobactam (ZOSYN)  IV  3.375 g Intravenous Q8H  . potassium chloride  10 mEq Intravenous Q1 Hr x 4  . potassium chloride  10 mEq Intravenous Q1 Hr x 4  . potassium chloride      . sodium chloride  10 mL Intracatheter Q12H  . vancomycin  750 mg Intravenous Q12H  . DISCONTD: potassium chloride  10 mEq Intravenous Q1 Hr x 5   Continuous Infusions:   . dextrose 5 % with kcl 40 mL/hr at 12/20/10 1900  . TPN (CLINIMIX) +/- additives 50 mL/hr at 12/20/10 0100   And  . fat emulsion 250 mL (12/19/10 2000)  . TPN (CLINIMIX) +/- additives 65 mL/hr at 12/20/10 1734   PRN Meds:.acetaminophen, albuterol, LORazepam, ondansetron (ZOFRAN) IV, sodium chloride   Lab Results:  [facility] 12/21/10 0418 12/19/10 0447  WBC 11.6* 14.9*  HGB 12.2 11.4*  HCT 39.1 37.5  PLT 200 163   BMET  Basename 12/21/10 0418 12/20/10 1536  NA 143 143  K 3.9  3.1*  CL 109 107  CO2 30 32  GLUCOSE 140* 149*  BUN 14 13  CREATININE 1.15* 1.10  CALCIUM 8.6 8.7    Studies/Results: No results found.  Assessment/Plan: See dictation    [name] 12/21/2010, 8:14 AM

## 2010-12-21 NOTE — Plan of Care (Signed)
Problem: Inadequate Intake (NI-2.1) Goal: Food and/or nutrient delivery Individualized approach for food/nutrient provision.  Outcome: Progressing  Patient is receiving TPN with Clinimix E 5/15 @ 65 ml/hr.  Lipids (20% IVFE @ 10 ml/hr), multivitamins, and trace elements are provided 3 times weekly (MWF) due to national backorder.  Provides 1313 kcal and 78 grams protein daily (based on weekly average).  Meets 82% minimum estimated kcal and 78% minimum estimated protein needs.  Additional IVF with D5 @ 40 ml/hr provides 170 additional kcal per day.

## 2010-12-21 NOTE — Progress Notes (Signed)
NAMEDINISHA, CAI                 ACCOUNT NO.:  [id number]  MEDICAL RECORD NO.:  [id number]  LOCATION:                                 FACILITY:  PHYSICIAN:  Katlyn Muldrew A. Gerilyn Pilgrim, M.D. DATE OF BIRTH:  08/10/1960  DATE OF PROCEDURE:  12/19/2010 DATE OF DISCHARGE:                                PROGRESS NOTE   Nurses report that she has improved significantly.  Nurses report that she opened her eyes and was a lot more responsive today.  She again has been off the Dilaudid.  She still is on Ativan, although Dr. Chandra Batch is talking about reducing this to p.r.n.  OBJECTIVE DATA:  VITAL SIGNS:  Still did spike a temperature of 102, heart rate 77, respirations 20, blood pressure 106/56, sating 100%, FiO2 is 60%. HEENT:  Head is normocephalic, atraumatic. NECK:  Supple. ABDOMEN:  Soft. EXTREMITIES:  Mild edema. MENTATION:  She is more responsive.  She speaks in 2-word sentences, does follow the midline commands, but not appendicular commands for me. Pupils are reactive.  She does have full extraocular movements.  Facial muscle strength is symmetric.  She continues to have good movement to motor examination bilaterally.  Bulk and tone are normal.  She has good strength throughout.  No tremors or dysmetria observed.  LABORATORY EVALUATION:  WBC 14.9, hemoglobin 11, platelet count of 163. Sodium 152, potassium 2.7, chloride 107, CO2 of 41, glucose 176, BUN 16, creatinine 1.3, calcium 8.2.  MEDICATIONS:  Azithromycin, Lasix, Haldol, Atrovent, levothyroxine, Protonix, Zosyn, potassium, vancomycin.  ASSESSMENT AND PLAN:  Multifactorial toxic metabolic encephalopathy. There appears to be some mild-to-moderate improvement in her cognition. I believe some of the encephalopathy may have been due to sedation, particularly with her creatinine level being elevated.  I will continue to follow the patient to see how she progresses.  Continue with current care for now.     Brithany Whitworth A. Gerilyn Pilgrim,  M.D.     KAD/MEDQ  D:  12/20/2010  T:  12/20/2010  Job:  [id number]

## 2010-12-21 NOTE — Progress Notes (Signed)
[name], [name]                 ACCOUNT NO.:  [id number]  MEDICAL RECORD NO.:  [id number]  LOCATION:  [facility]05                          FACILITY:  [facility]  PHYSICIAN:  Wojciech Willetts A. Gerilyn Pilgrim, M.D. DATE OF BIRTH:  04/29/1960  DATE OF PROCEDURE: DATE OF DISCHARGE:                                PROGRESS NOTE   Nurses report that patient continues to improve.  Temperature 99.8%. She is defervesced 24 hours.  Heart rate 83, respiration 27, continues to be somewhat tachypneic although better than yesterday.  She is on 40% face mask, satting 94%.  She lays in bed with eyes closed.  She does open her eyes to sternal rub.  Actually had to do this a few times for her to open her eyes.  She did attempt to follow central commands but not appendicular commands.  Again, pupils are reactive.  Oculocephalic reflexes are intact.  Corneal reflexes are intact.  She has supple neck. Head is normocephalic.  Abdomen is soft.  Motor examination shows vigorous strength in both upper and lower extremities bilaterally.  LABORATORY FINDINGS:  WBCs 15, hemoglobin 11, platelet count of 153. Sodium is improving, it is 124 down from the mid 50s.  Potassium 3, chloride 105, CO2 35, glucose 164, BUN 13, creatinine 1/  Medications are reviewed and essentially she is unchanged.  She is on triple antibiotics.  ASSESSMENT AND PLAN:  Multifactorial toxic metabolic encephalopathy. She continues to improve slowly and is to continue with the current regimen, continue to follow her neurologically.     [name] A. Gerilyn Pilgrim, M.D.     KAD/MEDQ  D:  12/20/2010  T:  12/21/2010  Job:  [id number]

## 2010-12-21 NOTE — Progress Notes (Signed)
Subjective: Nursing reports that she had a relatively good night. She answers to her name and denies pain.  Objective: Vital signs in last 24 hours: Filed Vitals:   12/21/10 0419 12/21/10 0500 12/21/10 0600 12/21/10 0755  BP:  116/60 109/53   Pulse:  74 82   Temp: 99.3 F (37.4 C)     TempSrc: Oral     Resp:  20 28   Height:      Weight:  95.7 kg (210 lb 15.7 oz)    SpO2:  98% 99% 100%   Weight change: 0.3 kg (10.6 oz)  Intake/Output Summary (Last 24 hours) at 12/21/10 0810 Last data filed at 12/21/10 0602  Gross per 24 hour  Intake 2469.66 ml  Output   2325 ml  Net 144.66 ml   Physical Exam:  General: Overall, less agitated. No acute distress. Lungs: Audible wheezing throughout all lung fields this morning. Cardiovascular regular rate rhythm without murmurs gallops rubs Abdomen soft nontender nondistended Extremities: No evidence of bilateral lower extremity edema. Neurologic: Answers questions with one-word answers. Does not engage in active conversation otherwise. Yesterday afternoon, she was quite talkative and even joked a little while she was getting her hair washed.  Lab Results: Basic Metabolic Panel:  Lab 12/21/10 4098 12/20/10 1536 12/20/10 0410 12/19/10 0447  NA 143 143 -- --  K 3.9 3.1* -- --  CL 109 107 -- --  CO2 30 32 -- --  GLUCOSE 140* 149* -- --  BUN 14 13 -- --  CREATININE 1.15* 1.10 -- --  CALCIUM 8.6 8.7 -- --  MG 2.1 -- 1.9 --  PHOS -- -- 2.5 4.7*   Liver Function Tests:  Lab 12/20/10 0410 12/19/10 0447  AST 23 27  ALT 138* 219*  ALKPHOS 84 83  BILITOT 1.0 1.0  PROT 5.2* 5.2*  ALBUMIN 2.4* 2.5*   No results found for this basename: LIPASE:2,AMYLASE:2 in the last 168 hours  Lab 12/15/10 1610  AMMONIA 32   CBC:  Lab 12/21/10 0418 12/19/10 0447  WBC 11.6* 14.9*  NEUTROABS -- 12.5*  HGB 12.2 11.4*  HCT 39.1 37.5  MCV 104.5* 107.4*  PLT 200 163   Cardiac Enzymes: No results found for this basename:  CKTOTAL:3,CKMB:3,CKMBINDEX:3,TROPONINI:3 in the last 168 hours BNP:  Lab 12/18/10 0942  POCBNP 6841.0*   D-Dimer: No results found for this basename: DDIMER:2 in the last 168 hours CBG:  Lab 12/21/10 0724 12/21/10 0430 12/21/10 0013 12/20/10 2015 12/20/10 1659 12/20/10 1227  GLUCAP 140* 136* 127* 124* 163* 129*   Hemoglobin A1C: No results found for this basename: HGBA1C in the last 168 hours Fasting Lipid Panel:  Lab 12/19/10 0447  CHOL 115  HDL --  LDLCALC --  TRIG 138  CHOLHDL --  LDLDIRECT --   Thyroid Function Tests: No results found for this basename: TSH,T4TOTAL,FREET4,T3FREE,THYROIDAB in the last 168 hours Coagulation:  Lab 12/17/10 0501 12/16/10 0447 12/15/10 0524 12/14/10 0949  LABPROT 18.0* 17.4* 18.4* 18.2*  INR 1.46 1.40 1.50* 1.48   Anemia Panel: No results found for this basename: VITAMINB12,FOLATE,FERRITIN,TIBC,IRON,RETICCTPCT in the last 168 hours  Urine Legionella negative  Repeat urinalysis Results for SANDA, DEJOY (MRN [id number]) as of 12/17/2010 09:11  Ref. Range 12/16/2010 18:37  Color, Urine Latest Range: YELLOW  YELLOW  Appearance Latest Range: CLEAR  CLEAR  Specific Gravity, Urine Latest Range: 1.005-1.030  1.020  pH Latest Range: 5.0-8.0  6.0  Glucose, UA Latest Range: NEGATIVE mg/dL NEGATIVE  Bilirubin Urine Latest Range:  NEGATIVE  SMALL (A)  Ketones, ur Latest Range: NEGATIVE mg/dL TRACE (A)  Protein Latest Range: NEGATIVE mg/dL NEGATIVE  Urobilinogen, UA Latest Range: 0.0-1.0 mg/dL 1.0  Nitrite Latest Range: NEGATIVE  NEGATIVE  Leukocytes, UA Latest Range: NEGATIVE  NEGATIVE  WBC, UA Latest Range: <3 WBC/hpf 3-6  RBC / HPF Latest Range: <3 RBC/hpf 3-6  Bacteria, UA Latest Range: RARE  RARE  Crystals Latest Range: NEGATIVE  URIC ACID CRYSTALS (A)   Micro Results: Recent Results (from the past 240 hour(s))  CULTURE, BLOOD (ROUTINE X 2)     Status: Normal   Collection Time   12/13/10  6:45 AM      Component Value Range Status  Comment   Specimen Description BLOOD RIGHT ARM   Final    Special Requests BOTTLES DRAWN AEROBIC AND ANAEROBIC 6CC   Final    Culture NO GROWTH 5 DAYS   Final    Report Status 12/18/2010 FINAL   Final   URINE CULTURE     Status: Normal   Collection Time   12/13/10  6:55 AM      Component Value Range Status Comment   Specimen Description URINE, CATHETERIZED   Final    Special Requests NONE   Final    Setup Time [id number]   Final    Colony Count >=100,000 COLONIES/ML   Final    Culture ESCHERICHIA COLI   Final    Report Status 12/15/2010 FINAL   Final    Organism ID, Bacteria ESCHERICHIA COLI   Final   CULTURE, BLOOD (ROUTINE X 2)     Status: Normal   Collection Time   12/13/10  7:15 AM      Component Value Range Status Comment   Specimen Description BLOOD RIGHT ARM   Final    Special Requests BOTTLES DRAWN AEROBIC AND ANAEROBIC 6CC   Final    Culture NO GROWTH 5 DAYS   Final    Report Status 12/18/2010 FINAL   Final   MRSA PCR SCREENING     Status: Normal   Collection Time   12/13/10  1:45 PM      Component Value Range Status Comment   MRSA by PCR NEGATIVE  NEGATIVE  Final   CULTURE, BLOOD (ROUTINE X 2)     Status: Normal (Preliminary result)   Collection Time   12/16/10  5:13 PM      Component Value Range Status Comment   Specimen Description BLOOD LEFT ANTECUBITAL   Final    Special Requests BOTTLES DRAWN AEROBIC AND ANAEROBIC 6CC   Final    Culture NO GROWTH 4 DAYS   Final    Report Status PENDING   Incomplete   CULTURE, BLOOD (ROUTINE X 2)     Status: Normal (Preliminary result)   Collection Time   12/16/10  5:14 PM      Component Value Range Status Comment   Specimen Description BLOOD PICC LINE DRAWN BY RN   Final    Special Requests BOTTLES DRAWN AEROBIC AND ANAEROBIC 7CC   Final    Culture NO GROWTH 4 DAYS   Final    Report Status PENDING   Incomplete   URINE CULTURE     Status: Normal   Collection Time   12/16/10  6:37 PM      Component Value Range Status  Comment   Specimen Description URINE, CATHETERIZED   Final    Special Requests NONE   Final    Setup Time [id number]  Final    Colony Count NO GROWTH   Final    Culture NO GROWTH   Final    Report Status 12/18/2010 FINAL   Final    Studies/Results: No results found. Scheduled Meds:    . albuterol  2.5 mg Nebulization QID  . antiseptic oral rinse  15 mL Mouth Rinse Q4H  . azithromycin  500 mg Intravenous Q24H  . haloperidol lactate  2 mg Intravenous TID  . insulin aspart  0-15 Units Subcutaneous Q4H  . insulin glargine  12 Units Subcutaneous QHS  . ipratropium  0.5 mg Nebulization QID  . levothyroxine  76 mcg Intravenous Daily  . magnesium sulfate infusion  500 mg Intravenous Once  . nicotine  21 mg Transdermal Daily  . pantoprazole (PROTONIX) IV  40 mg Intravenous Q24H  . piperacillin-tazobactam (ZOSYN)  IV  3.375 g Intravenous Q8H  . potassium chloride  10 mEq Intravenous Q1 Hr x 4  . potassium chloride  10 mEq Intravenous Q1 Hr x 4  . potassium chloride      . sodium chloride  10 mL Intracatheter Q12H  . vancomycin  750 mg Intravenous Q12H  . DISCONTD: potassium chloride  10 mEq Intravenous Q1 Hr x 5   Continuous Infusions:    . dextrose 5 % with kcl 40 mL/hr at 12/20/10 1900  . TPN (CLINIMIX) +/- additives 50 mL/hr at 12/20/10 0100   And  . fat emulsion 250 mL (12/19/10 2000)  . TPN (CLINIMIX) +/- additives 65 mL/hr at 12/20/10 1734   PRN Meds:.acetaminophen, albuterol, LORazepam, ondansetron (ZOFRAN) IV, sodium chloride  Assessment/Plan:   *Encephalopathy continues, but she is clearly less agitated and more alert and follows simple commands which is a significant improvement overall. The patient's severe encephalopathy is likely multifactorial. Suspect withdrawal from benzodiazepines, opiate analgesics. Doubt significant contribution of increase Celexa dose at this point, she because she is too far out from last dose. Of concern, is prolonged hypoxia prior to  admission. Ammonia levels are now normal, so this should is not contributing at this point. The EEG on 12/19/2010 revealed slowing but no epileptiform activity.  She was too agitated to undergo MRI of the brain, but now since she is improving clinically, we will postpone the MRI of the brain. This was discussed with neurologist Dr. Gerilyn Pilgrim. She was almost a week without any nutrition, so TPN was started 12/18/2010.Marland Kitchen She was too confused to take anything by mouth, however now that she is less confused, we will consult the speech therapist to see if she is oriented enough to undergo a swallow evaluation.  Neurologist, Dr. Gerilyn Pilgrim is following.  Fever, now resolved.    Bilateral pneumonia/COPD with a history of smoking 2 packs of cigarettes per day.  She has more audible wheezing this morning. She is receiving every 4 hour bronchodilator nebulizers. Would consider steroids, however, given her acute psychiatric/encephalopathic illness, would defer treatment for now. We'll give her 500 mg of IV magnesium to see if it helps with decreasing the bronchospasms. Will need a repeat CAT scan in 3-6 months after resolution of her acute illness. Continue Zosyn, azithromycin, and vancomycin for now. We'll check a followup chest x-ray today.  Ischemic hepatopathy: Liver enzymes INR nearly back to normal. Ammonia level normal. No further workup needed. Appreciate GI's input.   Acute respiratory failure with hypercapnia and hypoxia: Patient has evidence of bilateral pneumonia. I suspect she had a pulmonary infection prior to admission that did not show up on chest x-rays  and that this may have been community-acquired or aspiration related. Urinary Legionella, strep pneumonia antigen are negative. Influenza PCR negative.  Also has most likely sleep apnea which is previously been undiagnosed as far as I can tell. Echocardiogram shows good ejection fraction. She was given 40 mg of Lasix on November 27 for an elevated pro BNP  and diuresis over 4 L. Added scheduled bronchodilators. Her repeat ABG on November 27 revealed ongoing hypoxia and hypercapnia, but overall, somewhat better. She has been off BiPAP for a few days and for the first time, not requiring a nonrebreather mask.   HYPERTENSION, BENIGN SYSTEMIC: Medications held   Escherichia coli UTI (lower urinary tract infection): Pansensitive except for ampicillin. Zosyn will cover. Repeat UA improved. Repeat urine culture negative to date.   Hypernatremia, now resolved. Being treated with hypotonic D5W. We'll decrease the rate.  Hypokalemia: Repleting the IV run is and in hypotonic IV fluids. Magnesium level normal.  Nutrition. She is on TPN. Speech therapist will be consulted to attempt a swallow evaluation.  HYPERCHOLESTEROLEMIA: Statins stopped   OBESITY, NOS   ANXIETY: On chronic benzodiazepines. Will change Ativan from scheduled to when necessary.  Chronic opiates and benzodiazepines. Dilaudid was stopped by Dr. Gerilyn Pilgrim.    Ankylosing spondylitis   CKD (chronic kidney disease), stage III   Macrocytosis. Vitamin B 12 and folate levels are actually elevated.  Hypothyroidism: Continue current and repeat thyroid function tests as an outpatient. She is on IV level thyroxine. Will return to by mouth Synthroid when she is able to take medications by mouth.  Transfer from ICU to step down today.   LOS: 8 days   [name] 12/21/2010, 8:10 AM

## 2010-12-21 NOTE — Consult Note (Signed)
ANTIBIOTIC AND PARENTERAL NUTRITION CONSULT NOTE   Pharmacy Consult for TPN and Vancomycin Indication: unable to take food orally, too agitated for TF placement, encephalopathy, pneumonia  Allergies  Allergen Reactions  . Codeine     REACTION: N/V  . Latex    Patient Measurements: Height: 5\' 2"  (157.5 cm) Weight: 210 lb 15.7 oz (95.7 kg) IBW/kg (Calculated) : 50.1   Vital Signs: Temp: 98.3 F (36.8 C) (11/30 0800) Temp src: Oral (11/30 0800) BP: 115/65 mmHg (11/30 0900) Pulse Rate: 78  (11/30 0900) Intake/Output from previous day: 11/29 0701 - 11/30 0700 In: 2658.3 [I.V.:1408.3; IV Piggyback:1250] Out: 2325 [Urine:2325] Intake/Output from this shift: Total I/O In: 40 [I.V.:40] Out: -  Labs:  RECENT VANCOMYCIN TROUGH LEVELS: Recent Labs  Basename 12/21/10 0418   VANCOTROUGH 7.5*    Basename 12/21/10 0418 12/19/10 0447  WBC 11.6* 14.9*  HGB 12.2 11.4*  HCT 39.1 37.5  PLT 200 163  APTT -- --  INR -- --    Basename 12/21/10 0418 12/20/10 1536 12/20/10 0410 12/19/10 0447  NA 143 143 144 --  K 3.9 3.1* 3.0* --  CL 109 107 105 --  CO2 30 32 35* --  GLUCOSE 140* 149* 164* --  BUN 14 13 13  --  CREATININE 1.15* 1.10 1.07 --  LABCREA -- -- -- --  CREAT24HRUR -- -- -- --  CALCIUM 8.6 8.7 8.5 --  MG 2.1 -- 1.9 2.3  PHOS -- -- 2.5 4.7*  PROT -- -- 5.2* 5.2*  ALBUMIN -- -- 2.4* 2.5*  AST -- -- 23 27  ALT -- -- 138* 219*  ALKPHOS -- -- 84 83  BILITOT -- -- 1.0 1.0  BILIDIR -- -- -- --  IBILI -- -- -- --  PREALBUMIN -- -- -- 8.0*  CHOLHDL -- -- -- --  CHOL -- -- -- 115   Estimated Creatinine Clearance: 63.1 ml/min (by C-G formula based on Cr of 1.15).    Basename 12/21/10 0724 12/21/10 0430 12/21/10 0013  GLUCAP 140* 136* 127*   Medical History: Past Medical History  Diagnosis Date  . Allergy   . Arthritis   . Depression   . GERD (gastroesophageal reflux disease)   . Hyperlipidemia   . Hypertension   . CKD (chronic kidney disease) stage 3, GFR  30-59 ml/min   . Osteoporosis   . Hypothyroidism   . Anxiety   . Chronic pain syndrome   . Tobacco abuse   . Obesity   . Gastroparesis   . Ankylosing spondylitis   . Nephrolithiasis    Medications:  Scheduled:     . albuterol  2.5 mg Nebulization QID  . antiseptic oral rinse  15 mL Mouth Rinse Q4H  . azithromycin  500 mg Intravenous Q24H  . haloperidol lactate  2 mg Intravenous TID  . insulin aspart  0-15 Units Subcutaneous Q4H  . insulin glargine  12 Units Subcutaneous QHS  . ipratropium  0.5 mg Nebulization QID  . levothyroxine  76 mcg Intravenous Daily  . small volume/piggyback builder   Intravenous Once  . nicotine  21 mg Transdermal Daily  . pantoprazole (PROTONIX) IV  40 mg Intravenous Q24H  . piperacillin-tazobactam (ZOSYN)  IV  3.375 g Intravenous Q8H  . potassium chloride  10 mEq Intravenous Q1 Hr x 4  . potassium chloride  10 mEq Intravenous Q1 Hr x 4  . potassium chloride      . sodium chloride  10 mL Intracatheter Q12H  . vancomycin  1,000 mg Intravenous Q12H  . DISCONTD: magnesium sulfate infusion  500 mg Intravenous Once  . DISCONTD: vancomycin  750 mg Intravenous Q12H   Insulin Requirements in the past 24 hours:  Lantus insulin = 12 units (scheduled for bedtime daily) novolog insulin = 21-27 units (sliding scale)  Current Nutrition:  TPN  Assessment: Sugars still above goal despite SSI and Lantus Hypokalemia (K+ being replenished as needed) SCr rising some  Vancomycin trough below goal   Nutritional Goals:  ~1800 kCal, ~115-120 grams of protein per day  Plan:  Continue TPN at 65 ml/hr today. Replace K+ as needed.  Continue SSI to control sugars Add insulin to TPN: 15 units per liter Lipids and MVI & trace elements on M-W-F only due to med shortages. Increase Vancomycin to 1gm iv q12hrs F/U trough at steady state. Labs per protocol Monitor renal fxn, glucose tolerance, fluid status, lytes  Malcolm Hetz A 12/21/2010,10:23 AM

## 2010-12-21 NOTE — Progress Notes (Addendum)
Speech Language/Pathology Speech Language Pathology Treatment/Bedside Evaluation Patient Details Name: [name] MRN: [id number] DOB: [birth year] Today's Date: 12/21/2010  SLP Assessment/Plan/Recommendation ST eval with recommendations for puree diet and thin liquids with ST to address for diet upgrade  See eval for full report SLP Goals  Tolerate regular diet and thin liquids with no s/s aspiration  SLP Treatment General Seem for ST Eval with recs for puree diet and thin liquids  S/s aspiration with soft solids Plan to for ST to address goals for diet upgrade  Oral Cavity - Oral Hygiene Had lower teeth but did not have upper plate in  Assessment of Diet Tolerance Patient appears safe for puree and thin liquids at this time Will require assist with intake due to cognition and periods of decreased responsiveness Only feed when alert/awake  Clinical Impressions Patient exhibited impulsivity with large bite size and cough after swallow with soft solids, also patient still exhibiting times of decreased responsiveness, therefore, recommend puree diet for safe oral intake and ST to upgrade as patient condition tolerates For full information see Doc Flowsheets/Bedside Swallow Eval   Meda Klinefelter 12/21/2010, 3:15 PM

## 2010-12-22 LAB — BASIC METABOLIC PANEL
CO2: 29 mEq/L (ref 19–32)
Chloride: 106 mEq/L (ref 96–112)
GFR calc Af Amer: 63 mL/min — ABNORMAL LOW (ref >90)
Potassium: 3.9 mEq/L (ref 3.5–5.1)
Sodium: 140 mEq/L (ref 135–145)

## 2010-12-22 LAB — GLUCOSE, CAPILLARY
Glucose-Capillary: 145 mg/dL — ABNORMAL HIGH (ref 70–99)
Glucose-Capillary: 165 mg/dL — ABNORMAL HIGH (ref 70–99)
Glucose-Capillary: 157 mg/dL — ABNORMAL HIGH (ref 70–99)

## 2010-12-22 LAB — CULTURE, BLOOD (ROUTINE X 2)
Culture: NO GROWTH
Culture: NO GROWTH

## 2010-12-22 MED ORDER — CLINIMIX E/DEXTROSE (5/15) 5 % IV SOLN
INTRAVENOUS | Status: DC
  Administered 2010-12-22: 18:00:00 via INTRAVENOUS
  Filled 2010-12-22: qty 2000

## 2010-12-22 NOTE — Progress Notes (Signed)
Report given to Lindaann Slough, RN  By Leonard Downing, RN.  Pt is alert and oriented, VSS, pt's significant other is at the bedside.  Pt transferred to room 314 via wheelchair by L. Webster, NT.  Pt's belongings taken to the room with the pat and she was placed on telemetry.  Informed pt's nurse Clydie Braun that pt was now in room.

## 2010-12-22 NOTE — Progress Notes (Signed)
[name], [name]                 ACCOUNT NO.:  [id number]  MEDICAL RECORD NO.:  [id number]  LOCATION:  IC05                          FACILITY:  [facility]  PHYSICIAN:  Exodus Kutzer A. Gerilyn Pilgrim, M.D. DATE OF BIRTH:  04-28-1960  DATE OF PROCEDURE: DATE OF DISCHARGE:                                PROGRESS NOTE   Staff and Dr. Sherrie Mustache reports the patient has improved significantly which she apparently has.  On physical exam today, reports of her being jovial and making jokes.  T-max 99.8, temperature is 98.3, heart rate 78, respirations 25, she is satting 99% on room air, blood pressure 115/65.  Medications are reviewed and she continues to be in Haldol, triple antibiotics azithromycin, vancomycin and Zosyn.  WBC 11, hemoglobin 12, platelet count 2000.  Sodium 143, potassium 3.9, chloride 109, CO2 30, glucose 140, BUN 14, creatinine 1.1.  Today, she is dramatically improved.  She is awake and alert.  She is being cleaned by the nursing staff and is cooperating with the evaluation.  She is not agitated.  She is disoriented however, only oriented x1.  She does follow commands briskly.  Pupils are equal, round, and reactive to light and accommodation.  Extraocular movements are full.  Visual fields are intact.  Facial muscle and tone symmetric.  She has good strength bilaterally.  ASSESSMENT AND PLAN:  Resolving multifactorial toxic metabolic encephalopathy due to dehydration, sepsis, urosepsis and pneumonia.  She has responded well.  I think we will continue with the current care.  I do not believe that she needs further workup from a neurological standpoint as she is improving and continue to follow the patient.     Georgia Delsignore A. Gerilyn Pilgrim, M.D.     KAD/MEDQ  D:  12/21/2010  T:  12/22/2010  Job:  [id number]

## 2010-12-22 NOTE — Progress Notes (Signed)
Subjective: S: No specific complaints at this time, alert and oriented, no acute issues overnight, states does not like food.  Objective: Vital signs in last 24 hours: Blood pressure 116/75, pulse 106, temperature 97.8 F (36.6 C), temperature source Oral, resp. rate 26, height 5\' 2"  (1.575 m), weight 93.2 kg (205 lb 7.5 oz),, SpO2 97.00%. Filed Vitals:   12/22/10 0600 12/22/10 0700 12/22/10 0705 12/22/10 0800  BP: 115/61 122/73  116/75  Pulse: 91 89  106  Temp:    97.8 F (36.6 C)  TempSrc:    Oral  Resp: 23 31  26   Height:      Weight:      SpO2: 93% 95% 93% 97%   Weight change: -2.5 kg (-5 lb 8.2 oz)  Intake/Output Summary (Last 24 hours) at 12/22/10 0958 Last data filed at 12/22/10 9604  Gross per 24 hour  Intake   2870 ml  Output   3300 ml  Net   -430 ml   Physical Exam:  General: Overall, alert and oriented, No acute distress. Lungs: Decreased breath sounds at the bases otherwise fairly clear Cardiovascular regular rate rhythm without murmurs gallops rubs Abdomen soft nontender nondistended Extremities: No evidence of bilateral lower extremity edema. Neurologic: Much more alert and oriented and conversing  Lab Results: Basic Metabolic Panel:  Lab 12/22/10 5409 12/21/10 0418 12/20/10 0410 12/19/10 0447  NA 140 143 -- --  K 3.9 3.9 -- --  CL 106 109 -- --  CO2 29 30 -- --  GLUCOSE 145* 140* -- --  BUN 16 14 -- --  CREATININE 1.16* 1.15* -- --  CALCIUM 8.7 8.6 -- --  MG -- 2.1 1.9 --  PHOS -- -- 2.5 4.7*   Liver Function Tests:  Lab 12/20/10 0410 12/19/10 0447  AST 23 27  ALT 138* 219*  ALKPHOS 84 83  BILITOT 1.0 1.0  PROT 5.2* 5.2*  ALBUMIN 2.4* 2.5*   Lab 12/15/10 1610  AMMONIA 32   CBC:  Lab 12/21/10 0418 12/19/10 0447  WBC 11.6* 14.9*  NEUTROABS -- 12.5*  HGB 12.2 11.4*  HCT 39.1 37.5  MCV 104.5* 107.4*  PLT 200 163   BNP:  Lab 12/18/10 0942  POCBNP 6841.0*    CBG:  Lab 12/22/10 0742 12/22/10 0426 12/21/10 2346 12/21/10 2027  12/21/10 1615 12/21/10 1111  GLUCAP 145* 145* 162* 144* 144* 148*   Hemoglobin A1C: No results found for this basename: HGBA1C in the last 168 hours Fasting Lipid Panel:  Lab 12/19/10 0447  CHOL 115  HDL --  LDLCALC --  TRIG 138  CHOLHDL --  LDLDIRECT --   Thyroid Function Tests: No results found for this basename: TSH,T4TOTAL,FREET4,T3FREE,THYROIDAB in the last 168 hours Coagulation:  Lab 12/17/10 0501 12/16/10 0447  LABPROT 18.0* 17.4*  INR 1.46 1.40   Anemia Panel: No results found for this basename: VITAMINB12,FOLATE,FERRITIN,TIBC,IRON,RETICCTPCT in the last 168 hours  Urine Legionella negative  Repeat urinalysis Results for [name], [name] (MRN [id number]) as of 12/17/2010 09:11  Ref. Range 12/16/2010 18:37  Color, Urine Latest Range: YELLOW  YELLOW  Appearance Latest Range: CLEAR  CLEAR  Specific Gravity, Urine Latest Range: 1.005-1.030  1.020  pH Latest Range: 5.0-8.0  6.0  Glucose, UA Latest Range: NEGATIVE mg/dL NEGATIVE  Bilirubin Urine Latest Range: NEGATIVE  SMALL (A)  Ketones, ur Latest Range: NEGATIVE mg/dL TRACE (A)  Protein Latest Range: NEGATIVE mg/dL NEGATIVE  Urobilinogen, UA Latest Range: 0.0-1.0 mg/dL 1.0  Nitrite Latest Range: NEGATIVE  NEGATIVE  Leukocytes, UA Latest Range: NEGATIVE  NEGATIVE  WBC, UA Latest Range: <3 WBC/hpf 3-6  RBC / HPF Latest Range: <3 RBC/hpf 3-6  Bacteria, UA Latest Range: RARE  RARE  Crystals Latest Range: NEGATIVE  URIC ACID CRYSTALS (A)   Micro Results: Recent Results (from the past 240 hour(s))  CULTURE, BLOOD (ROUTINE X 2)     Status: Normal   Collection Time   12/13/10  6:45 AM      Component Value Range Status Comment   Specimen Description BLOOD RIGHT ARM   Final    Special Requests BOTTLES DRAWN AEROBIC AND ANAEROBIC 6CC   Final    Culture NO GROWTH 5 DAYS   Final    Report Status 12/18/2010 FINAL   Final   URINE CULTURE     Status: Normal   Collection Time   12/13/10  6:55 AM      Component Value Range  Status Comment   Specimen Description URINE, CATHETERIZED   Final    Special Requests NONE   Final    Setup Time [id number]   Final    Colony Count >=100,000 COLONIES/ML   Final    Culture ESCHERICHIA COLI   Final    Report Status 12/15/2010 FINAL   Final    Organism ID, Bacteria ESCHERICHIA COLI   Final   CULTURE, BLOOD (ROUTINE X 2)     Status: Normal   Collection Time   12/13/10  7:15 AM      Component Value Range Status Comment   Specimen Description BLOOD RIGHT ARM   Final    Special Requests BOTTLES DRAWN AEROBIC AND ANAEROBIC 6CC   Final    Culture NO GROWTH 5 DAYS   Final    Report Status 12/18/2010 FINAL   Final   MRSA PCR SCREENING     Status: Normal   Collection Time   12/13/10  1:45 PM      Component Value Range Status Comment   MRSA by PCR NEGATIVE  NEGATIVE  Final   CULTURE, BLOOD (ROUTINE X 2)     Status: Normal (Preliminary result)   Collection Time   12/16/10  5:13 PM      Component Value Range Status Comment   Specimen Description BLOOD LEFT ANTECUBITAL   Final    Special Requests BOTTLES DRAWN AEROBIC AND ANAEROBIC 6CC   Final    Culture NO GROWTH 4 DAYS   Final    Report Status PENDING   Incomplete   CULTURE, BLOOD (ROUTINE X 2)     Status: Normal (Preliminary result)   Collection Time   12/16/10  5:14 PM      Component Value Range Status Comment   Specimen Description BLOOD PICC LINE DRAWN BY RN   Final    Special Requests BOTTLES DRAWN AEROBIC AND ANAEROBIC 7CC   Final    Culture NO GROWTH 4 DAYS   Final    Report Status PENDING   Incomplete   URINE CULTURE     Status: Normal   Collection Time   12/16/10  6:37 PM      Component Value Range Status Comment   Specimen Description URINE, CATHETERIZED   Final    Special Requests NONE   Final    Setup Time [id number]   Final    Colony Count NO GROWTH   Final    Culture NO GROWTH   Final    Report Status 12/18/2010 FINAL   Final    Studies/Results: Dg Chest  Port 1 View  12/21/2010  *RADIOLOGY  REPORT*  Clinical Data: Follow up pneumonia.  Wheezing.  PORTABLE CHEST - 1 VIEW  Comparison: 12/18/2010.  Findings: Right central line tip seen to the level of the mid superior vena cava.  Right mid to lower lobe consolidation with right-sided pleural effusion and elevation right hemidiaphragm.  Medial left base consolidation.  Central pulmonary vascular prominence.  Heart size top normal.  No gross pneumothorax.  Postsurgical changes lower cervical spine.  IMPRESSION: Right mid to lower lobe infiltrate with pleural effusion.  Elevated right hemidiaphragm.  Medial left base atelectasis versus infiltrate.  Central pulmonary vascular prominence.  Original Report Authenticated By: Fuller Canada, M.D.   Scheduled Meds:    . albuterol  2.5 mg Nebulization QID  . antiseptic oral rinse  15 mL Mouth Rinse Q4H  . azithromycin  500 mg Intravenous Q24H  . haloperidol lactate  2 mg Intravenous TID  . insulin aspart  0-15 Units Subcutaneous Q4H  . insulin glargine  12 Units Subcutaneous QHS  . ipratropium  0.5 mg Nebulization QID  . levothyroxine  76 mcg Intravenous Daily  . small volume/piggyback builder   Intravenous Once  . nicotine  21 mg Transdermal Daily  . pantoprazole (PROTONIX) IV  40 mg Intravenous Q24H  . piperacillin-tazobactam (ZOSYN)  IV  3.375 g Intravenous Q8H  . sodium chloride  10 mL Intracatheter Q12H  . vancomycin  1,000 mg Intravenous Q12H  . DISCONTD: vancomycin  750 mg Intravenous Q12H   Continuous Infusions:    . dextrose 5 % with kcl 20 mL/hr at 12/22/10 0600  . TPN (CLINIMIX) +/- additives 65 mL/hr at 12/22/10 0600   And  . fat emulsion 500 kcal (12/22/10 0600)  . TPN (CLINIMIX) +/- additives 65 mL/hr at 12/21/10 1600  . TPN (CLINIMIX) +/- additives     PRN Meds:.acetaminophen, albuterol, LORazepam, ondansetron (ZOFRAN) IV, sodium chloride  Assessment/Plan:   *Encephalopathy: Likely multifactorial, improving, Suspect withdrawal from benzodiazepines, opiate  analgesics, hypoxia prior to admission, dehydration, urosepsis, pneumonia.  - EEG on 12/19/2010 revealed slowing but no epileptiform activity. Neurology (Dr. Gerilyn Pilgrim) following - Will attempt MRI as she's improving clinically and more alert and awake, cooperative.  - Continue TPN until patient's by mouth intake is improved.   Fever, now resolved.    Bilateral pneumonia/COPD with a history of smoking 2 packs of cigarettes per day/ aspiration pneumonitis. - Chest x-ray yesterday showed right mid to lower lobe infiltrate with pleural effusion  - Continue Zosyn, azithromycin, and vancomycin for now. - Repeat swallow elevation on Monday for upgrading diet.   Ischemic hepatopathy: Liver enzymes INR nearly back to normal. Ammonia level normal. No further workup needed. Appreciate GI's input.   Acute respiratory failure with hypercapnia and hypoxia:  Influenza PCR negative, urine Legionella, strep antigen negative.   - off BiPAP for a few days and currently oxygen saturations 93-97% without any oxygen supplementation. - Continue nebulizers, antibiotics per   HYPERTENSION, BENIGN SYSTEMIC: Medications held   Escherichia coli UTI (lower urinary tract infection): Pansensitive except for ampicillin. Zosyn will cover. Repeat UA improved. Repeat urine culture negative to date.   Hypernatremia, now resolved. Being treated with hypotonic D5W. We'll decrease the rate.  Nutrition. She is on TPN. -Started on pured diet yesterday, hopefully by mouth intake will improve over the next few days. Will attempt swallow evaluation on Monday to upgrade the diet. If by mouth intake improves by tomorrow will wean off the TPN.   HYPERCHOLESTEROLEMIA:  Statins stopped  OBESITY, NOS   ANXIETY: On chronic benzodiazepines. Will change Ativan from scheduled to when necessary.  Chronic opiates and benzodiazepines. Dilaudid was stopped by Dr. Gerilyn Pilgrim.    Ankylosing spondylitis   CKD (chronic kidney disease), stage  III   Macrocytosis. Vitamin B 12 and folate levels are actually elevated.  Hypothyroidism: Now patient on pured diet will change Synthroid to 100 mcg daily by mouth   Disposition: Transfer to telemetry monitored floor, out of bed with assistance, PT OT evaluation.   LOS: 9 days   Mykael Trott 12/22/2010, 9:58 AM

## 2010-12-22 NOTE — Consult Note (Signed)
ANTIBIOTIC AND PARENTERAL NUTRITION CONSULT NOTE   Pharmacy Consult for TPN and Vancomycin Indication: unable to take food orally, too agitated for TF placement, encephalopathy, pneumonia  Allergies  Allergen Reactions  . Codeine     REACTION: N/V  . Latex    Patient Measurements: Height: 5\' 2"  (157.5 cm) Weight: 205 lb 7.5 oz (93.2 kg) IBW/kg (Calculated) : 50.1   Vital Signs: Temp: 99.5 F (37.5 C) (12/01 0400) Temp src: Oral (12/01 0400) BP: 115/61 mmHg (12/01 0600) Pulse Rate: 91  (12/01 0600) Intake/Output from previous day: 11/30 0701 - 12/01 0700 In: 3040 [P.O.:150; I.V.:440; IV Piggyback:550; TPN:1900] Out: 3300 [Urine:3300] Intake/Output from this shift:   Labs:  RECENT VANCOMYCIN TROUGH LEVELS: Recent Labs  Basename 12/21/10 0418   VANCOTROUGH 7.5*    Basename 12/21/10 0418  WBC 11.6*  HGB 12.2  HCT 39.1  PLT 200  APTT --  INR --    Basename 12/22/10 0518 12/21/10 0418 12/20/10 1536 12/20/10 0410  NA 140 143 143 --  K 3.9 3.9 3.1* --  CL 106 109 107 --  CO2 29 30 32 --  GLUCOSE 145* 140* 149* --  BUN 16 14 13  --  CREATININE 1.16* 1.15* 1.10 --  LABCREA -- -- -- --  CREAT24HRUR -- -- -- --  CALCIUM 8.7 8.6 8.7 --  MG -- 2.1 -- 1.9  PHOS -- -- -- 2.5  PROT -- -- -- 5.2*  ALBUMIN -- -- -- 2.4*  AST -- -- -- 23  ALT -- -- -- 138*  ALKPHOS -- -- -- 84  BILITOT -- -- -- 1.0  BILIDIR -- -- -- --  IBILI -- -- -- --  PREALBUMIN -- -- -- --  CHOLHDL -- -- -- --  CHOL -- -- -- --   Estimated Creatinine Clearance: 61.6 ml/min (by C-G formula based on Cr of 1.16).    Basename 12/22/10 0742 12/22/10 0426 12/21/10 2346  GLUCAP 145* 145* 162*   Medical History: Past Medical History  Diagnosis Date  . Allergy   . Arthritis   . Depression   . GERD (gastroesophageal reflux disease)   . Hyperlipidemia   . Hypertension   . CKD (chronic kidney disease) stage 3, GFR 30-59 ml/min   . Osteoporosis   . Hypothyroidism   . Anxiety   . Chronic  pain syndrome   . Tobacco abuse   . Obesity   . Gastroparesis   . Ankylosing spondylitis   . Nephrolithiasis    Medications:  Scheduled:     . albuterol  2.5 mg Nebulization QID  . antiseptic oral rinse  15 mL Mouth Rinse Q4H  . azithromycin  500 mg Intravenous Q24H  . haloperidol lactate  2 mg Intravenous TID  . insulin aspart  0-15 Units Subcutaneous Q4H  . insulin glargine  12 Units Subcutaneous QHS  . ipratropium  0.5 mg Nebulization QID  . levothyroxine  76 mcg Intravenous Daily  . small volume/piggyback builder   Intravenous Once  . nicotine  21 mg Transdermal Daily  . pantoprazole (PROTONIX) IV  40 mg Intravenous Q24H  . piperacillin-tazobactam (ZOSYN)  IV  3.375 g Intravenous Q8H  . sodium chloride  10 mL Intracatheter Q12H  . vancomycin  1,000 mg Intravenous Q12H  . DISCONTD: magnesium sulfate infusion  500 mg Intravenous Once  . DISCONTD: vancomycin  750 mg Intravenous Q12H   Insulin Requirements in the past 24 hours:  Lantus insulin = 12 units (scheduled for bedtime daily) novolog  insulin = 11 units (sliding scale)  Current Nutrition:  TPN  Assessment: Sugars still above goal despite SSI and Lantus K+ replenished SCr stable  Vancomycin trough recently below goal   Nutritional Goals:  ~1800 kCal, ~115-120 grams of protein per day  Plan:  Continue TPN at 65 ml/hr today. Replace K+ as needed.  Continue SSI to control sugars Increase insulin to TPN: 20 units per bag Lipids and MVI & trace elements on M-W-F only due to med shortages. Continue Vancomycin to 1gm iv q12hrs Trough in am. Labs per protocol Monitor renal fxn, glucose tolerance, fluid status, lytes  Lamonte Richer R 12/22/2010,7:58 AM

## 2010-12-23 LAB — BASIC METABOLIC PANEL
CO2: 28 mEq/L (ref 19–32)
Calcium: 8.7 mg/dL (ref 8.4–10.5)
GFR calc non Af Amer: 54 mL/min — ABNORMAL LOW (ref >90)
Potassium: 3.9 mEq/L (ref 3.5–5.1)
Sodium: 138 mEq/L (ref 135–145)

## 2010-12-23 LAB — GLUCOSE, CAPILLARY
Glucose-Capillary: 118 mg/dL — ABNORMAL HIGH (ref 70–99)
Glucose-Capillary: 138 mg/dL — ABNORMAL HIGH (ref 70–99)

## 2010-12-23 LAB — CBC
Hemoglobin: 11.9 g/dL — ABNORMAL LOW (ref 12.0–15.0)
MCH: 32 pg (ref 26.0–34.0)
Platelets: 287 10*3/uL (ref 150–400)
RBC: 3.72 MIL/uL — ABNORMAL LOW (ref 3.87–5.11)
WBC: 10 10*3/uL (ref 4.0–10.5)

## 2010-12-23 LAB — VANCOMYCIN, TROUGH: Vancomycin Tr: 52.2 ug/mL (ref 10.0–20.0)

## 2010-12-23 MED ORDER — INSULIN REGULAR HUMAN 100 UNIT/ML IJ SOLN
INTRAVENOUS | Status: DC
  Filled 2010-12-23: qty 2000

## 2010-12-23 MED ORDER — SODIUM CHLORIDE 0.9 % IJ SOLN
INTRAMUSCULAR | Status: AC
  Filled 2010-12-23: qty 6

## 2010-12-23 MED ORDER — LEVOFLOXACIN IN D5W 750 MG/150ML IV SOLN
750.0000 mg | INTRAVENOUS | Status: DC
  Administered 2010-12-23 – 2010-12-24 (×2): 750 mg via INTRAVENOUS
  Filled 2010-12-23 (×3): qty 150

## 2010-12-23 MED ORDER — POTASSIUM CL IN DEXTROSE 5% 20 MEQ/L IV SOLN
20.0000 meq | INTRAVENOUS | Status: DC
  Administered 2010-12-23: 20 meq via INTRAVENOUS

## 2010-12-23 MED ORDER — TRAMADOL HCL 50 MG PO TABS
50.0000 mg | ORAL_TABLET | Freq: Three times a day (TID) | ORAL | Status: DC | PRN
  Administered 2010-12-23 – 2010-12-24 (×3): 50 mg via ORAL
  Filled 2010-12-23 (×3): qty 1

## 2010-12-23 MED ORDER — LEVOTHYROXINE SODIUM 100 MCG PO TABS
100.0000 ug | ORAL_TABLET | Freq: Every day | ORAL | Status: DC
  Administered 2010-12-24: 100 ug via ORAL
  Filled 2010-12-23 (×2): qty 1

## 2010-12-23 NOTE — Progress Notes (Signed)
Subjective: S: alert and oriented, no acute issues overnight, states does not like food, wants diet upgraded.  Objective: Vital signs in last 24 hours: Blood pressure 116/75, pulse 106, temperature 97.8 F (36.6 C), temperature source Oral, resp. rate 26, height 5\' 2"  (1.575 m), weight 93.2 kg (205 lb 7.5 oz),, SpO2 97.00%. Filed Vitals:                                                         Weight change:   Intake/Output Summary (Last 24 hours) at 12/23/10 1100 Last data filed at 12/23/10 0600  Gross per 24 hour  Intake    535 ml  Output   2300 ml  Net  -1765 ml   Physical Exam:  General: Overall, alert and oriented, No acute distress. Lungs: Decreased breath sounds at the bases otherwise fairly clear Cardiovascular regular rate rhythm without murmurs gallops rubs Abdomen soft nontender nondistended Extremities: No evidence of bilateral lower extremity edema. Neurologic: Much more alert and oriented and conversing  Lab Results: Basic Metabolic Panel:  Lab 12/23/10 1610 12/22/10 0518 12/21/10 0418 12/20/10 0410 12/19/10 0447  NA 138 140 -- -- --  K 3.9 3.9 -- -- --  CL 103 106 -- -- --  CO2 28 29 -- -- --  GLUCOSE 175* 145* -- -- --  BUN 14 16 -- -- --  CREATININE 1.16* 1.16* -- -- --  CALCIUM 8.7 8.7 -- -- --  MG -- -- 2.1 1.9 --  PHOS -- -- -- 2.5 4.7*   Liver Function Tests:  Lab 12/20/10 0410 12/19/10 0447  AST 23 27  ALT 138* 219*  ALKPHOS 84 83  BILITOT 1.0 1.0  PROT 5.2* 5.2*  ALBUMIN 2.4* 2.5*  No results found for this basename: AMMONIA:2 in the last 168 hours CBC:  Lab 12/23/10 0730 12/21/10 0418 12/19/10 0447  WBC 10.0 11.6* --  NEUTROABS -- -- 12.5*  HGB 11.9* 12.2 --  HCT 37.7 39.1 --  MCV 101.3* 104.5* --  PLT 287 200 --   BNP:  Lab 12/18/10 0942  POCBNP 6841.0*    CBG:  Lab 12/23/10 0017 12/22/10 2045 12/22/10 1646 12/22/10 1204 12/22/10 0742 12/22/10 0426  GLUCAP 138* 157* 165* 136* 145* 145*  Fasting Lipid  Panel:  Lab 12/19/10 0447  CHOL 115  HDL --  LDLCALC --  TRIG 138  CHOLHDL --  LDLDIRECT --   Coagulation:  Lab 12/17/10 0501  LABPROT 18.0*  INR 1.46   Urine Legionella negative  Repeat urinalysis Results for [name], [name] (MRN [id number]) as of 12/17/2010 09:11  Ref. Range 12/16/2010 18:37  Color, Urine Latest Range: YELLOW  YELLOW  Appearance Latest Range: CLEAR  CLEAR  Specific Gravity, Urine Latest Range: 1.005-1.030  1.020  pH Latest Range: 5.0-8.0  6.0  Glucose, UA Latest Range: NEGATIVE mg/dL NEGATIVE  Bilirubin Urine Latest Range: NEGATIVE  SMALL (A)  Ketones, ur Latest Range: NEGATIVE mg/dL TRACE (A)  Protein Latest Range: NEGATIVE mg/dL NEGATIVE  Urobilinogen, UA Latest Range: 0.0-1.0 mg/dL 1.0  Nitrite Latest Range: NEGATIVE  NEGATIVE  Leukocytes, UA Latest Range: NEGATIVE  NEGATIVE  WBC, UA Latest Range: <3 WBC/hpf 3-6  RBC / HPF Latest Range: <3 RBC/hpf 3-6  Bacteria, UA Latest Range: RARE  RARE  Crystals Latest Range: NEGATIVE  URIC ACID  CRYSTALS (A)   Micro Results: Recent Results (from the past 240 hour(s))  MRSA PCR SCREENING     Status: Normal   Collection Time   12/13/10  1:45 PM      Component Value Range Status Comment   MRSA by PCR NEGATIVE  NEGATIVE  Final   CULTURE, BLOOD (ROUTINE X 2)     Status: Normal   Collection Time   12/16/10  5:13 PM      Component Value Range Status Comment   Specimen Description BLOOD LEFT ANTECUBITAL   Final    Special Requests BOTTLES DRAWN AEROBIC AND ANAEROBIC 6CC   Final    Culture NO GROWTH 6 DAYS   Final    Report Status 12/22/2010 FINAL   Final   CULTURE, BLOOD (ROUTINE X 2)     Status: Normal   Collection Time   12/16/10  5:14 PM      Component Value Range Status Comment   Specimen Description BLOOD PICC LINE DRAWN BY RN   Final    Special Requests BOTTLES DRAWN AEROBIC AND ANAEROBIC 7CC   Final    Culture NO GROWTH 6 DAYS   Final    Report Status 12/22/2010 FINAL   Final   URINE CULTURE      Status: Normal   Collection Time   12/16/10  6:37 PM      Component Value Range Status Comment   Specimen Description URINE, CATHETERIZED   Final    Special Requests NONE   Final    Setup Time [id number]   Final    Colony Count NO GROWTH   Final    Culture NO GROWTH   Final    Report Status 12/18/2010 FINAL   Final    Studies/Results: No results found. Scheduled Meds:    . albuterol  2.5 mg Nebulization QID  . antiseptic oral rinse  15 mL Mouth Rinse Q4H  . azithromycin  500 mg Intravenous Q24H  . haloperidol lactate  2 mg Intravenous TID  . insulin aspart  0-15 Units Subcutaneous Q4H  . insulin glargine  12 Units Subcutaneous QHS  . ipratropium  0.5 mg Nebulization QID  . levofloxacin (LEVAQUIN) IV  750 mg Intravenous Q24H  . nicotine  21 mg Transdermal Daily  . pantoprazole (PROTONIX) IV  40 mg Intravenous Q24H  . sodium chloride  10 mL Intracatheter Q12H  . sodium chloride      . DISCONTD: levothyroxine  76 mcg Intravenous Daily  . DISCONTD: piperacillin-tazobactam (ZOSYN)  IV  3.375 g Intravenous Q8H  . DISCONTD: vancomycin  1,000 mg Intravenous Q12H   Continuous Infusions:    . dextrose 5 % with kcl 20 mL/hr at 12/22/10 1400  . TPN (CLINIMIX) +/- additives 65 mL/hr at 12/22/10 1400   And  . fat emulsion 500 kcal (12/22/10 1400)  . DISCONTD: TPN (CLINIMIX) +/- additives 65 mL/hr at 12/22/10 1806  . DISCONTD: TPN (CLINIMIX) +/- additives    . DISCONTD: TPN (CLINIMIX) +/- additives     PRN Meds:.acetaminophen, albuterol, LORazepam, ondansetron (ZOFRAN) IV, sodium chloride  Assessment/Plan:   *Encephalopathy: Likely multifactorial, improving, Suspect withdrawal from benzodiazepines, opiate analgesics, hypoxia prior to admission, dehydration, urosepsis, pneumonia.  - EEG on 12/19/2010 revealed slowing but no epileptiform activity. Neurology (Dr. Gerilyn Pilgrim) following - Will attempt MRI as she's improving clinically and more alert and awake, cooperative but tangential  (upon asking what she had for dinner, patient states, she ate Tacos with her husband). Since she is more  alert and awake, I will upgrade her diet to dysphagia 3 and have speech therapy reevaluate her in a.m.   Fever, now resolved.    Bilateral pneumonia/COPD with a history of smoking 2 packs of cigarettes per day/ aspiration pneumonitis/ acute respiratory failure: Improved - Narrow the antibiotics, DC vancomycin and Zosyn, as I will start Levaquin which also covers the Escherichia coli UTI. - Repeat swallow elevation on Monday.   Ischemic hepatopathy: Liver enzymes INR nearly back to normal. Ammonia level normal. No further workup needed. Appreciate GI's input.   HYPERTENSION, BENIGN SYSTEMIC: BP stable   Escherichia coli UTI (lower urinary tract infection): Pansensitive, narrow antibiotics to levaquin.   Hypernatremia, now resolved.   Nutrition - DC TPN, upgrade diet, repeat swallow evaluation in a.m.  HYPERCHOLESTEROLEMIA: Statins stopped  OBESITY, NOS   ANXIETY: On chronic benzodiazepines. Will change Ativan from scheduled to when necessary.  Chronic opiates and benzodiazepines. Dilaudid was stopped by Dr. Gerilyn Pilgrim.    Ankylosing spondylitis   CKD (chronic kidney disease), stage III   Macrocytosis. Vitamin B 12 and folate levels are actually elevated.  Hypothyroidism: DC IV Synthroid, change to by mouth levothyroxine 100 mcg/daily  Disposition: PTOT evaluation for disposition, likely DC in 24-48 hours.   LOS: 10 days   RAI,RIPUDEEP 12/23/2010, 11:00 AM

## 2010-12-23 NOTE — Consult Note (Signed)
ANTIBIOTIC AND PARENTERAL NUTRITION CONSULT NOTE   Pharmacy Consult for TPN and Vancomycin Indication: unable to take food orally, too agitated for TF placement, encephalopathy, pneumonia  Allergies  Allergen Reactions  . Codeine     REACTION: N/V  . Latex    Patient Measurements: Height: 5\' 2"  (157.5 cm) Weight: 205 lb 7.5 oz (93.2 kg) IBW/kg (Calculated) : 50.1   Vital Signs: Temp: 98.1 F (36.7 C) (12/02 0141) Temp src: Oral (12/02 0141) BP: 125/75 mmHg (12/02 0141) Pulse Rate: 96  (12/02 0141) Intake/Output from previous day: 12/01 0701 - 12/02 0700 In: 1325 [P.O.:600; I.V.:140; IV Piggyback:60; TPN:525] Out: 2300 [Urine:2300] Intake/Output from this shift:   Labs:  RECENT VANCOMYCIN TROUGH LEVELS: Recent Labs  [facility] 12/23/10 0730 12/21/10 0418   VANCOTROUGH 52.2* 7.5*    Basename 12/23/10 0730 12/21/10 0418  WBC 10.0 11.6*  HGB 11.9* 12.2  HCT 37.7 39.1  PLT 287 200  APTT -- --  INR -- --    Basename 12/23/10 0730 12/22/10 0518 12/21/10 0418  NA 138 140 143  K 3.9 3.9 3.9  CL 103 106 109  CO2 28 29 30   GLUCOSE 175* 145* 140*  BUN 14 16 14   CREATININE 1.16* 1.16* 1.15*  LABCREA -- -- --  CREAT24HRUR -- -- --  CALCIUM 8.7 8.7 8.6  MG -- -- 2.1  PHOS -- -- --  PROT -- -- --  ALBUMIN -- -- --  AST -- -- --  ALT -- -- --  ALKPHOS -- -- --  BILITOT -- -- --  BILIDIR -- -- --  IBILI -- -- --  PREALBUMIN -- -- --  CHOLHDL -- -- --  CHOL -- -- --   Estimated Creatinine Clearance: 61.6 ml/min (by C-G formula based on Cr of 1.16).    Basename 12/23/10 0017 12/22/10 2045 12/22/10 1646  GLUCAP 138* 157* 165*   Medical History: Past Medical History  Diagnosis Date  . Allergy   . Arthritis   . Depression   . GERD (gastroesophageal reflux disease)   . Hyperlipidemia   . Hypertension   . CKD (chronic kidney disease) stage 3, GFR 30-59 ml/min   . Osteoporosis   . Hypothyroidism   . Anxiety   . Chronic pain syndrome   . Tobacco abuse   .  Obesity   . Gastroparesis   . Ankylosing spondylitis   . Nephrolithiasis    Medications:  Scheduled:     . albuterol  2.5 mg Nebulization QID  . antiseptic oral rinse  15 mL Mouth Rinse Q4H  . azithromycin  500 mg Intravenous Q24H  . haloperidol lactate  2 mg Intravenous TID  . insulin aspart  0-15 Units Subcutaneous Q4H  . insulin glargine  12 Units Subcutaneous QHS  . ipratropium  0.5 mg Nebulization QID  . levothyroxine  76 mcg Intravenous Daily  . nicotine  21 mg Transdermal Daily  . pantoprazole (PROTONIX) IV  40 mg Intravenous Q24H  . piperacillin-tazobactam (ZOSYN)  IV  3.375 g Intravenous Q8H  . sodium chloride  10 mL Intracatheter Q12H  . sodium chloride      . vancomycin  1,000 mg Intravenous Q12H   Insulin Requirements in the past 24 hours:  Lantus insulin = 12 units (scheduled for bedtime daily) novolog insulin = 12 units (sliding scale)  Current Nutrition:  TPN Pureed Diet  Assessment: Sugars still above goal despite SSI and Lantus K+ replenished SCr stable  Vancomycin trough not accurate.  Drawn after  dose infused this morning.  Nutritional Goals:  ~1800 kCal, ~115-120 grams of protein per day  Plan:  Continue TPN at 65 ml/hr today. Continue SSI to control sugars Increase insulin to TPN: 30 units per bag Lipids and MVI & trace elements on M-W-F only due to med shortages. Continue Vancomycin to 1gm iv q12hrs Repeat trough this PM. Labs per protocol Monitor renal fxn, glucose tolerance, fluid status, lytes  Mady Gemma 12/23/2010,9:36 AM

## 2010-12-23 NOTE — Progress Notes (Signed)
BMET    Component Value Date/Time   NA 138 12/23/2010 0730   K 3.9 12/23/2010 0730   CL 103 12/23/2010 0730   CO2 28 12/23/2010 0730   GLUCOSE 175* 12/23/2010 0730   BUN 14 12/23/2010 0730   CREATININE 1.16* 12/23/2010 0730   CREATININE 1.20* 12/07/2010 0942   CALCIUM 8.7 12/23/2010 0730   GFRNONAA 54* 12/23/2010 0730   GFRAA 63* 12/23/2010 0730    Patient previously on D5W with 14meq/L at [facility].  Nursing [facility] called for missing IV fluid.  D5W w/ 20 mEq/L is available floor stock and I have switched KVO fluid to this item. Mady Gemma 12/23/2010 4:01 PM

## 2010-12-24 ENCOUNTER — Inpatient Hospital Stay (HOSPITAL_COMMUNITY): Payer: BC Managed Care – PPO

## 2010-12-24 LAB — BASIC METABOLIC PANEL
CO2: 29 mEq/L (ref 19–32)
Chloride: 104 mEq/L (ref 96–112)
GFR calc Af Amer: 60 mL/min — ABNORMAL LOW (ref >90)
Potassium: 3.8 mEq/L (ref 3.5–5.1)

## 2010-12-24 MED ORDER — CITALOPRAM HYDROBROMIDE 20 MG PO TABS: 20.0000 mg | ORAL_TABLET | ORAL | Status: DC

## 2010-12-24 MED ORDER — IPRATROPIUM BROMIDE 0.02 % IN SOLN
0.5000 mg | Freq: Two times a day (BID) | RESPIRATORY_TRACT | Status: DC
  Administered 2010-12-24: 0.5 mg via RESPIRATORY_TRACT
  Filled 2010-12-24: qty 2.5

## 2010-12-24 MED ORDER — ALPRAZOLAM 0.5 MG PO TABS: 0.5000 mg | ORAL_TABLET | Freq: Every evening | ORAL | Status: DC | PRN

## 2010-12-24 MED ORDER — CYCLOBENZAPRINE HCL 10 MG PO TABS: 10.0000 mg | ORAL_TABLET | Freq: Two times a day (BID) | ORAL | Status: DC | PRN

## 2010-12-24 MED ORDER — ALBUTEROL SULFATE (5 MG/ML) 0.5% IN NEBU
2.5000 mg | INHALATION_SOLUTION | Freq: Two times a day (BID) | RESPIRATORY_TRACT | Status: DC
  Administered 2010-12-24: 2.5 mg via RESPIRATORY_TRACT
  Filled 2010-12-24: qty 0.5

## 2010-12-24 NOTE — Discharge Summary (Signed)
Physician Discharge Summary  Patient ID: [name] MRN: [id number] DOB/AGE: September 07, 1960 50 y.o.  Admit date: 12/13/2010 Discharge date: 12/24/2010  Discharge Diagnoses:  Principal Problem:  *Encephalopathy acute, Active Problems:  Shock liver  Acute respiratory failure with hypercapnia pneumonia  HYPERTENSION, BENIGN SYSTEMIC  UTI (lower urinary tract infection)  ARF (acute renal failure)  Coagulopathy  HYPERCHOLESTEROLEMIA  OBESITY, NOS  ANXIETY  SMOKER  Ankylosing spondylitis  CKD (chronic kidney disease), stage III  Macrocytosis  Hypothyroidism  Hypernatremia  Hypokalemia   Current Discharge Medication List    CONTINUE these medications which have CHANGED   Details  ALPRAZolam (XANAX) 0.5 MG tablet Take 1 tablet (0.5 mg total) by mouth at bedtime as needed for sleep. Anxiety    citalopram (CELEXA) 20 MG tablet Take 1 tablet (20 mg total) by mouth every morning. Qty: 180 tablet, Refills: 1   Associated Diagnoses: Major depressive disorder, recurrent episode, unspecified    cyclobenzaprine (FLEXERIL) 10 MG tablet Take 1 tablet (10 mg total) by mouth 3 times/day as needed-between meals & bedtime for muscle spasms. Qty: 30 tablet      CONTINUE these medications which have NOT CHANGED   Details  aspirin EC 81 MG tablet Take 81 mg by mouth daily.      Cholecalciferol (VITAMIN D-3) 5000 UNITS TABS Take 1 capsule by mouth daily.      docusate sodium (COLACE) 100 MG capsule Take 100 mg by mouth 2 (two) times daily.      gabapentin (NEURONTIN) 100 MG capsule Take 200 mg by mouth 3 (three) times daily. Take 2 tabs by mouth two times a day    lansoprazole (PREVACID) 30 MG capsule TAKE 1 CAPSULE BY MOUTH EVERY DAY Qty: 90 capsule, Refills: 0    levothyroxine (SYNTHROID, LEVOTHROID) 100 MCG tablet Take 1 tablet (100 mcg total) by mouth daily. Qty: 90 tablet, Refills: 1    loperamide (IMODIUM) 2 MG capsule Take 2 mg by mouth 4 (four) times daily as needed. Diarrhea       metoCLOPramide (REGLAN) 5 MG tablet Take 5 mg by mouth 4 (four) times daily.     nortriptyline (PAMELOR) 25 MG capsule TAKE 1 CAPSULE BY MOUTH EVERY NIGHT AT BEDTIME Qty: 90 capsule, Refills: 0    oxyCODONE (OXYCONTIN) 20 MG 12 hr tablet Take 20 mg by mouth every 12 (twelve) hours.      pravastatin (PRAVACHOL) 20 MG tablet Take 20 mg by mouth Daily.        Discharge Orders    Future Appointments: Provider: Department: Dept Phone: Center:   01/02/2011 11:00 AM [name], DO [facility] [phone] [facility]     Future Orders Please Complete By Expires   Diet - low sodium heart healthy      Increase activity slowly         Follow-up Information    Follow up with SMITH,ZACHARY, DO in 1 week.   Contact information:   [address] [place] [address] [phone]       Follow up with Beryle Beams, MD in 1 month. (to consider sleep study)    Contact information:   [address] [place] [address] [phone]          Disposition:  home with home health nursing and 24-hour supervision  Discharged Condition: Stable  Consults: Jonette Eva, M.D.  Beryle Beams, MD  Labs:   Results for orders placed during the hospital encounter of 12/13/10 (from the past 48 hour(s))  GLUCOSE, CAPILLARY     Status: Abnormal   Collection Time   12/22/10  4:46 PM      Component Value Range Comment   Glucose-Capillary 165 (*) 70 - 99 (mg/dL)   GLUCOSE, CAPILLARY     Status: Abnormal   Collection Time   12/22/10  8:45 PM      Component Value Range Comment   Glucose-Capillary 157 (*) 70 - 99 (mg/dL)   GLUCOSE, CAPILLARY     Status: Abnormal   Collection Time   12/23/10 12:17 AM      Component Value Range Comment   Glucose-Capillary 138 (*) 70 - 99 (mg/dL)   VANCOMYCIN, TROUGH     Status: Abnormal   Collection Time   12/23/10  7:30 AM      Component Value Range Comment   Vancomycin Tr 52.2 (*) 10.0 - 20.0 (ug/mL)   CBC      Status: Abnormal   Collection Time   12/23/10  7:30 AM      Component Value Range Comment   WBC 10.0  4.0 - 10.5 (K/uL)    RBC 3.72 (*) 3.87 - 5.11 (MIL/uL)    Hemoglobin 11.9 (*) 12.0 - 15.0 (g/dL)    HCT 09.6  04.5 - 40.9 (%)    MCV 101.3 (*) 78.0 - 100.0 (fL)    MCH 32.0  26.0 - 34.0 (pg)    MCHC 31.6  30.0 - 36.0 (g/dL)    RDW 81.1 (*) 91.4 - 15.5 (%)    Platelets 287  150 - 400 (K/uL)   BASIC METABOLIC PANEL     Status: Abnormal   Collection Time   12/23/10  7:30 AM      Component Value Range Comment   Sodium 138  135 - 145 (mEq/L)    Potassium 3.9  3.5 - 5.1 (mEq/L)    Chloride 103  96 - 112 (mEq/L)    CO2 28  19 - 32 (mEq/L)    Glucose, Bld 175 (*) 70 - 99 (mg/dL)    BUN 14  6 - 23 (mg/dL)    Creatinine, Ser 7.82 (*) 0.50 - 1.10 (mg/dL)    Calcium 8.7  8.4 - 10.5 (mg/dL)    GFR calc non Af Amer 54 (*) >90 (mL/min)    GFR calc Af Amer 63 (*) >90 (mL/min)   GLUCOSE, CAPILLARY     Status: Abnormal   Collection Time   12/23/10  7:45 AM      Component Value Range Comment   Glucose-Capillary 172 (*) 70 - 99 (mg/dL)   GLUCOSE, CAPILLARY     Status: Abnormal   Collection Time   12/23/10 11:41 AM      Component Value Range Comment   Glucose-Capillary 134 (*) 70 - 99 (mg/dL)   GLUCOSE, CAPILLARY     Status: Abnormal   Collection Time   12/23/10  4:17 PM      Component Value Range Comment   Glucose-Capillary 133 (*) 70 - 99 (mg/dL)    Comment 1 Notify RN      Comment 2 Documented in Chart     GLUCOSE, CAPILLARY     Status: Abnormal   Collection Time   12/23/10  8:25 PM      Component Value Range Comment   Glucose-Capillary 118 (*) 70 - 99 (mg/dL)   BASIC METABOLIC PANEL     Status: Abnormal   Collection Time   12/24/10  4:36 AM      Component Value  Range Comment   Sodium 141  135 - 145 (mEq/L)    Potassium 3.8  3.5 - 5.1 (mEq/L)    Chloride 104  96 - 112 (mEq/L)    CO2 29  19 - 32 (mEq/L)    Glucose, Bld 111 (*) 70 - 99 (mg/dL)    BUN 10  6 - 23 (mg/dL)    Creatinine,  Ser 1.61 (*) 0.50 - 1.10 (mg/dL)    Calcium 9.1  8.4 - 10.5 (mg/dL)    GFR calc non Af Amer 52 (*) >90 (mL/min)    GFR calc Af Amer 60 (*) >90 (mL/min)     Diagnostics:  Ct Head Wo Contrast  12/13/2010  *RADIOLOGY REPORT*  Clinical Data: Altered mental status  CT HEAD WITHOUT CONTRAST  Technique:  Contiguous axial images were obtained from the base of the skull through the vertex without contrast.  Comparison: None.  Findings: There is no mass effect, midline shift, or acute intracranial hemorrhage.  Motion artifact does limit the examination.  Ventricular system and extraaxial space are grossly unremarkable.  Minimal mucosal thickening in the right maxillary sinus and sphenoid sinus.  Mastoid air cells are clear.  Cranium is intact.  IMPRESSION: No acute intracranial pathology.  Original Report Authenticated By: Donavan Burnet, M.D.   Ct Angio Chest W/cm &/or Wo Cm  12/15/2010  *RADIOLOGY REPORT*  Clinical Data:  Unexplained respiratory failure.  CT ANGIOGRAPHY CHEST WITH CONTRAST  Technique:  Multidetector CT imaging of the chest was performed using the standard protocol during bolus administration of intravenous contrast.  Multiplanar CT image reconstructions including MIPs were obtained to evaluate the vascular anatomy.  Contrast: OMNIPAQUE IOHEXOL 300 MG/ML IV SOLN  Comparison:  No comparison chest CT.  Findings:  No pulmonary embolus identified.  Consolidation lower lobes (greater on the right) and posterior aspect the right upper lobe with patchy consolidation remainder of right upper lobe.  The bronchus extending into the right lower lobe is narrowed.  This will require follow-up to exclude surrounding mass.  Mucous plug not excluded.  Mediastinal adenopathy most notable subcarinal position.  Cardiomegaly.  Limit evaluation of the aorta given the phase of contrast enhancement without gross abnormality identified.  Fatty infiltration of liver.  No free air in the upper abdomen noted.  PICC  line tip proximal superior vena cava.  Degenerative changes thoracic spine.  Lower cervical spine with poor definition of endplates which may represent degenerative changes.  Infection not excluded in the proper clinical setting.  Review of the MIP images confirms the above findings.  IMPRESSION: No pulmonary embolus noted.  Bilateral lower lobe and right upper lobe areas of consolidation. The bronchus supplying the right lower lobe is narrowed.  Please see above discussion.  Adenopathy most notable subcarinal position.  Cardiomegaly.  Lower cervical spine with poor definition of endplates which may represent degenerative changes.  Infection not excluded in the proper clinical setting.  This will be called to the floor by [facility] radiology technologist  Original Report Authenticated By: Fuller Canada, M.D.   Mr Brain Wo Contrast  12/24/2010  *RADIOLOGY REPORT*  Clinical Data: Mental status  MRI HEAD WITHOUT CONTRAST  Technique:  Multiplanar, multiecho pulse sequences of the brain and surrounding structures were obtained according to standard protocol without intravenous contrast.  Comparison: CT 12/13/2010  Findings: Ventricle size is normal.  Negative for acute or chronic infarct.  Negative for hemorrhage or mass lesion.  No edema in the brain.  Brainstem and cerebellum  are normal.  Paranasal sinuses are clear.  Image quality degraded by mild motion on some of the sequences.  IMPRESSION: No significant abnormality.  Original Report Authenticated By: Camelia Phenes, M.D.   US Abdomen Complete  12/13/2010  *RADIOLOGY REPORT*  Clinical Data:  Elevated liver function tests.  Acute renal failure.  ABDOMINAL ULTRASOUND COMPLETE  Comparison:  Renal ultrasound 10/01/2005  Findings:  Gallbladder:  Not visualized, presumably due to prior cholecystectomy.  Common Bile Duct:  Within normal limits in caliber. Measures 6 mm in diameter.  Liver: Diffusely increased parenchymal echogenicity, consistent with hepatic steatosis.   No focal liver mass identified.  Trace perihepatic fluid and right pleural effusion noted.  IVC:  Appears normal.  Pancreas:  No abnormality identified.  Spleen:  Within normal limits in size and echotexture.  Right kidney:  Normal in size and parenchymal echogenicity.  No evidence of mass or hydronephrosis.  Left kidney:  Small left kidney seen measuring 9.4 cm length, as seen on prior study.  Suboptimal visualization due to body habitus and patient's inability cooperate.  No evidence of mass or hydronephrosis.  Abdominal Aorta:  No aneurysm identified.  IMPRESSION:  1.  Prior cholecystectomy.  No evidence of biliary ductal dilatation. 2.  Hepatic steatosis.  No liver mass identified. 3.  Trace perihepatic ascites and right pleural effusion. 4.  Stable small left kidney.  No evidence of hydronephrosis.  Original Report Authenticated By: Danae Orleans, M.D.   US Venous Img Lower Bilateral  12/18/2010  *RADIOLOGY REPORT*  Clinical Data: Fever.  Breathing problems.  BILATERAL UPPER EXTREMITY VENOUS DUPLEX ULTRASOUND  Technique:  Gray-scale sonography with graded compression, as well as color Doppler and duplex ultrasound were performed to evaluate the upper extremity deep venous system of both upper extremities from the level of the subclavian vein and including the jugular, axillary, basilic and upper cephalic vein.  Spectral Doppler was utilized to evaluate flow at rest and with distal augmentation maneuvers.  Comparison:  None.  Findings: From the level of the common femoral vein to the popliteal vein there is adequate color flow, compression and augmentation without evidence of a deep venous thrombosis on either side.  Calf veins were not able to be investigated secondary to poor patient cooperation.  IMPRESSION: No evidence of deep venous thrombosis on either side.  Original Report Authenticated By: Fuller Canada, M.D.   Dg Chest Port 1 View  12/21/2010  *RADIOLOGY REPORT*  Clinical Data: Follow up  pneumonia.  Wheezing.  PORTABLE CHEST - 1 VIEW  Comparison: 12/18/2010.  Findings: Right central line tip seen to the level of the mid superior vena cava.  Right mid to lower lobe consolidation with right-sided pleural effusion and elevation right hemidiaphragm.  Medial left base consolidation.  Central pulmonary vascular prominence.  Heart size top normal.  No gross pneumothorax.  Postsurgical changes lower cervical spine.  IMPRESSION: Right mid to lower lobe infiltrate with pleural effusion.  Elevated right hemidiaphragm.  Medial left base atelectasis versus infiltrate.  Central pulmonary vascular prominence.  Original Report Authenticated By: Fuller Canada, M.D.   Dg Chest Port 1 View  12/18/2010  *RADIOLOGY REPORT*  Clinical Data: Fever.  Respiratory distress.  PORTABLE CHEST - 1 VIEW  Comparison: 12/15/2010.  Findings: Right lower lobe consolidation.  This has progressed since prior exam.  Cardiomegaly.  Pulmonary vascular congestion.  Right central line tip proximal superior vena cava level.  No gross pneumothorax.  IMPRESSION: Consolidation right lower lobe has progressed since  prior exam.  Cardiomegaly and pulmonary vascular congestion.  Original Report Authenticated By: Fuller Canada, M.D.   Dg Chest Port 1 View  12/15/2010  *RADIOLOGY REPORT*  Clinical Data: PICC line placement.  PORTABLE CHEST - 1 VIEW  Comparison: 12/14/2010.  Findings: Right PICC line tip proximal superior vena cava level.  Interval development of consolidation lung bases greater on the right.  This may represent pleural fluid, atelectasis and / or infiltrate.  Pulmonary vascular congestion/pulmonary edema.  No gross pneumothorax.  Left lateral lung not included present exam.  Mild cardiomegaly.  IMPRESSION: Right PICC line tip proximal superior vena cava level.  Interval development of consolidation lung bases greater on the right.  This may represent pleural fluid, atelectasis and / or infiltrate.  Pulmonary vascular  congestion/pulmonary edema.  Left lateral lung not included present exam.  Original Report Authenticated By: Fuller Canada, M.D.   Dg Chest Port 1 View  12/14/2010  *RADIOLOGY REPORT*  Clinical Data: Respiratory failure.  PORTABLE CHEST - 1 VIEW  Comparison: 12/13/2010  Findings: The patient has increased atelectasis at both lung bases without effusions or lung consolidation.  Heart size and vascularity are normal.  IMPRESSION: Increasing bibasilar atelectasis.  Original Report Authenticated By: Gwynn Burly, M.D.   Dg Chest Portable 1 View  12/13/2010  *RADIOLOGY REPORT*  Clinical Data: Shortness of breath.  Altered mental status.  PORTABLE CHEST - 1 VIEW  Comparison: 04/14/2007  Findings: Low lung volumes are seen.  Mild atelectasis is seen at the right lung base.  Left lung is clear.  Heart size is normal.  IMPRESSION: Low lung volumes and mild right basilar atelectasis.  Original Report Authenticated By: Danae Orleans, M.D.    Procedures: Right arm PICC line  EKG: Sinus tachycardia. Age-indeterminate anterior infarct.  Echocardiogram Left ventricle: The cavity size was normal. Wall thickness was normal except for borderline proximal septal hypertrophy. Systolic function was hyperdynamic. The estimated ejection fraction was in the range of 75% to 80%. There was no dynamic obstruction. Wall motion was normal; there were no regional wall motion abnormalities.   Full Code   Hospital Course: See H&P for complete admission details. The patient is a 50 year old white female who presented with confusion. She is on chronic opiates and benzodiazepines. Her husband had been hospitalized and apparently she had been acting progressively more bizarrely. She was forgetful, and leaving the stove on. She did not answer her door and somebody brought her to the emergency room. She was agitated combative and confused. She required Ativan and Geodon. CT scan of her brain was negative. Her blood  alcohol level was negligible. Her urine drug screen was positive for benzodiazepines. Urinalysis was significant for bacteria and nitrites. ABG revealed hypoxia and hypercarbia. Her transaminases were in the several thousand range. Bilirubin and alkaline phosphatase were not terribly elevated. She was tachycardic in the 110 range and afebrile. Respiratory rate was 23. She was agitated and distracted. Dry mucous membranes. Rales on lung exam with sonorous respirations and periods of apnea when asleep. Obese. No focal neurologic deficits. Ammonia level was 76 and creatinine was 1.62. INR was 1.79. CT of the brain was negative. Chest x-ray showed atelectasis and low lung volumes. Abdominal ultrasound showed prior cholecystectomy, no ductal dilatation, hepatic steatosis. Trace perihepatic ascites and right pleural effusion no hydronephrosis.  Patient was admitted to the intensive care unit and started on broad-spectrum antibiotics. She was started on oxygen and subsequently BiPAP. GI was consulted and discussed potential transfer  to [facility] because of the liver failure. The hepatologist air felt that this was most likely shock liver which would resolve with supportive care and the patient was kept at [facility] intensive care unit. She was started on lactulose retention enemas, IV fluids, sequential compression devices and ulcer prophylaxis.  Patient's liver failure resolved, as did her hyperammonemia. However, she continued to have severe agitated delirium, requiring fairly high dose sedatives. Repeat imaging studies showed no pulmonary embolus but she did have pneumonia. Urine culture grew out Escherichia coli which was pansensitive except for ampicillin. She continued to show evidence of obstructive sleep apnea but does not carry this diagnosis. EEG showed no seizure activity. Her husband reported that she had been out of her medications for several days including Xanax. Her encephalopathy was  most likely multifactorial, secondary to toxic metabolic encephalopathy in the setting of probable benzodiazepine withdrawal, hypoxia, pneumonia, urinary tract infection, hyperammonemia, and ICU psychosis. Because of the prolonged delirium, neurology was consulted and agreed with management. Eventually, her mental status did clear. Today, she is oriented, appropriate. Her vital signs have stabilized. Her pneumonia and urinary tract infection have been treated. I will send her home with a nurse to assess home safety. For the first few days at least, she will need 24-hour supervision. She can followup with Dr. Gerilyn Pilgrim to further evaluate her probable sleep apnea. I recommended a polysomnogram. She should be tapered off her benzodiazepines and for opiates, deferred to her primary care physician. She has been getting benzodiazepines and opiate analgesics only on an as-needed basis for the past several days and is quite stable. Her PICC line will be removed prior to discharge. Total time on the day of discharge is greater than 30 minutes.  Discharge Exam:  Blood pressure 105/63, pulse 107, temperature 98.1 F (36.7 C), temperature source Oral, resp. rate 20, height 5\' 2"  (1.575 m), weight 93.2 kg (205 lb 7.5 oz), last menstrual period 05/17/2009, SpO2 93.00%.  Gen.: Alert, oriented, appropriate. Ambulating about the unit with physical therapy. Lungs: Clear to auscultation bilaterally without wheezes rhonchi or rales Cardiovascular regular rate rhythm without murmurs gallops rubs or Abdomen soft nontender nondistended  extremities no clubbing cyanosis or edema   Signed: Glenora Morocho L 12/24/2010, 1:47 PM

## 2010-12-24 NOTE — Progress Notes (Signed)
NAMEMAYBELL, Candice                 ACCOUNT NO.:  [id number]  MEDICAL RECORD NO.:  [id number]  LOCATION:  A341                          FACILITY:  [facility]  PHYSICIAN:  Raelynn Corron A. Gerilyn Pilgrim, M.D. DATE OF BIRTH:  Apr 13, 1960  DATE OF PROCEDURE: DATE OF DISCHARGE:  12/24/2010                                PROGRESS NOTE   She appears to have improved significantly over the last few days.  PHYSICAL EXAMINATION:  VITAL SIGNS:  Temperature 98.1, which is the T- max, pulse 107, respirations 20, blood pressure 105/63. HEENT EVALUATION:  Neck is supple.  Head is normocephalic and atraumatic. ABDOMEN:  Soft. EXTREMITIES:  No significant edema. MENTATION:  She is awake and alert.  She is significantly improved.  She knows she is in the hospital.  She thinks it is 2013.  At least, she knows that this is the end of 2012.  She does speak in full clear sentences.  She is lucid and coherent.  Cannot give adequate history as to why she is in the hospital, however. CRANIAL EVALUATION:  Pupils are 6 mm and briskly reactive.  Visual fields are intact.  Extraocular movements are full.  Facial muscle strength is symmetric.  Motor examination shows good tone bilaterally.  Labs were reviewed and creatinine continues to improved, now 1.2. Glucose 100.  Medications are reviewed.  She is now on Levaquin and azithromycin.  The Zosyn and vancomycin were discontinued.  ASSESSMENT AND PLAN:  Multifactorial toxic-metabolic encephalopathy. She is much improved.  We will go ahead and sign off, reconsult as needed.     Treyden Hakim A. Gerilyn Pilgrim, M.D.     KAD/MEDQ  D:  12/24/2010  T:  12/24/2010  Job:  [id number]

## 2010-12-24 NOTE — Progress Notes (Signed)
Subjective: Interval History:   Objective: Vital signs in last 24 hours: Temp:  [98.1 F (36.7 C)] 98.1 F (36.7 C) (12/03 0600) Pulse Rate:  [88-107] 107  (12/03 0600) Resp:  [18-20] 20  (12/03 0600) BP: (105-127)/(63-75) 105/63 mmHg (12/03 0600) SpO2:  [94 %-96 %] 95 % (12/03 0600)  Intake/Output from previous day: 12/02 0701 - 12/03 0700 In: 680 [P.O.:680] Out: 300 [Urine:300] Intake/Output this shift:   Nutritional status: Dysphagia   Lab Results:  Basename 12/24/10 0436 12/23/10 0730  WBC -- 10.0  HGB -- 11.9*  HCT -- 37.7  PLT -- 287  NA 141 138  K 3.8 3.9  CL 104 103  CO2 29 28  GLUCOSE 111* 175*  BUN 10 14  CREATININE 1.20* 1.16*  CALCIUM 9.1 8.7  LABA1C -- --   Lipid Panel No results found for this basename: CHOL,TRIG,HDL,CHOLHDL,VLDL,LDLCALC in the last 72 hours  Studies/Results: No results found.  Medications:   Scheduled Meds:   . albuterol  2.5 mg Nebulization BID  . antiseptic oral rinse  15 mL Mouth Rinse Q4H  . azithromycin  500 mg Intravenous Q24H  . haloperidol lactate  2 mg Intravenous TID  . ipratropium  0.5 mg Nebulization BID  . levofloxacin (LEVAQUIN) IV  750 mg Intravenous Q24H  . levothyroxine  100 mcg Oral QAC breakfast  . nicotine  21 mg Transdermal Daily  . pantoprazole (PROTONIX) IV  40 mg Intravenous Q24H  . sodium chloride  10 mL Intracatheter Q12H  . sodium chloride      . DISCONTD: albuterol  2.5 mg Nebulization QID  . DISCONTD: insulin aspart  0-15 Units Subcutaneous Q4H  . DISCONTD: insulin glargine  12 Units Subcutaneous QHS  . DISCONTD: ipratropium  0.5 mg Nebulization QID  . DISCONTD: levothyroxine  76 mcg Intravenous Daily  . DISCONTD: piperacillin-tazobactam (ZOSYN)  IV  3.375 g Intravenous Q8H  . DISCONTD: vancomycin  1,000 mg Intravenous Q12H   Continuous Infusions:   . dextrose 5 % with KCl 20 mEq / L 20 mEq (12/23/10 1619)  . DISCONTD: dextrose 5 % with kcl 20 mL/hr at 12/22/10 1400  . DISCONTD: TPN  (CLINIMIX) +/- additives 65 mL/hr at 12/22/10 1806  . DISCONTD: TPN (CLINIMIX) +/- additives    . DISCONTD: TPN (CLINIMIX) +/- additives     PRN Meds:.acetaminophen, albuterol, LORazepam, ondansetron (ZOFRAN) IV, sodium chloride, traMADol   Assessment/Plan: See dictation   LOS: 11 days   [name]

## 2010-12-24 NOTE — Progress Notes (Signed)
Pt PICC removed from right upper arm. 38 cm in length. Vaseline guaze dressing applied. No bleeding noted. Pt tolerated procedure well. Pt verbalized understanding of post picc instructions.

## 2010-12-24 NOTE — Progress Notes (Signed)
Physical Therapy Evaluation Patient Details Name: [name] MRN: [id number] DOB: 01/23/1960 Today's Date: 12/24/2010  Problem List:  Patient Active Problem List  Diagnoses  . HYPOTHYROIDISM, UNSPECIFIED  . HYPERCHOLESTEROLEMIA  . OBESITY, NOS  . DEPRESSION, MAJOR, RECURRENT  . ANXIETY  . SMOKER  . HYPERTENSION, BENIGN SYSTEMIC  . PEPTIC ULCER DIS., UNSPEC. W/O OBSTRUCTION  . Chronic kidney disease (CKD), stage III (moderate)  . NEPHROLITHIASIS  . INCONTINENCE, STRESS, FEMALE  . RHEUMATOID ARTHRITIS (NOT JUVENILE)  . Ankylosing spondylitis  . OSTEOPOROSIS, UNSPECIFIED  . SCOLIOSIS  . INSOMNIA NOS  . EDEMA  . LOSS OF APPETITE  . HEADACHE  . ASCUS PAP  . Vitamin D deficiency  . Nonallopathic lesion of cervicothoracic region  . Abnormal ECG  . Otitis externa  . Encephalopathy acute  . Acute hepatitis  . Acute respiratory failure with hypercapnia  . UTI (lower urinary tract infection)  . ARF (acute renal failure)  . CKD (chronic kidney disease), stage III  . Macrocytosis  . Hypothyroidism  . Coagulopathy  . Hypernatremia  . Hypokalemia  . Drug-induced hyperglycemia    Past Medical History:  Past Medical History  Diagnosis Date  . Allergy   . Arthritis   . Depression   . GERD (gastroesophageal reflux disease)   . Hyperlipidemia   . Hypertension   . CKD (chronic kidney disease) stage 3, GFR 30-59 ml/min   . Osteoporosis   . Hypothyroidism   . Anxiety   . Chronic pain syndrome   . Tobacco abuse   . Obesity   . Gastroparesis   . Ankylosing spondylitis   . Nephrolithiasis    Past Surgical History:  Past Surgical History  Procedure Date  . Total knee arthroplasty     left  . Back surgery   . Neck surgery   . Appendectomy   . Cholecystectomy   . Right knee arthroplasty     PT Assessment/Plan/Recommendation PT Assessment Clinical Impression Statement: pt appears to be close to functional baseline...no anticipated PT needs or DME needs PT  Recommendation/Assessment: Patent does not need any further PT services No Skilled PT: All education completed;Patient at baseline level of functioning PT Recommendation Follow Up Recommendations: None Equipment Recommended: None recommended by PT PT Goals     PT Evaluation Precautions/Restrictions  Precautions Required Braces or Orthoses: No Restrictions Weight Bearing Restrictions: No Prior Functioning  Home Living Lives With: Spouse Receives Help From: Family Type of Home: House Home Layout: One level Home Access: Stairs to enter Entrance Stairs-Rails: Right;Left;Can reach both Entrance Stairs-Number of Steps: 4 Home Adaptive Equipment: Walker - rolling;Quad cane Prior Function Level of Independence: Independent with basic ADLs;Independent with gait;Requires assistive device for independence;Independent with transfers;Independent with homemaking with ambulation Vocation: On disability Cognition Cognition Arousal/Alertness: Awake/alert Overall Cognitive Status: Appears within functional limits for tasks assessed Orientation Level: Oriented to person;Oriented to place Sensation/Coordination Sensation Light Touch: Appears Intact Stereognosis: Not tested Hot/Cold: Not tested Proprioception: Appears Intact Coordination Gross Motor Movements are Fluid and Coordinated: Yes Fine Motor Movements are Fluid and Coordinated: Not tested Extremity Assessment RUE Assessment RUE Assessment: Within Functional Limits LUE Assessment LUE Assessment: Within Functional Limits RLE Assessment RLE Assessment: Within Functional Limits LLE Assessment LLE Assessment: Within Functional Limits Mobility (including Balance) Bed Mobility Bed Mobility: Yes (independent) Transfers Transfers: Yes (independent) Ambulation/Gait Ambulation/Gait: Yes Ambulation/Gait Assistance: 6: Modified independent (Device/Increase time) Ambulation Distance (Feet): 150 Feet Assistive device: Large base quad  cane Gait Pattern: Within Functional Limits Gait velocity: WNL Stairs:  Yes Stairs Assistance: 6: Modified independent (Device/Increase time) Stair Management Technique: One rail Right;Forwards;Step to pattern Number of Stairs: 4  Wheelchair Mobility Wheelchair Mobility: No  Posture/Postural Control Posture/Postural Control: No significant limitations Balance Balance Assessed: Yes (WNL) Exercise    End of Session PT - End of Session Equipment Utilized During Treatment: Gait belt Activity Tolerance: Patient tolerated treatment well Patient left: in bed;with call bell in reach;with bed alarm set Nurse Communication: Mobility status for transfers;Mobility status for ambulation General Behavior During Session: [facility] for tasks performed Cognition: [facility] for tasks performed  Konrad Penta 12/24/2010, 11:12 AM

## 2010-12-24 NOTE — Progress Notes (Signed)
Speech Language/Pathology Speech Language Pathology Treatment Patient Details Name: [name] MRN: [id number] DOB: 1960-12-26 Today's Date: 12/24/2010  SLP Assessment/Plan/Recommendation [name] was interviewed at bedside just prior to discharge home from hospital. She is no longer having any difficulties swallowing solid foods except for some difficulty masticating secondary to edentulous status. She was upgraded to mechanical soft textures over the week end and tolerated without incident.  No further SLP services indicated at this time.    [name] 12/24/2010, 3:08 PM

## 2010-12-24 NOTE — Progress Notes (Signed)
Pt discharged with instructions.  She verbalizes understanding.  She left the floor via w/c with staff in stable condition.

## 2011-01-02 ENCOUNTER — Ambulatory Visit: Payer: BC Managed Care – PPO | Admitting: Family Medicine

## 2011-01-03 ENCOUNTER — Ambulatory Visit: Payer: BC Managed Care – PPO | Admitting: Family Medicine

## 2011-01-05 ENCOUNTER — Other Ambulatory Visit: Payer: Self-pay | Admitting: Family Medicine

## 2011-01-07 ENCOUNTER — Telehealth: Payer: Self-pay | Admitting: Family Medicine

## 2011-01-07 NOTE — Telephone Encounter (Signed)
[name] is coming in next week for HFU but she thinks she has a UTI and would like an antibiotic sent [facility] in [place].  I did make her aware that she needs to be seen first and is hoping that Dr. Katrinka Blazing will be willing to call it in without seeing her.

## 2011-01-07 NOTE — Telephone Encounter (Signed)
To MD, but I do think he will want to see her first. Fleeger, Maryjo [place]

## 2011-01-07 NOTE — Telephone Encounter (Signed)
Refill request

## 2011-01-08 ENCOUNTER — Encounter (HOSPITAL_COMMUNITY): Payer: Self-pay | Admitting: Emergency Medicine

## 2011-01-08 ENCOUNTER — Emergency Department (HOSPITAL_COMMUNITY)
Admission: EM | Admit: 2011-01-08 | Discharge: 2011-01-09 | Discharge status: Against Medical Advice | Payer: BC Managed Care – PPO | Attending: Emergency Medicine | Admitting: Emergency Medicine

## 2011-01-08 DIAGNOSIS — R109 Unspecified abdominal pain: Secondary | ICD-10-CM | POA: Insufficient documentation

## 2011-01-08 NOTE — Telephone Encounter (Signed)
I agree I would need to see patient first before giving antibiotics.

## 2011-01-08 NOTE — ED Notes (Signed)
PT. REPORTS RIGHT FLANK PIAN ONSET LAST WEEK , DENIES DYSURIA OR HEMATURIA.

## 2011-01-08 NOTE — Telephone Encounter (Signed)
LMOVM informing her that appt needs to be made. Matalynn Graff, Maryjo [place]

## 2011-01-09 ENCOUNTER — Encounter (HOSPITAL_COMMUNITY): Payer: Self-pay | Admitting: *Deleted

## 2011-01-09 ENCOUNTER — Other Ambulatory Visit: Payer: Self-pay

## 2011-01-09 ENCOUNTER — Emergency Department (HOSPITAL_COMMUNITY)
Admission: EM | Admit: 2011-01-09 | Discharge: 2011-01-10 | Disposition: A | Discharge status: Home / Self Care | Payer: BC Managed Care – PPO | Attending: Emergency Medicine | Admitting: Emergency Medicine

## 2011-01-09 DIAGNOSIS — Z136 Encounter for screening for cardiovascular disorders: Secondary | ICD-10-CM | POA: Insufficient documentation

## 2011-01-09 DIAGNOSIS — Z7982 Long term (current) use of aspirin: Secondary | ICD-10-CM | POA: Insufficient documentation

## 2011-01-09 DIAGNOSIS — F29 Unspecified psychosis not due to a substance or known physiological condition: Secondary | ICD-10-CM | POA: Insufficient documentation

## 2011-01-09 DIAGNOSIS — R4789 Other speech disturbances: Secondary | ICD-10-CM | POA: Insufficient documentation

## 2011-01-09 DIAGNOSIS — R059 Cough, unspecified: Secondary | ICD-10-CM | POA: Insufficient documentation

## 2011-01-09 DIAGNOSIS — G894 Chronic pain syndrome: Secondary | ICD-10-CM | POA: Insufficient documentation

## 2011-01-09 DIAGNOSIS — N189 Chronic kidney disease, unspecified: Secondary | ICD-10-CM | POA: Insufficient documentation

## 2011-01-09 DIAGNOSIS — R404 Transient alteration of awareness: Secondary | ICD-10-CM | POA: Insufficient documentation

## 2011-01-09 DIAGNOSIS — M549 Dorsalgia, unspecified: Secondary | ICD-10-CM | POA: Insufficient documentation

## 2011-01-09 DIAGNOSIS — F329 Major depressive disorder, single episode, unspecified: Secondary | ICD-10-CM | POA: Insufficient documentation

## 2011-01-09 DIAGNOSIS — E785 Hyperlipidemia, unspecified: Secondary | ICD-10-CM | POA: Insufficient documentation

## 2011-01-09 DIAGNOSIS — K219 Gastro-esophageal reflux disease without esophagitis: Secondary | ICD-10-CM | POA: Insufficient documentation

## 2011-01-09 DIAGNOSIS — F17200 Nicotine dependence, unspecified, uncomplicated: Secondary | ICD-10-CM | POA: Insufficient documentation

## 2011-01-09 DIAGNOSIS — R4182 Altered mental status, unspecified: Secondary | ICD-10-CM | POA: Insufficient documentation

## 2011-01-09 DIAGNOSIS — F3289 Other specified depressive episodes: Secondary | ICD-10-CM | POA: Insufficient documentation

## 2011-01-09 DIAGNOSIS — M810 Age-related osteoporosis without current pathological fracture: Secondary | ICD-10-CM | POA: Insufficient documentation

## 2011-01-09 DIAGNOSIS — Z87442 Personal history of urinary calculi: Secondary | ICD-10-CM | POA: Insufficient documentation

## 2011-01-09 DIAGNOSIS — R05 Cough: Secondary | ICD-10-CM | POA: Insufficient documentation

## 2011-01-09 DIAGNOSIS — F411 Generalized anxiety disorder: Secondary | ICD-10-CM | POA: Insufficient documentation

## 2011-01-09 DIAGNOSIS — I129 Hypertensive chronic kidney disease with stage 1 through stage 4 chronic kidney disease, or unspecified chronic kidney disease: Secondary | ICD-10-CM | POA: Insufficient documentation

## 2011-01-09 LAB — RAPID URINE DRUG SCREEN, HOSP PERFORMED
Barbiturates: NOT DETECTED
Cocaine: NOT DETECTED

## 2011-01-09 LAB — CBC
HCT: 40.1 % (ref 36.0–46.0)
Hemoglobin: 12.7 g/dL (ref 12.0–15.0)
RBC: 3.89 MIL/uL (ref 3.87–5.11)
WBC: 8 10*3/uL (ref 4.0–10.5)

## 2011-01-09 LAB — DIFFERENTIAL
Eosinophils Absolute: 0.2 10*3/uL (ref 0.0–0.7)
Eosinophils Relative: 2 % (ref 0–5)
Lymphocytes Relative: 35 % (ref 12–46)
Lymphs Abs: 2.8 10*3/uL (ref 0.7–4.0)
Monocytes Absolute: 0.4 10*3/uL (ref 0.1–1.0)
Monocytes Relative: 5 % (ref 3–12)

## 2011-01-09 NOTE — ED Notes (Signed)
Pt husband reports that last night patient started with some delusions and increased confusion. Pt husband states that the patient would not remember where she was and not remembering her son's birthday nor her anniversary. Pt husband also reports that the patient "stated that she was going to the gas station and went to lay down in her bed." When patient's husband asked her where she was she answered "I'm at the service station getting gas." Husband reports another incident of the patient spending an hour smoking a cigarette last night that she never lite. pt alert and oriented x 3. Pt unable to state the correct day of the week or the date. Pt states she was hospitalized over Thanksgiving and discharged around December 3 and was treated for an infection. Pt states that she has had some burning on urination over the last couple days. Pt states that she has an appointment with Dr. Katrinka Blazing in [place] tomorrow at 3:15p. Pt has no complaints at this time.

## 2011-01-09 NOTE — ED Notes (Signed)
MD at bedside. 

## 2011-01-09 NOTE — ED Notes (Signed)
Recently hospitalized with liver disease, confused for the past 2 days

## 2011-01-09 NOTE — ED Provider Notes (Signed)
Scribed for Candice Gaskins, MD, the patient was seen in room APA05/AP[facility]05 . This chart was scribed by Ellie Lunch.   CSN: [id number] Arrival date & time: 01/09/2011 10:05 PM   First MD Initiated Contact with Patient 01/09/11 2254      Chief Complaint  Patient presents with  . Altered Mental Status     Patient is a 50 y.o. female presenting with altered mental status. The history is provided by the patient, the spouse and medical records. No language interpreter was used.  Altered Mental Status This is a recurrent problem. The current episode started more than 1 week ago. The problem occurs constantly. The problem has been gradually worsening (worsened today). The symptoms are aggravated by nothing. The symptoms are relieved by nothing. The treatment provided no relief.  [name] is a 50 y.o. female who presents to the Emergency Department complaining of altered mental status. Pt c/o 3 weeks of intermittent AMS since admission to hospital for encephalopathy and liver dysfunction. AMS is associated with confusion, speech difficulty , drowsiness, and forgetfulness. Per husband AMS became worse yesterday morning. Pt denies focal weakness, falls, syncope, pain, CP, abdominal pain or vision changes.  Pt c/o some cough and back pain. Pt reports feeling improved now, but c/o some memory loss such as her son and husband's birthdays.   PCP Dr. Katrinka Blazing Planned follow up Tomorrow.   Past Medical History  Diagnosis Date  . Allergy   . Arthritis   . Depression   . GERD (gastroesophageal reflux disease)   . Hyperlipidemia   . Hypertension   . CKD (chronic kidney disease) stage 3, GFR 30-59 ml/min   . Osteoporosis   . Hypothyroidism   . Anxiety   . Chronic pain syndrome   . Tobacco abuse   . Obesity   . Gastroparesis   . Ankylosing spondylitis   . Nephrolithiasis     Past Surgical History  Procedure Date  . Total knee arthroplasty     left  . Back surgery   . Neck surgery   .  Appendectomy   . Cholecystectomy   . Right knee arthroplasty     Family History  Problem Relation Age of Onset  . Diabetes Mother   . Heart failure Mother   . Hyperlipidemia Mother   . Hypertension Mother   . Hyperlipidemia Father   . Cancer Son     lymphoma stage 4 at 30    History  Substance Use Topics  . Smoking status: Current Everyday Smoker -- 0.5 packs/day    Types: Cigarettes  . Smokeless tobacco: Not on file  . Alcohol Use: No    Review of Systems  Psychiatric/Behavioral: Positive for confusion and altered mental status.  All other systems reviewed and are negative.   Allergies  Codeine and Latex  Home Medications   Current Outpatient Rx  Name Route Sig Dispense Refill  . ASPIRIN EC 81 MG PO TBEC Oral Take 81 mg by mouth daily.      Marland Kitchen VITAMIN D-3 5000 UNITS PO TABS Oral Take 1 capsule by mouth daily.      Marland Kitchen CITALOPRAM HYDROBROMIDE 20 MG PO TABS Oral Take 1 tablet (20 mg total) by mouth every morning. 180 tablet 1  . CYCLOBENZAPRINE HCL 10 MG PO TABS Oral Take 1 tablet (10 mg total) by mouth 3 times/day as needed-between meals & bedtime for muscle spasms. 30 tablet   . DOCUSATE SODIUM 100 MG PO CAPS Oral Take 100 mg  by mouth 2 (two) times daily. States she takes every day per patient    . GABAPENTIN 100 MG PO CAPS Oral Take 200 mg by mouth 2 (two) times daily. Take 2 tabs by mouth two times a day    . LANSOPRAZOLE 30 MG PO CPDR  TAKE 1 CAPSULE BY MOUTH EVERY DAY 90 capsule 0  . LEVOTHYROXINE SODIUM 100 MCG PO TABS  TAKE 1 TABLET BY MOUTH EVERY DAY 90 tablet 0  . METOCLOPRAMIDE HCL 5 MG PO TABS Oral Take 5 mg by mouth 4 (four) times daily.     Marland Kitchen NORTRIPTYLINE HCL 25 MG PO CAPS  TAKE 1 CAPSULE BY MOUTH EVERY NIGHT AT BEDTIME 90 capsule 0  . OXYCODONE HCL ER 20 MG PO TB12 Oral Take 20 mg by mouth every 12 (twelve) hours.      Marland Kitchen PRAVASTATIN SODIUM 20 MG PO TABS Oral Take 20 mg by mouth Daily.    Marland Kitchen LOPERAMIDE HCL 2 MG PO CAPS Oral Take 2 mg by mouth 4 (four) times  daily as needed. Diarrhea       BP 132/86  Pulse 98  Temp(Src) 97.7 F (36.5 C) (Oral)  Resp 20  Ht 5\' 3"  (1.6 m)  Wt 196 lb 9 oz (89.16 kg)  BMI 34.82 kg/m2  SpO2 97%  LMP 05/17/2009  Physical Exam CONSTITUTIONAL: Well developed/well nourished HEAD AND FACE: Normocephalic/atraumatic EYES: EOMI/PERRL ENMT: Mucous membranes moist NECK: supple no meningeal signs SPINE:entire spine nontender CV: S1/S2 noted, no murmurs/rubs/gallops noted LUNGS: Expiratory wheezing bilaterally, no apparent distress ABDOMEN: soft, nontender, no rebound or guarding GU:no cva tenderness NEURO: Pt is awake/alert, moves all extremitiesx4. Gait normal, No arm or leg drift, face is symmetric, mild tremor both hands, alert and oriented to person place but confused on time (chronic) EXTREMITIES: pulses normal, full ROM SKIN: warm, color normal PSYCH: no abnormalities of mood noted  ED Course  Procedures  DIAGNOSTIC STUDIES: Oxygen Saturation is 97% on room air, normal by my interpretation.    COORDINATION OF CARE:  11:13 PM Records reviewed.  Recent admission for multifactorial encephalopathy and liver dysfunction that resolved.    Pt stable in the ED.  No distress, ambulatory.  Husband feels comfortable taking home and f/u with PCP tomorrow.  This apparently has been intermittent since recent hospital admission.  No abrupt cessation of pain meds/benzos She does not consume ETOH Stable for d/c and close followup  Labs Reviewed  CBC  DIFFERENTIAL  COMPREHENSIVE METABOLIC PANEL  URINALYSIS, ROUTINE W REFLEX MICROSCOPIC  URINE RAPID DRUG SCREEN (HOSP PERFORMED)  AMMONIA   Results for orders placed during the hospital encounter of 01/09/11  CBC      Component Value Range   WBC 8.0  4.0 - 10.5 (K/uL)   RBC 3.89  3.87 - 5.11 (MIL/uL)   Hemoglobin 12.7  12.0 - 15.0 (g/dL)   HCT 16.1  09.6 - 04.5 (%)   MCV 103.1 (*) 78.0 - 100.0 (fL)   MCH 32.6  26.0 - 34.0 (pg)   MCHC 31.7  30.0 - 36.0 (g/dL)    RDW 40.9 (*) 81.1 - 15.5 (%)   Platelets 206  150 - 400 (K/uL)  DIFFERENTIAL      Component Value Range   Neutrophils Relative 58  43 - 77 (%)   Neutro Abs 4.6  1.7 - 7.7 (K/uL)   Lymphocytes Relative 35  12 - 46 (%)   Lymphs Abs 2.8  0.7 - 4.0 (K/uL)   Monocytes Relative  5  3 - 12 (%)   Monocytes Absolute 0.4  0.1 - 1.0 (K/uL)   Eosinophils Relative 2  0 - 5 (%)   Eosinophils Absolute 0.2  0.0 - 0.7 (K/uL)   Basophils Relative 0  0 - 1 (%)   Basophils Absolute 0.0  0.0 - 0.1 (K/uL)  COMPREHENSIVE METABOLIC PANEL      Component Value Range   Sodium 137  135 - 145 (mEq/L)   Potassium 3.3 (*) 3.5 - 5.1 (mEq/L)   Chloride 95 (*) 96 - 112 (mEq/L)   CO2 36 (*) 19 - 32 (mEq/L)   Glucose, Bld 115 (*) 70 - 99 (mg/dL)   BUN 8  6 - 23 (mg/dL)   Creatinine, Ser 5.78 (*) 0.50 - 1.10 (mg/dL)   Calcium 8.9  8.4 - 46.9 (mg/dL)   Total Protein 6.3  6.0 - 8.3 (g/dL)   Albumin 2.5 (*) 3.5 - 5.2 (g/dL)   AST 19  0 - 37 (U/L)   ALT 10  0 - 35 (U/L)   Alkaline Phosphatase 122 (*) 39 - 117 (U/L)   Total Bilirubin 0.3  0.3 - 1.2 (mg/dL)   GFR calc non Af Amer 41 (*) >90 (mL/min)   GFR calc Af Amer 47 (*) >90 (mL/min)  URINALYSIS, ROUTINE W REFLEX MICROSCOPIC      Component Value Range   Color, Urine YELLOW  YELLOW    APPearance CLEAR  CLEAR    Specific Gravity, Urine <1.005 (*) 1.005 - 1.030    pH 6.5  5.0 - 8.0    Glucose, UA NEGATIVE  NEGATIVE (mg/dL)   Hgb urine dipstick TRACE (*) NEGATIVE    Bilirubin Urine NEGATIVE  NEGATIVE    Ketones, ur NEGATIVE  NEGATIVE (mg/dL)   Protein, ur NEGATIVE  NEGATIVE (mg/dL)   Urobilinogen, UA 0.2  0.0 - 1.0 (mg/dL)   Nitrite NEGATIVE  NEGATIVE    Leukocytes, UA NEGATIVE  NEGATIVE   URINE RAPID DRUG SCREEN (HOSP PERFORMED)      Component Value Range   Opiates NONE DETECTED  NONE DETECTED    Cocaine NONE DETECTED  NONE DETECTED    Benzodiazepines NONE DETECTED  NONE DETECTED    Amphetamines NONE DETECTED  NONE DETECTED    Tetrahydrocannabinol NONE  DETECTED  NONE DETECTED    Barbiturates NONE DETECTED  NONE DETECTED   AMMONIA      Component Value Range   Ammonia 15  11 - 60 (umol/L)  URINE MICROSCOPIC-ADD ON      Component Value Range   Squamous Epithelial / LPF FEW (*) RARE    WBC, UA 0-2  <3 (WBC/hpf)   RBC / HPF 0-2  <3 (RBC/hpf)   Bacteria, UA RARE  RARE     MDM  Nursing notes reviewed and considered in documentation All labs/vitals reviewed and considered Previous records reviewed and considered     Date: 01/09/2011  Rate: 91  Rhythm: normal sinus rhythm  QRS Axis: normal  Intervals: normal  ST/T Wave abnormalities: nonspecific ST changes  Conduction Disutrbances:none  Narrative Interpretation:   Old EKG Reviewed: unchanged poor R wave progression noted    I personally performed the services described in this documentation, which was scribed in my presence. The recorded information has been reviewed and considered.           Candice Gaskins, MD 01/10/11 [phone]

## 2011-01-10 ENCOUNTER — Encounter: Payer: Self-pay | Admitting: Family Medicine

## 2011-01-10 ENCOUNTER — Ambulatory Visit (INDEPENDENT_AMBULATORY_CARE_PROVIDER_SITE_OTHER): Payer: BC Managed Care – PPO | Admitting: Family Medicine

## 2011-01-10 DIAGNOSIS — E039 Hypothyroidism, unspecified: Secondary | ICD-10-CM

## 2011-01-10 DIAGNOSIS — G9340 Encephalopathy, unspecified: Secondary | ICD-10-CM

## 2011-01-10 LAB — COMPREHENSIVE METABOLIC PANEL
ALT: 10 U/L (ref 0–35)
BUN: 8 mg/dL (ref 6–23)
CO2: 36 mEq/L — ABNORMAL HIGH (ref 19–32)
Calcium: 8.9 mg/dL (ref 8.4–10.5)
Creatinine, Ser: 1.46 mg/dL — ABNORMAL HIGH (ref 0.50–1.10)
GFR calc Af Amer: 47 mL/min — ABNORMAL LOW (ref >90)
GFR calc non Af Amer: 41 mL/min — ABNORMAL LOW (ref >90)
Glucose, Bld: 115 mg/dL — ABNORMAL HIGH (ref 70–99)

## 2011-01-10 LAB — PROTIME-INR
INR: 1.03 (ref <1.50)
Prothrombin Time: 13.9 seconds (ref 11.6–15.2)

## 2011-01-10 LAB — URINALYSIS, ROUTINE W REFLEX MICROSCOPIC
Bilirubin Urine: NEGATIVE
Leukocytes, UA: NEGATIVE
Nitrite: NEGATIVE
Specific Gravity, Urine: <1.005 — ABNORMAL LOW (ref 1.005–1.030)
Urobilinogen, UA: 0.2 mg/dL (ref 0.0–1.0)

## 2011-01-10 LAB — URINE MICROSCOPIC-ADD ON

## 2011-01-10 LAB — AMMONIA: Ammonia: 15 umol/L (ref 11–60)

## 2011-01-10 LAB — T4, FREE: Free T4: 1.28 ng/dL (ref 0.80–1.80)

## 2011-01-10 NOTE — Patient Instructions (Signed)
Please use a pillbox to separate out your pills. Do not take any supplements Please come back in 2 weeks to follow up with Dr. Katrinka Blazing to make sure you're getting better Bring every single medicine that you had at home that you're taking We will send you to a GI doctor to follow up on your liver

## 2011-01-10 NOTE — Assessment & Plan Note (Signed)
Carefully reviewed records from hospitalization. Patient had a thorough workup that did not explain the source of her hepatic failure. She is approaching her baseline mental status. I asked her husband to please monitor her medication intake very closely and make a pillbox so she can not make mistakes regarding her medicines. I will send her to GI doctor for further evaluation of her liver function. Today I checked an ammonia level, CMP, and her thyroid function since that was low at last check

## 2011-01-10 NOTE — Progress Notes (Signed)
  Subjective:    Patient ID: [name], female    DOB: 11-27-1960, 50 y.o.   MRN: [id number]  HPI Patient was hospitalized in [place] for liver failure progressing to respiratory failure. She was discharged at the end of last month. Since that time she's been getting better and approaching her normal mental state.  2 days ago, her husband had noticed that she seemed to be getting back into her previous"weird"mental state. He took her to the hospital last night where she was evaluated for this. They did not find any laboratory changes, so they asked her to followup with her PCP today. During her hospitalization, she had a negative acute hepatitis panel, negative autoimmune workup. She and her husband both state that she did not overdose on any of her medications and her laboratory evaluation from her hospital admission seems to back this up with the levels that they thought. Her husband states that he reviewed all for pill bottles and they had the correct number of pills in them. For the last 2 days he states that she is again being forgetful and distracted. He does not think she is taking any medications. However, since she has been watching her medications more closely over the last 2 days, she is getting back to baseline again. He thinks today she is almost at her normal state. Patient denies any medication abuse, any alcohol use, any supplements, or any other drug use. Patient states that she is feeling better today and back to baseline.   Review of Systems Denies headaches, shortness of breath, chest pain    Objective:   Physical Exam  Vital signs reviewed General appearance - alert, well appearing, and in no distress and oriented to person, place, and time Heart - normal rate, regular rhythm, normal S1, S2, no murmurs, rubs, clicks or gallops Chest - clear to auscultation, no wheezes, rales or rhonchi, symmetric air entry, no tachypnea, retractions or cyanosis Neuro- 2 beats of nystagmus  on leftward gaze. Cranial nerves 2-12 intact, normal strength all 4 extremities. Gait is normal.      Assessment & Plan:

## 2011-01-11 LAB — COMPREHENSIVE METABOLIC PANEL
CO2: 32 mEq/L (ref 19–32)
Creat: 1.27 mg/dL — ABNORMAL HIGH (ref 0.50–1.10)
Glucose, Bld: 102 mg/dL — ABNORMAL HIGH (ref 70–99)
Total Bilirubin: 0.5 mg/dL (ref 0.3–1.2)

## 2011-01-11 NOTE — Telephone Encounter (Signed)
Pt came in yesterday and saw Dr. Hulen Luster, no urine given.  Pt states she had a UA in the hospital the other day.  Wants to know if Dr, Katrinka Blazing can take a look at that.  TO MD. Milas Gain, Maryjo [place]

## 2011-01-12 ENCOUNTER — Telehealth: Payer: Self-pay | Admitting: Family Medicine

## 2011-01-12 NOTE — Telephone Encounter (Signed)
Opened in error

## 2011-01-13 NOTE — Telephone Encounter (Signed)
Appears normal seems to be resolving please call pt and tell her.  If you can.

## 2011-01-16 ENCOUNTER — Ambulatory Visit: Payer: BC Managed Care – PPO | Admitting: Family Medicine

## 2011-01-16 NOTE — Telephone Encounter (Signed)
LMOVM informing pt of below and asked to make an appt is still concerned. [name], [name]

## 2011-01-24 ENCOUNTER — Ambulatory Visit (INDEPENDENT_AMBULATORY_CARE_PROVIDER_SITE_OTHER): Payer: BC Managed Care – PPO | Admitting: Family Medicine

## 2011-01-24 ENCOUNTER — Encounter: Payer: Self-pay | Admitting: Family Medicine

## 2011-01-24 VITALS — BP 98/67 | HR 128 | Temp 98.4°F | Ht 63.0 in | Wt 188.0 lb

## 2011-01-24 DIAGNOSIS — F339 Major depressive disorder, recurrent, unspecified: Secondary | ICD-10-CM

## 2011-01-24 DIAGNOSIS — E039 Hypothyroidism, unspecified: Secondary | ICD-10-CM

## 2011-01-24 DIAGNOSIS — G9340 Encephalopathy, unspecified: Secondary | ICD-10-CM

## 2011-01-24 MED ORDER — LEVOTHYROXINE SODIUM 75 MCG PO TABS: 75.0000 ug | ORAL_TABLET | Freq: Every day | ORAL | Status: DC

## 2011-01-24 MED ORDER — CITALOPRAM HYDROBROMIDE 40 MG PO TABS: 40.0000 mg | ORAL_TABLET | ORAL | Status: DC

## 2011-01-24 MED ORDER — CYCLOBENZAPRINE HCL 10 MG PO TABS: 10.0000 mg | ORAL_TABLET | Freq: Two times a day (BID) | ORAL | Status: DC | PRN

## 2011-01-24 NOTE — Progress Notes (Signed)
  Subjective:    Patient ID: [name], female    DOB: [birth year], 51 y.o.   MRN: [id number]  HPI Patient here for followup of her hospitalization. She has not had any further difficulties. Her husband is still distributing her medications. She has not seen Dr. Dulce Sellar yet as she has not called his office to resolve her billing concern. She denies any pain, nausea, vomiting. She complains of chronic constipation.  Reviewed labs with patient. Synthroid needs to be increased slightly. Review of Systems See above    Objective:   Physical Exam  Vital signs reviewed General appearance - alert, well appearing, and in no distress and oriented to person, place, and time Heart - normal rate, regular rhythm, normal S1, S2, no murmurs, rubs, clicks or gallops Chest - clear to auscultation, no wheezes, rales or rhonchi, symmetric air entry, no tachypnea, retractions or cyanosis Neurological - alert, oriented, normal speech, no focal findings or movement disorder noted, screening mental status exam normal, neck supple without rigidity, cranial nerves II through XII intact       Assessment & Plan:

## 2011-01-24 NOTE — Assessment & Plan Note (Signed)
Result, no recurrence. Plan to see Dr. Dulce Sellar soon

## 2011-01-24 NOTE — Assessment & Plan Note (Signed)
Increased dose to 75 mcg

## 2011-01-24 NOTE — Assessment & Plan Note (Signed)
Will increase her Celexa to 40 mg as was previously planned. Her husband to watch her carefully to make sure this does not have any ill effects

## 2011-01-24 NOTE — Patient Instructions (Signed)
Please call Dr. Hulen Shouts office to get the billing situation straightened out  Please make an appointment to see Dr. Katrinka Blazing in about a month to check in on how you are doing Please increase her Celexa to 2 tablets every day If you do well with this, we can increase you to a 40 mg tablet I have increased her Synthroid to 1-1/2 tablets or 75 mcg Please go and pick up a new prescription.

## 2011-02-06 ENCOUNTER — Telehealth: Payer: Self-pay | Admitting: Family Medicine

## 2011-02-06 NOTE — Telephone Encounter (Signed)
Pt had stopped taking xanax and now her heart is racing and she feels like she needs to start taking it again (as needed).  Wanted to let the doctor know.

## 2011-02-06 NOTE — Telephone Encounter (Signed)
Patient is doing much better we'll continue her Xanax as needed. We'll not make a prescription refill until I see patient again.

## 2011-02-14 ENCOUNTER — Other Ambulatory Visit: Payer: Self-pay | Admitting: Family Medicine

## 2011-02-14 MED ORDER — PREDNISONE 20 MG PO TABS: 20.0000 mg | ORAL_TABLET | Freq: Every day | ORAL | Status: AC

## 2011-02-14 NOTE — Telephone Encounter (Signed)
Called patient back told them to a five-day burst of prednisone to see if this would help. If she's not better in the her she needs to be seen.

## 2011-02-14 NOTE — Telephone Encounter (Signed)
Patient is calling to let Dr. Katrinka Blazing know that she has gout again and she would like an Rx because she can't walk.  She wants it to go to [facility] in [place].

## 2011-02-15 DIAGNOSIS — I129 Hypertensive chronic kidney disease with stage 1 through stage 4 chronic kidney disease, or unspecified chronic kidney disease: Secondary | ICD-10-CM | POA: Diagnosis not present

## 2011-02-15 DIAGNOSIS — N390 Urinary tract infection, site not specified: Secondary | ICD-10-CM | POA: Diagnosis not present

## 2011-02-22 DIAGNOSIS — M545 Low back pain: Secondary | ICD-10-CM | POA: Diagnosis not present

## 2011-02-22 DIAGNOSIS — IMO0001 Reserved for inherently not codable concepts without codable children: Secondary | ICD-10-CM | POA: Diagnosis not present

## 2011-02-22 DIAGNOSIS — M25569 Pain in unspecified knee: Secondary | ICD-10-CM | POA: Diagnosis not present

## 2011-03-01 ENCOUNTER — Ambulatory Visit: Payer: BC Managed Care – PPO | Admitting: Family Medicine

## 2011-03-05 ENCOUNTER — Ambulatory Visit: Payer: BC Managed Care – PPO | Admitting: Family Medicine

## 2011-03-05 ENCOUNTER — Telehealth: Payer: Self-pay | Admitting: Family Medicine

## 2011-03-05 NOTE — Telephone Encounter (Signed)
Returned call to patient's husband.  Patient has been "incoherent" and in bed x 2 1/2 days.  Has had similar episodes 2-3 times since hospital discharge for liver failure in November.  Patient is unable to recall birthdate when asked by husband, but is heard in background yelling "I do know my birthdate."  Husband denies patient having hx of stroke, facial drooping or slurred speech, but states she appears to be moving lips at times as if she is trying to speak.  Discussed with Dr. Jennette Kettle (preceptor) and informed to take patient to ED to be evaluated as soon as possible.  Husband verbalized understanding.  Gaylene Brooks, RN

## 2011-03-05 NOTE — Telephone Encounter (Signed)
Husband is calling wanting her to be seen today, she has an appt for Thursday, but he feels that she needs to be seen today because she doesn't seem to know where she is or what is going on.

## 2011-03-06 ENCOUNTER — Other Ambulatory Visit: Payer: Self-pay | Admitting: Family Medicine

## 2011-03-06 NOTE — Telephone Encounter (Signed)
Refill request

## 2011-03-07 ENCOUNTER — Ambulatory Visit: Payer: BC Managed Care – PPO | Admitting: Family Medicine

## 2011-03-08 ENCOUNTER — Ambulatory Visit (INDEPENDENT_AMBULATORY_CARE_PROVIDER_SITE_OTHER): Payer: BC Managed Care – PPO | Admitting: Family Medicine

## 2011-03-08 DIAGNOSIS — F339 Major depressive disorder, recurrent, unspecified: Secondary | ICD-10-CM

## 2011-03-08 DIAGNOSIS — E039 Hypothyroidism, unspecified: Secondary | ICD-10-CM

## 2011-03-08 DIAGNOSIS — F411 Generalized anxiety disorder: Secondary | ICD-10-CM

## 2011-03-08 DIAGNOSIS — K7200 Acute and subacute hepatic failure without coma: Secondary | ICD-10-CM

## 2011-03-08 MED ORDER — ALPRAZOLAM 0.25 MG PO TABS: 0.2500 mg | ORAL_TABLET | Freq: Two times a day (BID) | ORAL | Status: AC | PRN

## 2011-03-08 MED ORDER — CYCLOBENZAPRINE HCL 10 MG PO TABS: 5.0000 mg | ORAL_TABLET | Freq: Two times a day (BID) | ORAL | Status: DC | PRN

## 2011-03-08 NOTE — Patient Instructions (Signed)
Stay with celexa at only one pill daily Decrease cyclobenzaprine to 1/2 a pill three times a day when you need it for muscle spasms.  Stop the nortriptyline.  We will do xanax but a lower dose as well.  Come back 3 weeks later and make sure she is doing well.   Serotonin Syndrome Serotonin is a brain chemical that regulates the nervous system. Some kinds of drugs increase the amount of serotonin in your body. Drugs that increase the serotonin in your body include:   Anti-depressant medications.   St. John's wort.   Recreational drugs.   Migraine medicines.   Some pain medicines.  SYMPTOMS Combining these drugs increases the risk that you will become ill with a toxic condition called serotonin syndrome.  Symptoms of too much serotonin include:  Confusion.   Agitation.   Weakness.   Insomnia.   Fever.   Sweats.  Other symptoms that may develop include:  Shakiness.   Muscle spasms.   Seizures.  TREATMENT  Hospital treatment is often needed until the effects are controlled.   Avoiding the combination of medicines listed above is recommended.   Check with your doctor if you are concerned about your medicine or the side effects.  Document Released: 02/15/2004 Document Revised: 09/19/2010 Document Reviewed: 01/07/2005 [facility] Patient Information 2012 [place], [place].

## 2011-03-09 ENCOUNTER — Encounter: Payer: Self-pay | Admitting: Family Medicine

## 2011-03-09 NOTE — Assessment & Plan Note (Signed)
Patient was increased on her Synthroid recently. Not time to check a TSH at this time could be more of a hypothyroidism could be contributing as well. We'll continue to monitor. If patient does not improve consider changing her to armor

## 2011-03-09 NOTE — Progress Notes (Signed)
  Subjective:    Patient ID: [name], female    DOB: [birth year], 51 y.o.   MRN: [id number]  HPI 51 year old female coming in again with followup from fairly recent hospitalization at [facility]. Please see detailed note from discharge summary as well as Dr. Jeri Lager followup. Patient was altered at that time were markedly it up to potential liver failure. Patient improved but started having some altered mental status again over the course of this week. Patient's husband has been giving her her medications including Celexa Flexeril benzodiazepines pain medications she gets a pain clinic. Patient has also been taking nortriptyline at night. When discussing it patient was taking Celexa for tablet daily instead of 40 mg. With all history he seems that somewhat concerned that patient wasn't serotonin syndrome and seems to be starting to have the same problem. Patient's husband states that he stopped the nortriptyline again for the last 3 days and patient seems to be improving. Patient also states she feels better now. Patient denies any fevers chills nausea vomiting abdominal pain or any numbness in the extremities other than her regular numbness in her left hand.   Review of Systems As stated above    Objective:   Physical Exam General: Patient seems moderately anxious HEENT moist mucous membranes patient's pupils are minorly dilated but reactive Cardiovascular regular rate and rhythm Pulmonary: Clear to auscultation bilaterally Abdomen bowel sounds positive nontender no organomegaly. Extremities 2+ pulses neurovascularly intact.    Assessment & Plan:

## 2011-03-09 NOTE — Assessment & Plan Note (Signed)
Patient has history of significant depression and where she has been hospitalized before. Patient denies any suicidal or homicidal ideation at this time. Do not feel that he would be a good course of action to decrease her Celexa for take it off at this time. Patient does have a potential for an serotonin syndrome. Other medications and monitor closely. Patient will be following up in 3 weeks to make sure she continues to improve

## 2011-03-09 NOTE — Assessment & Plan Note (Signed)
Results of labs a month ago no signs of jaundice or any clinical exam done may be concerned. Do think patient's: Patient was likely from a serotonin syndrome that was unfortunately not diagnosed. Patient though will be doing much better now we have stopped her  nortriptyline and decrease her Flexeril. If patient is still having some trouble at followup with consider stopping the Celexa and is starting SNRI to see if the change in class would help.

## 2011-03-09 NOTE — Assessment & Plan Note (Signed)
Patient is still very anxious. Discussed that this is likely secondary to some depression as well as medications. Told her we will do Xanax but it very very low dose with her history of liver failure. Is going to continue to keep the medications and give it to her properly. The reason that it seemed to get out of hand was he was hospitalized for potential MI during that time would she was not taking her medications properly. Hopefully we will not have this problem again. Would like to taper her off the Xanax in the long run.

## 2011-04-01 ENCOUNTER — Ambulatory Visit: Payer: BC Managed Care – PPO | Admitting: Family Medicine

## 2011-06-14 ENCOUNTER — Emergency Department (HOSPITAL_COMMUNITY)
Admission: EM | Admit: 2011-06-14 | Discharge: 2011-06-14 | Disposition: A | Discharge status: Home / Self Care | Payer: BC Managed Care – PPO | Attending: Emergency Medicine | Admitting: Emergency Medicine

## 2011-06-14 ENCOUNTER — Encounter (HOSPITAL_COMMUNITY): Payer: Self-pay | Admitting: Emergency Medicine

## 2011-06-14 DIAGNOSIS — IMO0002 Reserved for concepts with insufficient information to code with codable children: Secondary | ICD-10-CM | POA: Diagnosis not present

## 2011-06-14 DIAGNOSIS — Y92009 Unspecified place in unspecified non-institutional (private) residence as the place of occurrence of the external cause: Secondary | ICD-10-CM | POA: Insufficient documentation

## 2011-06-14 DIAGNOSIS — G894 Chronic pain syndrome: Secondary | ICD-10-CM | POA: Insufficient documentation

## 2011-06-14 DIAGNOSIS — K219 Gastro-esophageal reflux disease without esophagitis: Secondary | ICD-10-CM | POA: Insufficient documentation

## 2011-06-14 DIAGNOSIS — F17200 Nicotine dependence, unspecified, uncomplicated: Secondary | ICD-10-CM | POA: Insufficient documentation

## 2011-06-14 DIAGNOSIS — E039 Hypothyroidism, unspecified: Secondary | ICD-10-CM | POA: Insufficient documentation

## 2011-06-14 DIAGNOSIS — S80819A Abrasion, unspecified lower leg, initial encounter: Secondary | ICD-10-CM

## 2011-06-14 DIAGNOSIS — W64XXXA Exposure to other animate mechanical forces, initial encounter: Secondary | ICD-10-CM | POA: Insufficient documentation

## 2011-06-14 DIAGNOSIS — Z7982 Long term (current) use of aspirin: Secondary | ICD-10-CM | POA: Insufficient documentation

## 2011-06-14 DIAGNOSIS — N183 Chronic kidney disease, stage 3 unspecified: Secondary | ICD-10-CM | POA: Insufficient documentation

## 2011-06-14 DIAGNOSIS — M810 Age-related osteoporosis without current pathological fracture: Secondary | ICD-10-CM | POA: Insufficient documentation

## 2011-06-14 DIAGNOSIS — E785 Hyperlipidemia, unspecified: Secondary | ICD-10-CM | POA: Insufficient documentation

## 2011-06-14 DIAGNOSIS — S50819A Abrasion of unspecified forearm, initial encounter: Secondary | ICD-10-CM

## 2011-06-14 DIAGNOSIS — I129 Hypertensive chronic kidney disease with stage 1 through stage 4 chronic kidney disease, or unspecified chronic kidney disease: Secondary | ICD-10-CM | POA: Insufficient documentation

## 2011-06-14 DIAGNOSIS — Z79899 Other long term (current) drug therapy: Secondary | ICD-10-CM | POA: Insufficient documentation

## 2011-06-14 MED ORDER — AMOXICILLIN-POT CLAVULANATE 875-125 MG PO TABS: 1.0000 | ORAL_TABLET | Freq: Two times a day (BID) | ORAL | Status: DC

## 2011-06-14 MED ORDER — TETANUS-DIPHTH-ACELL PERTUSSIS 5-2.5-18.5 LF-MCG/0.5 IM SUSP
0.5000 mL | Freq: Once | INTRAMUSCULAR | Status: AC
  Administered 2011-06-14: 0.5 mL via INTRAMUSCULAR
  Filled 2011-06-14: qty 0.5

## 2011-06-14 MED ORDER — AMOXICILLIN-POT CLAVULANATE 875-125 MG PO TABS: 1.0000 | ORAL_TABLET | Freq: Two times a day (BID) | ORAL | Status: AC

## 2011-06-14 MED ORDER — AMOXICILLIN-POT CLAVULANATE 875-125 MG PO TABS
1.0000 | ORAL_TABLET | Freq: Once | ORAL | Status: AC
  Administered 2011-06-14: 1 via ORAL
  Filled 2011-06-14: qty 1

## 2011-06-14 NOTE — ED Notes (Signed)
Pt states a cat "ran up" her body. Denies any bites from the animal. Puncture wounds notes on R arm proximal to wrist; R leg lateral to knee and near groin; L leg medial to knee. Pt states cat is not sick but she does not know if animal is up to date on shots.

## 2011-06-14 NOTE — ED Provider Notes (Signed)
History     CSN: [id number]  Arrival date & time 06/14/11  3086   First MD Initiated Contact with Patient 06/14/11 [phone]      Chief Complaint  Patient presents with  . Animal Bite    (Consider location/radiation/quality/duration/timing/severity/associated sxs/prior treatment) Patient is a 51 y.o. female presenting with animal bite. The history is provided by the patient and medical records. No language interpreter was used.  Animal Bite  The incident occurred yesterday (Pt was scratched and/or bitten by her son's cat after pt's dog growled at the cat.  She was scracthed/bitten on the right and left inner thighs, right lateral thigh, and right dorsal forearm.  ). The incident occurred at home. She came to the ER via personal transport. There is an injury to the right forearm. There is an injury to the right thigh and left thigh. The pain is mild. There have been no prior injuries to these areas. Her tetanus status is unknown. She has received no recent medical care.    Past Medical History  Diagnosis Date  . Allergy   . Arthritis   . Depression   . GERD (gastroesophageal reflux disease)   . Hyperlipidemia   . Hypertension   . CKD (chronic kidney disease) stage 3, GFR 30-59 ml/min   . Osteoporosis   . Hypothyroidism   . Anxiety   . Chronic pain syndrome   . Tobacco abuse   . Obesity   . Gastroparesis   . Ankylosing spondylitis   . Nephrolithiasis     Past Surgical History  Procedure Date  . Total knee arthroplasty     left  . Back surgery   . Neck surgery   . Appendectomy   . Cholecystectomy   . Right knee arthroplasty     Family History  Problem Relation Age of Onset  . Diabetes Mother   . Heart failure Mother   . Hyperlipidemia Mother   . Hypertension Mother   . Hyperlipidemia Father   . Cancer Son     lymphoma stage 4 at 30    History  Substance Use Topics  . Smoking status: Current Everyday Smoker -- 0.5 packs/day    Types: Cigarettes  . Smokeless  tobacco: Not on file  . Alcohol Use: No    OB History    Grav Para Term Preterm Abortions TAB SAB Ect Mult Living                  Review of Systems  All other systems reviewed and are negative.    Allergies  Codeine and Latex  Home Medications   Current Outpatient Rx  Name Route Sig Dispense Refill  . AMOXICILLIN-POT CLAVULANATE 875-125 MG PO TABS Oral Take 1 tablet by mouth every 12 (twelve) hours. 14 tablet 0  . ASPIRIN EC 81 MG PO TBEC Oral Take 81 mg by mouth daily.      Marland Kitchen VITAMIN D-3 5000 UNITS PO TABS Oral Take 1 capsule by mouth daily.      Marland Kitchen CITALOPRAM HYDROBROMIDE 40 MG PO TABS Oral Take 1 tablet (40 mg total) by mouth every morning. 180 tablet 1  . CYCLOBENZAPRINE HCL 10 MG PO TABS Oral Take 0.5 tablets (5 mg total) by mouth 3 times/day as needed-between meals & bedtime for muscle spasms. 30 tablet   . GABAPENTIN 100 MG PO CAPS Oral Take 200 mg by mouth 2 (two) times daily. Take 2 tabs by mouth two times a day    . LANSOPRAZOLE  30 MG PO CPDR  TAKE 1 CAPSULE BY MOUTH EVERY DAY 90 capsule 0  . LEVOTHYROXINE SODIUM 75 MCG PO TABS Oral Take 1 tablet (75 mcg total) by mouth daily. 90 tablet 0  . LOPERAMIDE HCL 2 MG PO CAPS Oral Take 2 mg by mouth 4 (four) times daily as needed. Diarrhea     . OXYCODONE HCL ER 20 MG PO TB12 Oral Take 20 mg by mouth every 12 (twelve) hours.      Marland Kitchen PRAVASTATIN SODIUM 20 MG PO TABS Oral Take 20 mg by mouth Daily.      BP 110/86  Temp(Src) 97.9 F (36.6 C) (Oral)  Resp 18  SpO2 93%  LMP 05/17/2009  Physical Exam  Nursing note and vitals reviewed. Constitutional: She is oriented to person, place, and time.       Obese middle aged woman in no distress at rest.  HENT:  Head: Normocephalic and atraumatic.  Right Ear: External ear normal.  Left Ear: External ear normal.  Mouth/Throat: Oropharynx is clear and moist.  Eyes: EOM are normal. Pupils are equal, round, and reactive to light.  Neck: Normal range of motion. Neck supple.    Musculoskeletal: Normal range of motion. She exhibits no edema.  Neurological: She is alert and oriented to person, place, and time.       No sensory or motor deficits.  Skin:       She has multiple puncture wounds on the dorsum of the right forearm, right lateral thigh, right medial thigh with a 1 cm neglected laceration and a 3 cm hematoma, and on the left medial thigh.  There is no associated redness, heat or lymphangitic streaking.  Psychiatric: She has a normal mood and affect. Her behavior is normal.    ED Course  Procedures (including critical care time)   Pt was advised to have TDAP booster and to take Augmentin 875/125 bid x 7 days.  1. Cat scratch of forearm   2. Cat scratch of lower leg        Carleene Cooper III, MD 06/14/11 [phone]

## 2011-06-20 ENCOUNTER — Ambulatory Visit: Payer: BC Managed Care – PPO | Admitting: Family Medicine

## 2011-06-21 ENCOUNTER — Ambulatory Visit (INDEPENDENT_AMBULATORY_CARE_PROVIDER_SITE_OTHER): Payer: BC Managed Care – PPO | Admitting: Family Medicine

## 2011-06-21 ENCOUNTER — Encounter: Payer: Self-pay | Admitting: Family Medicine

## 2011-06-21 VITALS — BP 120/80 | HR 76 | Temp 97.9°F | Ht 63.0 in | Wt 175.0 lb

## 2011-06-21 DIAGNOSIS — E039 Hypothyroidism, unspecified: Secondary | ICD-10-CM

## 2011-06-21 DIAGNOSIS — E559 Vitamin D deficiency, unspecified: Secondary | ICD-10-CM

## 2011-06-21 DIAGNOSIS — N951 Menopausal and female climacteric states: Secondary | ICD-10-CM

## 2011-06-21 DIAGNOSIS — R5383 Other fatigue: Secondary | ICD-10-CM

## 2011-06-21 DIAGNOSIS — I10 Essential (primary) hypertension: Secondary | ICD-10-CM

## 2011-06-21 DIAGNOSIS — F339 Major depressive disorder, recurrent, unspecified: Secondary | ICD-10-CM

## 2011-06-21 DIAGNOSIS — R232 Flushing: Secondary | ICD-10-CM

## 2011-06-21 DIAGNOSIS — M069 Rheumatoid arthritis, unspecified: Secondary | ICD-10-CM | POA: Diagnosis not present

## 2011-06-21 DIAGNOSIS — R5381 Other malaise: Secondary | ICD-10-CM | POA: Diagnosis not present

## 2011-06-21 LAB — COMPREHENSIVE METABOLIC PANEL
ALT: 17 U/L (ref 0–35)
AST: 14 U/L (ref 0–37)
Albumin: 4.1 g/dL (ref 3.5–5.2)
Alkaline Phosphatase: 93 U/L (ref 39–117)
Potassium: 4.2 mEq/L (ref 3.5–5.3)
Sodium: 140 mEq/L (ref 135–145)
Total Protein: 6.3 g/dL (ref 6.0–8.3)

## 2011-06-21 MED ORDER — BLACK COHOSH 450 MG PO CAPS: 1.0000 | ORAL_CAPSULE | Freq: Every day | ORAL | Status: DC

## 2011-06-21 NOTE — Patient Instructions (Signed)
Is very good to see you again. I'm glad you're doing so well. I will check some labs and call you with the results.  for your hot flashes I want you to try black cohosh. It was a pleasure to get to know you.

## 2011-06-22 ENCOUNTER — Encounter: Payer: Self-pay | Admitting: Family Medicine

## 2011-06-22 ENCOUNTER — Other Ambulatory Visit: Payer: Self-pay | Admitting: Family Medicine

## 2011-06-22 DIAGNOSIS — R232 Flushing: Secondary | ICD-10-CM | POA: Insufficient documentation
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
## 2011-06-22 NOTE — Assessment & Plan Note (Signed)
Could be secondary to patient's thyroid disease. Patient though is about the age where she should be going through menopause. We will continue to monitor closely. Told to try some black cohosh over-the-counter first. He should followup as needed.

## 2011-06-22 NOTE — Assessment & Plan Note (Signed)
Will recheck thyroid at this time, encourage patient to this medication probably should be continued. This might be contributing to patient's increasing hot flashes. Refill medication today.

## 2011-06-22 NOTE — Assessment & Plan Note (Signed)
At goal at this time we'll continue current therapy. Encourage patient to exercise and watch diet.

## 2011-06-22 NOTE — Assessment & Plan Note (Signed)
Patient appears to be in great spirits at this time, encourage patient to do no medications and to monitor her her mood closely. Patient knows to contact us if worsening mood at any point.

## 2011-06-24 ENCOUNTER — Telehealth: Payer: Self-pay | Admitting: Family Medicine

## 2011-06-24 NOTE — Telephone Encounter (Signed)
Called patient about labs Told her everything but cholesterol improved, pt though is losing weight and feeling great.

## 2011-07-02 ENCOUNTER — Telehealth: Payer: Self-pay | Admitting: Family Medicine

## 2011-07-02 MED ORDER — BUSPIRONE HCL 7.5 MG PO TABS: 7.5000 mg | ORAL_TABLET | Freq: Three times a day (TID) | ORAL | Status: DC

## 2011-07-02 NOTE — Telephone Encounter (Signed)
Called and left patient message stating will not start xanax again but would try buspar as well.  Gave potential side effects, call if she has questions.

## 2011-07-02 NOTE — Telephone Encounter (Signed)
Pt is asking for the xanax to be added back on.  Her son and his family moved in with her and she is afraid she is going to be too overwhelmed with them.  They are very over active and is afraid she is going to "go off on" them  [facility] [address] [place]

## 2011-07-09 ENCOUNTER — Telehealth: Payer: Self-pay | Admitting: Family Medicine

## 2011-07-09 MED ORDER — HYDROXYZINE HCL 25 MG PO TABS: 25.0000 mg | ORAL_TABLET | Freq: Three times a day (TID) | ORAL | Status: AC | PRN

## 2011-07-09 NOTE — Telephone Encounter (Signed)
Patient calling to say the Buspar rx written is not on [employer]' formulary list.  Cost too much to purchase.  Patient would like to go back to her Xanax.  Only paid $5 for her rx.

## 2011-07-09 NOTE — Telephone Encounter (Signed)
Will give her another medicine called hydroxyzine she can try.  Would not like patient to be on a medicine so addicting.  Please call and relay the message.  Sent in RX Thank you.

## 2011-07-09 NOTE — Telephone Encounter (Signed)
Pt informed. [name]  

## 2011-09-02 ENCOUNTER — Ambulatory Visit (INDEPENDENT_AMBULATORY_CARE_PROVIDER_SITE_OTHER): Payer: BC Managed Care – PPO | Admitting: Family Medicine

## 2011-09-02 ENCOUNTER — Encounter: Payer: Self-pay | Admitting: Family Medicine

## 2011-09-02 VITALS — BP 147/87 | HR 101 | Temp 98.0°F | Ht 63.0 in | Wt 177.2 lb

## 2011-09-02 DIAGNOSIS — R232 Flushing: Secondary | ICD-10-CM

## 2011-09-02 DIAGNOSIS — N951 Menopausal and female climacteric states: Secondary | ICD-10-CM

## 2011-09-02 DIAGNOSIS — E039 Hypothyroidism, unspecified: Secondary | ICD-10-CM

## 2011-09-02 DIAGNOSIS — F339 Major depressive disorder, recurrent, unspecified: Secondary | ICD-10-CM

## 2011-09-02 MED ORDER — LEVOTHYROXINE SODIUM 75 MCG PO TABS: 75.0000 ug | ORAL_TABLET | Freq: Every day | ORAL | Status: DC

## 2011-09-02 MED ORDER — LANSOPRAZOLE 30 MG PO CPDR: 30.0000 mg | DELAYED_RELEASE_CAPSULE | Freq: Every day | ORAL | Status: DC

## 2011-09-02 MED ORDER — AMITRIPTYLINE HCL 50 MG PO TABS: 50.0000 mg | ORAL_TABLET | Freq: Every day | ORAL | Status: DC

## 2011-09-02 NOTE — Patient Instructions (Signed)
It was nice to meet you.  I am sorry you have been having such a tough time lately.  I want to try a different anxiety/antidepressant medication.  You will start at 1/2 a tablet at bedtime for two nights, then go to a whole tablet.  Please come back to see me in about a month so I can see how you are doing.    I also suggest you call Dr. Pascal Lux, our psychologist, to do some therapy about your anxiety.  Please call her to see about scheduling an appointment.

## 2011-09-03 NOTE — Assessment & Plan Note (Signed)
Worsened depression and anxiety.  I am hesitant to re-start an SSRI or SNRI considering patient's hx of serotonin syndrome.  She also has drug seeking behavior, and I agree with Dr. Michaelle Copas assessment that his patient is high risk for abuse with benzodiazepines.  I will start amitriptyline for depression, see if it improves her mood.  I have also suggested she see Dr. Pascal Lux for therapy, and given her contact information.

## 2011-09-03 NOTE — Assessment & Plan Note (Signed)
Advised to continue black cohosh,  Discussed that due to medical problems and smoking, would not recommend hormone replacement for her.

## 2011-09-03 NOTE — Assessment & Plan Note (Signed)
Considering fatigue, mood, hot flashes, will check TSH to make sure she is still therapeutic.
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
## 2011-09-04 ENCOUNTER — Encounter: Payer: Self-pay | Admitting: Family Medicine

## 2011-09-10 ENCOUNTER — Telehealth: Payer: Self-pay | Admitting: Family Medicine

## 2011-09-10 DIAGNOSIS — M5030 Other cervical disc degeneration, unspecified cervical region: Secondary | ICD-10-CM | POA: Diagnosis not present

## 2011-09-10 DIAGNOSIS — M109 Gout, unspecified: Secondary | ICD-10-CM | POA: Diagnosis not present

## 2011-09-10 DIAGNOSIS — IMO0001 Reserved for inherently not codable concepts without codable children: Secondary | ICD-10-CM | POA: Diagnosis not present

## 2011-09-10 DIAGNOSIS — M1710 Unilateral primary osteoarthritis, unspecified knee: Secondary | ICD-10-CM | POA: Diagnosis not present

## 2011-09-10 NOTE — Telephone Encounter (Signed)
Patient tried the Amitriptyline and it did not work for her.  It made her thought echo and she just felt she is better off without it.  She would like to speak to Dr. Lula Olszewski about this.

## 2011-09-11 NOTE — Telephone Encounter (Signed)
Called patient back. Informed her that she should call back for f/u appt with Dr. Lula Olszewski next week.

## 2011-09-13 ENCOUNTER — Telehealth: Payer: Self-pay | Admitting: Family Medicine

## 2011-09-13 ENCOUNTER — Other Ambulatory Visit: Payer: Self-pay | Admitting: Emergency Medicine

## 2011-09-13 DIAGNOSIS — Z1231 Encounter for screening mammogram for malignant neoplasm of breast: Secondary | ICD-10-CM

## 2011-09-13 NOTE — Telephone Encounter (Signed)
Patient is calling because of tenderness in her left breast.  She said that she hasn't had a mammogram in a while.  She called [facility] and they told her that she needs a referral from her MD first.

## 2011-09-13 NOTE — Telephone Encounter (Signed)
If pt mentions any kind of problem (I.E tenderness) then pt needs a referral.  Usually for u/s or something more than just a mammogram.  Will send to MD Fle[facility], Maryjo Rochester

## 2011-10-03 ENCOUNTER — Ambulatory Visit: Payer: BC Managed Care – PPO | Admitting: Family Medicine

## 2011-10-04 ENCOUNTER — Ambulatory Visit: Payer: BC Managed Care – PPO | Admitting: Family Medicine

## 2011-10-07 ENCOUNTER — Encounter: Payer: Self-pay | Admitting: Family Medicine

## 2011-10-07 ENCOUNTER — Ambulatory Visit (INDEPENDENT_AMBULATORY_CARE_PROVIDER_SITE_OTHER): Payer: BC Managed Care – PPO | Admitting: Family Medicine

## 2011-10-07 VITALS — BP 131/83 | HR 116 | Temp 98.1°F | Ht 63.0 in | Wt 188.0 lb

## 2011-10-07 DIAGNOSIS — R599 Enlarged lymph nodes, unspecified: Secondary | ICD-10-CM

## 2011-10-07 DIAGNOSIS — N644 Mastodynia: Secondary | ICD-10-CM | POA: Insufficient documentation

## 2011-10-07 LAB — CBC WITH DIFFERENTIAL/PLATELET
Eosinophils Absolute: 0.3 10*3/uL (ref 0.0–0.7)
Hemoglobin: 15.4 g/dL — ABNORMAL HIGH (ref 12.0–15.0)
Lymphs Abs: 3.8 10*3/uL (ref 0.7–4.0)
MCH: 31.2 pg (ref 26.0–34.0)
Monocytes Relative: 6 % (ref 3–12)
Neutro Abs: 5.2 10*3/uL (ref 1.7–7.7)
Neutrophils Relative %: 53 % (ref 43–77)
RBC: 4.94 MIL/uL (ref 3.87–5.11)

## 2011-10-07 NOTE — Patient Instructions (Addendum)
If your lab results are normal, I will send you a letter with the results. If abnormal, someone at the clinic will get in touch with you.   We will schedule you for a mammogram and breast ultrasound.  I think you pulled a muscle. I would recommend warm compresses to that area to see if it helps.   Follow-up with Dr. Lula Olszewski at your convenience to discuss your skin and other issues.

## 2011-10-07 NOTE — Assessment & Plan Note (Signed)
She is due for mammogram (last one 5 years ago), will check diagnostic mammogram and ultrasound of left breast to rule-out malignancy. She has a family history of skin cancer and lymphoma.  She may have pulled a muscle in that area. She has significant tenderness in left pectoralis region. I am recommending warm compresses. She is already on narcotics for her chronic pain.

## 2011-10-07 NOTE — Progress Notes (Signed)
  Subjective:    Patient ID: [name], female    DOB: [birth year], 52 y.o.   MRN: [id number]  HPI # Left breast tenderness for the past few months It is persistent.  Exacerbated by: moving her left arm  Alleviated by: nothing  Menarche at age 87 Menopause: a few years ago   She has a family history of skin cancer in her mother who died from it at age 42  # Swollen lymph nodes on her neck She had a cold recently, but her lymph nodes remain swollen. She thinks they are getting bigger. She is concerned about lymphoma; a family member was recently diagnosed with it.  ROS: denies fevers, chills, dysphagia  Review of Systems Per HPI Denies dyspnea  Allergies, medication, past medical history reviewed.  Significant for: -Hypothyroidism--discussed normal TSH in August 2013 -HLD, HTN -Obesity -Depression, anxiety -Smoker--she is trying to cut back -Stage 3 CKD -RA, ankylosing spondylitis, osteoporosis, scoliosis -Hot flashes--black cohosh helps    Objective:   Physical Exam GEN: NAD; obese PSYCH: appropriate to questions HEENT:   Head: [place]/AT   Eyes: normal conjunctiva without injection or tearing   Ears: TM clear bilaterally with good light reflex and without erythema or air-fluid level   Nose: no rhinorrhea, normal turbinates   Mouth: MMM; s/p tonsillectomy; no oropharyngeal erythema NECK: no palpable lymph nodes; ?fullness in submandibular area (bilaterally) CV: RRR, no m/r/g PULM: NI WOB BREAST: moderate tenderness along left pectoralis muscle; no masses palpable; no nipple discharge; normal breast skin    Assessment & Plan:

## 2011-10-07 NOTE — Assessment & Plan Note (Signed)
I suspect reactive lymphadenopathy from her recent cold. Her tobacco use is probably making it more difficult for her to recover. We will check CBC with differential to evaluate her cell lines; she is concerned about lymphoma since she had a family member recently diagnosed. She does not have any fevers/chills or concerning symptoms.

## 2011-10-08 ENCOUNTER — Ambulatory Visit
Admission: RE | Admit: 2011-10-08 | Discharge: 2011-10-08 | Disposition: A | Discharge status: Home / Self Care | Payer: BC Managed Care – PPO | Source: Ambulatory Visit | Attending: Family Medicine | Admitting: Family Medicine

## 2011-10-08 ENCOUNTER — Telehealth: Payer: Self-pay | Admitting: Family Medicine

## 2011-10-08 ENCOUNTER — Encounter: Payer: Self-pay | Admitting: Family Medicine

## 2011-10-08 DIAGNOSIS — N644 Mastodynia: Secondary | ICD-10-CM

## 2011-10-08 NOTE — Telephone Encounter (Signed)
Please thank her for letting me know and advise she make a follow-up appointment with her PCP or another provider to have this issue addressed.

## 2011-10-08 NOTE — Telephone Encounter (Signed)
[name] called to inform Dr. Madolyn Frieze that she was having difficulty with swallowing when she eats.  Sometime it goes down the wrong pipe.  She have to cough and re-swallow to make it go down.  It happens at least 2-3 times when she eats.  She assumed that because she has a lot of sinus d rainage that would be the cause.  Wanted to inform her of this in case she needed to have something done about it.

## 2011-10-24 ENCOUNTER — Ambulatory Visit: Payer: BC Managed Care – PPO | Admitting: Family Medicine

## 2011-11-04 DIAGNOSIS — N182 Chronic kidney disease, stage 2 (mild): Secondary | ICD-10-CM | POA: Diagnosis not present

## 2011-11-04 DIAGNOSIS — E669 Obesity, unspecified: Secondary | ICD-10-CM | POA: Diagnosis not present

## 2011-11-04 DIAGNOSIS — E213 Hyperparathyroidism, unspecified: Secondary | ICD-10-CM | POA: Diagnosis not present

## 2011-11-14 ENCOUNTER — Telehealth: Payer: Self-pay | Admitting: Family Medicine

## 2011-11-14 NOTE — Telephone Encounter (Signed)
Patient is calling because she misplaced her appt card and paperwork for stomach band surgery and she would like another copy of it.

## 2011-11-15 NOTE — Telephone Encounter (Signed)
Called and left message on patient voicemail that I would mail her out some information.

## 2011-11-20 ENCOUNTER — Telehealth: Payer: Self-pay | Admitting: Family Medicine

## 2011-11-20 NOTE — Telephone Encounter (Signed)
Patient is calling to speak to someone about the Referal for Lap Band.

## 2011-11-21 NOTE — Telephone Encounter (Signed)
Left message on patient voicemail, that info regarding the lapband was mailed out to her late last week, and if she has any other questions to call the office.

## 2011-11-28 ENCOUNTER — Other Ambulatory Visit: Payer: Self-pay | Admitting: Family Medicine

## 2011-12-11 DIAGNOSIS — M5137 Other intervertebral disc degeneration, lumbosacral region: Secondary | ICD-10-CM | POA: Diagnosis not present

## 2011-12-11 DIAGNOSIS — M25569 Pain in unspecified knee: Secondary | ICD-10-CM | POA: Diagnosis not present

## 2011-12-11 DIAGNOSIS — M5030 Other cervical disc degeneration, unspecified cervical region: Secondary | ICD-10-CM | POA: Diagnosis not present

## 2011-12-11 DIAGNOSIS — IMO0001 Reserved for inherently not codable concepts without codable children: Secondary | ICD-10-CM | POA: Diagnosis not present

## 2012-02-11 DIAGNOSIS — M216X9 Other acquired deformities of unspecified foot: Secondary | ICD-10-CM | POA: Diagnosis not present

## 2012-03-13 ENCOUNTER — Ambulatory Visit (INDEPENDENT_AMBULATORY_CARE_PROVIDER_SITE_OTHER): Payer: BC Managed Care – PPO | Admitting: Family Medicine

## 2012-03-13 ENCOUNTER — Encounter: Payer: Self-pay | Admitting: Family Medicine

## 2012-03-13 VITALS — BP 118/82 | HR 111 | Temp 98.2°F | Ht 63.0 in | Wt 214.0 lb

## 2012-03-13 DIAGNOSIS — E7800 Pure hypercholesterolemia, unspecified: Secondary | ICD-10-CM

## 2012-03-13 DIAGNOSIS — E559 Vitamin D deficiency, unspecified: Secondary | ICD-10-CM

## 2012-03-13 DIAGNOSIS — E039 Hypothyroidism, unspecified: Secondary | ICD-10-CM

## 2012-03-13 DIAGNOSIS — E669 Obesity, unspecified: Secondary | ICD-10-CM

## 2012-03-13 DIAGNOSIS — H9319 Tinnitus, unspecified ear: Secondary | ICD-10-CM

## 2012-03-13 DIAGNOSIS — I10 Essential (primary) hypertension: Secondary | ICD-10-CM

## 2012-03-13 LAB — BASIC METABOLIC PANEL
BUN: 15 mg/dL (ref 6–23)
CO2: 28 mEq/L (ref 19–32)
Chloride: 98 mEq/L (ref 96–112)
Creat: 1.3 mg/dL — ABNORMAL HIGH (ref 0.50–1.10)
Glucose, Bld: 107 mg/dL — ABNORMAL HIGH (ref 70–99)

## 2012-03-13 LAB — CBC
Hemoglobin: 15.2 g/dL — ABNORMAL HIGH (ref 12.0–15.0)
MCHC: 33.8 g/dL (ref 30.0–36.0)
RDW: 14.9 % (ref 11.5–15.5)

## 2012-03-13 LAB — LIPID PANEL
Cholesterol: 245 mg/dL — ABNORMAL HIGH (ref 0–200)
Triglycerides: 263 mg/dL — ABNORMAL HIGH (ref <150)

## 2012-03-13 LAB — TSH: TSH: 5.474 u[IU]/mL — ABNORMAL HIGH (ref 0.350–4.500)

## 2012-03-13 MED ORDER — LANSOPRAZOLE 30 MG PO CPDR: 30.0000 mg | DELAYED_RELEASE_CAPSULE | Freq: Every day | ORAL | Status: DC

## 2012-03-13 MED ORDER — CYCLOBENZAPRINE HCL 10 MG PO TABS: 5.0000 mg | ORAL_TABLET | Freq: Two times a day (BID) | ORAL | Status: DC | PRN

## 2012-03-13 MED ORDER — LEVOTHYROXINE SODIUM 75 MCG PO TABS: 75.0000 ug | ORAL_TABLET | Freq: Every day | ORAL | Status: DC

## 2012-03-13 MED ORDER — DICLOFENAC SODIUM 1 % TD GEL: 2.0000 g | Freq: Four times a day (QID) | TRANSDERMAL | Status: DC

## 2012-03-13 MED ORDER — COLCHICINE 0.6 MG PO TABS: 0.6000 mg | ORAL_TABLET | Freq: Every day | ORAL | Status: DC

## 2012-03-13 NOTE — Assessment & Plan Note (Signed)
Overdue for lipids, will re-check so we can optimize her health as she prepares for bariatric surgery.

## 2012-03-13 NOTE — Assessment & Plan Note (Addendum)
Check TSH. Refill synthroid.

## 2012-03-13 NOTE — Assessment & Plan Note (Signed)
Well controlled off medications, will continue to monitor.

## 2012-03-13 NOTE — Patient Instructions (Addendum)
I am glad you are able to move forward with your bariatric surgery.  Please let them know that your Doctors have the same electronic medical record (Epic) and all your history is there.  We are happy to fax records if they need them faxed.   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  I recommend seeing your dentist about this.  In the mean time, use the voltaren gel over the jaw joint (the spot that hurts) and you can also put ice on this.   For your overactive bladder- please try oxybutynin.  Let me know next visit if this does not help.   For your blood pressure- please continue to monitor it, let me know if it is frequently above 140/90.    I am going to check some blood work to see if you need to be taking your iron and vitamin D. I will send you a letter with your lab results, or call you if anything is abnormal.

## 2012-03-13 NOTE — Assessment & Plan Note (Signed)
Re-check Vitamin D to determine if she needs Vit D supplement.

## 2012-03-13 NOTE — Progress Notes (Signed)
Subjective:    Patient ID: [name], female    DOB: February 23, 1960, 52 y.o.   MRN: [id number]  HPI: [name] comes in for follow up.  She has several complaints.   Obesity: has had fluctuating weights (185-222 lbs) reviewing records since 2008, with the exception of weight in 170's after she was critically ill in the ICU.  Her BMI's have ranged from 32-39.  She says she only eats once a day and eats very little.  She cannot exercise because of her chronic back and knee pain.  She is starting the process to have Bariatric surgery.    Tobacco abuse: patient has smoked 1 ppd since she was 21 or so.  She says she wants to quit after she gets the weight off but is scared she will gain weight if she tries before. I did discuss with her that smoking cessation 8 weeks before major surgery can decrease risk of complications, but she says she is too afraid of gaining more weight.   Tinnitus: patient has had ringing in her hears for years.  She says it is constant, but she notices most at night because it is quiet and she says she cannot sleep because of it.  She says she only sleeps on hour a night because of it. Denies complaints of hearing loss. She says she dislocated her jaw when she was in 6th grade.  Has popping with chewing sometimes.   HTN: is currently off BP medicaitons check BP a few times and they run 140's/90's.  Denies chest pain, palpitations, LE edema.   Vit D deficiency: not taking supplement but has had to take replacement dose in past.  She seems to have stopped taking it when someone told her it was normal.   Anemia: last hemoglobin in September was high so she stopped taking iron.  Wants to know if she is supposed to take it or not.   Past Medical History  Diagnosis Date  . Allergy   . Arthritis   . Depression   . GERD (gastroesophageal reflux disease)   . Hyperlipidemia   . Hypertension   . CKD (chronic kidney disease) stage 3, GFR 30-59 ml/min   . Osteoporosis   .  Hypothyroidism   . Anxiety   . Chronic pain syndrome   . Tobacco abuse   . Obesity   . Gastroparesis   . Ankylosing spondylitis   . Nephrolithiasis     History  Substance Use Topics  . Smoking status: Current Every Day Smoker -- 1.00 packs/day    Types: Cigarettes  . Smokeless tobacco: Not on file  . Alcohol Use: No    Family History  Problem Relation Age of Onset  . Diabetes Mother   . Heart failure Mother   . Hyperlipidemia Mother   . Hypertension Mother   . Hyperlipidemia Father   . Cancer Son     lymphoma stage 4 at 30   ROS Pertinent items in HPI    Objective:  Physical Exam:  BP 118/82  Pulse 111  Temp(Src) 98.2 F (36.8 C) (Oral)  Ht 5\' 3"  (1.6 m)  Wt 214 lb (97.07 kg)  BMI 37.92 kg/m2  LMP 05/17/2009 General appearance: alert, cooperative and no distress Face: + pain at mandibular joint, popping with open and close Ears: Normal TM's and canals.  Head: Normocephalic, without obvious abnormality, atraumatic Lungs: clear to auscultation bilaterally Heart: regular rate and rhythm, S1, S2 normal, no murmur, click, rub or gallop Pulses: 2+  and symmetric       Assessment & Plan:

## 2012-03-13 NOTE — Assessment & Plan Note (Signed)
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

## 2012-03-13 NOTE — Assessment & Plan Note (Signed)
Planning on bariatric surgery, appreciate [facility] input in the care of my patient. Will continue to follow.

## 2012-03-26 ENCOUNTER — Telehealth: Payer: Self-pay | Admitting: Family Medicine

## 2012-03-26 ENCOUNTER — Encounter: Payer: Self-pay | Admitting: Family Medicine

## 2012-03-26 MED ORDER — LEVOTHYROXINE SODIUM 125 MCG PO TABS: 125.0000 ug | ORAL_TABLET | Freq: Every day | ORAL | Status: DC

## 2012-03-26 MED ORDER — ATORVASTATIN CALCIUM 40 MG PO TABS: 40.0000 mg | ORAL_TABLET | Freq: Every day | ORAL | Status: DC

## 2012-03-26 NOTE — Telephone Encounter (Signed)
Called patient to discuss lab results- TSH high, she needs increased dose of levothyroxine.  She has 75 mcg tablets at home, I am increasing to 125 mcg.  She just got a new bottle, I told her she can take 1 1/2 tablets daily until it runs out then get the new sized pill.   Also, her cholesterol is high, recommend taking atorvastatin.  She has taken this in the past and it was stopped by the doctor who said she did not need it anymore.  I reviewed side effects of this medication.  I asked her to follow up in 1-2 months to re-check these labs.   CHAMBERLAIN,RACHEL 03/26/2012 3:33 PM

## 2012-03-31 ENCOUNTER — Telehealth: Payer: Self-pay | Admitting: Family Medicine

## 2012-03-31 NOTE — Telephone Encounter (Signed)
FMLA form placed in Dr. Melina Modena box for completion.  Ileana Ladd

## 2012-03-31 NOTE — Telephone Encounter (Signed)
Pt dropped off paper work to be filled out.  °

## 2012-03-31 NOTE — Telephone Encounter (Signed)
I cannot fill out this paperwork without a face to face encounter to address her short term disability.  Please let her know and ask her to make an appointment.   CHAMBERLAIN,RACHEL 03/31/2012 3:30 PM

## 2012-04-01 NOTE — Telephone Encounter (Signed)
Advised pt to sched OV. Pt agreed.

## 2012-04-02 ENCOUNTER — Telehealth: Payer: Self-pay | Admitting: Family Medicine

## 2012-04-02 MED ORDER — PRAVASTATIN SODIUM 80 MG PO TABS: 80.0000 mg | ORAL_TABLET | Freq: Every day | ORAL | Status: DC

## 2012-04-02 NOTE — Telephone Encounter (Signed)
Called pharmacy, co- pay for generic atorvastatin is $20 at [facility].   Rx for pravastatin sent in, please let her know.

## 2012-04-02 NOTE — Telephone Encounter (Signed)
Patient is requesting something comparable to Lipitor that is less expensive to be sent to [facility] in [place].  The copay for Lipitor is $50.

## 2012-04-02 NOTE — Telephone Encounter (Signed)
Advised pt of new med called to pharmacy

## 2012-04-02 NOTE — Telephone Encounter (Signed)
Will forward to Dr. Chamberlain 

## 2012-04-08 ENCOUNTER — Ambulatory Visit (INDEPENDENT_AMBULATORY_CARE_PROVIDER_SITE_OTHER): Payer: BC Managed Care – PPO | Admitting: Family Medicine

## 2012-04-08 ENCOUNTER — Encounter: Payer: Self-pay | Admitting: Family Medicine

## 2012-04-08 VITALS — BP 101/65 | HR 137 | Temp 98.1°F | Ht 63.0 in | Wt 212.0 lb

## 2012-04-08 DIAGNOSIS — Z0000 Encounter for general adult medical examination without abnormal findings: Secondary | ICD-10-CM | POA: Insufficient documentation

## 2012-04-08 DIAGNOSIS — Z0271 Encounter for disability determination: Secondary | ICD-10-CM

## 2012-04-08 NOTE — Progress Notes (Signed)
  Subjective:    Patient ID: [name], female    DOB: 07-16-1960, 52 y.o.   MRN: [id number]  HPI  [name] comes in for face to face encounter to discuss FMLA paperwork for her husband.  She says she has appointments every 3 months at the following doctor offices: Me (FM), Neurology, Nephrology, Hepatology, Pain Management, Opthalmology.  Since her hospitalization in Nov of 2012 where she had respiratory failure, encephalopathy, shock liver, acute renal failure, she has had days that she gets confused, has weakness and is unable to care for herself, drive, and is unsafe to be left alone.  They are less frequent than before- now maybe once every other month or so, but it used to be much more frequent.   Past Medical History  Diagnosis Date  . Allergy   . Arthritis   . Depression   . GERD (gastroesophageal reflux disease)   . Hyperlipidemia   . Hypertension   . CKD (chronic kidney disease) stage 3, GFR 30-59 ml/min   . Osteoporosis   . Hypothyroidism   . Anxiety   . Chronic pain syndrome   . Tobacco abuse   . Obesity   . Gastroparesis   . Ankylosing spondylitis   . Nephrolithiasis    Past Surgical History  Procedure Laterality Date  . Total knee arthroplasty      left  . Back surgery    . Neck surgery    . Appendectomy    . Cholecystectomy    . Right knee arthroplasty     Family History  Problem Relation Age of Onset  . Diabetes Mother   . Heart failure Mother   . Hyperlipidemia Mother   . Hypertension Mother   . Hyperlipidemia Father   . Cancer Son     lymphoma stage 4 at 30   History  Substance Use Topics  . Smoking status: Current Every Day Smoker -- 1.00 packs/day    Types: Cigarettes  . Smokeless tobacco: Not on file  . Alcohol Use: No   Review of Systems See HPI.      Objective:   Physical Exam BP 101/65  Pulse 137  Temp(Src) 98.1 F (36.7 C) (Oral)  Ht 5\' 3"  (1.6 m)  Wt 212 lb (96.163 kg)  BMI 37.56 kg/m2  LMP 05/17/2009 General appearance:  alert, cooperative and no distress       Assessment & Plan:

## 2012-04-08 NOTE — Patient Instructions (Signed)
It was good to see you.  Please let me know if you have any problems with the paperwork.

## 2012-04-08 NOTE — Assessment & Plan Note (Addendum)
FMLA paperwork filled out for patient's husband, who stays home with her and cares for her on days she cannot care for herself and drives her to doctor's visits on days she cannot drive.

## 2012-04-10 ENCOUNTER — Telehealth: Payer: Self-pay | Admitting: *Deleted

## 2012-04-10 ENCOUNTER — Encounter: Payer: Self-pay | Admitting: Family Medicine

## 2012-04-10 ENCOUNTER — Ambulatory Visit (INDEPENDENT_AMBULATORY_CARE_PROVIDER_SITE_OTHER): Payer: BC Managed Care – PPO | Admitting: Family Medicine

## 2012-04-10 VITALS — BP 134/87 | HR 102 | Temp 98.0°F | Ht 64.0 in | Wt 218.9 lb

## 2012-04-10 DIAGNOSIS — N183 Chronic kidney disease, stage 3 unspecified: Secondary | ICD-10-CM

## 2012-04-10 DIAGNOSIS — E039 Hypothyroidism, unspecified: Secondary | ICD-10-CM

## 2012-04-10 DIAGNOSIS — R631 Polydipsia: Secondary | ICD-10-CM

## 2012-04-10 LAB — COMPREHENSIVE METABOLIC PANEL
ALT: 24 U/L (ref 0–35)
Alkaline Phosphatase: 125 U/L — ABNORMAL HIGH (ref 39–117)
Sodium: 135 mEq/L (ref 135–145)
Total Bilirubin: 0.2 mg/dL — ABNORMAL LOW (ref 0.3–1.2)
Total Protein: 6.2 g/dL (ref 6.0–8.3)

## 2012-04-10 LAB — GLUCOSE, CAPILLARY: Comment 1: 5

## 2012-04-10 NOTE — Telephone Encounter (Signed)
Patient calls stating she has had problem with dry mouth for years but today is worse. She cannot quench  her thrist. Has  2 two liters soda , 2 glasses of tea and 2 glasses on milk and still is so thirsty .  Offered appointment today and she states she can be here in 45 minutes to one hour. Time now 3:12.  Put on Dr. Radonna Ricker schedule at 4:15.  .  Advised if she thinks she will not be able to get here by that time she can go to Urgent Care today.

## 2012-04-10 NOTE — Assessment & Plan Note (Signed)
Given patient's past history and anxiety, feel CMET is warranted.  Am more concerned about hyponatremia from excessive water intake than I am about liver function.  With listed h/o of ank-spondilosis and RA, might consider workup for Sjogren's syndrome given dry mouth.  For now will stop hydroxyzine and see if it helps symptoms.  Might also consider decreasing flexeril as it can cause dry mouth.  If patient is hyponatremic, will need to fluid restrict.

## 2012-04-10 NOTE — Patient Instructions (Signed)
We will check your thyroid, kidneys, and liver today.  You will be contacted this weekend if they are abnormal.  Otherwise, you will hear from [place] next week. Please stop taking your Hydroxyzine.  See if this helps with your dry mouth.  Flexeril can also cause dry mouth so, in the future, you might try decreasing the dose of that. Use throat lozenges and mucinex for your cough. Come back to see Dr. Lula Olszewski next week.

## 2012-04-10 NOTE — Progress Notes (Signed)
Patient ID: [name], female   DOB: 1960/04/08, 52 y.o.   MRN: [id number] Subjective: The patient is a 52 y.o. year old female who presents today for increased thirst.  Began about 3 days ago, accompanied by rhinorrhea, sore throat, and cough.  No fevers/chills/dysuria.  No LE swelling.  Has been drinking 1-2 gallons water per day and feels somewhat bloated.  No diarrhea.  No visual changes.  No SOB/CP.  Dry mouth but no dry eyes.  Has not been taking any medications.  Reports similar symptoms prior to her hospitalization in 11/2010 where she had AMS, AKI, and shock liver.  This concerns her.  Patient's past medical, social, and family history were reviewed and updated as appropriate. History  Substance Use Topics  . Smoking status: Current Every Day Smoker -- 1.00 packs/day    Types: Cigarettes  . Smokeless tobacco: Not on file  . Alcohol Use: No   Objective:  Filed Vitals:   04/10/12 1609  BP: 134/87  Pulse: 102  Temp: 98 F (36.7 C)   Gen: Anxious, obese HEENT: Slight adenopathy, slightly dry MM, clear rhinorrhe CV: RRR, no murmurs Resp: CTABL Abd: Obese, not particularly tender Ext: No edema  Assessment/Plan:  Please also see individual problems in problem list for problem-specific plans.

## 2012-04-14 ENCOUNTER — Telehealth: Payer: Self-pay | Admitting: Family Medicine

## 2012-04-14 NOTE — Telephone Encounter (Signed)
Please let patient know that her labs are all normal.  There are no problems with her kidneys or her liver.

## 2012-04-14 NOTE — Telephone Encounter (Signed)
Left message on voicemail. [name] S  

## 2012-05-27 ENCOUNTER — Emergency Department (HOSPITAL_COMMUNITY)
Admission: EM | Admit: 2012-05-27 | Discharge: 2012-05-27 | Disposition: A | Discharge status: Home / Self Care | Payer: BC Managed Care – PPO | Attending: Emergency Medicine | Admitting: Emergency Medicine

## 2012-05-27 ENCOUNTER — Encounter (HOSPITAL_COMMUNITY): Payer: Self-pay | Admitting: Emergency Medicine

## 2012-05-27 ENCOUNTER — Emergency Department (HOSPITAL_COMMUNITY): Payer: BC Managed Care – PPO

## 2012-05-27 DIAGNOSIS — Z8739 Personal history of other diseases of the musculoskeletal system and connective tissue: Secondary | ICD-10-CM | POA: Insufficient documentation

## 2012-05-27 DIAGNOSIS — E669 Obesity, unspecified: Secondary | ICD-10-CM | POA: Diagnosis not present

## 2012-05-27 DIAGNOSIS — Z8719 Personal history of other diseases of the digestive system: Secondary | ICD-10-CM | POA: Insufficient documentation

## 2012-05-27 DIAGNOSIS — K219 Gastro-esophageal reflux disease without esophagitis: Secondary | ICD-10-CM | POA: Insufficient documentation

## 2012-05-27 DIAGNOSIS — I129 Hypertensive chronic kidney disease with stage 1 through stage 4 chronic kidney disease, or unspecified chronic kidney disease: Secondary | ICD-10-CM | POA: Diagnosis not present

## 2012-05-27 DIAGNOSIS — F411 Generalized anxiety disorder: Secondary | ICD-10-CM | POA: Insufficient documentation

## 2012-05-27 DIAGNOSIS — F17200 Nicotine dependence, unspecified, uncomplicated: Secondary | ICD-10-CM | POA: Insufficient documentation

## 2012-05-27 DIAGNOSIS — G894 Chronic pain syndrome: Secondary | ICD-10-CM | POA: Diagnosis not present

## 2012-05-27 DIAGNOSIS — Z79899 Other long term (current) drug therapy: Secondary | ICD-10-CM | POA: Diagnosis not present

## 2012-05-27 DIAGNOSIS — Z91040 Latex allergy status: Secondary | ICD-10-CM | POA: Insufficient documentation

## 2012-05-27 DIAGNOSIS — Z87442 Personal history of urinary calculi: Secondary | ICD-10-CM | POA: Insufficient documentation

## 2012-05-27 DIAGNOSIS — R079 Chest pain, unspecified: Secondary | ICD-10-CM | POA: Insufficient documentation

## 2012-05-27 DIAGNOSIS — N183 Chronic kidney disease, stage 3 unspecified: Secondary | ICD-10-CM | POA: Insufficient documentation

## 2012-05-27 DIAGNOSIS — F3289 Other specified depressive episodes: Secondary | ICD-10-CM | POA: Insufficient documentation

## 2012-05-27 DIAGNOSIS — R002 Palpitations: Secondary | ICD-10-CM | POA: Diagnosis not present

## 2012-05-27 DIAGNOSIS — Z7982 Long term (current) use of aspirin: Secondary | ICD-10-CM | POA: Insufficient documentation

## 2012-05-27 DIAGNOSIS — F329 Major depressive disorder, single episode, unspecified: Secondary | ICD-10-CM | POA: Insufficient documentation

## 2012-05-27 DIAGNOSIS — E785 Hyperlipidemia, unspecified: Secondary | ICD-10-CM | POA: Insufficient documentation

## 2012-05-27 DIAGNOSIS — E039 Hypothyroidism, unspecified: Secondary | ICD-10-CM | POA: Insufficient documentation

## 2012-05-27 DIAGNOSIS — M810 Age-related osteoporosis without current pathological fracture: Secondary | ICD-10-CM | POA: Insufficient documentation

## 2012-05-27 LAB — BASIC METABOLIC PANEL
Chloride: 98 mEq/L (ref 96–112)
GFR calc Af Amer: 58 mL/min — ABNORMAL LOW (ref >90)
GFR calc non Af Amer: 50 mL/min — ABNORMAL LOW (ref >90)
Potassium: 4.4 mEq/L (ref 3.5–5.1)
Sodium: 138 mEq/L (ref 135–145)

## 2012-05-27 LAB — PRO B NATRIURETIC PEPTIDE: Pro B Natriuretic peptide (BNP): 52.9 pg/mL (ref 0–125)

## 2012-05-27 LAB — POCT I-STAT TROPONIN I: Troponin i, poc: 0 ng/mL (ref 0.00–0.08)

## 2012-05-27 LAB — TSH: TSH: 2.093 u[IU]/mL (ref 0.350–4.500)

## 2012-05-27 LAB — CBC
HCT: 42 % (ref 36.0–46.0)
MCHC: 35 g/dL (ref 30.0–36.0)
RDW: 14.4 % (ref 11.5–15.5)
WBC: 9.5 10*3/uL (ref 4.0–10.5)

## 2012-05-27 MED ORDER — ASPIRIN 325 MG PO TABS
325.0000 mg | ORAL_TABLET | ORAL | Status: AC
  Administered 2012-05-27: 325 mg via ORAL
  Filled 2012-05-27: qty 1

## 2012-05-27 MED ORDER — NITROGLYCERIN 0.4 MG SL SUBL
0.4000 mg | SUBLINGUAL_TABLET | SUBLINGUAL | Status: DC | PRN
  Filled 2012-05-27: qty 25

## 2012-05-27 MED ORDER — METOPROLOL SUCCINATE 12.5 MG HALF TABLET: 12.5000 mg | ORAL_TABLET | Freq: Every day | ORAL | Status: DC

## 2012-05-27 MED ORDER — ALUM & MAG HYDROXIDE-SIMETH 200-200-20 MG/5ML PO SUSP
30.0000 mL | Freq: Once | ORAL | Status: AC
  Administered 2012-05-27: 30 mL via ORAL
  Filled 2012-05-27: qty 30

## 2012-05-27 NOTE — ED Notes (Signed)
MD at bedside. 

## 2012-05-27 NOTE — ED Provider Notes (Signed)
History     CSN: [id number]  Arrival date & time 05/27/12  0434   First MD Initiated Contact with Patient 05/27/12 [phone]      Chief Complaint  Patient presents with  . Chest Pain     The history is provided by the patient.   patient reports intermittent palpitations over the past several weeks.  Tonight her palpitations became more severe and she didn't develop some mid chest tightness with shortness of breath and nausea.  No vomiting.  She reports that her chest pain is significantly improved at this time.  She does drink [facility] but denies other caffeinated beverages.  No recent cold or cough medicine.  She denies drinking tea.  She has never seen a cardiologist regarding her palpitations over the past several weeks.  She does report a stress test that was normal several years ago.  She's not sure which cardiologist she saw.     Past Medical History  Diagnosis Date  . Allergy   . Arthritis   . Depression   . GERD (gastroesophageal reflux disease)   . Hyperlipidemia   . Hypertension   . CKD (chronic kidney disease) stage 3, GFR 30-59 ml/min   . Osteoporosis   . Hypothyroidism   . Anxiety   . Chronic pain syndrome   . Tobacco abuse   . Obesity   . Gastroparesis   . Ankylosing spondylitis   . Nephrolithiasis     Past Surgical History  Procedure Laterality Date  . Total knee arthroplasty      left  . Back surgery    . Neck surgery    . Appendectomy    . Cholecystectomy    . Right knee arthroplasty      Family History  Problem Relation Age of Onset  . Diabetes Mother   . Heart failure Mother   . Hyperlipidemia Mother   . Hypertension Mother   . Hyperlipidemia Father   . Cancer Son     lymphoma stage 4 at 30    History  Substance Use Topics  . Smoking status: Current Every Day Smoker -- 1.00 packs/day    Types: Cigarettes  . Smokeless tobacco: Not on file  . Alcohol Use: No    OB History   Grav Para Term Preterm Abortions TAB SAB Ect Mult Living                   Review of Systems  All other systems reviewed and are negative.    Allergies  Codeine and Latex  Home Medications   Current Outpatient Rx  Name  Route  Sig  Dispense  Refill  . ALPRAZolam (XANAX) 0.25 MG tablet   Oral   Take 0.25 mg by mouth at bedtime as needed for sleep.         Marland Kitchen aspirin EC 81 MG tablet   Oral   Take 81 mg by mouth daily.           . cholecalciferol (VITAMIN D) 1000 UNITS tablet   Oral   Take 1,000 Units by mouth daily.         . colchicine (COLCRYS) 0.6 MG tablet   Oral   Take 1 tablet (0.6 mg total) by mouth daily.   30 tablet   11   . cyclobenzaprine (FLEXERIL) 10 MG tablet   Oral   Take 0.5 tablets (5 mg total) by mouth 3 times/day as needed-between meals & bedtime. For muscle spasms   30  tablet   3   . diclofenac sodium (VOLTAREN) 1 % GEL   Topical   Apply 2 g topically 4 (four) times daily.   100 g   11   . docusate sodium (COLACE) 100 MG capsule   Oral   Take 100 mg by mouth 2 (two) times daily.         Marland Kitchen FERROUS SULFATE PO   Oral   Take 1 tablet by mouth daily.          Marland Kitchen gabapentin (NEURONTIN) 100 MG capsule   Oral   Take 200 mg by mouth 2 (two) times daily.          . lansoprazole (PREVACID) 30 MG capsule   Oral   Take 1 capsule (30 mg total) by mouth daily.   30 capsule   11   . levothyroxine (SYNTHROID, LEVOTHROID) 125 MCG tablet   Oral   Take 1 tablet (125 mcg total) by mouth daily.   30 tablet   11   . oxyCODONE (OXYCONTIN) 20 MG 12 hr tablet   Oral   Take 20 mg by mouth every 12 (twelve) hours.           . polyethylene glycol (MIRALAX / GLYCOLAX) packet   Oral   Take 17 g by mouth daily.         . pravastatin (PRAVACHOL) 80 MG tablet   Oral   Take 1 tablet (80 mg total) by mouth daily.   30 tablet   5     BP 126/90  Pulse 91  Temp(Src) 98.3 F (36.8 C) (Oral)  Resp 24  SpO2 86%  LMP 05/17/2009  Physical Exam  Nursing note and vitals  reviewed. Constitutional: She is oriented to person, place, and time. She appears well-developed and well-nourished. No distress.  HENT:  Head: Normocephalic and atraumatic.  Eyes: EOM are normal.  Neck: Normal range of motion.  Cardiovascular: Normal rate, regular rhythm and normal heart sounds.   Pulmonary/Chest: Effort normal and breath sounds normal.  Abdominal: Soft. She exhibits no distension. There is no tenderness.  Musculoskeletal: Normal range of motion.  Neurological: She is alert and oriented to person, place, and time.  Skin: Skin is warm and dry.  Psychiatric: She has a normal mood and affect. Judgment normal.    ED Course  Procedures (including critical care time)   Date: 05/27/2012  Rate: 83  Rhythm: normal sinus rhythm  QRS Axis: normal  Intervals: normal  ST/T Wave abnormalities: normal  Conduction Disutrbances: none  Narrative Interpretation:   Old EKG Reviewed: No significant changes noted     Labs Reviewed  BASIC METABOLIC PANEL - Abnormal; Notable for the following:    Creatinine, Ser 1.22 (*)    GFR calc non Af Amer 50 (*)    GFR calc Af Amer 58 (*)    All other components within normal limits  CBC  PRO B NATRIURETIC PEPTIDE  TROPONIN I  TSH  T4, FREE  POCT I-STAT TROPONIN I   Dg Chest Port 1 View  05/27/2012  *RADIOLOGY REPORT*  Clinical Data: 52 year old female with chest pain and hypertension.  PORTABLE CHEST - 1 VIEW  Comparison: 12/21/2010 and earlier.  Findings: AP portable upright view 0527 hours.  Improved lung volumes.  Allowing for portable technique, the lungs are clear.  No pneumothorax or effusion. Normal cardiac size and mediastinal contours.  Visualized tracheal air column is within normal limits. Partially visible cervical ACDF hardware.  IMPRESSION: Negative,  no acute cardiopulmonary abnormality.   Original Report Authenticated By: Erskine Speed, M.D.    I personally reviewed the imaging tests through PACS system I reviewed  available ER/hospitalization records through the EMR   1. Palpitations   2. Chest pain       MDM  Patient's had troponin negative x2 in emergency apartment.  Her EKG is normal.  She's had palpitations.  She'll need outpatient cardiology followup.  She will need Holter placement.  Thyroid studies are pending.  I recommended that she stop drinking [facility] and any other caffeinated drinks.  I think this is playing a role.  The patient be placed on a low-dose beta blocker and instructed to followup both with cardiology and her primary care physician.  She understands to return to the ER for new or worsening symptoms.  Doubt malignant arrhythmia.  No syncope.        Lyanne Co, MD 05/27/12 [phone]

## 2012-05-27 NOTE — ED Notes (Signed)
PT. REPORTS MID CHEST PAIN ONSET THIS EVENING WITH NAUSEA AND SOB , DENIES COUGH /EMESIS . SLIGHT DIAPHORESIS . NO MEDS PTA . RATES PAIN 6/10 .

## 2012-06-03 ENCOUNTER — Ambulatory Visit: Payer: BC Managed Care – PPO | Admitting: Family Medicine

## 2012-06-04 ENCOUNTER — Ambulatory Visit: Payer: BC Managed Care – PPO | Admitting: Cardiology

## 2012-06-07 ENCOUNTER — Encounter: Payer: Self-pay | Admitting: *Deleted

## 2012-06-10 ENCOUNTER — Ambulatory Visit (INDEPENDENT_AMBULATORY_CARE_PROVIDER_SITE_OTHER): Payer: BC Managed Care – PPO | Admitting: Cardiology

## 2012-06-10 ENCOUNTER — Encounter: Payer: Self-pay | Admitting: Cardiology

## 2012-06-10 ENCOUNTER — Ambulatory Visit (INDEPENDENT_AMBULATORY_CARE_PROVIDER_SITE_OTHER): Payer: BC Managed Care – PPO | Admitting: Family Medicine

## 2012-06-10 VITALS — BP 118/70 | HR 88 | Temp 98.1°F | Ht 64.0 in | Wt 216.2 lb

## 2012-06-10 VITALS — BP 100/68 | HR 80 | Ht 64.0 in | Wt 215.0 lb

## 2012-06-10 DIAGNOSIS — M1710 Unilateral primary osteoarthritis, unspecified knee: Secondary | ICD-10-CM | POA: Diagnosis not present

## 2012-06-10 DIAGNOSIS — IMO0001 Reserved for inherently not codable concepts without codable children: Secondary | ICD-10-CM | POA: Diagnosis not present

## 2012-06-10 DIAGNOSIS — R5383 Other fatigue: Secondary | ICD-10-CM | POA: Diagnosis not present

## 2012-06-10 DIAGNOSIS — Z7189 Other specified counseling: Secondary | ICD-10-CM

## 2012-06-10 DIAGNOSIS — R002 Palpitations: Secondary | ICD-10-CM

## 2012-06-10 DIAGNOSIS — G4700 Insomnia, unspecified: Secondary | ICD-10-CM

## 2012-06-10 DIAGNOSIS — R7989 Other specified abnormal findings of blood chemistry: Secondary | ICD-10-CM | POA: Diagnosis not present

## 2012-06-10 DIAGNOSIS — K5900 Constipation, unspecified: Secondary | ICD-10-CM | POA: Insufficient documentation

## 2012-06-10 DIAGNOSIS — R5381 Other malaise: Secondary | ICD-10-CM | POA: Diagnosis not present

## 2012-06-10 DIAGNOSIS — R0789 Other chest pain: Secondary | ICD-10-CM

## 2012-06-10 MED ORDER — ZOLPIDEM TARTRATE 5 MG PO TABS
5.0000 mg | ORAL_TABLET | Freq: Every evening | ORAL | Status: DC | PRN
Start: 2012-06-10 — End: 2012-07-08

## 2012-06-10 MED ORDER — GLYCERIN (LAXATIVE) 2 G RE SUPP: 1.0000 | Freq: Two times a day (BID) | RECTAL | Status: DC | PRN

## 2012-06-10 MED ORDER — POLYETHYLENE GLYCOL 3350 17 GM/SCOOP PO POWD: 17.0000 g | Freq: Four times a day (QID) | ORAL | Status: DC

## 2012-06-10 MED ORDER — NICOTINE 21 MG/24HR TD PT24: 1.0000 | MEDICATED_PATCH | TRANSDERMAL | Status: DC

## 2012-06-10 NOTE — Assessment & Plan Note (Signed)
Again most likely PACs or PVCs. Well-controlled now on beta blocker. At this time I think would discontinue the beta blocker until late return or get worse. We may in the future to increase her dose to full 25 mg, but for now we'll just keep a 12.5 mg and Toprol.

## 2012-06-10 NOTE — Patient Instructions (Signed)
It was good to see you.  Please try the ambien at bedtime for sleep.   Please try the glycerin suppositories and Miralax four times daily for your constipation.   I will ask our health coach to call you to offer support for quitting smoking.

## 2012-06-10 NOTE — Assessment & Plan Note (Signed)
Rx for nicotine patch- strongly urged to quit.  Will also ask our Health coach to call her to offer support.

## 2012-06-10 NOTE — Assessment & Plan Note (Signed)
These symptoms are relatively similar to what she had a couple years ago. Evaluated with a nuclear stress test that time as negative for ischemia. Her symptoms are almost all reproducible with light palpation underneath and above her left breast. This is most likely consistent with costochondritis.   Usually in this case would use and states and/or steroids. However, based on her history of inflammatory arthritis, I would like to defer choices of treatment for her inflammatory pain to her rheumatologist, Dr. Corliss Skains.

## 2012-06-10 NOTE — Assessment & Plan Note (Signed)
Improved with metoprolol, suspect SVT- she will see Cardiology for consideration of further testing today.

## 2012-06-10 NOTE — Progress Notes (Signed)
  Subjective:    Patient ID: [name], female    DOB: [birth year], 52 y.o.   MRN: [id number]  HPI:  [name] comes in for follow up.   Palpitations: Was in ER 5/7 with palpitations and chest discomfort- says the palpitations have been going on for years but were getting worse and when she would lay down at night she was afraid she was going to die because of them.  She was drinking a lot of soda, which she has completely stopped due to ER doc's recommendation.  They started her on metoprolol which has helped quite a bit.   Insomina: Not sleeping, which makes her smoke more.  Palpitations made it worse, but they are better now and still not able to sleep.  Denies depressive symptoms, has tried OTC sleep aids without improvement.   Constipation: Has not had a bowel movement is 2-3 weeks.  Is taking Mirilax and OTC colace. Says she feels bloated, but no severe abdominal pain.   Smoking: Smokes 1 ppd, has for 30 years.  Wants to quit, is sick of how it makes her feel, sick of the expense.  Says she wants to try the patch, is also agreeable to speaking with our health coach.   Past Medical History  Diagnosis Date  . Allergy   . Arthritis   . Depression   . GERD (gastroesophageal reflux disease)   . Hyperlipidemia   . Hypertension   . Osteoporosis   . Hypothyroidism   . Anxiety   . Chronic pain syndrome   . Tobacco abuse   . Obesity   . Gastroparesis   . Ankylosing spondylitis   . Depression   . CKD (chronic kidney disease) stage 3, GFR 30-59 ml/min   . Nephrolithiasis     History  Substance Use Topics  . Smoking status: Current Every Day Smoker -- 1.00 packs/day    Types: Cigarettes  . Smokeless tobacco: Not on file  . Alcohol Use: No    Family History  Problem Relation Age of Onset  . Diabetes Mother   . Heart failure Mother   . Hyperlipidemia Mother   . Hypertension Mother   . Hyperlipidemia Father   . Cancer Son     lymphoma stage 4 at 30     ROS Pertinent  items in HPI    Objective:  Physical Exam:  BP 118/70  Pulse 88  Temp(Src) 98.1 F (36.7 C) (Oral)  Ht 5\' 4"  (1.626 m)  Wt 216 lb 3 oz (98.062 kg)  BMI 37.09 kg/m2  LMP 05/17/2009 General appearance: alert, cooperative and no distress Head: Normocephalic, without obvious abnormality, atraumatic Lungs: clear to auscultation bilaterally Heart: regular rate and rhythm, S1, S2 normal, no murmur, click, rub or gallop Pulses: 2+ and symmetric       Assessment & Plan:

## 2012-06-10 NOTE — Patient Instructions (Addendum)
It sounds like your palpitations are well controlled with the new medicine that the emergency room doctor started - the medicine is called metoprolol succinate (Toprol).  This medicine helps decrease those extra heartbeats which you were feeling. It does this by decreasing effect of adrenaline in your heart. The chest discomfort you're having is most likely inflammation along the chest wall and muscles. I will be in communication with Dr. Corliss Skains, to ask her advice on how we treat this as it probably is related to your inflammatory arthritis.  Your stress test that was done a couple years ago did not show any signs of any heart artery blockages.  We will followup in about 6 months to see how palpitations are doing. I would not be surprised if we need to increase the dose of Toprol.  Marykay Lex, MD

## 2012-06-10 NOTE — Assessment & Plan Note (Signed)
Will increase mirilax, also Rx for glycerin suppositories.  Discussed narcotics as likely cause, encouraged increase fiber and water intake.
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
## 2012-06-10 NOTE — Assessment & Plan Note (Signed)
Rx for [place] for short term use.

## 2012-06-18 ENCOUNTER — Encounter: Payer: BC Managed Care – PPO | Admitting: Cardiovascular Disease

## 2012-07-07 ENCOUNTER — Telehealth: Payer: Self-pay | Admitting: Family Medicine

## 2012-07-07 NOTE — Telephone Encounter (Signed)
Will fwd to Md.  Sindia Kowalczyk L, CMA  

## 2012-07-07 NOTE — Telephone Encounter (Signed)
Patient was seen by Lula Olszewski on 06/10/12. States doctor was going to give her a script for Ambien to help her sleep, but she never received the rx and is calling because she is still having trouble sleeping and wants a script.

## 2012-07-07 NOTE — Telephone Encounter (Signed)
Per Epic records, a prescription was written.  Our clinic policy is that we are not responsible for lost controlled substance prescriptions.  Please notify the patient.

## 2012-07-07 NOTE — Telephone Encounter (Signed)
Lm for patient see MD note below.  Also, told pt if she is still having trouble sleeping she needs to schedule an OV.  Lashai Grosch, Darlyne Russian, CMA

## 2012-07-08 ENCOUNTER — Telehealth: Payer: Self-pay | Admitting: *Deleted

## 2012-07-08 MED ORDER — ZOLPIDEM TARTRATE 5 MG PO TABS: 5.0000 mg | ORAL_TABLET | Freq: Every evening | ORAL | Status: DC | PRN

## 2012-07-08 NOTE — Telephone Encounter (Signed)
Spoke with [facility] and patient has never had the Ambien prescription filled, nor was it ever called in to pharmacy.  Attempting to call patient again, but no answer so left message on voice mail.  Sianna Garofano, Darlyne Russian, CMA

## 2012-07-08 NOTE — Telephone Encounter (Signed)
Please phone in Ambien, 5 mg PO QHS PRN, #15, Refill=1.   Ralphael Southgate 07/08/2012 4:55 PM

## 2012-07-09 NOTE — Telephone Encounter (Signed)
Rx phoned in per MD.  Radene Ou, CMA

## 2012-08-11 ENCOUNTER — Telehealth: Payer: Self-pay | Admitting: Cardiology

## 2012-08-11 MED ORDER — METOPROLOL SUCCINATE ER 25 MG PO TB24: 12.5000 mg | ORAL_TABLET | Freq: Every day | ORAL | Status: DC

## 2012-08-11 NOTE — Telephone Encounter (Signed)
Patient called in refill for Metoprolol 2 weeks ago.  [facility] has sent to [place] twice.... Patient has not taken any Metoprolol in two weeks.  States her heart is pounding.

## 2012-08-11 NOTE — Telephone Encounter (Signed)
Confirmed with Deedie that pt called and not pharmacy.  Returned call to pt.  Pt stated she needs a refill on her HR medicine.  Pt spelled "metoprolol ER succinate."  Pt informed requests have not been received and RN will send in refill now.  Pt verbalized understanding and agreed w/ plan.  Refill(s) sent to pharmacy.

## 2012-08-28 ENCOUNTER — Encounter: Payer: Self-pay | Admitting: Family Medicine

## 2012-08-28 ENCOUNTER — Emergency Department (HOSPITAL_COMMUNITY)
Admission: EM | Admit: 2012-08-28 | Discharge: 2012-08-28 | Disposition: A | Discharge status: Home / Self Care | Payer: BC Managed Care – PPO | Attending: Emergency Medicine | Admitting: Emergency Medicine

## 2012-08-28 ENCOUNTER — Telehealth: Payer: Self-pay | Admitting: Family Medicine

## 2012-08-28 ENCOUNTER — Encounter (HOSPITAL_COMMUNITY): Payer: Self-pay | Admitting: *Deleted

## 2012-08-28 ENCOUNTER — Ambulatory Visit (INDEPENDENT_AMBULATORY_CARE_PROVIDER_SITE_OTHER): Payer: BC Managed Care – PPO | Admitting: Family Medicine

## 2012-08-28 ENCOUNTER — Emergency Department (HOSPITAL_COMMUNITY): Payer: BC Managed Care – PPO

## 2012-08-28 VITALS — BP 145/79 | HR 94 | Temp 99.3°F

## 2012-08-28 DIAGNOSIS — Z79899 Other long term (current) drug therapy: Secondary | ICD-10-CM | POA: Insufficient documentation

## 2012-08-28 DIAGNOSIS — N183 Chronic kidney disease, stage 3 unspecified: Secondary | ICD-10-CM | POA: Diagnosis not present

## 2012-08-28 DIAGNOSIS — Z87442 Personal history of urinary calculi: Secondary | ICD-10-CM | POA: Insufficient documentation

## 2012-08-28 DIAGNOSIS — Z8719 Personal history of other diseases of the digestive system: Secondary | ICD-10-CM | POA: Insufficient documentation

## 2012-08-28 DIAGNOSIS — E039 Hypothyroidism, unspecified: Secondary | ICD-10-CM | POA: Diagnosis not present

## 2012-08-28 DIAGNOSIS — K219 Gastro-esophageal reflux disease without esophagitis: Secondary | ICD-10-CM | POA: Diagnosis not present

## 2012-08-28 DIAGNOSIS — E669 Obesity, unspecified: Secondary | ICD-10-CM | POA: Diagnosis not present

## 2012-08-28 DIAGNOSIS — M129 Arthropathy, unspecified: Secondary | ICD-10-CM | POA: Insufficient documentation

## 2012-08-28 DIAGNOSIS — I129 Hypertensive chronic kidney disease with stage 1 through stage 4 chronic kidney disease, or unspecified chronic kidney disease: Secondary | ICD-10-CM | POA: Diagnosis not present

## 2012-08-28 DIAGNOSIS — R112 Nausea with vomiting, unspecified: Secondary | ICD-10-CM | POA: Insufficient documentation

## 2012-08-28 DIAGNOSIS — E785 Hyperlipidemia, unspecified: Secondary | ICD-10-CM | POA: Insufficient documentation

## 2012-08-28 DIAGNOSIS — M810 Age-related osteoporosis without current pathological fracture: Secondary | ICD-10-CM | POA: Insufficient documentation

## 2012-08-28 DIAGNOSIS — R109 Unspecified abdominal pain: Secondary | ICD-10-CM | POA: Diagnosis not present

## 2012-08-28 DIAGNOSIS — F329 Major depressive disorder, single episode, unspecified: Secondary | ICD-10-CM | POA: Insufficient documentation

## 2012-08-28 DIAGNOSIS — G8929 Other chronic pain: Secondary | ICD-10-CM | POA: Insufficient documentation

## 2012-08-28 DIAGNOSIS — Z87891 Personal history of nicotine dependence: Secondary | ICD-10-CM | POA: Diagnosis not present

## 2012-08-28 DIAGNOSIS — F3289 Other specified depressive episodes: Secondary | ICD-10-CM | POA: Insufficient documentation

## 2012-08-28 DIAGNOSIS — R509 Fever, unspecified: Secondary | ICD-10-CM | POA: Insufficient documentation

## 2012-08-28 DIAGNOSIS — Z8739 Personal history of other diseases of the musculoskeletal system and connective tissue: Secondary | ICD-10-CM | POA: Diagnosis not present

## 2012-08-28 DIAGNOSIS — K5900 Constipation, unspecified: Secondary | ICD-10-CM | POA: Insufficient documentation

## 2012-08-28 DIAGNOSIS — Z91040 Latex allergy status: Secondary | ICD-10-CM | POA: Diagnosis not present

## 2012-08-28 DIAGNOSIS — Z7982 Long term (current) use of aspirin: Secondary | ICD-10-CM | POA: Diagnosis not present

## 2012-08-28 DIAGNOSIS — M459 Ankylosing spondylitis of unspecified sites in spine: Secondary | ICD-10-CM | POA: Insufficient documentation

## 2012-08-28 DIAGNOSIS — F411 Generalized anxiety disorder: Secondary | ICD-10-CM | POA: Diagnosis not present

## 2012-08-28 DIAGNOSIS — Z9089 Acquired absence of other organs: Secondary | ICD-10-CM | POA: Insufficient documentation

## 2012-08-28 DIAGNOSIS — Z3202 Encounter for pregnancy test, result negative: Secondary | ICD-10-CM | POA: Insufficient documentation

## 2012-08-28 DIAGNOSIS — G894 Chronic pain syndrome: Secondary | ICD-10-CM | POA: Insufficient documentation

## 2012-08-28 LAB — COMPREHENSIVE METABOLIC PANEL
AST: 18 U/L (ref 0–37)
CO2: 28 mEq/L (ref 19–32)
Calcium: 9.2 mg/dL (ref 8.4–10.5)
Creatinine, Ser: 1.08 mg/dL (ref 0.50–1.10)
GFR calc non Af Amer: 58 mL/min — ABNORMAL LOW (ref >90)
Total Protein: 7.2 g/dL (ref 6.0–8.3)

## 2012-08-28 LAB — URINALYSIS, ROUTINE W REFLEX MICROSCOPIC
Glucose, UA: NEGATIVE mg/dL
Hgb urine dipstick: NEGATIVE
Ketones, ur: NEGATIVE mg/dL
Leukocytes, UA: NEGATIVE
Protein, ur: NEGATIVE mg/dL
Urobilinogen, UA: 0.2 mg/dL (ref 0.0–1.0)

## 2012-08-28 LAB — CBC WITH DIFFERENTIAL/PLATELET
Basophils Absolute: 0 10*3/uL (ref 0.0–0.1)
Basophils Absolute: 0 10*3/uL (ref 0.0–0.1)
Basophils Relative: 0 % (ref 0–1)
Eosinophils Absolute: 0.4 10*3/uL (ref 0.0–0.7)
Eosinophils Absolute: 0 10*3/uL (ref 0.0–0.7)
Eosinophils Relative: 3 % (ref 0–5)
Eosinophils Relative: 0 % (ref 0–5)
HCT: 45.3 % (ref 36.0–46.0)
HCT: 45.4 % (ref 36.0–46.0)
Lymphocytes Relative: 42 % (ref 12–46)
Lymphocytes Relative: 19 % (ref 12–46)
MCH: 31.2 pg (ref 26.0–34.0)
MCH: 31.6 pg (ref 26.0–34.0)
MCHC: 33.1 g/dL (ref 30.0–36.0)
MCHC: 34.4 g/dL (ref 30.0–36.0)
MCV: 94.2 fL (ref 78.0–100.0)
MCV: 91.9 fL (ref 78.0–100.0)
Monocytes Absolute: 0.6 10*3/uL (ref 0.1–1.0)
Monocytes Absolute: 0.5 10*3/uL (ref 0.1–1.0)
Platelets: 291 10*3/uL (ref 150–400)
Platelets: 319 10*3/uL (ref 150–400)
RDW: 14.2 % (ref 11.5–15.5)
RDW: 14.1 % (ref 11.5–15.5)
WBC: 11.8 10*3/uL — ABNORMAL HIGH (ref 4.0–10.5)

## 2012-08-28 LAB — POCT URINALYSIS DIPSTICK
Ketones, UA: NEGATIVE
Protein, UA: 30
Spec Grav, UA: 1.02
Urobilinogen, UA: 0.2
pH, UA: 7

## 2012-08-28 LAB — BASIC METABOLIC PANEL
CO2: 29 mEq/L (ref 19–32)
Calcium: 9.1 mg/dL (ref 8.4–10.5)
Creatinine, Ser: 1.25 mg/dL — ABNORMAL HIGH (ref 0.50–1.10)
GFR calc non Af Amer: 49 mL/min — ABNORMAL LOW (ref >90)
Sodium: 138 mEq/L (ref 135–145)

## 2012-08-28 MED ORDER — HYDROMORPHONE HCL PF 1 MG/ML IJ SOLN
1.0000 mg | Freq: Once | INTRAMUSCULAR | Status: AC
  Administered 2012-08-28: 1 mg via INTRAVENOUS
  Filled 2012-08-28: qty 1

## 2012-08-28 MED ORDER — MORPHINE SULFATE 4 MG/ML IJ SOLN
4.0000 mg | Freq: Once | INTRAMUSCULAR | Status: AC
  Administered 2012-08-28: 4 mg via INTRAVENOUS
  Filled 2012-08-28: qty 1

## 2012-08-28 MED ORDER — ONDANSETRON HCL 4 MG/2ML IJ SOLN
4.0000 mg | Freq: Once | INTRAMUSCULAR | Status: AC
  Administered 2012-08-28: 4 mg via INTRAVENOUS
  Filled 2012-08-28: qty 2

## 2012-08-28 MED ORDER — DIAZEPAM 5 MG PO TABS
5.0000 mg | ORAL_TABLET | Freq: Once | ORAL | Status: AC
  Administered 2012-08-28: 5 mg via ORAL
  Filled 2012-08-28: qty 1

## 2012-08-28 MED ORDER — SODIUM CHLORIDE 0.9 % IV BOLUS (SEPSIS)
1000.0000 mL | Freq: Once | INTRAVENOUS | Status: AC
  Administered 2012-08-28: 1000 mL via INTRAVENOUS
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
## 2012-08-28 NOTE — Telephone Encounter (Signed)
Patient is in severe pain on left side and went to ER.  She has an appt at [facility] this afternoon and plans to keep that appt, but her pain is so bad she thinks her Kidney doctor needs to get involved but she can't remember who that doctor is.  This pain is on the side where the kidney that isn't functioning well is so she feels certain that there is something wrong with that kidney but all of the test done at the ER show nothing is wrong.
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
## 2012-08-28 NOTE — ED Notes (Signed)
Left flank pain since Tuesday which pt states has became more intense. Pt is uncooperative due to pain.

## 2012-08-28 NOTE — ED Notes (Signed)
Lt flank pain for 4 days.  She was sent here from [facility].  She had urine there.  Her lt kidney does not work for ?? Reason the pt does not know
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
## 2012-08-28 NOTE — ED Notes (Signed)
MD at bedside. Dr. Herma Carson.

## 2012-08-28 NOTE — Telephone Encounter (Signed)
FYI.. It seems that she did go to the ED @ [facility]. Takeria Marquina, Maryjo Rochester

## 2012-08-30 NOTE — ED Provider Notes (Signed)
Medical screening examination/treatment/procedure(s) were conducted as a shared visit with non-physician practitioner(s) or resident  and myself.  I personally evaluated the patient during the encounter and agree with the findings and plan unless otherwise indicated.  Left flank pain on exam.  Clinically MSK.  Neuro vasc normal LE bilateral.  No focal abd pain on exam. Pt recently had CT scan.  Discussed r/ b of performing with IV contrast.  Pt and I both agree that it is wise to hold since her pain is msk on exam and single kidney.  Strict reasons to return. Improved in ED.    Enid Skeens, MD 08/30/12 [phone]

## 2012-09-16 ENCOUNTER — Ambulatory Visit: Payer: BC Managed Care – PPO | Admitting: Family Medicine

## 2012-09-22 ENCOUNTER — Ambulatory Visit: Payer: BC Managed Care – PPO | Admitting: Family Medicine

## 2012-10-06 ENCOUNTER — Other Ambulatory Visit: Payer: Self-pay | Admitting: Family Medicine

## 2012-10-06 NOTE — Telephone Encounter (Signed)
Both of these medications appear to have been for short term use. The patient will need to be seen prior to refills being given. Please inform the patient.

## 2012-10-07 NOTE — Telephone Encounter (Signed)
Pt states that she has been on Flexeril for 10 years.  Normally has it filled by Dr. Reginia Forts.  Informed her that she would need to contact pharmacy and let them know to request it from that office.  Pt had it filled here once between visits.  Jazmin Hartsell,CMA

## 2012-10-13 ENCOUNTER — Encounter: Payer: Self-pay | Admitting: Family Medicine

## 2012-10-13 ENCOUNTER — Ambulatory Visit (INDEPENDENT_AMBULATORY_CARE_PROVIDER_SITE_OTHER): Payer: BC Managed Care – PPO | Admitting: Family Medicine

## 2012-10-13 VITALS — BP 163/96 | HR 105 | Temp 98.3°F | Wt 221.0 lb

## 2012-10-13 DIAGNOSIS — G4700 Insomnia, unspecified: Secondary | ICD-10-CM | POA: Diagnosis not present

## 2012-10-13 DIAGNOSIS — R109 Unspecified abdominal pain: Secondary | ICD-10-CM | POA: Diagnosis not present

## 2012-10-13 DIAGNOSIS — M109 Gout, unspecified: Secondary | ICD-10-CM | POA: Diagnosis not present

## 2012-10-13 MED ORDER — CYCLOBENZAPRINE HCL 10 MG PO TABS: 5.0000 mg | ORAL_TABLET | Freq: Every day | ORAL | Status: DC

## 2012-10-13 MED ORDER — ALLOPURINOL 100 MG PO TABS: 100.0000 mg | ORAL_TABLET | Freq: Every morning | ORAL | Status: DC

## 2012-10-13 MED ORDER — TRAZODONE HCL 50 MG PO TABS: 25.0000 mg | ORAL_TABLET | Freq: Every evening | ORAL | Status: DC | PRN

## 2012-10-13 NOTE — Patient Instructions (Addendum)
Back Exercises These exercises may help you when beginning to rehabilitate your injury. Your symptoms may resolve with or without further involvement from your physician, physical therapist or athletic trainer. While completing these exercises, remember:   Restoring tissue flexibility helps normal motion to return to the joints. This allows healthier, less painful movement and activity.  An effective stretch should be held for at least 30 seconds.  A stretch should never be painful. You should only feel a gentle lengthening or release in the stretched tissue. STRETCH  Extension, Prone on Elbows   Lie on your stomach on the floor, a bed will be too soft. Place your palms about shoulder width apart and at the height of your head.  Place your elbows under your shoulders. If this is too painful, stack pillows under your chest.  Allow your body to relax so that your hips drop lower and make contact more completely with the floor.  Hold this position for __________ seconds.  Slowly return to lying flat on the floor. Repeat __________ times. Complete this exercise __________ times per day.  RANGE OF MOTION  Extension, Prone Press Ups   Lie on your stomach on the floor, a bed will be too soft. Place your palms about shoulder width apart and at the height of your head.  Keeping your back as relaxed as possible, slowly straighten your elbows while keeping your hips on the floor. You may adjust the placement of your hands to maximize your comfort. As you gain motion, your hands will come more underneath your shoulders.  Hold this position __________ seconds.  Slowly return to lying flat on the floor. Repeat __________ times. Complete this exercise __________ times per day.  RANGE OF MOTION- Quadruped, Neutral Spine   Assume a hands and knees position on a firm surface. Keep your hands under your shoulders and your knees under your hips. You may place padding under your knees for comfort.  Drop  your head and point your tail bone toward the ground below you. This will round out your low back like an angry cat. Hold this position for __________ seconds.  Slowly lift your head and release your tail bone so that your back sags into a large arch, like an old horse.  Hold this position for __________ seconds.  Repeat this until you feel limber in your low back.  Now, find your "sweet spot." This will be the most comfortable position somewhere between the two previous positions. This is your neutral spine. Once you have found this position, tense your stomach muscles to support your low back.  Hold this position for __________ seconds. Repeat __________ times. Complete this exercise __________ times per day.  STRETCH  Flexion, Single Knee to Chest   Lie on a firm bed or floor with both legs extended in front of you.  Keeping one leg in contact with the floor, bring your opposite knee to your chest. Hold your leg in place by either grabbing behind your thigh or at your knee.  Pull until you feel a gentle stretch in your low back. Hold __________ seconds.  Slowly release your grasp and repeat the exercise with the opposite side. Repeat __________ times. Complete this exercise __________ times per day.  STRETCH - Hamstrings, Standing  Stand or sit and extend your right / left leg, placing your foot on a chair or foot stool  Keeping a slight arch in your low back and your hips straight forward.  Lead with your chest and   lean forward at the waist until you feel a gentle stretch in the back of your right / left knee or thigh. (When done correctly, this exercise requires leaning only a small distance.)  Hold this position for __________ seconds. Repeat __________ times. Complete this stretch __________ times per day. STRENGTHENING  Deep Abdominals, Pelvic Tilt   Lie on a firm bed or floor. Keeping your legs in front of you, bend your knees so they are both pointed toward the ceiling and  your feet are flat on the floor.  Tense your lower abdominal muscles to press your low back into the floor. This motion will rotate your pelvis so that your tail bone is scooping upwards rather than pointing at your feet or into the floor.  With a gentle tension and even breathing, hold this position for __________ seconds. Repeat __________ times. Complete this exercise __________ times per day.  STRENGTHENING  Abdominals, Crunches   Lie on a firm bed or floor. Keeping your legs in front of you, bend your knees so they are both pointed toward the ceiling and your feet are flat on the floor. Cross your arms over your chest.  Slightly tip your chin down without bending your neck.  Tense your abdominals and slowly lift your trunk high enough to just clear your shoulder blades. Lifting higher can put excessive stress on the low back and does not further strengthen your abdominal muscles.  Control your return to the starting position. Repeat __________ times. Complete this exercise __________ times per day.  STRENGTHENING  Quadruped, Opposite UE/LE Lift   Assume a hands and knees position on a firm surface. Keep your hands under your shoulders and your knees under your hips. You may place padding under your knees for comfort.  Find your neutral spine and gently tense your abdominal muscles so that you can maintain this position. Your shoulders and hips should form a rectangle that is parallel with the floor and is not twisted.  Keeping your trunk steady, lift your right hand no higher than your shoulder and then your left leg no higher than your hip. Make sure you are not holding your breath. Hold this position __________ seconds.  Continuing to keep your abdominal muscles tense and your back steady, slowly return to your starting position. Repeat with the opposite arm and leg. Repeat __________ times. Complete this exercise __________ times per day. Document Released: 01/25/2005 Document  Revised: 04/01/2011 Document Reviewed: 04/21/2008 [employer] Patient Information 2014 [employer].  

## 2012-10-13 NOTE — Progress Notes (Signed)
Subjective:     Patient ID: [name], female   DOB: 1960-02-11, 52 y.o.   MRN: [id number]  HPI 52 y.o. F presents for reevaluation of abdominal pain. Seen early in august with severe left sided pain. Was evaluated in ER and treated for pain and thought to be MSK and resolved over 2 weeks. Pt reports that overnight pt has been having restarting in left flank coming to center of abdomen.   Review of Systems Pt reports nausea and worse point in left back. Pt reports tearing sensation. No fevers or chills, pt reports lots of gas, belching, GERD. Pt reports normal urination, Pt reports that she is perpetually constipated - last BM 1 week ago. Pt reports that she is taking 2caps/day wihtout recent BM.    Objective:   Physical Exam  Constitutional: She appears well-developed and well-nourished. No distress (mildly uncomfortable).  HENT:  Head: Normocephalic and atraumatic.  Eyes: Conjunctivae and EOM are normal.  Cardiovascular: Normal rate, regular rhythm, normal heart sounds and intact distal pulses.  Exam reveals no gallop and no friction rub.   No murmur heard. Pulmonary/Chest: Effort normal and breath sounds normal. No respiratory distress. She has no wheezes. She has no rales. She exhibits no tenderness.  Abdominal: Soft. She exhibits no distension and no mass. There is tenderness (tender on left quadrant without rebound). There is no rigidity, no rebound, no guarding, no CVA tenderness, no tenderness at McBurney's point and negative Murphy's sign. No hernia.  Skin: She is not diaphoretic.   Filed Vitals:   10/13/12 1022  BP: 163/96  Pulse: 105  Temp: 98.3 F (36.8 C)       Assessment:     52 y.o. F with recurrent pain similar to prior 1 month ago that resolved with pain medication.     Plan:     #abd/Back pain: likely related to constipation vs muscle strain. Encouraged pt to use flexeril and refilled for pt. Also encouraged pt to inc miralax to increase frequency of her bowel  movements. Labs from prior were reassuring without evidence of AAA on non con CT.  Pt actually significantly improved from prior. WIll f/u in 1 month for reeval.  #Insomnia: started pt on trazadone and d/c'd ambien to see if pt will be able to sleep better.  Tawana Scale, MD OB Fellow

## 2012-10-28 DIAGNOSIS — IMO0001 Reserved for inherently not codable concepts without codable children: Secondary | ICD-10-CM | POA: Diagnosis not present

## 2012-10-28 DIAGNOSIS — R5381 Other malaise: Secondary | ICD-10-CM | POA: Diagnosis not present

## 2012-10-28 DIAGNOSIS — M25569 Pain in unspecified knee: Secondary | ICD-10-CM | POA: Diagnosis not present

## 2012-10-28 DIAGNOSIS — M1710 Unilateral primary osteoarthritis, unspecified knee: Secondary | ICD-10-CM | POA: Diagnosis not present

## 2012-10-29 ENCOUNTER — Ambulatory Visit: Payer: BC Managed Care – PPO | Admitting: Family Medicine

## 2012-11-18 ENCOUNTER — Encounter: Payer: Self-pay | Admitting: Family Medicine

## 2012-11-18 ENCOUNTER — Ambulatory Visit (INDEPENDENT_AMBULATORY_CARE_PROVIDER_SITE_OTHER): Payer: BC Managed Care – PPO | Admitting: Family Medicine

## 2012-11-18 VITALS — BP 123/81 | HR 106 | Temp 98.5°F | Ht 63.0 in

## 2012-11-18 DIAGNOSIS — G4700 Insomnia, unspecified: Secondary | ICD-10-CM

## 2012-11-18 DIAGNOSIS — R109 Unspecified abdominal pain: Secondary | ICD-10-CM

## 2012-11-18 LAB — POCT URINALYSIS DIPSTICK
Glucose, UA: NEGATIVE
Ketones, UA: NEGATIVE
Leukocytes, UA: NEGATIVE
Protein, UA: NEGATIVE
Urobilinogen, UA: 0.2

## 2012-11-18 NOTE — Progress Notes (Signed)
Subjective:     Patient ID: [name], female   DOB: 1960/02/06, 52 y.o.   MRN: [id number]  HPI 52 y.o. F here for follow up for insomnia. Pt started on trazdone 50mg  and having significant improvement in sleep. Pt is tolerating without side effects and is able to sleep every night. Pt has also lost 12 lbs intentionally over the last month. Pt states that she is still tired during the day but it seems to be improving from prior. Pt has no complaints today and is here to follow up only on sleep.  Review of Systems Pt denies hair falling out, improved bowel function, pain at baseline and signficiantly improved from prior visit.      Objective:   Physical Exam Filed Vitals:   11/18/12 1650  BP: 123/81  Pulse: 106  Temp: 98.5 F (36.9 C)  NAD discussing condition without issue.  CBC    Component Value Date/Time   WBC 10.7* 08/28/2012 1749   RBC 4.94 08/28/2012 1749   HGB 15.6* 08/28/2012 1749   HCT 45.4 08/28/2012 1749   PLT 319 08/28/2012 1749   MCV 91.9 08/28/2012 1749   MCH 31.6 08/28/2012 1749   MCHC 34.4 08/28/2012 1749   RDW 14.1 08/28/2012 1749   LYMPHSABS 2.0 08/28/2012 1749   MONOABS 0.5 08/28/2012 1749   EOSABS 0.0 08/28/2012 1749   BASOSABS 0.0 08/28/2012 1749    CMP     Component Value Date/Time   NA 136 08/28/2012 1749   K 4.4 08/28/2012 1749   CL 98 08/28/2012 1749   CO2 28 08/28/2012 1749   GLUCOSE 143* 08/28/2012 1749   BUN 16 08/28/2012 1749   CREATININE 1.08 08/28/2012 1749   CREATININE 1.22* 04/10/2012 1649   CALCIUM 9.2 08/28/2012 1749   PROT 7.2 08/28/2012 1749   ALBUMIN 3.7 08/28/2012 1749   AST 18 08/28/2012 1749   ALT 15 08/28/2012 1749   ALKPHOS 122* 08/28/2012 1749   BILITOT 0.2* 08/28/2012 1749   GFRNONAA 58* 08/28/2012 1749   GFRAA 68* 08/28/2012 1749          Assessment:     52 y.o. F w/ MMP here for f/u on insomnia with significant improvment     Plan:     Will continue current regimen without issue. Fatigue is likely related to her multiple comobidities. Pt is starting an  exercise regimen, increasing her sun exposure and taking vitamin D, TSH was normal last check. Will recheck labs in February (~12 months from prior). Pt is very satisfied with improved sleep.

## 2012-11-30 ENCOUNTER — Telehealth: Payer: Self-pay | Admitting: Cardiology

## 2012-11-30 DIAGNOSIS — E782 Mixed hyperlipidemia: Secondary | ICD-10-CM

## 2012-11-30 NOTE — Telephone Encounter (Signed)
Would lab an order sent to the lab before her appt with Dr. Herbie Baltimore on 12/07/12...   Thanks

## 2012-12-01 NOTE — Telephone Encounter (Signed)
Returned call. Left message that labs ordered and to present to lab to have drawn, fasting. Call back before 4pm if questions.  

## 2012-12-03 ENCOUNTER — Telehealth: Payer: Self-pay | Admitting: *Deleted

## 2012-12-03 LAB — LIPID PANEL
Cholesterol: 151 mg/dL (ref 0–200)
Total CHOL/HDL Ratio: 3.8 Ratio

## 2012-12-03 NOTE — Telephone Encounter (Signed)
Lab called and unable to see orders.  Orders released.

## 2012-12-07 ENCOUNTER — Ambulatory Visit: Payer: BC Managed Care – PPO | Admitting: Cardiology

## 2012-12-22 ENCOUNTER — Other Ambulatory Visit: Payer: Self-pay | Admitting: Family Medicine

## 2012-12-29 ENCOUNTER — Ambulatory Visit (INDEPENDENT_AMBULATORY_CARE_PROVIDER_SITE_OTHER): Payer: BC Managed Care – PPO | Admitting: Cardiology

## 2012-12-29 ENCOUNTER — Encounter: Payer: Self-pay | Admitting: Cardiology

## 2012-12-29 VITALS — BP 138/76 | HR 88 | Ht 64.0 in | Wt 214.7 lb

## 2012-12-29 DIAGNOSIS — E7800 Pure hypercholesterolemia, unspecified: Secondary | ICD-10-CM | POA: Diagnosis not present

## 2012-12-29 DIAGNOSIS — R002 Palpitations: Secondary | ICD-10-CM

## 2012-12-29 DIAGNOSIS — R0789 Other chest pain: Secondary | ICD-10-CM | POA: Diagnosis not present

## 2012-12-29 DIAGNOSIS — E669 Obesity, unspecified: Secondary | ICD-10-CM

## 2012-12-29 DIAGNOSIS — I10 Essential (primary) hypertension: Secondary | ICD-10-CM

## 2012-12-29 MED ORDER — METOPROLOL SUCCINATE ER 25 MG PO TB24: 25.0000 mg | ORAL_TABLET | Freq: Every day | ORAL | Status: DC
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
## 2012-12-29 NOTE — Patient Instructions (Addendum)
Increase  Metoprolol 25 mg one tablet a day.   Your physician wants you to follow-up in 6 months Dr Herbie Baltimore. You will receive a reminder letter in the mail two months in advance. If you don't receive a letter, please call our office to schedule the follow-up appointment.

## 2012-12-30 ENCOUNTER — Encounter: Payer: Self-pay | Admitting: Cardiology

## 2012-12-31 ENCOUNTER — Encounter: Payer: Self-pay | Admitting: Cardiology

## 2012-12-31 DIAGNOSIS — E669 Obesity, unspecified: Secondary | ICD-10-CM | POA: Insufficient documentation

## 2012-12-31 NOTE — Assessment & Plan Note (Addendum)
In the past, she had been considering possible bariatric surgery. Also that this is now an ongoing thing. I actually think this would be a very good option for her. The we can try additional dietary modification, but she sees a having a hard time with weight loss and maintaining it. He is trying to adjust her diet and exercise which we discussed in clinic today.

## 2012-12-31 NOTE — Assessment & Plan Note (Signed)
Well-controlled on simply low dose Toprol. She should be able to tolerate the increased dose of 20 mg.

## 2012-12-31 NOTE — Assessment & Plan Note (Signed)
She continues to have this muscle skeletal-type chest pain.  I would defer treatment of her muscle skeletal/costochondritis sounding chest discomfort to Dr. Corliss Skains,  her rheumatologist.  I will forward this note to her.

## 2012-12-31 NOTE — Assessment & Plan Note (Signed)
Excellent control. Continue pravastatin

## 2012-12-31 NOTE — Assessment & Plan Note (Signed)
Her palpitations seem to be better on beta blocker. She is on small dose now. She is 20 blood pressure. Also decreased to 25 mg Toprol.

## 2013-02-22 ENCOUNTER — Telehealth: Payer: Self-pay | Admitting: Family Medicine

## 2013-02-22 NOTE — Telephone Encounter (Signed)
Has a constant problem with ringing in her ears. Says she has discussed this with the dr She wants a referral to ENT Please advise She has an appt Feb 25 w/ [place]

## 2013-02-22 NOTE — Telephone Encounter (Signed)
She will need to be seen prior to a referral being made.

## 2013-02-22 NOTE — Telephone Encounter (Signed)
LMOVM for pt to return call .Fleeger, Jessica Dawn  

## 2013-03-17 ENCOUNTER — Ambulatory Visit: Payer: BC Managed Care – PPO | Admitting: Family Medicine

## 2013-03-31 ENCOUNTER — Other Ambulatory Visit: Payer: Self-pay | Admitting: Family Medicine

## 2013-03-31 NOTE — Telephone Encounter (Signed)
Refills for one month given. Patient needs to be advised to schedule an appointment as she has not been seen since last October. Thanks.

## 2013-04-01 NOTE — Telephone Encounter (Signed)
LMOVM informing patient. Siedah Sedor Dawn  

## 2013-04-08 ENCOUNTER — Encounter: Payer: Self-pay | Admitting: Family Medicine

## 2013-04-08 ENCOUNTER — Ambulatory Visit (INDEPENDENT_AMBULATORY_CARE_PROVIDER_SITE_OTHER): Payer: Medicare Other | Admitting: Family Medicine

## 2013-04-08 VITALS — BP 109/74 | HR 84 | Temp 97.6°F | Wt 219.0 lb

## 2013-04-08 DIAGNOSIS — H9319 Tinnitus, unspecified ear: Secondary | ICD-10-CM

## 2013-04-08 NOTE — Patient Instructions (Signed)

## 2013-04-10 DIAGNOSIS — H9319 Tinnitus, unspecified ear: Secondary | ICD-10-CM | POA: Insufficient documentation

## 2013-04-10 NOTE — Assessment & Plan Note (Signed)
Patient with life long ringing in her ears that over the past several years has been accompanied by dizziness. Concern given this combination of symptoms is for menieres disease vs BPPV vs CNS tumor vs a vestibular neuritis vs structural issue in internal ear. Plan is for patient to see ENT for further work-up. If continues to be an issue may need to consider another MRI of her brain, though will wait on ENT referral before determining this.

## 2013-04-10 NOTE — Progress Notes (Signed)
Patient ID: [name], female   DOB: 06/24/1960, 53 y.o.   MRN: [id number]  Tommi Rumps, MD Phone: [phone]  [name] is a 53 y.o. female who presents today for ringing in her ears.  She notes this has been present her whole life. States it sounds like a bunch of loud crickets. Present all the time. Gets worse throughout the day. She notes dizziness for the past 4-5 years that is present most days. States this feels like the room is spinning. 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 States sometimes the dizziness is so bad it makes her fall out of bed. There is no fullness in her ears. The is no associated nausea or vomiting. She notes her hearing may not be as good as it used to be. She notes she worked in a [employer] previously. She states she may have previously had a stroke as she has had right sided sensation deficits for many years and has seen neuro for this previously. Note she had a brain MRI in 2012 that did not reveal any abnormalities, and note she had all of these symptoms prior to having this MRI. She notes waiting to deal with this issue until her other medical problems were well controlled. Patient notes already having scheduled an appointment to see ENT.  Patient is a former smoker.   ROS: Per HPI   Physical Exam Filed Vitals:   04/08/13 1557  BP: 109/74  Pulse: 84  Temp: 97.6 F (36.4 C)    Physical Examination: General appearance - alert, well appearing, and in no distress Chest - clear to auscultation, no wheezes, rales or rhonchi, symmetric air entry Heart - normal rate, regular rhythm, normal S1, S2, no murmurs, rubs, clicks or gallops Neurological - CN 2-12 intact, 5/5 strength in bilateral deltoids, biceps, triceps, quads, hamstrings, plantar and dorsiflexion, sensation to light touch diminished throughout the right side (states this is her baseline), intact on the left side, reflexes 2+ in brachoradialis, biceps, triceps,  patella, achilles, gait normal, could not perform romberg as patient was unsteady with just her feet together Extremities - no pedal edema noted Skin - normal coloration and turgor, no rashes, no suspicious skin lesions noted HEENT - bilateral TMs normal, dix-halpike without nystagmus  Assessment/Plan: Please see individual problem list.

## 2013-04-19 DIAGNOSIS — H9319 Tinnitus, unspecified ear: Secondary | ICD-10-CM | POA: Diagnosis not present

## 2013-04-19 DIAGNOSIS — H93299 Other abnormal auditory perceptions, unspecified ear: Secondary | ICD-10-CM | POA: Diagnosis not present

## 2013-04-19 DIAGNOSIS — R42 Dizziness and giddiness: Secondary | ICD-10-CM | POA: Diagnosis not present

## 2013-04-24 ENCOUNTER — Other Ambulatory Visit: Payer: Self-pay | Admitting: Family Medicine

## 2013-04-26 NOTE — Telephone Encounter (Signed)
LM for patient to call back.  She already has an appt on 05-11-13 with Dr. Caryl Bis.  Please advise that we can discuss this medication refill at this time. Zadok Holaway,CMA

## 2013-04-26 NOTE — Telephone Encounter (Signed)
Patient does not have gout listed on problem list. She should schedule a follow-up appointment to discuss this medication refill.

## 2013-04-30 MED ORDER — COLCHICINE 0.6 MG PO TABS: 0.6000 mg | ORAL_TABLET | Freq: Every day | ORAL | Status: DC

## 2013-04-30 NOTE — Telephone Encounter (Signed)
Patient returns call, states she has had gout for over 10 years and was diagnosed here at our office by another physician years ago. If she does not take meds she will have a gout flare up. Please call back

## 2013-04-30 NOTE — Telephone Encounter (Signed)
Refill sent in. Problem not on list but it appears Dr Barbra Sarks has previously prescribed this medication. We see at next f/u appointment.

## 2013-04-30 NOTE — Telephone Encounter (Signed)
LM for patient to call back.  Will forward message to MD to see what he says about refilling before appt.  Jazmin Hartsell,CMA

## 2013-05-10 DIAGNOSIS — M5030 Other cervical disc degeneration, unspecified cervical region: Secondary | ICD-10-CM | POA: Diagnosis not present

## 2013-05-10 DIAGNOSIS — IMO0001 Reserved for inherently not codable concepts without codable children: Secondary | ICD-10-CM | POA: Diagnosis not present

## 2013-05-10 DIAGNOSIS — M1710 Unilateral primary osteoarthritis, unspecified knee: Secondary | ICD-10-CM | POA: Diagnosis not present

## 2013-05-10 DIAGNOSIS — M5137 Other intervertebral disc degeneration, lumbosacral region: Secondary | ICD-10-CM | POA: Diagnosis not present

## 2013-05-11 ENCOUNTER — Ambulatory Visit (INDEPENDENT_AMBULATORY_CARE_PROVIDER_SITE_OTHER): Payer: Medicare Other | Admitting: Family Medicine

## 2013-05-11 ENCOUNTER — Encounter: Payer: Self-pay | Admitting: Family Medicine

## 2013-05-11 VITALS — BP 126/87 | HR 103 | Temp 99.1°F | Ht 64.0 in | Wt 215.0 lb

## 2013-05-11 DIAGNOSIS — M2550 Pain in unspecified joint: Secondary | ICD-10-CM | POA: Diagnosis not present

## 2013-05-11 DIAGNOSIS — R209 Unspecified disturbances of skin sensation: Secondary | ICD-10-CM | POA: Diagnosis not present

## 2013-05-11 DIAGNOSIS — R200 Anesthesia of skin: Secondary | ICD-10-CM

## 2013-05-11 DIAGNOSIS — R109 Unspecified abdominal pain: Secondary | ICD-10-CM

## 2013-05-11 DIAGNOSIS — M109 Gout, unspecified: Secondary | ICD-10-CM

## 2013-05-11 DIAGNOSIS — Z79899 Other long term (current) drug therapy: Secondary | ICD-10-CM | POA: Diagnosis not present

## 2013-05-11 DIAGNOSIS — R42 Dizziness and giddiness: Secondary | ICD-10-CM

## 2013-05-11 DIAGNOSIS — Z0389 Encounter for observation for other suspected diseases and conditions ruled out: Secondary | ICD-10-CM | POA: Diagnosis not present

## 2013-05-11 MED ORDER — ALLOPURINOL 100 MG PO TABS: 100.0000 mg | ORAL_TABLET | Freq: Every morning | ORAL | Status: DC

## 2013-05-11 MED ORDER — CYCLOBENZAPRINE HCL 10 MG PO TABS: 5.0000 mg | ORAL_TABLET | Freq: Every day | ORAL | Status: DC

## 2013-05-11 NOTE — Patient Instructions (Signed)
Nice to see you. Please call Dr Ellene Route to set up follow-up about the numbness in your hips and your legs giving out on you. Someone will call you from vestibular rehab to set up an appointment.   Benign Positional Vertigo Vertigo means you feel like you or your surroundings are moving when they are not. Benign positional vertigo is the most common form of vertigo. Benign means that the cause of your condition is not serious. Benign positional vertigo is more common in older adults. CAUSES  Benign positional vertigo is the result of an upset in the labyrinth system. This is an area in the middle ear that helps control your balance. This may be caused by a viral infection, head injury, or repetitive motion. However, often no specific cause is found. SYMPTOMS  Symptoms of benign positional vertigo occur when you move your head or eyes in different directions. Some of the symptoms may include:  Loss of balance and falls.  Vomiting.  Blurred vision.  Dizziness.  Nausea.  Involuntary eye movements (nystagmus). DIAGNOSIS  Benign positional vertigo is usually diagnosed by physical exam. If the specific cause of your benign positional vertigo is unknown, your caregiver may perform imaging tests, such as magnetic resonance imaging (MRI) or computed tomography (CT). TREATMENT  Your caregiver may recommend movements or procedures to correct the benign positional vertigo. Medicines such as meclizine, benzodiazepines, and medicines for nausea may be used to treat your symptoms. In rare cases, if your symptoms are caused by certain conditions that affect the inner ear, you may need surgery. HOME CARE INSTRUCTIONS   Follow your caregiver's instructions.  Move slowly. Do not make sudden body or head movements.  Avoid driving.  Avoid operating heavy machinery.  Avoid performing any tasks that would be dangerous to you or others during a vertigo episode.  Drink enough fluids to keep your urine  clear or pale yellow. SEEK IMMEDIATE MEDICAL CARE IF:   You develop problems with walking, weakness, numbness, or using your arms, hands, or legs.  You have difficulty speaking.  You develop severe headaches.  Your nausea or vomiting continues or gets worse.  You develop visual changes.  Your family or friends notice any behavioral changes.  Your condition gets worse.  You have a fever.  You develop a stiff neck or sensitivity to light. MAKE SURE YOU:   Understand these instructions.  Will watch your condition.  Will get help right away if you are not doing well or get worse. Document Released: 10/15/2005 Document Revised: 04/01/2011 Document Reviewed: 09/27/2010 [facility] Patient Information 2014 [place].

## 2013-05-12 ENCOUNTER — Other Ambulatory Visit: Payer: Self-pay | Admitting: *Deleted

## 2013-05-12 DIAGNOSIS — R200 Anesthesia of skin: Secondary | ICD-10-CM | POA: Insufficient documentation

## 2013-05-12 DIAGNOSIS — R42 Dizziness and giddiness: Secondary | ICD-10-CM | POA: Insufficient documentation

## 2013-05-12 DIAGNOSIS — M109 Gout, unspecified: Secondary | ICD-10-CM | POA: Insufficient documentation

## 2013-05-12 NOTE — Assessment & Plan Note (Signed)
Patient with reported bilateral hip numbness. She has previously had back surgery by Dr Ellene Route. Her current neuro exam is at its baseline. Will refer her back to Dr Ellene Route for evaluation of this issue.

## 2013-05-12 NOTE — Assessment & Plan Note (Signed)
Allopurinol refilled

## 2013-05-12 NOTE — Assessment & Plan Note (Addendum)
Patient with continued vertigo. Has seen ENT and they she reports they told they could not do anything for this issue. 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 If not improved, may need to be evaluated by neurology. Will request records from [facility].

## 2013-05-12 NOTE — Telephone Encounter (Signed)
Called in rx for flexeril to pharmacy.  Rx was printed at office visit and patient left without getting it.  Jazmin Hartsell,CMA

## 2013-05-12 NOTE — Progress Notes (Signed)
Patient ID: [name], female   DOB: [birth year], 53 y.o.   MRN: [id number]  Tommi Rumps, MD Phone: [phone]  [name] is a 53 y.o. female who presents today for f/u.  Dizziness: patient notes since seen last time she has been to ENT. They stated the tinnitus issue was related to the area the 8th CN comes through the skull. She reports they told her there was nothing to do for this. They told her her dizziness is not something they have an answer for. Dizziness is still present, sometimes it is so bad that it throws her out of bed. Worse when she goes from sitting to laying down. Feels like the room is spinning. Recurs if she changes positions. There is no fullness in her ears. The is no associated nausea or vomiting. She notes her hearing may not be as good as it used to be. She notes she worked in a [employer] previously. She states she may have previously had a stroke as she has had right sided sensation deficits for many years and has seen neuro for this previously. Note she had a brain MRI in 2012 that did not reveal any abnormalities, and note she had all of these symptoms prior to having this MRI.   Patient additionally notes sensation of bilateral hip numbness. No noted back pain with this. This depends on the position she is in. She notes she still has baseline numbness on the right side of her body.  Patient requesting refill on allopurinol for her gout.  Patient is a former smoker.   ROS: Per HPI   Physical Exam Filed Vitals:   05/11/13 1525  BP: 126/87  Pulse: 103  Temp: 99.1 F (37.3 C)    Physical Examination: General appearance - alert, well appearing, and in no distress Eyes - pupils equal and reactive, extraocular eye movements intact Mouth - mucous membranes moist, pharynx normal without lesions Chest - clear to auscultation, no wheezes, rales or rhonchi, symmetric air entry Heart - normal rate, regular rhythm, normal S1, S2, no murmurs, rubs, clicks  or gallops Neurological - CN 2-12 intact, 5/5 strength in bilateral biceps, triceps, grip, hip flexors, quads, hamstrings, plantar and dorsiflexion, sensation to light touch diminished throughout the right side (states this is her baseline), intact on the left side, gait normal, positive romberg Skin - normal coloration and turgor, no rashes, no suspicious skin lesions noted Dix hallpike with dizziness, no nystagmus  Assessment/Plan: Please see individual problem list.

## 2013-05-19 ENCOUNTER — Telehealth: Payer: Self-pay | Admitting: Oncology

## 2013-05-21 ENCOUNTER — Telehealth: Payer: Self-pay | Admitting: Family Medicine

## 2013-05-21 DIAGNOSIS — M069 Rheumatoid arthritis, unspecified: Secondary | ICD-10-CM

## 2013-05-21 NOTE — Telephone Encounter (Signed)
Referral placed.

## 2013-05-21 NOTE — Telephone Encounter (Signed)
[name] is requesting per Dr. Anette Guarneri that provider put in referral for patient to attend [facility].  Please let her know when this has been completed and when is her appt.  Need asap so she doesn't have a lapse in pain regimen.

## 2013-05-21 NOTE — Telephone Encounter (Signed)
Spoke with patient and made her aware of referral process for pain clinic.  Informed her that we would send that records that we had and if she wants records from other offices sent she needs to get them to [place].  She is aware of this and will go Monday to sign a release with their offices to send them to [place].  I also informed her that we would have to wait for pain clinic to review her records and then they will let us and her know about an appt is they take on her case.  She is aware of this. Jazmin Hartsell,CMA

## 2013-05-24 ENCOUNTER — Other Ambulatory Visit: Payer: Self-pay | Admitting: Family Medicine

## 2013-05-31 ENCOUNTER — Telehealth: Payer: Self-pay | Admitting: Family Medicine

## 2013-05-31 NOTE — Telephone Encounter (Signed)
error 

## 2013-05-31 NOTE — Telephone Encounter (Signed)
Patient calls requesting pain meds to last her until her initial appt with Pain Clinic. She is about to be completely out of her pain meds and her appt with [facility] is not until 6/2 @ 2:30 pm. Also, would like Dr. Caryl Bis to know she has an appt with her neurosurgeon on 7/2 @ 2 pm. Please call patient.

## 2013-05-31 NOTE — Telephone Encounter (Signed)
Will forward to MD to address refilling pain medication. Aubrynn Kat[place],CMA

## 2013-06-02 NOTE — Telephone Encounter (Signed)
It does not appear that we have ever filled her pain medication through our office, at least per review of her medication records. Could you confirm with the patient what pain medication she is on and where she has had this filled previously. Then I will decide if it is reasonable for [place] to refill this medication for the patient and if she will need to come in for a visit to have this refilled. Thanks.

## 2013-06-02 NOTE — Telephone Encounter (Signed)
Pt states that she takes oxycotin and was getting from Dr. Estil Daft.  She would like to know if she can get anything for pain to last til her appt.  She is willing to make another appt to see if you needed.  Please advise. Jazmin Hartsell,CMA

## 2013-06-03 NOTE — Telephone Encounter (Signed)
Spoke with patient and she will be out of medication tomorrow.  You have not been doubled booked yet for the morning.  She is aware that if she were to worked in in the morning that pain is the only thing that can be discussed.  She can not get her meds from previous MD since she has already handed her over to pain clinic.  They informed her that she would have to come here in the meantime.  Please advise if patient can come in tomorrow between 9-10am.  Thanks Amorie Rentz,CMA

## 2013-06-03 NOTE — Telephone Encounter (Signed)
She should be advised to make a follow-up appointment to have this filled. Thanks.

## 2013-06-04 ENCOUNTER — Ambulatory Visit (INDEPENDENT_AMBULATORY_CARE_PROVIDER_SITE_OTHER): Payer: Medicare Other | Admitting: Family Medicine

## 2013-06-04 ENCOUNTER — Encounter: Payer: Self-pay | Admitting: Family Medicine

## 2013-06-04 VITALS — BP 117/74 | HR 102 | Temp 98.3°F | Wt 222.0 lb

## 2013-06-04 DIAGNOSIS — M459 Ankylosing spondylitis of unspecified sites in spine: Secondary | ICD-10-CM

## 2013-06-04 MED ORDER — OXYCODONE HCL ER 20 MG PO T12A: 20.0000 mg | EXTENDED_RELEASE_TABLET | Freq: Two times a day (BID) | ORAL | Status: DC

## 2013-06-04 NOTE — Assessment & Plan Note (Signed)
Patient presents for pain medication refill. Is not able to get pain medications refilled by Dr Estanislado Pandy as has been referred to pain management clinic. Will give refill to cover until the patient gets to pain management clinic appointment. Oxycontin #40 Rx given. Patient already has follow-up scheduled with me in one month.

## 2013-06-04 NOTE — Progress Notes (Signed)
Patient ID: MYRELLA FAHS, female   DOB: [birth year], 53 y.o.   MRN: [id number]  Tommi Rumps, MD Phone: [phone]  NOHEMY KOOP is a 53 y.o. female who presents today for f/u.  Patient comes in today for refill on pain medication. Patient previously followed by Dr Estanislado Pandy for her pain relating to he ankylosing spondylitis, DDD, and fibromyalgia. She has since been referred to pain management clinic for future pain medications. She is now out of pain medication. The patient reports that Dr Arlean Hopping office stated she needed to see her PCP for refills until she got in to see pain management on 6/2. She takes oxycontin 20 mg BID. She has been on this for many years. She states this controls her pain, but does not get rid of it.  Patient is a former smoker.   ROS: Per HPI   Physical Exam Filed Vitals:   06/04/13 1018  BP: 117/74  Pulse: 102  Temp: 98.3 F (36.8 C)    Gen: Well NAD HEENT: NCAT Psych: normal affect  Assessment/Plan: Please see individual problem list.

## 2013-06-04 NOTE — Patient Instructions (Signed)
Nice to see you. I will refill your oxycodone to get you to your pain management appointment. Please keep that appointment.  I will see you back in one month.

## 2013-06-22 ENCOUNTER — Other Ambulatory Visit: Payer: Self-pay | Admitting: Family Medicine

## 2013-06-22 ENCOUNTER — Encounter: Payer: Self-pay | Admitting: Family Medicine

## 2013-06-22 DIAGNOSIS — M961 Postlaminectomy syndrome, not elsewhere classified: Secondary | ICD-10-CM | POA: Diagnosis not present

## 2013-06-22 DIAGNOSIS — G894 Chronic pain syndrome: Secondary | ICD-10-CM | POA: Diagnosis not present

## 2013-06-22 DIAGNOSIS — M5030 Other cervical disc degeneration, unspecified cervical region: Secondary | ICD-10-CM | POA: Diagnosis not present

## 2013-06-22 DIAGNOSIS — IMO0001 Reserved for inherently not codable concepts without codable children: Secondary | ICD-10-CM | POA: Diagnosis not present

## 2013-06-22 NOTE — Progress Notes (Signed)
Doctor at pain clinic wants you to refill prescription for Oxycotin for 30 days for June.  He will continue it after that.  Place paper from pain clinic in dr's box.  Please call when ready for her to pick up. She will need this before Thursday afternoon.

## 2013-06-23 ENCOUNTER — Other Ambulatory Visit: Payer: Self-pay | Admitting: Family Medicine

## 2013-06-23 MED ORDER — OXYCODONE HCL ER 20 MG PO T12A: 20.0000 mg | EXTENDED_RELEASE_TABLET | Freq: Two times a day (BID) | ORAL | Status: DC

## 2013-06-23 NOTE — Progress Notes (Signed)
Patient ID: [name], female   DOB: Feb 12, 1960, 53 y.o.   MRN: [id number] Prescription is waiting at the front for the patient.

## 2013-06-24 NOTE — Progress Notes (Signed)
[facility] informing pt that "the rx you requested is ready for pickup". Laban Emperor Fleeger

## 2013-07-06 ENCOUNTER — Ambulatory Visit: Payer: No Typology Code available for payment source | Admitting: Family Medicine

## 2013-07-14 ENCOUNTER — Ambulatory Visit: Payer: No Typology Code available for payment source | Admitting: Cardiology

## 2013-07-22 ENCOUNTER — Ambulatory Visit (INDEPENDENT_AMBULATORY_CARE_PROVIDER_SITE_OTHER): Payer: Medicare Other | Admitting: Family Medicine

## 2013-07-22 VITALS — BP 118/79 | HR 92 | Temp 98.0°F | Resp 18 | Wt 217.0 lb

## 2013-07-22 DIAGNOSIS — M791 Myalgia, unspecified site: Secondary | ICD-10-CM | POA: Insufficient documentation

## 2013-07-22 DIAGNOSIS — IMO0001 Reserved for inherently not codable concepts without codable children: Secondary | ICD-10-CM

## 2013-07-22 NOTE — Patient Instructions (Signed)
Please stop the pravastatin. This may be leading to your pain. We will check some labs for a cause of this issue. Please come back next week to have these labs drawn.

## 2013-07-22 NOTE — Assessment & Plan Note (Signed)
Patient with chronic history of myalgias, though has not brought this to providers attention previously. Could potentially be related to statin use as this makes her pain worse. Patient also with other rheumatologic disorders so could be related to a rheumatologic issue. Will have the patient stop the pravastatin at this time. Given the chronicity of this issue and the fact that it does not bother her if she does not increase her activity level I do not think the patient is suffering from rhabdo, so will get labs (CK, CMET, CBC, TSH) next Monday to evaluate for causes.

## 2013-07-22 NOTE — Progress Notes (Signed)
Patient ID: [name], female   DOB: 1960/08/25, 53 y.o.   MRN: [id number]  Tommi Rumps, MD Phone: [phone]  [name] is a 53 y.o. female who presents today for f/u.  Myalgias: patient complains of myalgias for the first time following a reported several year history of this pain. Notes all her muscles ache. This can occur with doing even the smallest of activities such as "doing my hair." she notes that she will feel exhausted in her muscles like after working out for several days when this occurs. She notes this pain is worse with taking her statin. She notes on days that she hurts she does not take the statin and her pain is not as pain. She has not had any muscle weakness related to this. She has not discussed this with her rheumatologist. Her next rheumatology appointment is in October. She has these pains despite taking oxycodone.   ROS: Per HPI   Physical Exam Filed Vitals:   07/22/13 1655  BP: 118/79  Pulse: 92  Temp: 98 F (36.7 C)  Resp: 18    Gen: Well NAD Lungs: CTABL Nl WOB Heart: RRR no MRG MSK: tenderness to palpation of the quads, hamstrings, calfs, biceps, triceps, deltoids, no apparent erythema or swelling of these muscles Neuro: strength 5/5 in biceps, triceps, grip, quads, and hamstrings Exts: Non edematous BL  LE, warm and well perfused.    Assessment/Plan: Please see individual problem list.  # Healthcare maintenance: not addressed

## 2013-07-26 ENCOUNTER — Other Ambulatory Visit: Payer: No Typology Code available for payment source

## 2013-07-27 ENCOUNTER — Other Ambulatory Visit: Payer: Self-pay | Admitting: Family Medicine

## 2013-07-29 ENCOUNTER — Other Ambulatory Visit: Payer: No Typology Code available for payment source

## 2013-08-02 DIAGNOSIS — D485 Neoplasm of uncertain behavior of skin: Secondary | ICD-10-CM | POA: Diagnosis not present

## 2013-08-02 DIAGNOSIS — L820 Inflamed seborrheic keratosis: Secondary | ICD-10-CM | POA: Diagnosis not present

## 2013-08-02 DIAGNOSIS — D235 Other benign neoplasm of skin of trunk: Secondary | ICD-10-CM | POA: Diagnosis not present

## 2013-08-06 ENCOUNTER — Other Ambulatory Visit: Payer: Self-pay | Admitting: Pain Medicine

## 2013-08-06 DIAGNOSIS — M542 Cervicalgia: Secondary | ICD-10-CM

## 2013-08-06 DIAGNOSIS — M545 Low back pain, unspecified: Secondary | ICD-10-CM

## 2013-08-14 ENCOUNTER — Ambulatory Visit
Admission: RE | Admit: 2013-08-14 | Discharge: 2013-08-14 | Disposition: A | Discharge status: Home / Self Care | Payer: No Typology Code available for payment source | Source: Ambulatory Visit | Attending: Pain Medicine | Admitting: Pain Medicine

## 2013-08-14 DIAGNOSIS — M545 Low back pain, unspecified: Secondary | ICD-10-CM

## 2013-08-14 DIAGNOSIS — M542 Cervicalgia: Secondary | ICD-10-CM

## 2013-08-16 ENCOUNTER — Ambulatory Visit
Admission: RE | Admit: 2013-08-16 | Discharge: 2013-08-16 | Disposition: A | Discharge status: Home / Self Care | Payer: No Typology Code available for payment source | Source: Ambulatory Visit | Attending: Pain Medicine | Admitting: Pain Medicine

## 2013-08-16 DIAGNOSIS — M5020 Other cervical disc displacement, unspecified cervical region: Secondary | ICD-10-CM | POA: Diagnosis not present

## 2013-08-16 DIAGNOSIS — M47812 Spondylosis without myelopathy or radiculopathy, cervical region: Secondary | ICD-10-CM | POA: Diagnosis not present

## 2013-08-16 DIAGNOSIS — M5126 Other intervertebral disc displacement, lumbar region: Secondary | ICD-10-CM | POA: Diagnosis not present

## 2013-08-16 DIAGNOSIS — M47817 Spondylosis without myelopathy or radiculopathy, lumbosacral region: Secondary | ICD-10-CM | POA: Diagnosis not present

## 2013-08-17 DIAGNOSIS — M5137 Other intervertebral disc degeneration, lumbosacral region: Secondary | ICD-10-CM | POA: Diagnosis not present

## 2013-08-18 ENCOUNTER — Other Ambulatory Visit: Payer: Self-pay | Admitting: Family Medicine

## 2013-08-26 DIAGNOSIS — Z6837 Body mass index (BMI) 37.0-37.9, adult: Secondary | ICD-10-CM | POA: Diagnosis not present

## 2013-08-26 DIAGNOSIS — M479 Spondylosis, unspecified: Secondary | ICD-10-CM | POA: Diagnosis not present

## 2013-08-27 ENCOUNTER — Encounter: Payer: Self-pay | Admitting: Family Medicine

## 2013-08-27 ENCOUNTER — Other Ambulatory Visit: Payer: Medicare Other

## 2013-08-27 DIAGNOSIS — M791 Myalgia, unspecified site: Secondary | ICD-10-CM

## 2013-08-27 LAB — CBC
HCT: 46.7 % — ABNORMAL HIGH (ref 36.0–46.0)
Hemoglobin: 16.2 g/dL — ABNORMAL HIGH (ref 12.0–15.0)
MCH: 30.7 pg (ref 26.0–34.0)
MCHC: 34.7 g/dL (ref 30.0–36.0)
MCV: 88.6 fL (ref 78.0–100.0)
PLATELETS: 321 10*3/uL (ref 150–400)
RBC: 5.27 MIL/uL — ABNORMAL HIGH (ref 3.87–5.11)
RDW: 14.7 % (ref 11.5–15.5)
WBC: 10.2 10*3/uL (ref 4.0–10.5)

## 2013-08-27 NOTE — Progress Notes (Signed)
Pt dropped off form to be filled out regarding parking sticker.

## 2013-08-27 NOTE — Progress Notes (Signed)
Placed in MD box. Fleeger, Salome Spotted

## 2013-08-28 LAB — COMPREHENSIVE METABOLIC PANEL
ALBUMIN: 4.2 g/dL (ref 3.5–5.2)
ALK PHOS: 104 U/L (ref 39–117)
ALT: 20 U/L (ref 0–35)
AST: 13 U/L (ref 0–37)
BUN: 27 mg/dL — ABNORMAL HIGH (ref 6–23)
CO2: 32 mEq/L (ref 19–32)
Calcium: 9.4 mg/dL (ref 8.4–10.5)
Chloride: 101 mEq/L (ref 96–112)
Creat: 1.55 mg/dL — ABNORMAL HIGH (ref 0.50–1.10)
Glucose, Bld: 88 mg/dL (ref 70–99)
POTASSIUM: 4.8 meq/L (ref 3.5–5.3)
SODIUM: 140 meq/L (ref 135–145)
TOTAL PROTEIN: 6.5 g/dL (ref 6.0–8.3)
Total Bilirubin: 0.5 mg/dL (ref 0.2–1.2)

## 2013-08-28 LAB — CK: Total CK: 107 U/L (ref 7–177)

## 2013-08-28 LAB — TSH: TSH: 3.648 u[IU]/mL (ref 0.350–4.500)

## 2013-08-29 ENCOUNTER — Telehealth: Payer: Self-pay | Admitting: Family Medicine

## 2013-08-29 DIAGNOSIS — N183 Chronic kidney disease, stage 3 (moderate): Secondary | ICD-10-CM

## 2013-08-29 DIAGNOSIS — D582 Other hemoglobinopathies: Secondary | ICD-10-CM

## 2013-08-29 NOTE — Telephone Encounter (Signed)
I attempted to call the patient to discuss the results of her recent labs though there was no answer at her home number. Note patient came in to have these drawn one month after she was requested to come in. Her labs did not reveal an apparent cause for her muscle aches. She does not have rhabdomyolysis based on her normal CK. Her liver function is normal as well as her thyroid function. Of note her kidney function is worse than previously. When she calls back please advise her to minimize the use of NSAIDs. I would like for her to come back in to the office to have her labs redrawn about one weeks after these labs to see if there is a trend to her kidney function. Please contact the patient regarding this. Thanks.

## 2013-08-30 NOTE — Telephone Encounter (Signed)
Called and spoke with patient and informed of the below.  Pt states that she does not take ibuprofen.  States that "this happened one time before when I went into a coma 3 years ago".  Attempted to get more info and clarification but pt is not a good historian (jumps from place to place).  Advised that I though she should be seen by MD instead of just lab draws.  Pt is agreeable but only wants to see Dr. Caryl Bis.  Appt made for Monday 09/06/13 (one week from today) but advised to give [place] a call if she felt worse in anyway, also to increase H2O intake.  Pt agreeable to both. Erik Nessel, Salome Spotted

## 2013-08-31 ENCOUNTER — Other Ambulatory Visit: Payer: Self-pay | Admitting: Family Medicine

## 2013-09-06 ENCOUNTER — Ambulatory Visit (INDEPENDENT_AMBULATORY_CARE_PROVIDER_SITE_OTHER): Payer: No Typology Code available for payment source | Admitting: Family Medicine

## 2013-09-06 ENCOUNTER — Encounter: Payer: Self-pay | Admitting: Family Medicine

## 2013-09-06 VITALS — BP 137/90 | HR 94 | Temp 97.8°F | Wt 215.0 lb

## 2013-09-06 DIAGNOSIS — IMO0001 Reserved for inherently not codable concepts without codable children: Secondary | ICD-10-CM | POA: Diagnosis not present

## 2013-09-06 DIAGNOSIS — I10 Essential (primary) hypertension: Secondary | ICD-10-CM

## 2013-09-06 DIAGNOSIS — M1A00X0 Idiopathic chronic gout, unspecified site, without tophus (tophi): Secondary | ICD-10-CM

## 2013-09-06 DIAGNOSIS — M1A0720 Idiopathic chronic gout, left ankle and foot, without tophus (tophi): Secondary | ICD-10-CM

## 2013-09-06 DIAGNOSIS — N183 Chronic kidney disease, stage 3 unspecified: Secondary | ICD-10-CM

## 2013-09-06 DIAGNOSIS — M1A9XX0 Chronic gout, unspecified, without tophus (tophi): Secondary | ICD-10-CM

## 2013-09-06 DIAGNOSIS — M791 Myalgia, unspecified site: Secondary | ICD-10-CM

## 2013-09-06 LAB — CBC
HCT: 47.3 % — ABNORMAL HIGH (ref 36.0–46.0)
HEMOGLOBIN: 16.4 g/dL — AB (ref 12.0–15.0)
MCH: 30.8 pg (ref 26.0–34.0)
MCHC: 34.7 g/dL (ref 30.0–36.0)
MCV: 88.7 fL (ref 78.0–100.0)
PLATELETS: 331 10*3/uL (ref 150–400)
RBC: 5.33 MIL/uL — AB (ref 3.87–5.11)
RDW: 15.1 % (ref 11.5–15.5)
WBC: 8.4 10*3/uL (ref 4.0–10.5)

## 2013-09-06 NOTE — Patient Instructions (Signed)
Nice to see you. We will call you with the lab results. You should stay well hydrated. Please avoid ibuprofen and aleve.

## 2013-09-06 NOTE — Progress Notes (Signed)
Patient ID: [name], female   DOB: [birth year], 53 y.o.   MRN: [id number]  Tommi Rumps, MD Phone: [phone]  [name] is a 53 y.o. female who presents today for f/u.  CKD: patient presents in follow-up for increased creatinine on last lab draw. 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 Notes she had HTN in the past, though not at this time. She denies a history of DM. She does not use NSAIDs. She tries to drink 64oz of fluids daily.  Gout: patient notes this is stable. She has not had an attack in several years. She is taking allopurinol for this. Colchicine if she needs it. She notes no complaints regarding these medications.  Myalgias: she notes that these are mildly improved. She has noticed a small difference since coming off of the statin. She does describe feeling worn out in her muscles. She notes she is followed by a pain specialist for her fibromyalgia and that the medications she takes for pain have been helpful for her myalgias. On reviewing her labs there are no obvious signs or causes of muscle dysfunction at this time.  Patient is a smoker.   ROS: Per HPI   Physical Exam Filed Vitals:   09/06/13 0910  BP: 137/90  Pulse: 94  Temp: 97.8 F (36.6 C)    Gen: Well NAD HEENT: PERRL,  MMM Lungs: CTABL Nl WOB Heart: RRR no MRG MSK: there is no tenderness to palpation of bilateral quads, calves, or biceps Exts: Non edematous BL  LE, warm and well perfused.    Assessment/Plan: Please see individual problem list.  # Healthcare maintenance: needs visit for pap smear  Tommi Rumps, MD [facility] PGY-3

## 2013-09-06 NOTE — Progress Notes (Signed)
Patient ID: [name], female   DOB: 1960/03/15, 53 y.o.   MRN: [id number] Completed and given to patient at visit today.

## 2013-09-07 LAB — BASIC METABOLIC PANEL
BUN: 16 mg/dL (ref 6–23)
CALCIUM: 9.7 mg/dL (ref 8.4–10.5)
CO2: 28 meq/L (ref 19–32)
CREATININE: 1.21 mg/dL — AB (ref 0.50–1.10)
Chloride: 100 mEq/L (ref 96–112)
Glucose, Bld: 107 mg/dL — ABNORMAL HIGH (ref 70–99)
Potassium: 4.4 mEq/L (ref 3.5–5.3)
Sodium: 138 mEq/L (ref 135–145)

## 2013-09-07 LAB — LDL CHOLESTEROL, DIRECT: LDL DIRECT: 174 mg/dL — AB

## 2013-09-07 NOTE — Assessment & Plan Note (Signed)
Stable at this time. No recurrences. Will continue allopurinol and colchicine. F/u 1 year or sooner if needed.

## 2013-09-07 NOTE — Assessment & Plan Note (Signed)
Mildly improved from previously. Labs were not significant to a cause for this. Suspect partially related to statin use vs part of her rheumatologic condition. She is to continue her current pain medications. We will check an LDL to determine the patients need for therapy for cholesterol now that she is off of a statin.

## 2013-09-07 NOTE — Assessment & Plan Note (Addendum)
Patient noted to have worsened Cr on recent lab draw. 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 Discussed risk factors for renal disease and discussed avoidance of NSAIDs at this time. It would be prudent to confirm this increase in her Cr so will recheck a BMET at this time. Per review of her medications and uptodate, she is not on any medication that could worsen her renal function. Will continue to monitor and consider referral to renal if still worsening. F/u in 3 months.

## 2013-09-10 ENCOUNTER — Telehealth: Payer: Self-pay | Admitting: Family Medicine

## 2013-09-10 ENCOUNTER — Other Ambulatory Visit: Payer: Self-pay | Admitting: Family Medicine

## 2013-09-10 DIAGNOSIS — E559 Vitamin D deficiency, unspecified: Secondary | ICD-10-CM

## 2013-09-10 MED ORDER — ATORVASTATIN CALCIUM 40 MG PO TABS: 40.0000 mg | ORAL_TABLET | Freq: Every day | ORAL | Status: DC

## 2013-09-10 NOTE — Telephone Encounter (Signed)
appt made and pt informed. Fleeger, Salome [name]

## 2013-09-10 NOTE — Telephone Encounter (Signed)
Called patient to discuss lab results. Informed of improved creatinine and elevated cholesterol. Patient states was previously on lipitor with no ill effects, so will give trial of this medication. She also mentions she just finished vitamin D supplementation. We will have her come in for a lab appointment for this to be collected to ensure it has improved.

## 2013-09-13 ENCOUNTER — Other Ambulatory Visit: Payer: Medicare Other

## 2013-09-16 DIAGNOSIS — M5137 Other intervertebral disc degeneration, lumbosacral region: Secondary | ICD-10-CM | POA: Diagnosis not present

## 2013-09-28 ENCOUNTER — Ambulatory Visit: Payer: No Typology Code available for payment source | Admitting: Cardiology

## 2013-09-29 ENCOUNTER — Other Ambulatory Visit: Payer: Self-pay | Admitting: Family Medicine

## 2013-09-30 NOTE — Telephone Encounter (Signed)
Refill given on colchicine.

## 2013-10-20 ENCOUNTER — Encounter: Payer: Self-pay | Admitting: Cardiology

## 2013-10-21 DIAGNOSIS — M5137 Other intervertebral disc degeneration, lumbosacral region: Secondary | ICD-10-CM | POA: Diagnosis not present

## 2013-10-29 ENCOUNTER — Ambulatory Visit: Payer: No Typology Code available for payment source | Admitting: Family Medicine

## 2013-11-01 ENCOUNTER — Ambulatory Visit: Payer: No Typology Code available for payment source | Admitting: Family Medicine

## 2013-11-02 ENCOUNTER — Other Ambulatory Visit: Payer: Self-pay | Admitting: Family Medicine

## 2013-11-03 ENCOUNTER — Emergency Department (HOSPITAL_COMMUNITY)
Admission: EM | Admit: 2013-11-03 | Discharge: 2013-11-03 | Disposition: A | Discharge status: Home / Self Care | Payer: No Typology Code available for payment source | Attending: Emergency Medicine | Admitting: Emergency Medicine

## 2013-11-03 ENCOUNTER — Emergency Department (HOSPITAL_COMMUNITY): Payer: No Typology Code available for payment source

## 2013-11-03 ENCOUNTER — Encounter (HOSPITAL_COMMUNITY): Payer: Self-pay | Admitting: Emergency Medicine

## 2013-11-03 DIAGNOSIS — M1990 Unspecified osteoarthritis, unspecified site: Secondary | ICD-10-CM | POA: Insufficient documentation

## 2013-11-03 DIAGNOSIS — G8929 Other chronic pain: Secondary | ICD-10-CM | POA: Insufficient documentation

## 2013-11-03 DIAGNOSIS — Z96651 Presence of right artificial knee joint: Secondary | ICD-10-CM | POA: Insufficient documentation

## 2013-11-03 DIAGNOSIS — E039 Hypothyroidism, unspecified: Secondary | ICD-10-CM | POA: Insufficient documentation

## 2013-11-03 DIAGNOSIS — I10 Essential (primary) hypertension: Secondary | ICD-10-CM | POA: Diagnosis not present

## 2013-11-03 DIAGNOSIS — Z87891 Personal history of nicotine dependence: Secondary | ICD-10-CM | POA: Insufficient documentation

## 2013-11-03 DIAGNOSIS — Z7982 Long term (current) use of aspirin: Secondary | ICD-10-CM | POA: Insufficient documentation

## 2013-11-03 DIAGNOSIS — K219 Gastro-esophageal reflux disease without esophagitis: Secondary | ICD-10-CM | POA: Insufficient documentation

## 2013-11-03 DIAGNOSIS — M25561 Pain in right knee: Secondary | ICD-10-CM | POA: Diagnosis not present

## 2013-11-03 DIAGNOSIS — Z91040 Latex allergy status: Secondary | ICD-10-CM | POA: Insufficient documentation

## 2013-11-03 DIAGNOSIS — E669 Obesity, unspecified: Secondary | ICD-10-CM | POA: Diagnosis not present

## 2013-11-03 DIAGNOSIS — Z79899 Other long term (current) drug therapy: Secondary | ICD-10-CM | POA: Diagnosis not present

## 2013-11-03 DIAGNOSIS — N183 Chronic kidney disease, stage 3 (moderate): Secondary | ICD-10-CM | POA: Insufficient documentation

## 2013-11-03 DIAGNOSIS — F419 Anxiety disorder, unspecified: Secondary | ICD-10-CM | POA: Diagnosis not present

## 2013-11-03 DIAGNOSIS — I129 Hypertensive chronic kidney disease with stage 1 through stage 4 chronic kidney disease, or unspecified chronic kidney disease: Secondary | ICD-10-CM | POA: Insufficient documentation

## 2013-11-03 DIAGNOSIS — Z96652 Presence of left artificial knee joint: Secondary | ICD-10-CM | POA: Diagnosis not present

## 2013-11-03 DIAGNOSIS — M25569 Pain in unspecified knee: Secondary | ICD-10-CM

## 2013-11-03 DIAGNOSIS — F329 Major depressive disorder, single episode, unspecified: Secondary | ICD-10-CM | POA: Insufficient documentation

## 2013-11-03 MED ORDER — SODIUM CHLORIDE 0.9 % IV BOLUS (SEPSIS): 500.0000 mL | Freq: Once | INTRAVENOUS | Status: DC

## 2013-11-03 MED ORDER — HYDROMORPHONE HCL 1 MG/ML IJ SOLN
1.0000 mg | Freq: Once | INTRAMUSCULAR | Status: DC
  Filled 2013-11-03: qty 1

## 2013-11-03 MED ORDER — COLCHICINE 0.6 MG PO TABS: 0.6000 mg | ORAL_TABLET | Freq: Once | ORAL | Status: DC

## 2013-11-03 MED ORDER — COLCHICINE 0.6 MG PO TABS
0.6000 mg | ORAL_TABLET | Freq: Once | ORAL | Status: AC
  Administered 2013-11-03: 0.6 mg via ORAL
  Filled 2013-11-03: qty 1

## 2013-11-03 MED ORDER — HYDROMORPHONE HCL 1 MG/ML IJ SOLN
1.0000 mg | Freq: Once | INTRAMUSCULAR | Status: AC
  Administered 2013-11-03: 1 mg via INTRAMUSCULAR

## 2013-11-03 NOTE — ED Provider Notes (Signed)
CSN: [id number]     Arrival date & time 11/03/13  5974 History   First MD Initiated Contact with Patient 11/03/13 [phone]     Chief Complaint  Patient presents with  . Knee Pain     (Consider location/radiation/quality/duration/timing/severity/associated sxs/prior Treatment) Patient is a 53 y.o. female presenting with knee pain. The history is provided by the patient.  Knee Pain Location:  Knee Injury: no   Knee location:  R knee Pain details:    Quality:  Aching and dull   Radiates to:  Groin (radiates into hip and ankle)   Severity:  Moderate   Onset quality:  Sudden   Duration:  2 days   Timing:  Constant   Progression:  Worsening Chronicity:  Chronic Dislocation: no   Prior injury to area:  Yes Relieved by:  Nothing Worsened by:  Nothing tried Ineffective treatments: pain meds. Associated symptoms: no fever     Past Medical History  Diagnosis Date  . Allergy   . Arthritis   . Depression   . GERD (gastroesophageal reflux disease)   . Hyperlipidemia   . Hypertension   . Osteoporosis   . Hypothyroidism   . Anxiety   . Chronic pain syndrome   . Tobacco abuse   . Obesity   . Gastroparesis   . Ankylosing spondylitis   . Depression   . CKD (chronic kidney disease) stage 3, GFR 30-59 ml/min   . Nephrolithiasis    Past Surgical History  Procedure Laterality Date  . Total knee arthroplasty      left  . Back surgery    . Neck surgery    . Appendectomy    . Cholecystectomy    . Right knee arthroplasty    . Doppler echocardiography  09/26/2010    EF=>55%, LV norm  . Nm myocar perf wall motion  09/26/2010    Lexiscan EF 83%  EF may be overestimated due to LVH   Family History  Problem Relation Age of Onset  . Diabetes Mother   . Heart failure Mother   . Hyperlipidemia Mother   . Hypertension Mother   . Hyperlipidemia Father   . Cancer Son     lymphoma stage 4 at 30   History  Substance Use Topics  . Smoking status: Former Smoker -- 1.00 packs/day   Types: Cigarettes    Quit date: 10/12/2010  . Smokeless tobacco: Not on file     Comment: Following her long hospitalization, she never went back to smoking  . Alcohol Use: No   OB History   Grav Para Term Preterm Abortions TAB SAB Ect Mult Living                 Review of Systems  Constitutional: Negative for fever and chills.  Respiratory: Negative for cough and shortness of breath.   Gastrointestinal: Negative for vomiting and abdominal pain.  All other systems reviewed and are negative.     Allergies  Codeine and Latex  Home Medications   Prior to Admission medications   Medication Sig Start Date End Date Taking? Authorizing Provider  allopurinol (ZYLOPRIM) 100 MG tablet Take 1 tablet (100 mg total) by mouth every morning. 05/11/13  Yes Leone Haven, MD  aspirin EC 81 MG tablet Take 81 mg by mouth daily.     Yes Historical Provider, MD  cholecalciferol (VITAMIN D) 1000 UNITS tablet Take 1,000 Units by mouth every morning.    Yes Historical Provider, MD  colchicine 0.6 MG  tablet Take 1 tablet (0.6 mg total) by mouth daily as needed. 09/30/13  Yes Leone Haven, MD  cyclobenzaprine (FLEXERIL) 10 MG tablet Take 0.5 tablets (5 mg total) by mouth at bedtime. 05/11/13  Yes Leone Haven, MD  gabapentin (NEURONTIN) 100 MG capsule Take 200 mg by mouth 3 (three) times daily.  09/10/11  Yes Historical Provider, MD  lansoprazole (PREVACID) 30 MG capsule Take 30 mg by mouth daily at 12 noon.   Yes Historical Provider, MD  levothyroxine (SYNTHROID, LEVOTHROID) 75 MCG tablet Take 75 mcg by mouth daily before breakfast.   Yes Historical Provider, MD  metoprolol succinate (TOPROL-XL) 25 MG 24 hr tablet Take 1 tablet (25 mg total) by mouth daily. 12/29/12  Yes Leonie Man, MD  OxyCODONE (OXYCONTIN) 20 mg T12A 12 hr tablet Take 1 tablet (20 mg total) by mouth every 12 (twelve) hours. 06/23/13  Yes Leone Haven, MD  traZODone (DESYREL) 50 MG tablet Take 50 mg by mouth at bedtime.    Yes Historical Provider, MD   BP 118/76  Pulse 92  Temp(Src) 98.2 F (36.8 C) (Oral)  Resp 16  Ht 5\' 3"  (1.6 m)  Wt 215 lb (97.523 kg)  BMI 38.09 kg/m2  SpO2 98%  LMP 05/17/2009 Physical Exam  Nursing note and vitals reviewed. Constitutional: She is oriented to person, place, and time. She appears well-developed and well-nourished. No distress.  HENT:  Head: Normocephalic and atraumatic.  Mouth/Throat: Oropharynx is clear and moist.  Eyes: EOM are normal. Pupils are equal, round, and reactive to light.  Neck: Normal range of motion. Neck supple.  Cardiovascular: Normal rate and regular rhythm.  Exam reveals no friction rub.   No murmur heard. Pulmonary/Chest: Effort normal and breath sounds normal. No respiratory distress. She has no wheezes. She has no rales.  Abdominal: Soft. She exhibits no distension. There is no tenderness. There is no rebound.  Musculoskeletal: Normal range of motion. She exhibits no edema.       Right knee: She exhibits MCL laxity (states chronic). She exhibits normal range of motion, no swelling and no LCL laxity.       Legs: Area of mild erythema outlined in graphic No cellulitis, no induration, no fluctuance No effusion MCL laxity, no ant/posterior laxity Full ROM  Neurological: She is alert and oriented to person, place, and time.  Skin: She is not diaphoretic.    ED Course  Procedures (including critical care time) Labs Review Labs Reviewed - No data to display  Imaging Review No results found.   EKG Interpretation None      MDM   Final diagnoses:  Knee pain    49F presents with R knee pain. Hx of chronic pain, fibromyalgia, gout, arthritis. Remote R knee injury, states she was supposed to get knee replacement several years ago but has yet to do so. Does have L knee replacement. Was lying in the bed when the pain awoke her. States feels like prior dislocation of her knee, however I cannot find any history of knee dislocation in  her records, and spontaneous dislocation without trauma seems highly unlikely.  R knee with laxity in MCL, no anterior/posterior laxity. Has full ROM. Mild erythema over superior knee, distal quad, lateral knee, lateral superior tibia. No effusion. States laxity is chronic, it's now a "trick knee." NVI distally. Doubt dislocation with artery injury. No mechanism c/w dislocation. Hx of CKD, will hold off on contrast bolus with CT Angio. Will xray, give pain meds.  She is aware she cannot get pain meds to go since she's a chronic pain patient. Feeling better, easily ambulatory. Stable for discharge.  Evelina Bucy, MD 11/04/13 [phone]

## 2013-11-03 NOTE — ED Notes (Signed)
Pt states she is a chronic pain pt and the past two days her R knee is been increasingly hurting, redness, swelling and bruising to knee, denies injury.

## 2013-11-03 NOTE — Discharge Instructions (Signed)

## 2013-11-03 NOTE — ED Notes (Signed)
Ice Pack given to reduce swelling in Right Knee.

## 2013-11-18 DIAGNOSIS — M5137 Other intervertebral disc degeneration, lumbosacral region: Secondary | ICD-10-CM | POA: Diagnosis not present

## 2013-11-26 DIAGNOSIS — M545 Low back pain: Secondary | ICD-10-CM | POA: Diagnosis not present

## 2013-11-26 DIAGNOSIS — M546 Pain in thoracic spine: Secondary | ICD-10-CM | POA: Diagnosis not present

## 2013-11-26 DIAGNOSIS — M4328 Fusion of spine, sacral and sacrococcygeal region: Secondary | ICD-10-CM | POA: Diagnosis not present

## 2013-11-26 DIAGNOSIS — M791 Myalgia: Secondary | ICD-10-CM | POA: Diagnosis not present

## 2013-12-04 ENCOUNTER — Other Ambulatory Visit: Payer: Self-pay | Admitting: Family Medicine

## 2013-12-15 DIAGNOSIS — M5137 Other intervertebral disc degeneration, lumbosacral region: Secondary | ICD-10-CM | POA: Diagnosis not present

## 2013-12-31 ENCOUNTER — Other Ambulatory Visit: Payer: Self-pay | Admitting: Cardiology

## 2013-12-31 NOTE — Telephone Encounter (Signed)
Rx has been sent to the pharmacy electronically. ° °

## 2014-01-10 ENCOUNTER — Other Ambulatory Visit: Payer: Self-pay | Admitting: Family Medicine

## 2014-01-12 DIAGNOSIS — M5137 Other intervertebral disc degeneration, lumbosacral region: Secondary | ICD-10-CM | POA: Diagnosis not present

## 2014-01-31 ENCOUNTER — Other Ambulatory Visit: Payer: Self-pay | Admitting: Family Medicine

## 2014-02-01 ENCOUNTER — Other Ambulatory Visit: Payer: Self-pay | Admitting: Family Medicine

## 2014-02-03 ENCOUNTER — Other Ambulatory Visit: Payer: Self-pay | Admitting: Family Medicine

## 2014-02-07 ENCOUNTER — Ambulatory Visit (INDEPENDENT_AMBULATORY_CARE_PROVIDER_SITE_OTHER): Payer: PRIVATE HEALTH INSURANCE | Admitting: Family Medicine

## 2014-02-07 ENCOUNTER — Encounter: Payer: Self-pay | Admitting: Family Medicine

## 2014-02-07 VITALS — BP 118/82 | HR 99 | Temp 98.2°F | Ht 63.0 in | Wt 221.0 lb

## 2014-02-07 DIAGNOSIS — E559 Vitamin D deficiency, unspecified: Secondary | ICD-10-CM | POA: Diagnosis not present

## 2014-02-07 DIAGNOSIS — E039 Hypothyroidism, unspecified: Secondary | ICD-10-CM | POA: Diagnosis not present

## 2014-02-07 DIAGNOSIS — R51 Headache: Secondary | ICD-10-CM

## 2014-02-07 DIAGNOSIS — M791 Myalgia, unspecified site: Secondary | ICD-10-CM

## 2014-02-07 DIAGNOSIS — E038 Other specified hypothyroidism: Secondary | ICD-10-CM

## 2014-02-07 DIAGNOSIS — Z720 Tobacco use: Secondary | ICD-10-CM

## 2014-02-07 DIAGNOSIS — R519 Headache, unspecified: Secondary | ICD-10-CM

## 2014-02-07 LAB — TSH: TSH: 2.937 u[IU]/mL (ref 0.350–4.500)

## 2014-02-07 NOTE — Patient Instructions (Addendum)
Nice to see you. We will check on your thyroid and call with the results. Let me check with our pharmacist regarding the nicotine replacement and we will call you with instructions.  If you develop new weakness, numbness, vision changes, or slurred speech please seek medical attention.   General Headache Without Cause A general headache is pain or discomfort felt around the head or neck area. The cause may not be found.  HOME CARE   Keep all doctor visits.  Only take medicines as told by your doctor.  Lie down in a dark, quiet room when you have a headache.  Keep a journal to find out if certain things bring on headaches. For example, write down:  What you eat and drink.  How much sleep you get.  Any change to your diet or medicines.  Relax by getting a massage or doing other relaxing activities.  Put ice or heat packs on the head and neck area as told by your doctor.  Lessen stress.  Sit up straight. Do not tighten (tense) your muscles.  Quit smoking if you smoke.  Lessen how much alcohol you drink.  Lessen how much caffeine you drink, or stop drinking caffeine.  Eat and sleep on a regular schedule.  Get 7 to 9 hours of sleep, or as told by your doctor.  Keep lights dim if bright lights bother you or make your headaches worse. GET HELP RIGHT AWAY IF:   Your headache becomes really bad.  You have a fever.  You have a stiff neck.  You have trouble seeing.  Your muscles are weak, or you lose muscle control.  You lose your balance or have trouble walking.  You feel like you will pass out (faint), or you pass out.  You have really bad symptoms that are different than your first symptoms.  You have problems with the medicines given to you by your doctor.  Your medicines do not work.  Your headache feels different than the other headaches.  You feel sick to your stomach (nauseous) or throw up (vomit). MAKE SURE YOU:   Understand these  instructions.  Will watch your condition.  Will get help right away if you are not doing well or get worse. Document Released: 10/17/2007 Document Revised: 04/01/2011 Document Reviewed: 12/28/2010 [facility] Patient Information 2015 [place], [place]. This information is not intended to replace advice given to you by your health care provider. Make sure you discuss any questions you have with your health care provider.

## 2014-02-07 NOTE — Progress Notes (Signed)
Patient signed ROI to Dr. Andree Elk (preferred pain management). [name], [name]

## 2014-02-08 DIAGNOSIS — Z720 Tobacco use: Secondary | ICD-10-CM | POA: Insufficient documentation

## 2014-02-08 DIAGNOSIS — E559 Vitamin D deficiency, unspecified: Secondary | ICD-10-CM | POA: Insufficient documentation

## 2014-02-08 DIAGNOSIS — R51 Headache: Secondary | ICD-10-CM

## 2014-02-08 DIAGNOSIS — R519 Headache, unspecified: Secondary | ICD-10-CM | POA: Insufficient documentation

## 2014-02-08 LAB — VITAMIN D 25 HYDROXY (VIT D DEFICIENCY, FRACTURES): VIT D 25 HYDROXY: 20 ng/mL — AB (ref 30–100)

## 2014-02-08 NOTE — Assessment & Plan Note (Signed)
Patient interested in quitting though states she may not be able to do this until her birthday in [birth year]. Discussed lozenges as an option and she feels this may be a good option in the future. Will continue to discuss this with the patient in the future.

## 2014-02-08 NOTE — Assessment & Plan Note (Signed)
TSH normal. Continue current synthroid dosing.

## 2014-02-08 NOTE — Assessment & Plan Note (Signed)
Headaches sound as though they are migranous in nature. Could also be tension headaches. Could also be medication over use from her oxycodone. The fact that she has had increased frequency and is over the age of 29 is worrisome. She has no new neurological deficits at this time making an acute process unlikely. Will order MRI brain wo contrast to further evaluate this issue. She is to continue current pain medications at this time. I discussed return precautions with the patient.

## 2014-02-08 NOTE — Progress Notes (Signed)
Patient ID: [name], female   DOB: 08-06-1960, 54 y.o.   MRN: [id number]  Tommi Rumps, MD Phone: [phone]  [name] is a 54 y.o. female who presents today for f/u.  HYPOTHYROIDISM Disease Monitoring Weight changes: gaining weight  Skin Changes: yes, skin is drier than usual Hair is falling out.   Medication Monitoring Compliance:  Taking synthroid   Last TSH:   Lab Results  Component Value Date   TSH 2.937 02/07/2014   Vitamin D deficiency: has been taking vitamin D supplement. No issues with this medication.  Myalgias: has been followed by pain management for this. Dr Andree Elk. She reports having an EMG done and was told that she had bad results on the right. They are also doing spinal injections for her pain. She see's them once a month. Also on PO pain meds. She reports this issue is mildly improved.   Tobacco abuse: smokes 1 PPD. Smokes within 30 minutes of getting up. Has tried patches, but they irritated her skin. She is interested in quitting.   Headache: patient notes increasing frequency of headaches for the past 1.5 months. She notes she has a dull headache from the morning time that is in bilateral temples and on the right side. By the evening the headache has worsened. She note photophobia and phonophobia with this. There is some nausea with these HAs. She denies new numbness or weakness. She notes she has baseline right lateral leg, right arm, and right V2-3 diminished sensation for many years. She has only taken aspirin for this. She notes a history of migraines, though has not had any in several years. She notes intermittent tension headaches over the past several years.    Patient is a smoker.   ROS: Per HPI   Physical Exam Filed Vitals:   02/07/14 1457  BP: 118/82  Pulse: 99  Temp: 98.2 F (36.8 C)    Gen: Well NAD HEENT: PERRL,  MMM Lungs: CTABL Nl WOB Heart: RRR  Neuro: CN 2-12 intact, 5/5 strength in bilateral biceps, triceps, grip,  quads, hamstrings, plantar and dorsiflexion, sensation to light touch mildly diminished in right lateral leg, right arm, and right V2-V3 (all at baseline per patient) otherwise intact in bilateral UE and LE, normal gait Exts: Non edematous BL  LE, warm and well perfused.    Assessment/Plan: Please see individual problem list.  Tommi Rumps, MD [place] PGY-3

## 2014-02-08 NOTE — Assessment & Plan Note (Signed)
Improving with pain management. She will continue to follow with them.

## 2014-02-08 NOTE — Assessment & Plan Note (Signed)
Check vit D level. 

## 2014-02-09 ENCOUNTER — Other Ambulatory Visit: Payer: Self-pay | Admitting: Family Medicine

## 2014-02-09 NOTE — Telephone Encounter (Signed)
This medication has previously been filled by cardiology. The patient received a refill in December from cardiology and they stated that she needs to be seen prior to additional refills. Please contact her to let her know she needs to see them for refills on this medication. Thanks.

## 2014-02-12 ENCOUNTER — Other Ambulatory Visit: Payer: Self-pay | Admitting: Cardiology

## 2014-02-12 ENCOUNTER — Other Ambulatory Visit: Payer: Self-pay | Admitting: Physician Assistant

## 2014-02-12 MED ORDER — METOPROLOL SUCCINATE ER 25 MG PO TB24: 25.0000 mg | ORAL_TABLET | Freq: Every day | ORAL | Status: DC

## 2014-02-12 NOTE — Telephone Encounter (Signed)
     Patient ran out of metoprolol. Has had some recurrent chest pain without her medication. I refilled this for her and told her to call us back if she continues to have CP after she resumes her beta blocker    Angelena Form PA-C  [facility]

## 2014-02-24 ENCOUNTER — Ambulatory Visit (HOSPITAL_COMMUNITY)
Admission: RE | Admit: 2014-02-24 | Discharge: 2014-02-24 | Disposition: A | Discharge status: Home / Self Care | Payer: No Typology Code available for payment source | Source: Ambulatory Visit | Attending: Family Medicine | Admitting: Family Medicine

## 2014-02-24 DIAGNOSIS — R51 Headache: Secondary | ICD-10-CM | POA: Diagnosis not present

## 2014-02-24 DIAGNOSIS — R519 Headache, unspecified: Secondary | ICD-10-CM

## 2014-02-25 ENCOUNTER — Telehealth: Payer: Self-pay | Admitting: Family Medicine

## 2014-02-25 MED ORDER — VITAMIN D (ERGOCALCIFEROL) 1.25 MG (50000 UNIT) PO CAPS: 50000.0000 [IU] | ORAL_CAPSULE | ORAL | Status: DC

## 2014-02-25 NOTE — Telephone Encounter (Signed)
Called patient to inform of normal MRI. Will send in prescription for Vit D to treat vitamin D deficiency. Patient will need lipid panel at follow-up per her request.

## 2014-03-08 ENCOUNTER — Ambulatory Visit: Payer: Medicare Other | Admitting: Family Medicine

## 2014-03-17 ENCOUNTER — Encounter: Payer: Self-pay | Admitting: Family Medicine

## 2014-03-17 ENCOUNTER — Ambulatory Visit (INDEPENDENT_AMBULATORY_CARE_PROVIDER_SITE_OTHER): Payer: PRIVATE HEALTH INSURANCE | Admitting: Family Medicine

## 2014-03-17 VITALS — BP 124/86 | HR 102 | Temp 98.3°F | Ht 63.0 in | Wt 220.0 lb

## 2014-03-17 DIAGNOSIS — L821 Other seborrheic keratosis: Secondary | ICD-10-CM | POA: Diagnosis not present

## 2014-03-17 DIAGNOSIS — L989 Disorder of the skin and subcutaneous tissue, unspecified: Secondary | ICD-10-CM

## 2014-03-17 DIAGNOSIS — L918 Other hypertrophic disorders of the skin: Secondary | ICD-10-CM

## 2014-03-17 NOTE — Patient Instructions (Signed)
Nice to see you. We will call with the results of the biopsies. If you develop bleeding, redness, warmth, drainage, fever, or discomfort please seek medical attention.
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
## 2014-03-18 DIAGNOSIS — L989 Disorder of the skin and subcutaneous tissue, unspecified: Secondary | ICD-10-CM | POA: Insufficient documentation

## 2014-03-18 NOTE — Assessment & Plan Note (Signed)
Patient with multiple likely skin tags, though possible atypical nevi. These lesions were removed with dermablade and for pathology.

## 2014-03-24 DIAGNOSIS — M545 Low back pain: Secondary | ICD-10-CM | POA: Diagnosis not present

## 2014-03-24 DIAGNOSIS — M961 Postlaminectomy syndrome, not elsewhere classified: Secondary | ICD-10-CM | POA: Diagnosis not present

## 2014-03-29 ENCOUNTER — Encounter: Payer: Self-pay | Admitting: Family Medicine

## 2014-04-04 ENCOUNTER — Ambulatory Visit (INDEPENDENT_AMBULATORY_CARE_PROVIDER_SITE_OTHER): Payer: No Typology Code available for payment source | Admitting: Cardiology

## 2014-04-04 ENCOUNTER — Other Ambulatory Visit: Payer: Self-pay | Admitting: Family Medicine

## 2014-04-04 VITALS — BP 110/80 | HR 91 | Ht 64.0 in | Wt 218.6 lb

## 2014-04-04 DIAGNOSIS — E669 Obesity, unspecified: Secondary | ICD-10-CM

## 2014-04-04 DIAGNOSIS — I10 Essential (primary) hypertension: Secondary | ICD-10-CM | POA: Diagnosis not present

## 2014-04-04 DIAGNOSIS — E7800 Pure hypercholesterolemia, unspecified: Secondary | ICD-10-CM

## 2014-04-04 DIAGNOSIS — R002 Palpitations: Secondary | ICD-10-CM

## 2014-04-04 NOTE — Patient Instructions (Signed)
Continue current medications.  Your physician wants you to follow-up in 12 month Dr Ellyn Hack.  You will receive a reminder letter in the mail two months in advance. If you don't receive a letter, please call our office to schedule the follow-up appointment.

## 2014-04-05 ENCOUNTER — Encounter: Payer: Self-pay | Admitting: Cardiology
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
## 2014-04-05 NOTE — Assessment & Plan Note (Signed)
Labs are being monitored by PCP. Currently on does not appear to be on pravastatin. Likely related to fatigue and aching. Last lab values showed that her LDL was back to the previously abnormal level noted in February 2014. See results above.  Defer management to PCP.

## 2014-04-05 NOTE — Assessment & Plan Note (Signed)
Well-controlled on low-dose Toprol. °

## 2014-04-05 NOTE — Assessment & Plan Note (Signed)
Difficult situation with her poor energy level leading to poor exercise level. The patient understands the need to lose weight with diet and exercise. We have discussed specific strategies for this.

## 2014-04-05 NOTE — Assessment & Plan Note (Addendum)
Well-controlled on beta blocker. Continue current low-dose beta blocker. This is also helping with some of her anxiety. OK to use when necessary additional half dose.

## 2014-04-27 ENCOUNTER — Other Ambulatory Visit: Payer: Self-pay | Admitting: Family Medicine

## 2014-04-28 NOTE — Telephone Encounter (Signed)
Patient needs to come in for recheck of vitamin D. Please call the patient to set up a lab appointment. Will then be able to determine need for further vitamin D supplements.

## 2014-04-29 NOTE — Telephone Encounter (Signed)
Spoke with patient and she voiced understanding on this.  She will call back to schedule this appt. Prophet Renwick,CMA

## 2014-05-07 ENCOUNTER — Other Ambulatory Visit: Payer: Self-pay | Admitting: Family Medicine

## 2014-05-09 DIAGNOSIS — F419 Anxiety disorder, unspecified: Secondary | ICD-10-CM | POA: Diagnosis not present

## 2014-05-09 DIAGNOSIS — G894 Chronic pain syndrome: Secondary | ICD-10-CM | POA: Diagnosis not present

## 2014-05-09 DIAGNOSIS — M5417 Radiculopathy, lumbosacral region: Secondary | ICD-10-CM | POA: Diagnosis not present

## 2014-05-09 DIAGNOSIS — F4542 Pain disorder with related psychological factors: Secondary | ICD-10-CM | POA: Diagnosis not present

## 2014-05-09 DIAGNOSIS — M961 Postlaminectomy syndrome, not elsewhere classified: Secondary | ICD-10-CM | POA: Diagnosis not present

## 2014-06-07 ENCOUNTER — Other Ambulatory Visit: Payer: Self-pay | Admitting: Family Medicine

## 2014-06-07 NOTE — Telephone Encounter (Signed)
Refill given. Patient needs follow-up for physical and pap smear. Please inform her of this. Thanks.

## 2014-06-07 NOTE — Telephone Encounter (Signed)
Letter mailed to patient. Vernis Eid,CMA  

## 2014-06-19 ENCOUNTER — Other Ambulatory Visit: Payer: Self-pay | Admitting: Family Medicine

## 2014-06-20 NOTE — Telephone Encounter (Signed)
Patient needs a follow-up in the next 1-2 months. Refills given. Please advise patient.

## 2014-06-21 NOTE — Telephone Encounter (Signed)
Letter mailed to patient. Ilijah Doucet,CMA  

## 2014-07-27 ENCOUNTER — Other Ambulatory Visit: Payer: Self-pay | Admitting: Family Medicine

## 2014-08-19 ENCOUNTER — Other Ambulatory Visit: Payer: Self-pay | Admitting: Family Medicine

## 2014-08-19 NOTE — Telephone Encounter (Signed)
Please advise as Dr. Caryl Bis is in a different office now.

## 2014-08-19 NOTE — Telephone Encounter (Signed)
Sent our office in error

## 2014-08-27 ENCOUNTER — Other Ambulatory Visit: Payer: Self-pay | Admitting: Family Medicine

## 2014-09-01 ENCOUNTER — Other Ambulatory Visit: Payer: Self-pay | Admitting: Family Medicine

## 2014-09-15 ENCOUNTER — Encounter: Payer: No Typology Code available for payment source | Admitting: Family Medicine

## 2014-09-16 ENCOUNTER — Other Ambulatory Visit: Payer: Self-pay | Admitting: Family Medicine

## 2014-09-19 NOTE — Progress Notes (Signed)
Pt. Did not get the lab drawn. Will discuss at next visit.   CGM MD

## 2014-09-23 ENCOUNTER — Ambulatory Visit (INDEPENDENT_AMBULATORY_CARE_PROVIDER_SITE_OTHER): Payer: No Typology Code available for payment source | Admitting: Family Medicine

## 2014-09-23 ENCOUNTER — Other Ambulatory Visit (HOSPITAL_COMMUNITY)
Admission: RE | Admit: 2014-09-23 | Discharge: 2014-09-23 | Disposition: A | Discharge status: Home / Self Care | Payer: No Typology Code available for payment source | Source: Ambulatory Visit | Attending: Family Medicine | Admitting: Family Medicine

## 2014-09-23 VITALS — BP 154/94 | HR 90 | Temp 97.9°F | Ht 64.0 in | Wt 201.4 lb

## 2014-09-23 DIAGNOSIS — Z01411 Encounter for gynecological examination (general) (routine) with abnormal findings: Secondary | ICD-10-CM | POA: Insufficient documentation

## 2014-09-23 DIAGNOSIS — N182 Chronic kidney disease, stage 2 (mild): Secondary | ICD-10-CM | POA: Diagnosis not present

## 2014-09-23 DIAGNOSIS — Z1151 Encounter for screening for human papillomavirus (HPV): Secondary | ICD-10-CM | POA: Insufficient documentation

## 2014-09-23 DIAGNOSIS — Z0000 Encounter for general adult medical examination without abnormal findings: Secondary | ICD-10-CM

## 2014-09-23 LAB — BASIC METABOLIC PANEL WITH GFR
BUN: 11 mg/dL (ref 7–25)
CALCIUM: 9.3 mg/dL (ref 8.6–10.4)
CO2: 29 mmol/L (ref 20–31)
CREATININE: 1.03 mg/dL (ref 0.50–1.05)
Chloride: 99 mmol/L (ref 98–110)
GFR, EST AFRICAN AMERICAN: 71 mL/min (ref >=60)
GFR, Est Non African American: 62 mL/min (ref >=60)
GLUCOSE: 93 mg/dL (ref 65–99)
Potassium: 4.5 mmol/L (ref 3.5–5.3)
Sodium: 138 mmol/L (ref 135–146)

## 2014-09-23 LAB — CBC
HCT: 52.2 % — ABNORMAL HIGH (ref 36.0–46.0)
HEMOGLOBIN: 17.2 g/dL — AB (ref 12.0–15.0)
MCH: 29.6 pg (ref 26.0–34.0)
MCHC: 33 g/dL (ref 30.0–36.0)
MCV: 89.8 fL (ref 78.0–100.0)
MPV: 10.5 fL (ref 8.6–12.4)
PLATELETS: 266 10*3/uL (ref 150–400)
RBC: 5.81 MIL/uL — ABNORMAL HIGH (ref 3.87–5.11)
RDW: 15.7 % — ABNORMAL HIGH (ref 11.5–15.5)
WBC: 9.1 10*3/uL (ref 4.0–10.5)

## 2014-09-23 NOTE — Patient Instructions (Signed)
Thanks for coming in today.   I will call you with the results of your blood work, and your pap smear.   You should call to set up an appointment to get a mammogram done.   Let them know at the front desk that you need an appointment for derm clinic to get moles removed.   Also, schedule a follow up appointment with me in 1-2 months to talk about quitting smoking.   Thanks for letting us take care of you!  Sincerely ,  Paula Compton, MD Family Medicine - PGY 2

## 2014-09-28 ENCOUNTER — Telehealth: Payer: Self-pay | Admitting: Family Medicine

## 2014-09-28 LAB — CYTOLOGY - PAP

## 2014-09-28 NOTE — Telephone Encounter (Signed)
Pt. Called with results. She acknowledged. We will recheck her CBC in about 3 months to make sure that her hematocrit does not continue to rise. Elevated Hgb is most likely from smoking.   CGM MD

## 2014-09-30 ENCOUNTER — Other Ambulatory Visit: Payer: Self-pay | Admitting: Internal Medicine

## 2014-10-03 NOTE — Telephone Encounter (Signed)
Not our patient please advise?

## 2014-10-04 ENCOUNTER — Telehealth: Payer: Self-pay | Admitting: Family Medicine

## 2014-10-04 NOTE — Progress Notes (Signed)
Patient ID: [name], female   DOB: 11-03-1960, 54 y.o.   MRN: [id number]   [facility] Aquilla Hacker, MD Phone: [phone]  Subjective:   # Check Up  - Pt. Has been doing well overall.  - She says that she would like her kidney function checked as she has only one kidney.  - She was apparently in organ failure some time ago and has been followed intermittently by nephrology ever since. - She has hypothyroidism and has been stable on her current dose of synthroid.  - She otherwise has some arthritis that is well controlled with her current medications.  - She additionally, wants her screening pap smear done today. 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  - She otherwise would like to quit smoking, and wants to schedule a visit to discuss this.   All relevant systems were reviewed and were negative unless otherwise noted in the HPI  Past Medical History Reviewed problem list.  Medications- reviewed and updated Current Outpatient Prescriptions  Medication Sig Dispense Refill  . allopurinol (ZYLOPRIM) 100 MG tablet TAKE 1 TABLET BY MOUTH EVERY MORNING 60 tablet 0  . aspirin EC 81 MG tablet Take 81 mg by mouth daily.      . colchicine 0.6 MG tablet TAKE 1 TABLET BY MOUTH EVERY DAY AS NEEDED 30 tablet 0  . cyclobenzaprine (FLEXERIL) 10 MG tablet Take 0.5 tablets (5 mg total) by mouth at bedtime. 30 tablet 1  . gabapentin (NEURONTIN) 100 MG capsule Take 200 mg by mouth 3 (three) times daily.     . lansoprazole (PREVACID) 30 MG capsule TAKE 1 CAPSULE BY MOUTH EVERY DAY 30 capsule 0  . levothyroxine (SYNTHROID, LEVOTHROID) 75 MCG tablet TAKE 1 TABLET BY MOUTH EVERY DAY 30 tablet 3  . metoprolol succinate (TOPROL-XL) 25 MG 24 hr tablet Take 1 tablet (25 mg total) by mouth daily. Need appointment before anymore refills 30 tablet 11  . MOVANTIK 25 MG TABS Take 1 tablet by mouth daily.  0  . OxyCODONE (OXYCONTIN) 20 mg  T12A 12 hr tablet Take 1 tablet (20 mg total) by mouth every 12 (twelve) hours. 60 tablet 0  . traZODone (DESYREL) 50 MG tablet TAKE 1/2 TO 1 TABLET BY MOUTH EVERY NIGHT AT BEDTIME AS NEEDED FOR SLEEP 30 tablet 0  . Vitamin D, Ergocalciferol, (DRISDOL) 50000 UNITS CAPS capsule Take 1 capsule (50,000 Units total) by mouth every 7 (seven) days. 8 capsule 0   No current facility-administered medications for this visit.   Chief complaint-noted No additions to family history Social history- patient is a current 2ppd smoker  Objective: BP 154/94 mmHg  Pulse 90  Temp(Src) 97.9 F (36.6 C) (Oral)  Ht 5\' 4"  (1.626 m)  Wt 201 lb 6.4 oz (91.354 kg)  BMI 34.55 kg/m2  LMP 05/17/2009 Gen: NAD, alert, cooperative with exam HEENT: NCAT, EOMI, PERRL, TMs nml Neck: FROM, supple CV: RRR, good S1/S2, no murmur Resp: CTABL, no wheezes, non-labored Abd: SNTND, BS present, no guarding or organomegaly GU: Pap smear performed. Cervix visualized without evidence of lesions or abnormal growth. No vaginal lesions noted. Bimanual without CMT.  Ext: No edema, warm, normal tone, moves UE/LE spontaneously Neuro: Alert and oriented, No gross deficits Skin: no rashes no lesions  Assessment/Plan:  # Check Up / Pap Smear: Pt. Needs mammogram as part of her screening. Otherwise seems to be doing well. She needs to  quit smoking and recognizes this.  - Pap smear performed. Will f/u results.  - Screening blood work today. - Last TSH appropriate on her current synthroid regimen.  - She was given the information to get a mammogram set up.  - Follow up in 2 months for smoking.

## 2014-10-04 NOTE — Telephone Encounter (Signed)
Called pt. And left VM about negative pap result. She can call back with any questions. Otherwise will see her at her next office visit.   CGM MD

## 2014-10-10 ENCOUNTER — Other Ambulatory Visit: Payer: Self-pay | Admitting: Family Medicine

## 2014-10-26 ENCOUNTER — Ambulatory Visit: Payer: No Typology Code available for payment source

## 2014-11-03 ENCOUNTER — Telehealth: Payer: Self-pay | Admitting: *Deleted

## 2014-11-03 NOTE — Telephone Encounter (Signed)
Patient left a message on nurse line asking if she could take Sudafed along with her Synthroid.  Patient is having some sinus problems.  Left message for patient that is ok to Sudafed while taking Synthroid per Dr. Erin Hearing.  Derl Barrow, RN

## 2014-11-08 ENCOUNTER — Other Ambulatory Visit: Payer: Self-pay | Admitting: Family Medicine

## 2014-11-11 ENCOUNTER — Ambulatory Visit: Payer: No Typology Code available for payment source | Admitting: Family Medicine

## 2014-11-21 ENCOUNTER — Other Ambulatory Visit: Payer: Self-pay | Admitting: Family Medicine

## 2014-11-22 ENCOUNTER — Ambulatory Visit (INDEPENDENT_AMBULATORY_CARE_PROVIDER_SITE_OTHER): Payer: No Typology Code available for payment source | Admitting: Family Medicine

## 2014-11-22 ENCOUNTER — Encounter: Payer: Self-pay | Admitting: Family Medicine

## 2014-11-22 VITALS — BP 122/76 | HR 93 | Temp 98.5°F | Ht 64.0 in | Wt 208.0 lb

## 2014-11-22 DIAGNOSIS — R7989 Other specified abnormal findings of blood chemistry: Secondary | ICD-10-CM | POA: Diagnosis not present

## 2014-11-22 DIAGNOSIS — E559 Vitamin D deficiency, unspecified: Secondary | ICD-10-CM

## 2014-11-22 DIAGNOSIS — N183 Chronic kidney disease, stage 3 unspecified: Secondary | ICD-10-CM

## 2014-11-22 DIAGNOSIS — Z720 Tobacco use: Secondary | ICD-10-CM

## 2014-11-22 DIAGNOSIS — Z716 Tobacco abuse counseling: Secondary | ICD-10-CM | POA: Diagnosis not present

## 2014-11-22 DIAGNOSIS — Z23 Encounter for immunization: Secondary | ICD-10-CM

## 2014-11-22 MED ORDER — VARENICLINE TARTRATE 0.5 MG X 11 & 1 MG X 42 PO MISC: ORAL | Status: DC

## 2014-11-22 MED ORDER — LEVOTHYROXINE SODIUM 75 MCG PO TABS: 75.0000 ug | ORAL_TABLET | Freq: Every day | ORAL | Status: DC

## 2014-11-22 MED ORDER — ALLOPURINOL 100 MG PO TABS: 100.0000 mg | ORAL_TABLET | Freq: Every morning | ORAL | Status: DC

## 2014-11-22 NOTE — Patient Instructions (Signed)
Thanks for coming in today.   We have refilled your medications.   Your STOP date for smoking is 12/22/2014.   You will start the Chantix to help you quit smoking as soon as you get home.   Follow the directions on the package. You may have some nausea. If this becomes intolerable, please call to let us know.   Work on cutting back on how much you are smoking before 12/22/2014.   Thanks for letting us take care of you.   Sincerely,  Paula Compton, MD Family Medicine - PGY 2

## 2014-11-23 LAB — VITAMIN D 25 HYDROXY (VIT D DEFICIENCY, FRACTURES): Vit D, 25-Hydroxy: 21 ng/mL — ABNORMAL LOW (ref 30–100)

## 2014-11-28 ENCOUNTER — Telehealth: Payer: Self-pay | Admitting: Family Medicine

## 2014-11-28 NOTE — Telephone Encounter (Signed)
Will forward to MD. Jamarian Jacinto,CMA  

## 2014-11-28 NOTE — Telephone Encounter (Signed)
Unable to take Chantix--twitching and unable to sleep because mind wont shut down

## 2014-11-29 DIAGNOSIS — Z716 Tobacco abuse counseling: Secondary | ICD-10-CM | POA: Insufficient documentation

## 2014-11-29 NOTE — Assessment & Plan Note (Signed)
Pt. Desires to quit. No red flags at this time. Agreeable to chantix. Discussed side effects of the medication.  - Starting pack written for.  - Stop date 12/22/2014.  - She will work on reducing the amount she smokes to 1/2 ppd at that time.  - She will follow up in early December.

## 2014-11-29 NOTE — Progress Notes (Signed)
Patient ID: [name], female   DOB: [birth year], 54 y.o.   MRN: [id number]   [facility] Aquilla Hacker, MD Phone: [phone]  Subjective:   # Encounter for Smoking Cessation - Pt. Presents today desiring to quit smoking.  - She smokes 1ppd for the past 30 + years.  - She says she has a hard time quitting because she mostly smokes due to anxiety.  - She has tried nicotine patches before with no luck.  - She has set a stop date of 12/22/2014.  - she is agreeable to trying Chantix.  - No seizure hx.  - She has a morning cough without sputum production.  - No hemoptysis, no recent weight loss, chest pain, SOB, or swelling of her lower extremities.  - She has a strong desire to quit.   # Medication Refill  - Needs refill on colchicine - Refill on Synthroid - Refill on Prevacid.  - No issues with these.  - Just needs a refill.   All relevant systems were reviewed and were negative unless otherwise noted in the HPI  Past Medical History Reviewed problem list.  Medications- reviewed and updated Current Outpatient Prescriptions  Medication Sig Dispense Refill  . allopurinol (ZYLOPRIM) 100 MG tablet Take 1 tablet (100 mg total) by mouth every morning. 60 tablet 5  . aspirin EC 81 MG tablet Take 81 mg by mouth daily.      . colchicine 0.6 MG tablet TAKE 1 TABLET BY MOUTH EVERY DAY AS NEEDED 30 tablet 0  . cyclobenzaprine (FLEXERIL) 10 MG tablet Take 0.5 tablets (5 mg total) by mouth at bedtime. 30 tablet 1  . gabapentin (NEURONTIN) 100 MG capsule Take 200 mg by mouth 3 (three) times daily.     . lansoprazole (PREVACID) 30 MG capsule TAKE 1 CAPSULE BY MOUTH EVERY DAY 30 capsule 0  . levothyroxine (SYNTHROID, LEVOTHROID) 75 MCG tablet Take 1 tablet (75 mcg total) by mouth daily. 90 tablet 4  . metoprolol succinate (TOPROL-XL) 25 MG 24 hr tablet Take 1 tablet (25 mg total) by mouth daily. Need appointment before anymore refills 30 tablet 11  . MOVANTIK 25 MG  TABS Take 1 tablet by mouth daily.  0  . OxyCODONE (OXYCONTIN) 20 mg T12A 12 hr tablet Take 1 tablet (20 mg total) by mouth every 12 (twelve) hours. 60 tablet 0  . traZODone (DESYREL) 50 MG tablet TAKE 1/2 TO 1 TABLET BY MOUTH EVERY NIGHT AT BEDTIME AS NEEDED FOR SLEEP 30 tablet 0  . varenicline (CHANTIX STARTING MONTH PAK) 0.5 MG X 11 & 1 MG X 42 tablet Take one 0.5 mg tablet by mouth once daily for 3 days, then increase to one 0.5 mg tablet twice daily for 4 days, then increase to one 1 mg tablet twice daily. 53 tablet 0  . Vitamin D, Ergocalciferol, (DRISDOL) 50000 UNITS CAPS capsule Take 1 capsule (50,000 Units total) by mouth every 7 (seven) days. 8 capsule 0   No current facility-administered medications for this visit.   Chief complaint-noted No additions to family history Social history- patient is a 1ppd smoker  Objective: BP 122/76 mmHg  Pulse 93  Temp(Src) 98.5 F (36.9 C) (Oral)  Ht 5\' 4"  (1.626 m)  Wt 208 lb (94.348 kg)  BMI 35.69 kg/m2  LMP 05/17/2009 Gen: NAD, alert, cooperative with exam HEENT: NCAT, EOMI, PERRL Neck: FROM, supple CV: RRR, good S1/S2, no murmur Resp: CTABL, no wheezes, non-labored Abd: SNTND, BS present, no  guarding or organomegaly Ext: No edema, warm, normal tone, moves UE/LE spontaneously Neuro: Alert and oriented, No gross deficits Skin: no rashes no lesions  Assessment/Plan:  # Med Refill  - meds refilled. No issues.   Encounter for smoking cessation counseling Pt. Desires to quit. No red flags at this time. Agreeable to chantix. Discussed side effects of the medication.  - Starting pack written for.  - Stop date 12/22/2014.  - She will work on reducing the amount she smokes to 1/2 ppd at that time.  - She will follow up in early December.

## 2014-12-01 ENCOUNTER — Telehealth: Payer: Self-pay | Admitting: Family Medicine

## 2014-12-01 ENCOUNTER — Telehealth: Payer: Self-pay | Admitting: Physician Assistant

## 2014-12-01 MED ORDER — NICOTINE 21 MG/24HR TD PT24: 21.0000 mg | MEDICATED_PATCH | Freq: Every day | TRANSDERMAL | Status: DC

## 2014-12-01 MED ORDER — VITAMIN D (ERGOCALCIFEROL) 1.25 MG (50000 UNIT) PO CAPS: 50000.0000 [IU] | ORAL_CAPSULE | ORAL | Status: DC

## 2014-12-01 NOTE — Telephone Encounter (Signed)
Called to discuss chantix. Pt. With side effects including anxiety and insomnia. She has stopped taking it. I agree. Will hold off for now. Going on to the nicotine patches again. She will start in December as she does not have money to buy them now. She is continuing to work on backing down on the amount that she is smoking. Will see her in the clinic in December.   Noted low Vitamin D level. Sent in prescription for Vitamin D. Patient aware.    CGM MD

## 2014-12-01 NOTE — Telephone Encounter (Deleted)
I asked her to check her carotid pulse, current HR about 108 beats per min (27 beats per 15 sec period).

## 2014-12-01 NOTE — Telephone Encounter (Addendum)
Mrs. Watwood is a patient of Dr. Ellyn Hack, on 25mg  Toprol XL for palpitation and anxiety. I could not find any event monitor in the past to see what's causing the palpitation, SVT vs afib vs aflutter. Last echo in 11/2010 was normal. VT less likely. She has been taking Toprol.   She started having palpitation around 5:30am, the episode lasted about a hour. She contacted the after hour answering service immediately after taking Toprol XL around 6:20am, I have instructed her to take additional 1/2 tablet of Toprol XL if her symptom does not resolve by 7:30am. And if her symptom worsens, then she should come to the ED. If her symptom is about the same 2 hrs after taking additional 1/2 tablet of Toprol, then she can call us again for instruction.   She may need a 30 day event monitor or EKG in the clinic while having symptom to determine the type of rhythm causing palpitation.  Hilbert Corrigan PA Pager: [phone]   I asked her to check her carotid pulse, current HR about 108 beats per min (27 beats per 15 sec period).   Hilbert Corrigan PA Pager: [phone]

## 2014-12-01 NOTE — Telephone Encounter (Signed)
See other phone note. Addressed the medication issue.   CGM MD

## 2014-12-12 ENCOUNTER — Other Ambulatory Visit: Payer: Self-pay | Admitting: Family Medicine

## 2014-12-12 ENCOUNTER — Emergency Department (HOSPITAL_COMMUNITY)
Admission: EM | Admit: 2014-12-12 | Discharge: 2014-12-13 | Disposition: A | Discharge status: Home / Self Care | Payer: No Typology Code available for payment source | Attending: Emergency Medicine | Admitting: Emergency Medicine

## 2014-12-12 ENCOUNTER — Emergency Department (HOSPITAL_COMMUNITY): Payer: No Typology Code available for payment source

## 2014-12-12 ENCOUNTER — Encounter (HOSPITAL_COMMUNITY): Payer: Self-pay | Admitting: Emergency Medicine

## 2014-12-12 DIAGNOSIS — G894 Chronic pain syndrome: Secondary | ICD-10-CM | POA: Diagnosis not present

## 2014-12-12 DIAGNOSIS — K219 Gastro-esophageal reflux disease without esophagitis: Secondary | ICD-10-CM | POA: Diagnosis not present

## 2014-12-12 DIAGNOSIS — R002 Palpitations: Secondary | ICD-10-CM | POA: Insufficient documentation

## 2014-12-12 DIAGNOSIS — M1990 Unspecified osteoarthritis, unspecified site: Secondary | ICD-10-CM | POA: Insufficient documentation

## 2014-12-12 DIAGNOSIS — J40 Bronchitis, not specified as acute or chronic: Secondary | ICD-10-CM | POA: Diagnosis not present

## 2014-12-12 DIAGNOSIS — I129 Hypertensive chronic kidney disease with stage 1 through stage 4 chronic kidney disease, or unspecified chronic kidney disease: Secondary | ICD-10-CM | POA: Insufficient documentation

## 2014-12-12 DIAGNOSIS — E039 Hypothyroidism, unspecified: Secondary | ICD-10-CM | POA: Diagnosis not present

## 2014-12-12 DIAGNOSIS — E669 Obesity, unspecified: Secondary | ICD-10-CM | POA: Insufficient documentation

## 2014-12-12 DIAGNOSIS — Z79899 Other long term (current) drug therapy: Secondary | ICD-10-CM | POA: Insufficient documentation

## 2014-12-12 DIAGNOSIS — Z87442 Personal history of urinary calculi: Secondary | ICD-10-CM | POA: Insufficient documentation

## 2014-12-12 DIAGNOSIS — R079 Chest pain, unspecified: Secondary | ICD-10-CM | POA: Diagnosis present

## 2014-12-12 DIAGNOSIS — F329 Major depressive disorder, single episode, unspecified: Secondary | ICD-10-CM | POA: Insufficient documentation

## 2014-12-12 DIAGNOSIS — F419 Anxiety disorder, unspecified: Secondary | ICD-10-CM | POA: Insufficient documentation

## 2014-12-12 DIAGNOSIS — Z87891 Personal history of nicotine dependence: Secondary | ICD-10-CM | POA: Insufficient documentation

## 2014-12-12 DIAGNOSIS — Z7982 Long term (current) use of aspirin: Secondary | ICD-10-CM | POA: Insufficient documentation

## 2014-12-12 DIAGNOSIS — N183 Chronic kidney disease, stage 3 (moderate): Secondary | ICD-10-CM | POA: Diagnosis not present

## 2014-12-12 DIAGNOSIS — R0789 Other chest pain: Secondary | ICD-10-CM | POA: Diagnosis not present

## 2014-12-12 LAB — BASIC METABOLIC PANEL
ANION GAP: 7 (ref 5–15)
BUN: 19 mg/dL (ref 6–20)
CALCIUM: 9.1 mg/dL (ref 8.9–10.3)
CO2: 30 mmol/L (ref 22–32)
Chloride: 102 mmol/L (ref 101–111)
Creatinine, Ser: 1.39 mg/dL — ABNORMAL HIGH (ref 0.44–1.00)
GFR, EST AFRICAN AMERICAN: 49 mL/min — AB (ref >60)
GFR, EST NON AFRICAN AMERICAN: 42 mL/min — AB (ref >60)
GLUCOSE: 103 mg/dL — AB (ref 65–99)
POTASSIUM: 4.7 mmol/L (ref 3.5–5.1)
Sodium: 139 mmol/L (ref 135–145)

## 2014-12-12 LAB — CBC
HEMATOCRIT: 50.4 % — AB (ref 36.0–46.0)
HEMOGLOBIN: 16.2 g/dL — AB (ref 12.0–15.0)
MCH: 32.3 pg (ref 26.0–34.0)
MCHC: 32.1 g/dL (ref 30.0–36.0)
MCV: 100.4 fL — ABNORMAL HIGH (ref 78.0–100.0)
Platelets: 293 10*3/uL (ref 150–400)
RBC: 5.02 MIL/uL (ref 3.87–5.11)
RDW: 17 % — ABNORMAL HIGH (ref 11.5–15.5)
WBC: 8 10*3/uL (ref 4.0–10.5)

## 2014-12-12 LAB — I-STAT TROPONIN, ED: TROPONIN I, POC: 0 ng/mL (ref 0.00–0.08)

## 2014-12-12 MED ORDER — OXYCODONE-ACETAMINOPHEN 5-325 MG PO TABS
1.0000 | ORAL_TABLET | Freq: Once | ORAL | Status: AC
Start: 2014-12-13 — End: 2014-12-13
  Administered 2014-12-13: 1 via ORAL
  Filled 2014-12-12: qty 1

## 2014-12-12 NOTE — ED Provider Notes (Signed)
CSN: [id number]   Arrival date & time 12/12/14 2142  History  By signing my name below, I, [name], attest that this documentation has been prepared under the direction and in the presence of Candice Greek, MD. Electronically Signed: Altamease Paul, ED Scribe. 12/12/2014. 11:59 PM.  Chief Complaint  Patient presents with  . Chest Pain    HPI The history is provided by the patient. No language interpreter was used.   [name] is a 54 y.o. female with history of HTN, HLD, CKD, GERD, and anxiety who presents to the Emergency Department complaining of gradually improving, 4/10 at present, aching, central chest pain with onset tonight around 8 PM. Pt states that she has episodes of palpitations and anxiety that are followed by chest discomfort. Associated symptoms include left arm pain, recent dry cough, emesis, and "wobbly" LE weakness.  Her cardiologist is Dr. Selena Batten.  Past Medical History  Diagnosis Date  . Allergy   . Arthritis   . Depression   . GERD (gastroesophageal reflux disease)   . Hyperlipidemia   . Hypertension   . Osteoporosis   . Hypothyroidism   . Anxiety   . Chronic pain syndrome   . Tobacco abuse   . Obesity   . Gastroparesis   . Ankylosing spondylitis ([place])   . Depression   . CKD (chronic kidney disease) stage 3, GFR 30-59 ml/min   . Nephrolithiasis     Past Surgical History  Procedure Laterality Date  . Total knee arthroplasty      left  . Back surgery    . Neck surgery    . Appendectomy    . Cholecystectomy    . Right knee arthroplasty    . Doppler echocardiography  09/26/2010    EF=>55%, LV norm  . Nm myocar perf wall motion  09/26/2010    Lexiscan EF 83%  EF may be overestimated due to LVH    Family History  Problem Relation Age of Onset  . Diabetes Mother   . Heart failure Mother   . Hyperlipidemia Mother   . Hypertension Mother   . Hyperlipidemia Father   . Cancer Son     lymphoma stage 4 at 74    Social History   Substance Use Topics  . Smoking status: Former Smoker -- 1.00 packs/day    Types: Cigarettes    Quit date: 10/12/2010  . Smokeless tobacco: None     Comment: Following her long hospitalization, she never went back to smoking  . Alcohol Use: No     Review of Systems  Respiratory: Positive for cough and shortness of breath.   Cardiovascular: Positive for chest pain.  Gastrointestinal: Positive for vomiting.  Neurological: Positive for weakness (LE).  Psychiatric/Behavioral: The patient is nervous/anxious.   All other systems reviewed and are negative.  Home Medications   Prior to Admission medications   Medication Sig Start Date End Date Taking? Authorizing Provider  allopurinol (ZYLOPRIM) 100 MG tablet Take 1 tablet (100 mg total) by mouth every morning. 11/22/14  Yes Aquilla Hacker, MD  aspirin EC 81 MG tablet Take 81 mg by mouth daily.     Yes Historical Provider, MD  colchicine 0.6 MG tablet TAKE 1 TABLET BY MOUTH EVERY DAY AS NEEDED 10/04/14  Yes Aquilla Hacker, MD  gabapentin (NEURONTIN) 100 MG capsule Take 200 mg by mouth 3 (three) times daily.  09/10/11  Yes Historical Provider, MD  lansoprazole (PREVACID) 30 MG capsule TAKE 1 CAPSULE BY  MOUTH EVERY DAY 11/22/14  Yes Aquilla Hacker, MD  levothyroxine (SYNTHROID, LEVOTHROID) 75 MCG tablet Take 1 tablet (75 mcg total) by mouth daily. 11/22/14  Yes Aquilla Hacker, MD  metoprolol succinate (TOPROL-XL) 25 MG 24 hr tablet Take 1 tablet (25 mg total) by mouth daily. Need appointment before anymore refills 02/12/14  Yes Eileen Stanford, PA-C  MOVANTIK 25 MG TABS Take 1 tablet by mouth daily as needed (to use the bathroom).  02/10/14  Yes Historical Provider, MD  traZODone (DESYREL) 50 MG tablet TAKE 1/2 TO 1 TABLET BY MOUTH EVERY NIGHT AT BEDTIME AS NEEDED FOR SLEEP 11/09/14  Yes Aquilla Hacker, MD  Vitamin D, Ergocalciferol, (DRISDOL) 50000 UNITS CAPS capsule Take 1 capsule (50,000 Units total) by mouth every 7 (seven) days.  12/01/14  Yes Aquilla Hacker, MD  cyclobenzaprine (FLEXERIL) 10 MG tablet Take 0.5 tablets (5 mg total) by mouth at bedtime. Patient not taking: Reported on 12/12/2014 05/11/13   Leone Haven, MD  nicotine (NICODERM CQ - DOSED IN MG/24 HOURS) 21 mg/24hr patch Place 1 patch (21 mg total) onto the skin daily. Patient not taking: Reported on 12/12/2014 12/01/14   Aquilla Hacker, MD  OxyCODONE (OXYCONTIN) 20 mg T12A 12 hr tablet Take 1 tablet (20 mg total) by mouth every 12 (twelve) hours. Patient not taking: Reported on 12/12/2014 06/23/13   Leone Haven, MD  varenicline (CHANTIX STARTING MONTH PAK) 0.5 MG X 11 & 1 MG X 42 tablet Take one 0.5 mg tablet by mouth once daily for 3 days, then increase to one 0.5 mg tablet twice daily for 4 days, then increase to one 1 mg tablet twice daily. Patient not taking: Reported on 12/12/2014 11/22/14   Aquilla Hacker, MD    Allergies  Codeine; Acyclovir and related; and Latex  Triage Vitals: BP 107/69 mmHg  Pulse 81  Temp(Src) 98 F (36.7 C) (Oral)  Resp 14  Ht 5\' 4"  (1.626 m)  SpO2 94%  LMP 05/17/2009  Physical Exam  Constitutional: She is oriented to person, place, and time. She appears well-developed and well-nourished. No distress.  HENT:  Head: Normocephalic and atraumatic.  Right Ear: Hearing normal.  Left Ear: Hearing normal.  Nose: Nose normal.  Mouth/Throat: Oropharynx is clear and moist and mucous membranes are normal.  Eyes: Conjunctivae and EOM are normal. Pupils are equal, round, and reactive to light.  Neck: Normal range of motion. Neck supple.  Cardiovascular: Regular rhythm, S1 normal and S2 normal.  Exam reveals no gallop and no friction rub.   No murmur heard. Pulmonary/Chest: Effort normal and breath sounds normal. No respiratory distress. She exhibits no tenderness.  Abdominal: Soft. Normal appearance and bowel sounds are normal. There is no hepatosplenomegaly. There is no tenderness. There is no rebound, no  guarding, no tenderness at McBurney's point and negative Murphy's sign. No hernia.  Musculoskeletal: Normal range of motion.  Neurological: She is alert and oriented to person, place, and time. She has normal strength. No cranial nerve deficit or sensory deficit. Coordination normal. GCS eye subscore is 4. GCS verbal subscore is 5. GCS motor subscore is 6.  Skin: Skin is warm, dry and intact. No rash noted. No cyanosis.  Psychiatric: She has a normal mood and affect. Her speech is normal and behavior is normal. Thought content normal.  Nursing note and vitals reviewed.   ED Course  Procedures   DIAGNOSTIC STUDIES: Oxygen Saturation is 94% on RA, normal by my interpretation.  COORDINATION OF CARE: 11:14 PM Discussed treatment plan which includes lab work, CXR, EKG, and pain management with pt at bedside and pt agreed to plan.  Labs Reviewed  BASIC METABOLIC PANEL - Abnormal; Notable for the following:    Glucose, Bld 103 (*)    Creatinine, Ser 1.39 (*)    GFR calc non Af Amer 42 (*)    GFR calc Af Amer 49 (*)    All other components within normal limits  CBC - Abnormal; Notable for the following:    Hemoglobin 16.2 (*)    HCT 50.4 (*)    MCV 100.4 (*)    RDW 17.0 (*)    All other components within normal limits  I-STAT TROPOININ, ED  I-STAT TROPOININ, ED    Imaging Review Dg Chest 2 View  12/12/2014  CLINICAL DATA:  Central chest pain with shortness of breath, cough, emesis, and diaphoresis. Onset this evening. Symptoms present for 3 weeks but now worsening. EXAM: CHEST  2 VIEW COMPARISON:  05/27/2012 FINDINGS: Normal heart size and pulmonary vascularity. No focal airspace disease or consolidation in the lungs. No blunting of costophrenic angles. No pneumothorax. Mediastinal contours appear intact. Postoperative changes in the cervical spine and right upper quadrant. Mild linear scarring in the lung bases. Degenerative changes in the spine. IMPRESSION: No active cardiopulmonary  disease. Electronically Signed   By: Lucienne Capers M.D.   On: 12/12/2014 22:25    I personally reviewed and evaluated these images and lab results as a part of my medical decision-making.   EKG Interpretation  Date/Time:  Monday December 12 2014 21:47:46 EST Ventricular Rate:  93 PR Interval:  160 QRS Duration: 68 QT Interval:  344 QTC Calculation: 427 R Axis:   158 Text Interpretation:   Suspect arm lead reversal, interpretation assumes no reversal Normal sinus rhythm Low voltage QRS Septal infarct , age undetermined Lateral infarct , age undetermined Possible Inferior infarct , age undetermined Abnormal ECG No significant change since last tracing Confirmed by POLLINA  MD, [place] [phone]) on 12/12/2014 11:30:03 PM    MDM   Final diagnoses:  None  chest pain Palpitations bronchitis  Presents to the emergency department for evaluation of chest pain, shortness of breath and cough. Patient reports that she has been experiencing symptoms for 1 day. She reports that she has been experiencing palpitations, sensation of her heart pounding in her chest. This has been present frequently for her in the past. She has had workup by cardiology including stress test that did not show any evidence of cardiac disease. She is followed by Dr. Ellyn Hack. Symptoms today are very atypical for cardiac chest pain. She has had 2 troponins which are negative, it is felt that she is appropriate for outpatient follow-up.  Patient reports shortness of breath and cough in association with her symptoms. It sounds like she is experiencing some anxiety and panic attack causing some of her symptoms. Chest x-ray is clear, no evidence of pneumonia. With the persistent cough, however, consider viral bronchitis.  I personally performed the services described in this documentation, which was scribed in my presence. The recorded information has been reviewed and is accurate.    Candice Greek, MD 12/13/14  [phone]

## 2014-12-12 NOTE — ED Notes (Signed)
Pt. reports central chest pain with SOB , dry cough , emesis and diaphoresis onset this evening .

## 2014-12-13 LAB — I-STAT TROPONIN, ED: Troponin i, poc: 0 ng/mL (ref 0.00–0.08)

## 2014-12-13 MED ORDER — NAPROXEN 500 MG PO TABS: 500.0000 mg | ORAL_TABLET | Freq: Two times a day (BID) | ORAL | Status: DC

## 2014-12-13 MED ORDER — BENZONATATE 100 MG PO CAPS: 100.0000 mg | ORAL_CAPSULE | Freq: Three times a day (TID) | ORAL | Status: DC

## 2014-12-13 NOTE — Discharge Instructions (Signed)
Nonspecific Chest Pain It is often hard to find the cause of chest pain. There is always a chance that your pain could be related to something serious, such as a heart attack or a blood clot in your lungs. Chest pain can also be caused by conditions that are not life-threatening. If you have chest pain, it is very important to follow up with your doctor.  HOME CARE  If you were prescribed an antibiotic medicine, finish it all even if you start to feel better.  Avoid any activities that cause chest pain.  Do not use any tobacco products, including cigarettes, chewing tobacco, or electronic cigarettes. If you need help quitting, ask your doctor.  Do not drink alcohol.  Take medicines only as told by your doctor.  Keep all follow-up visits as told by your doctor. This is important. This includes any further testing if your chest pain does not go away.  Your doctor may tell you to keep your head raised (elevated) while you sleep.  Make lifestyle changes as told by your doctor. These may include:  Getting regular exercise. Ask your doctor to suggest some activities that are safe for you.  Eating a heart-healthy diet. Your doctor or a diet specialist (dietitian) can help you to learn healthy eating options.  Maintaining a healthy weight.  Managing diabetes, if necessary.  Reducing stress. GET HELP IF:  Your chest pain does not go away, even after treatment.  You have a rash with blisters on your chest.  You have a fever. GET HELP RIGHT AWAY IF:  Your chest pain is worse.  You have an increasing cough, or you cough up blood.  You have severe belly (abdominal) pain.  You feel extremely weak.  You pass out (faint).  You have chills.  You have sudden, unexplained chest discomfort.  You have sudden, unexplained discomfort in your arms, back, neck, or jaw.  You have shortness of breath at any time.  You suddenly start to sweat, or your skin gets clammy.  You feel  nauseous.  You vomit.  You suddenly feel light-headed or dizzy.  Your heart begins to beat quickly, or it feels like it is skipping beats. These symptoms may be an emergency. Do not wait to see if the symptoms will go away. Get medical help right away. Call your local emergency services (911 in the [place] Do not drive yourself to the hospital.   This information is not intended to replace advice given to you by your health care provider. Make sure you discuss any questions you have with your health care provider.   Document Released: 06/26/2007 Document Revised: 01/28/2014 Document Reviewed: 08/13/2013 Elsevier Interactive Patient Education 2016 [employer].  Palpitations A palpitation is the feeling that your heartbeat is irregular. It may feel like your heart is fluttering or skipping a beat. It may also feel like your heart is beating faster than normal. This is usually not a serious problem. In some cases, you may need more medical tests. HOME CARE  Avoid:  Caffeine in coffee, tea, soft drinks, diet pills, and energy drinks.  Chocolate.  Alcohol.  Stop smoking if you smoke.  Reduce your stress and anxiety. Try:  A method that measures bodily functions so you can learn to control them (biofeedback).  Yoga.  Meditation.  Physical activity such as swimming, jogging, or walking.  Get plenty of rest and sleep. GET HELP IF:  Your fast or irregular heartbeat continues after 24 hours.  Your palpitations occur  more often. GET HELP RIGHT AWAY IF:   You have chest pain.  You feel short of breath.  You have a very bad headache.  You feel dizzy or pass out (faint). MAKE SURE YOU:   Understand these instructions.  Will watch your condition.  Will get help right away if you are not doing well or get worse.   This information is not intended to replace advice given to you by your health care provider. Make sure you discuss any questions you have with your health  care provider.   Document Released: 10/17/2007 Document Revised: 01/28/2014 Document Reviewed: 03/08/2011 Elsevier Interactive Patient Education [employer].

## 2014-12-16 ENCOUNTER — Emergency Department (HOSPITAL_COMMUNITY)
Admission: EM | Admit: 2014-12-16 | Discharge: 2014-12-16 | Disposition: A | Discharge status: Home / Self Care | Payer: No Typology Code available for payment source | Attending: Emergency Medicine | Admitting: Emergency Medicine

## 2014-12-16 ENCOUNTER — Encounter (HOSPITAL_COMMUNITY): Payer: Self-pay

## 2014-12-16 ENCOUNTER — Emergency Department (HOSPITAL_COMMUNITY): Payer: No Typology Code available for payment source

## 2014-12-16 DIAGNOSIS — I129 Hypertensive chronic kidney disease with stage 1 through stage 4 chronic kidney disease, or unspecified chronic kidney disease: Secondary | ICD-10-CM | POA: Diagnosis not present

## 2014-12-16 DIAGNOSIS — Z79899 Other long term (current) drug therapy: Secondary | ICD-10-CM | POA: Insufficient documentation

## 2014-12-16 DIAGNOSIS — R42 Dizziness and giddiness: Secondary | ICD-10-CM | POA: Diagnosis not present

## 2014-12-16 DIAGNOSIS — G43901 Migraine, unspecified, not intractable, with status migrainosus: Secondary | ICD-10-CM | POA: Insufficient documentation

## 2014-12-16 DIAGNOSIS — Z7982 Long term (current) use of aspirin: Secondary | ICD-10-CM | POA: Diagnosis not present

## 2014-12-16 DIAGNOSIS — H9319 Tinnitus, unspecified ear: Secondary | ICD-10-CM | POA: Insufficient documentation

## 2014-12-16 DIAGNOSIS — M546 Pain in thoracic spine: Secondary | ICD-10-CM | POA: Diagnosis not present

## 2014-12-16 DIAGNOSIS — N183 Chronic kidney disease, stage 3 (moderate): Secondary | ICD-10-CM | POA: Insufficient documentation

## 2014-12-16 DIAGNOSIS — R079 Chest pain, unspecified: Secondary | ICD-10-CM | POA: Diagnosis present

## 2014-12-16 DIAGNOSIS — K219 Gastro-esophageal reflux disease without esophagitis: Secondary | ICD-10-CM | POA: Insufficient documentation

## 2014-12-16 DIAGNOSIS — E039 Hypothyroidism, unspecified: Secondary | ICD-10-CM | POA: Diagnosis not present

## 2014-12-16 DIAGNOSIS — G894 Chronic pain syndrome: Secondary | ICD-10-CM | POA: Insufficient documentation

## 2014-12-16 DIAGNOSIS — R109 Unspecified abdominal pain: Secondary | ICD-10-CM | POA: Diagnosis not present

## 2014-12-16 DIAGNOSIS — F419 Anxiety disorder, unspecified: Secondary | ICD-10-CM | POA: Insufficient documentation

## 2014-12-16 DIAGNOSIS — M1990 Unspecified osteoarthritis, unspecified site: Secondary | ICD-10-CM | POA: Insufficient documentation

## 2014-12-16 DIAGNOSIS — Z91040 Latex allergy status: Secondary | ICD-10-CM | POA: Diagnosis not present

## 2014-12-16 DIAGNOSIS — M542 Cervicalgia: Secondary | ICD-10-CM | POA: Diagnosis not present

## 2014-12-16 DIAGNOSIS — E785 Hyperlipidemia, unspecified: Secondary | ICD-10-CM | POA: Diagnosis not present

## 2014-12-16 DIAGNOSIS — M810 Age-related osteoporosis without current pathological fracture: Secondary | ICD-10-CM | POA: Insufficient documentation

## 2014-12-16 DIAGNOSIS — Z87891 Personal history of nicotine dependence: Secondary | ICD-10-CM | POA: Diagnosis not present

## 2014-12-16 DIAGNOSIS — F329 Major depressive disorder, single episode, unspecified: Secondary | ICD-10-CM | POA: Diagnosis not present

## 2014-12-16 DIAGNOSIS — E669 Obesity, unspecified: Secondary | ICD-10-CM | POA: Insufficient documentation

## 2014-12-16 DIAGNOSIS — R0789 Other chest pain: Secondary | ICD-10-CM | POA: Diagnosis not present

## 2014-12-16 LAB — BASIC METABOLIC PANEL
Anion gap: 8 (ref 5–15)
BUN: 11 mg/dL (ref 6–20)
CHLORIDE: 98 mmol/L — AB (ref 101–111)
CO2: 30 mmol/L (ref 22–32)
CREATININE: 1.68 mg/dL — AB (ref 0.44–1.00)
Calcium: 8.7 mg/dL — ABNORMAL LOW (ref 8.9–10.3)
GFR calc Af Amer: 39 mL/min — ABNORMAL LOW (ref >60)
GFR calc non Af Amer: 33 mL/min — ABNORMAL LOW (ref >60)
Glucose, Bld: 108 mg/dL — ABNORMAL HIGH (ref 65–99)
Potassium: 4 mmol/L (ref 3.5–5.1)
Sodium: 136 mmol/L (ref 135–145)

## 2014-12-16 LAB — I-STAT TROPONIN, ED
Troponin i, poc: 0 ng/mL (ref 0.00–0.08)
Troponin i, poc: 0.01 ng/mL (ref 0.00–0.08)

## 2014-12-16 LAB — CBC
HCT: 46.3 % — ABNORMAL HIGH (ref 36.0–46.0)
Hemoglobin: 15.2 g/dL — ABNORMAL HIGH (ref 12.0–15.0)
MCH: 32.7 pg (ref 26.0–34.0)
MCHC: 32.8 g/dL (ref 30.0–36.0)
MCV: 99.6 fL (ref 78.0–100.0)
PLATELETS: 242 10*3/uL (ref 150–400)
RBC: 4.65 MIL/uL (ref 3.87–5.11)
RDW: 16.6 % — ABNORMAL HIGH (ref 11.5–15.5)
WBC: 9.4 10*3/uL (ref 4.0–10.5)

## 2014-12-16 NOTE — ED Notes (Signed)
Pt here for left upper chest pain onset November 21 and she came here to be seen and was d/c with tessalon capsules and naproxen but she did not get these prescriptions filled; pt here tonight with same complaints. Pt reports: headache, productive cough of "a little bit of Thanksgiving food and phlegm," muscle spasms, dizziness, nausea but no vomiting.

## 2014-12-16 NOTE — ED Notes (Signed)
Pt also states she feels like she is having a panic attack "and I get this way when I feel sick."

## 2014-12-16 NOTE — ED Notes (Signed)
Pt. Left with all belongings. Discharge instructions were reviewed and all questions were answered.  

## 2014-12-16 NOTE — ED Provider Notes (Addendum)
CSN: [id number]     Arrival date & time 12/16/14  0243 History  By signing my name below, I, Hansel Feinstein, attest that this documentation has been prepared under the direction and in the presence of Varney Biles, MD. Electronically Signed: Hansel Feinstein, ED Scribe. 12/16/2014. 3:48 AM.    Chief Complaint  Patient presents with  . Chest Pain  . Anxiety   The history is provided by the patient. No language interpreter was used.    HPI Comments: [name] is a 54 y.o. female with h/o anxiety, GERD, HLD, HTN who presents to the Emergency Department complaining of moderate CP onset this evening with associated difficulty swallowing, SOB, nausea, burning pain in the left arm, upper back and neck pain, generalized muscle spasms, dizziness, flank pain, tinnitus, HA, subjective right-sided numbness (baseline), increased belching. Pt reports that her CP is now improving, but her HA persists. She states that her symptoms are worsened when she lies flat and cause her anxiety. Pt was seen 4 days ago for similar CP and had cardiac workup with no acute findings. Pt is a current smoker, and has decreased from 1 ppd to 0.5 ppd for 2 weeks. She has smoked since age 45. No h/o drug use or alcohol abuse. No h/o MI, DM, TIA. No recent cardiac stress tests. Pt is not medicated for depression or anxiety.   Past Medical History  Diagnosis Date  . Allergy   . Arthritis   . Depression   . GERD (gastroesophageal reflux disease)   . Hyperlipidemia   . Hypertension   . Osteoporosis   . Hypothyroidism   . Anxiety   . Chronic pain syndrome   . Tobacco abuse   . Obesity   . Gastroparesis   . Ankylosing spondylitis ([place])   . Depression   . CKD (chronic kidney disease) stage 3, GFR 30-59 ml/min   . Nephrolithiasis    Past Surgical History  Procedure Laterality Date  . Total knee arthroplasty      left  . Back surgery    . Neck surgery    . Appendectomy    . Cholecystectomy    . Right knee arthroplasty     . Doppler echocardiography  09/26/2010    EF=>55%, LV norm  . Nm myocar perf wall motion  09/26/2010    Lexiscan EF 83%  EF may be overestimated due to LVH   Family History  Problem Relation Age of Onset  . Diabetes Mother   . Heart failure Mother   . Hyperlipidemia Mother   . Hypertension Mother   . Hyperlipidemia Father   . Cancer Son     lymphoma stage 4 at 46   Social History  Substance Use Topics  . Smoking status: Former Smoker -- 1.00 packs/day    Types: Cigarettes    Quit date: 10/12/2010  . Smokeless tobacco: None     Comment: Following her long hospitalization, she never went back to smoking  . Alcohol Use: No   OB History    No data available     Review of Systems  HENT: Positive for tinnitus and trouble swallowing.   Respiratory: Positive for shortness of breath.   Cardiovascular: Positive for chest pain.  Gastrointestinal: Positive for nausea.  Genitourinary: Positive for flank pain.  Musculoskeletal: Positive for myalgias, back pain, arthralgias and neck pain.  Neurological: Positive for dizziness, numbness and headaches.  A complete 10 system review of systems was obtained and all systems are negative except  as noted in the HPI and PMH.    Allergies  Codeine; Latex; and Acyclovir and related  Home Medications   Prior to Admission medications   Medication Sig Start Date End Date Taking? Authorizing Provider  allopurinol (ZYLOPRIM) 100 MG tablet Take 1 tablet (100 mg total) by mouth every morning. 11/22/14  Yes Aquilla Hacker, MD  aspirin EC 81 MG tablet Take 81 mg by mouth daily.     Yes Historical Provider, MD  colchicine 0.6 MG tablet TAKE 1 TABLET BY MOUTH EVERY DAY AS NEEDED Patient taking differently: TAKE 1 TABLET BY MOUTH EVERY DAY AS NEEDED FOR GOUT 10/04/14  Yes Aquilla Hacker, MD  cyclobenzaprine (FLEXERIL) 10 MG tablet Take 10 mg by mouth 3 (three) times daily as needed for muscle spasms.   Yes Historical Provider, MD  gabapentin  (NEURONTIN) 300 MG capsule Take 300 mg by mouth 2 (two) times daily.   Yes Historical Provider, MD  lansoprazole (PREVACID) 30 MG capsule TAKE 1 CAPSULE BY MOUTH EVERY DAY 11/22/14  Yes Aquilla Hacker, MD  levothyroxine (SYNTHROID, LEVOTHROID) 75 MCG tablet Take 1 tablet (75 mcg total) by mouth daily. 11/22/14  Yes Aquilla Hacker, MD  metoprolol succinate (TOPROL-XL) 25 MG 24 hr tablet Take 1 tablet (25 mg total) by mouth daily. Need appointment before anymore refills 02/12/14  Yes Eileen Stanford, PA-C  MOVANTIK 25 MG TABS Take 1 tablet by mouth every other day.  02/10/14  Yes Historical Provider, MD  oxyCODONE 30 MG 12 hr tablet Take 30 mg by mouth daily.   Yes Historical Provider, MD  traZODone (DESYREL) 50 MG tablet TAKE 1/2 TO 1 TABLET BY MOUTH EVERY NIGHT AT BEDTIME AS NEEDED FOR SLEEP 12/13/14  Yes Aquilla Hacker, MD  Vitamin D, Ergocalciferol, (DRISDOL) 50000 UNITS CAPS capsule Take 1 capsule (50,000 Units total) by mouth every 7 (seven) days. 12/01/14  Yes Aquilla Hacker, MD  benzonatate (TESSALON) 100 MG capsule Take 1 capsule (100 mg total) by mouth every 8 (eight) hours. Patient not taking: Reported on 12/16/2014 12/13/14   Orpah Greek, MD  naproxen (NAPROSYN) 500 MG tablet Take 1 tablet (500 mg total) by mouth 2 (two) times daily. Patient not taking: Reported on 12/16/2014 12/13/14   Orpah Greek, MD   BP 136/86 mmHg  Pulse 81  Temp(Src) 98 F (36.7 C) (Oral)  Resp 10  SpO2 100%  LMP 05/17/2009 Physical Exam  Constitutional: She is oriented to person, place, and time. She appears well-developed and well-nourished.  HENT:  Head: Normocephalic and atraumatic.  Eyes: Conjunctivae and EOM are normal. Pupils are equal, round, and reactive to light.  Neck: Normal range of motion. Neck supple.  Cardiovascular: Normal rate, regular rhythm and normal heart sounds.   No murmur heard. 2+ and equal radial pulses bilaterally.   Pulmonary/Chest: Effort normal and  breath sounds normal. No respiratory distress.  Lungs CTA bilaterally.   Abdominal: Soft. She exhibits no distension.  Musculoskeletal: Normal range of motion.  Neurological: She is alert and oriented to person, place, and time. No cranial nerve deficit.  Cranial nerves 2-12 intact. Subjective numbness to the right side of the body (not new for pt). Motor strength of upper and lower extremities appear normal.   Skin: Skin is warm and dry.  Psychiatric: She has a normal mood and affect. Her behavior is normal.  Nursing note and vitals reviewed.  ED Course  Procedures (including critical care time) DIAGNOSTIC STUDIES: Oxygen Saturation  is 95% on RA, adequate by my interpretation.    COORDINATION OF CARE: 3:44 AM Discussed treatment plan with pt at bedside and pt agreed to plan.   Labs Review Labs Reviewed  BASIC METABOLIC PANEL - Abnormal; Notable for the following:    Chloride 98 (*)    Glucose, Bld 108 (*)    Creatinine, Ser 1.68 (*)    Calcium 8.7 (*)    GFR calc non Af Amer 33 (*)    GFR calc Af Amer 39 (*)    All other components within normal limits  CBC - Abnormal; Notable for the following:    Hemoglobin 15.2 (*)    HCT 46.3 (*)    RDW 16.6 (*)    All other components within normal limits  I-STAT TROPOININ, ED  I-STAT TROPOININ, ED    Imaging Review Dg Chest 2 View  12/16/2014  CLINICAL DATA:  Acute onset of left-sided chest pain and left axillary burning sensation. Initial encounter. Chest radiograph performed 12/12/2014 EXAM: CHEST  2 VIEW COMPARISON:  None. FINDINGS: The lungs are well-aerated. Minimal bibasilar scarring is noted. There is no evidence of focal opacification, pleural effusion or pneumothorax. The heart is normal in size; the mediastinal contour is within normal limits. No acute osseous abnormalities are seen. Cervical spinal fusion hardware is partially imaged. Clips are noted within the right upper quadrant, reflecting prior cholecystectomy.  IMPRESSION: No acute cardiopulmonary process seen. Electronically Signed   By: Garald Balding M.D.   On: 12/16/2014 04:12   I have personally reviewed and evaluated these images and lab results as part of my medical decision-making.   EKG Interpretation   Date/Time:  Friday December 16 2014 02:52:54 EST Ventricular Rate:  94 PR Interval:  141 QRS Duration: 77 QT Interval:  349 QTC Calculation: 436 R Axis:   70 Text Interpretation:  Sinus rhythm Anterior infarct, old No significant  change since last tracing Confirmed by Kathrynn Humble, MD, Thelma Comp [phone]) on  12/16/2014 3:14:40 AM      MDM   Final diagnoses:  Chest pain, unspecified chest pain type  Status migrainosus    I personally performed the services described in this documentation, which was scribed in my presence. The recorded information has been reviewed and is accurate.  Pt comes in with cc of chest pain. Chest pain is atypical, diffuse pain - but it has some typical symptoms (moves to the L side in form of spasm pain). Pain started after she gets short of breath and starts having a panic attack. No exertional chest pain. She is a smoker. Vascular exam is normal. ? If there is any anxiety component to her symptoms. Will get trops x 2. Advised pcp f/u if results are normal. I dont think pt is having dissection, PE.  Varney Biles, MD 12/16/14 [id number]     Varney Biles, MD 12/16/14 [id number]

## 2014-12-16 NOTE — Discharge Instructions (Signed)
Migraine Headache A migraine headache is an intense, throbbing pain on one or both sides of your head. A migraine can last for 30 minutes to several hours. CAUSES  The exact cause of a migraine headache is not always known. However, a migraine may be caused when nerves in the brain become irritated and release chemicals that cause inflammation. This causes pain. Certain things may also trigger migraines, such as:  Alcohol.  Smoking.  Stress.  Menstruation.  Aged cheeses.  Foods or drinks that contain nitrates, glutamate, aspartame, or tyramine.  Lack of sleep.  Chocolate.  Caffeine.  Hunger.  Physical exertion.  Fatigue.  Medicines used to treat chest pain (nitroglycerine), birth control pills, estrogen, and some blood pressure medicines. SIGNS AND SYMPTOMS  Pain on one or both sides of your head.  Pulsating or throbbing pain.  Severe pain that prevents daily activities.  Pain that is aggravated by any physical activity.  Nausea, vomiting, or both.  Dizziness.  Pain with exposure to bright lights, loud noises, or activity.  General sensitivity to bright lights, loud noises, or smells. Before you get a migraine, you may get warning signs that a migraine is coming (aura). An aura may include:  Seeing flashing lights.  Seeing bright spots, halos, or zigzag lines.  Having tunnel vision or blurred vision.  Having feelings of numbness or tingling.  Having trouble talking.  Having muscle weakness. DIAGNOSIS  A migraine headache is often diagnosed based on:  Symptoms.  Physical exam.  A CT scan or MRI of your head. These imaging tests cannot diagnose migraines, but they can help rule out other causes of headaches. TREATMENT Medicines may be given for pain and nausea. Medicines can also be given to help prevent recurrent migraines.  HOME CARE INSTRUCTIONS  Only take over-the-counter or prescription medicines for pain or discomfort as directed by your  health care provider. The use of long-term narcotics is not recommended.  Lie down in a dark, quiet room when you have a migraine.  Keep a journal to find out what may trigger your migraine headaches. For example, write down:  What you eat and drink.  How much sleep you get.  Any change to your diet or medicines.  Limit alcohol consumption.  Quit smoking if you smoke.  Get 7-9 hours of sleep, or as recommended by your health care provider.  Limit stress.  Keep lights dim if bright lights bother you and make your migraines worse. SEEK IMMEDIATE MEDICAL CARE IF:   Your migraine becomes severe.  You have a fever.  You have a stiff neck.  You have vision loss.  You have muscular weakness or loss of muscle control.  You start losing your balance or have trouble walking.  You feel faint or pass out.  You have severe symptoms that are different from your first symptoms. MAKE SURE YOU:   Understand these instructions.  Will watch your condition.  Will get help right away if you are not doing well or get worse.   This information is not intended to replace advice given to you by your health care provider. Make sure you discuss any questions you have with your health care provider.   Document Released: 01/07/2005 Document Revised: 01/28/2014 Document Reviewed: 09/14/2012 Elsevier Interactive Patient Education 2016 [employer]  Nonspecific Chest Pain  Chest pain can be caused by many different conditions. There is always a chance that your pain could be related to something serious, such as a heart attack or a  blood clot in your lungs. Chest pain can also be caused by conditions that are not life-threatening. If you have chest pain, it is very important to follow up with your health care provider. CAUSES  Chest pain can be caused by:  Heartburn.  Pneumonia or bronchitis.  Anxiety or stress.  Inflammation around your heart (pericarditis) or lung (pleuritis or  pleurisy).  A blood clot in your lung.  A collapsed lung (pneumothorax). It can develop suddenly on its own (spontaneous pneumothorax) or from trauma to the chest.  Shingles infection (varicella-zoster virus).  Heart attack.  Damage to the bones, muscles, and cartilage that make up your chest wall. This can include:  Bruised bones due to injury.  Strained muscles or cartilage due to frequent or repeated coughing or overwork.  Fracture to one or more ribs.  Sore cartilage due to inflammation (costochondritis). RISK FACTORS  Risk factors for chest pain may include:  Activities that increase your risk for trauma or injury to your chest.  Respiratory infections or conditions that cause frequent coughing.  Medical conditions or overeating that can cause heartburn.  Heart disease or family history of heart disease.  Conditions or health behaviors that increase your risk of developing a blood clot.  Having had chicken pox (varicella zoster). SIGNS AND SYMPTOMS Chest pain can feel like:  Burning or tingling on the surface of your chest or deep in your chest.  Crushing, pressure, aching, or squeezing pain.  Dull or sharp pain that is worse when you move, cough, or take a deep breath.  Pain that is also felt in your back, neck, shoulder, or arm, or pain that spreads to any of these areas. Your chest pain may come and go, or it may stay constant. DIAGNOSIS Lab tests or other studies may be needed to find the cause of your pain. Your health care provider may have you take a test called an ambulatory ECG (electrocardiogram). An ECG records your heartbeat patterns at the time the test is performed. You may also have other tests, such as:  Transthoracic echocardiogram (TTE). During echocardiography, sound waves are used to create a picture of all of the heart structures and to look at how blood flows through your heart.  Transesophageal echocardiogram (TEE).This is a more advanced  imaging test that obtains images from inside your body. It allows your health care provider to see your heart in finer detail.  Cardiac monitoring. This allows your health care provider to monitor your heart rate and rhythm in real time.  Holter monitor. This is a portable device that records your heartbeat and can help to diagnose abnormal heartbeats. It allows your health care provider to track your heart activity for several days, if needed.  Stress tests. These can be done through exercise or by taking medicine that makes your heart beat more quickly.  Blood tests.  Imaging tests. TREATMENT  Your treatment depends on what is causing your chest pain. Treatment may include:  Medicines. These may include:  Acid blockers for heartburn.  Anti-inflammatory medicine.  Pain medicine for inflammatory conditions.  Antibiotic medicine, if an infection is present.  Medicines to dissolve blood clots.  Medicines to treat coronary artery disease.  Supportive care for conditions that do not require medicines. This may include:  Resting.  Applying heat or cold packs to injured areas.  Limiting activities until pain decreases. HOME CARE INSTRUCTIONS  If you were prescribed an antibiotic medicine, finish it all even if you start to feel better.  Avoid any activities that bring on chest pain.  Do not use any tobacco products, including cigarettes, chewing tobacco, or electronic cigarettes. If you need help quitting, ask your health care provider.  Do not drink alcohol.  Take medicines only as directed by your health care provider.  Keep all follow-up visits as directed by your health care provider. This is important. This includes any further testing if your chest pain does not go away.  If heartburn is the cause for your chest pain, you may be told to keep your head raised (elevated) while sleeping. This reduces the chance that acid will go from your stomach into your  esophagus.  Make lifestyle changes as directed by your health care provider. These may include:  Getting regular exercise. Ask your health care provider to suggest some activities that are safe for you.  Eating a heart-healthy diet. A registered dietitian can help you to learn healthy eating options.  Maintaining a healthy weight.  Managing diabetes, if necessary.  Reducing stress. SEEK MEDICAL CARE IF:  Your chest pain does not go away after treatment.  You have a rash with blisters on your chest.  You have a fever. SEEK IMMEDIATE MEDICAL CARE IF:   Your chest pain is worse.  You have an increasing cough, or you cough up blood.  You have severe abdominal pain.  You have severe weakness.  You faint.  You have chills.  You have sudden, unexplained chest discomfort.  You have sudden, unexplained discomfort in your arms, back, neck, or jaw.  You have shortness of breath at any time.  You suddenly start to sweat, or your skin gets clammy.  You feel nauseous or you vomit.  You suddenly feel light-headed or dizzy.  Your heart begins to beat quickly, or it feels like it is skipping beats. These symptoms may represent a serious problem that is an emergency. Do not wait to see if the symptoms will go away. Get medical help right away. Call your local emergency services (911 in the [place] Do not drive yourself to the hospital.   This information is not intended to replace advice given to you by your health care provider. Make sure you discuss any questions you have with your health care provider.   Document Released: 10/17/2004 Document Revised: 01/28/2014 Document Reviewed: 08/13/2013 Elsevier Interactive Patient Education [employer].

## 2014-12-19 ENCOUNTER — Telehealth: Payer: Self-pay | Admitting: Family Medicine

## 2014-12-19 ENCOUNTER — Telehealth: Payer: Self-pay | Admitting: Cardiology

## 2014-12-19 NOTE — Telephone Encounter (Signed)
Will forward to MD. Jazmin Hartsell,CMA  

## 2014-12-19 NOTE — Telephone Encounter (Signed)
Pt called because she has an appointment for the 14 th. She has been to the ER twice in the past couple of days and they really want her in to see the doctor now. The first available  is the 7 th. She would like to see the doctor this week. The doctor is hear on 12/1/ and 12/2 but is full. She is wanting to know if the doctor can squeeze her in. jw

## 2014-12-19 NOTE — Telephone Encounter (Signed)
Closed encounter °

## 2014-12-21 ENCOUNTER — Ambulatory Visit (INDEPENDENT_AMBULATORY_CARE_PROVIDER_SITE_OTHER): Payer: No Typology Code available for payment source | Admitting: Physician Assistant

## 2014-12-21 ENCOUNTER — Encounter: Payer: Self-pay | Admitting: Physician Assistant

## 2014-12-21 ENCOUNTER — Ambulatory Visit (INDEPENDENT_AMBULATORY_CARE_PROVIDER_SITE_OTHER): Payer: No Typology Code available for payment source

## 2014-12-21 VITALS — BP 118/76 | HR 91 | Ht 64.0 in | Wt 215.7 lb

## 2014-12-21 DIAGNOSIS — R000 Tachycardia, unspecified: Secondary | ICD-10-CM | POA: Diagnosis not present

## 2014-12-21 DIAGNOSIS — Z79899 Other long term (current) drug therapy: Secondary | ICD-10-CM

## 2014-12-21 DIAGNOSIS — R002 Palpitations: Secondary | ICD-10-CM | POA: Diagnosis not present

## 2014-12-21 DIAGNOSIS — G4730 Sleep apnea, unspecified: Secondary | ICD-10-CM | POA: Diagnosis not present

## 2014-12-21 DIAGNOSIS — R072 Precordial pain: Secondary | ICD-10-CM

## 2014-12-21 MED ORDER — METOPROLOL SUCCINATE ER 25 MG PO TB24: 25.0000 mg | ORAL_TABLET | Freq: Two times a day (BID) | ORAL | Status: DC

## 2014-12-21 NOTE — Progress Notes (Signed)
Cardiology Office Note   Date:  12/21/2014   ID:  [name], DOB May 21, 1960, MRN [id number]  PCP:  Candice Compton, MD  Cardiologist:  Dr Nolon Stalls, PA-C   Chief Complaint  Patient presents with  . Tachycardia    feel like heart is pounding out of chest x 3 weeks  . Shortness of Breath    History of Present Illness: [name] is a 54 y.o. female with a history of RA, ankylosing spondylitis w/ scoliosis, HL, palpitations, chest pain (prev echo and MV 2012 OK).  ER visits for chest pain 11/21 and 11/25  [name] presents for evaluation of chest pain, palpitations and dyspnea on exertion.  Ms. [name] has had chest pain for approximately 2 months. The pain is left-sided and is a pressure as well as some tender areas. The range of the pain is between a 2 or 3/10 and an 8/10. She has been to the ER twice for this with her heart pounding also. According to the patient, she noted her heart rate to be 181 while being on the monitor but this is not documented on any strips, an ECG, or in the progress notes.  The pain is associated with shortness of breath. Additionally, she had nausea and vomiting once prior to one of her emergency room visits. There is no diaphoresis. The pain will radiate to her left arm and hand. She also gets a bit of a choking feeling.   She will feel her heart pounding, which is worse with the chest pain. Her heart is pounding a little bit right now, with a heart rate in the 90s. She does not have a home blood pressure cuff so has not been able to check her blood pressure when she feels her heart pounding very hard. Her blood pressure has been elevated when it has been checked, in the 160s at times.  She also has back pain but has fibromyalgia and other musculoskeletal issues, so she has a lot of skeletal pain at baseline. Recently, she has also noted hand cramping  During one of her ER visits, she stated that when she was given medication  that made her sleepy, her oxygen levels were dropping. She states she has been told that she has sleep apnea in the past but has never had a test for it. She does wake up sometimes with her heart pounding.   Past Medical History  Diagnosis Date  . Allergy   . Arthritis   . Depression   . GERD (gastroesophageal reflux disease)   . Hyperlipidemia   . Hypertension   . Osteoporosis   . Hypothyroidism   . Anxiety   . Chronic pain syndrome   . Tobacco abuse   . Obesity   . Gastroparesis   . Ankylosing spondylitis ([place])   . Depression   . CKD (chronic kidney disease) stage 3, GFR 30-59 ml/min   . Nephrolithiasis     Past Surgical History  Procedure Laterality Date  . Total knee arthroplasty      left  . Back surgery    . Neck surgery    . Appendectomy    . Cholecystectomy    . Right knee arthroplasty    . Doppler echocardiography  09/26/2010    EF=>55%, LV norm  . Nm myocar perf wall motion  09/26/2010    Lexiscan EF 83%  EF may be overestimated due to LVH    Current Outpatient Prescriptions  Medication Sig Dispense Refill  . allopurinol (ZYLOPRIM) 100 MG tablet Take 1 tablet (100 mg total) by mouth every morning. 60 tablet 5  . aspirin EC 81 MG tablet Take 81 mg by mouth daily.      . benzonatate (TESSALON) 100 MG capsule Take 1 capsule (100 mg total) by mouth every 8 (eight) hours. (Patient not taking: Reported on 12/16/2014) 21 capsule 0  . colchicine 0.6 MG tablet TAKE 1 TABLET BY MOUTH EVERY DAY AS NEEDED (Patient taking differently: TAKE 1 TABLET BY MOUTH EVERY DAY AS NEEDED FOR GOUT) 30 tablet 0  . cyclobenzaprine (FLEXERIL) 10 MG tablet Take 10 mg by mouth 3 (three) times daily as needed for muscle spasms.    Marland Kitchen gabapentin (NEURONTIN) 300 MG capsule Take 300 mg by mouth 2 (two) times daily.    . lansoprazole (PREVACID) 30 MG capsule TAKE 1 CAPSULE BY MOUTH EVERY DAY 30 capsule 0  . levothyroxine (SYNTHROID, LEVOTHROID) 75 MCG tablet Take 1 tablet (75 mcg total) by  mouth daily. 90 tablet 4  . metoprolol succinate (TOPROL-XL) 25 MG 24 hr tablet Take 1 tablet (25 mg total) by mouth daily. Need appointment before anymore refills 30 tablet 11  . MOVANTIK 25 MG TABS Take 1 tablet by mouth every other day.   0  . naproxen (NAPROSYN) 500 MG tablet Take 1 tablet (500 mg total) by mouth 2 (two) times daily. (Patient not taking: Reported on 12/16/2014) 30 tablet 0  . oxyCODONE 30 MG 12 hr tablet Take 30 mg by mouth daily.    . traZODone (DESYREL) 50 MG tablet TAKE 1/2 TO 1 TABLET BY MOUTH EVERY NIGHT AT BEDTIME AS NEEDED FOR SLEEP 30 tablet 0  . Vitamin D, Ergocalciferol, (DRISDOL) 50000 UNITS CAPS capsule Take 1 capsule (50,000 Units total) by mouth every 7 (seven) days. 8 capsule 3   No current facility-administered medications for this visit.    Allergies:   Codeine; Latex; and Acyclovir and related    Social History:  The patient  reports that she quit smoking about 4 years ago. Her smoking use included Cigarettes. She smoked 1.00 pack per day. She does not have any smokeless tobacco history on file. She reports that she does not drink alcohol or use illicit drugs.   Family History:  The patient's family history includes Cancer in her son; Diabetes in her mother; Heart failure in her mother; Hyperlipidemia in her father and mother; Hypertension in her mother.    ROS:  Please see the history of present illness. All other systems are reviewed and negative.    PHYSICAL EXAM: VS:  BP 118/76 mmHg  Pulse 91  Ht 5\' 4"  (1.626 m)  Wt 215 lb 11.2 oz (97.841 kg)  BMI 37.01 kg/m2  LMP 05/17/2009 , BMI Body mass index is 37.01 kg/(m^2). GEN: Well nourished, well developed, female in no acute distress HEENT: normal for age  Neck: no JVD, no carotid bruit, no masses Cardiac: RRR; soft murmur, no rubs, or gallops Respiratory:  clear to auscultation bilaterally, normal work of breathing; chest wall tenderness to the right of the sternum is noted GI: soft, nontender,  nondistended, + BS MS: no deformity or atrophy; no edema; distal pulses are 2+ in all 4 extremities  Skin: warm and dry, no rash Neuro:  Strength and sensation are intact Psych: euthymic mood, full affect   EKG:  EKG is ordered today. The ekg ordered today demonstrates sinus rhythm, rate 93, no acute ischemic changes  Recent Labs: 02/07/2014: TSH 2.937 12/16/2014: BUN 11; Creatinine, Ser 1.68*; Hemoglobin 15.2*; Platelets 242; Potassium 4.0; Sodium 136    Lipid Panel    Component Value Date/Time   CHOL 151 12/03/2012 1729   TRIG 209* 12/03/2012 1729   HDL 40 12/03/2012 1729   CHOLHDL 3.8 12/03/2012 1729   VLDL 42* 12/03/2012 1729   LDLCALC 69 12/03/2012 1729   LDLDIRECT 174* 09/06/2013 0950     Wt Readings from Last 3 Encounters:  12/21/14 215 lb 11.2 oz (97.841 kg)  11/22/14 208 lb (94.348 kg)  09/23/14 201 lb 6.4 oz (91.354 kg)     Other studies Reviewed: Additional studies/ records that were reviewed today include: Hospital records, ECGs and office notes.  ASSESSMENT AND PLAN:  1.  Chest pain: Discussed the aspects of her chest pain that are consistent versus non-consistent with angina. I assured her that I felt confident she had not had a heart attack. I advised her that she has multiple cardiac risk factors and I would be happy to perform a stress test. I also advised her that I did not feel strongly about stress test because she has been having pain for 2 months with no EKG changes and no elevation in her enzymes. The patient prefers to hold off on a stress test for now.  2. Palpitations: These have never been caught on telemetry. She had some sinus tachycardia during her ER visits but no heart rates greater than 110 are recorded and no ECG or strips show a significantly elevated heart rate. She is encouraged to get a home blood pressure cuff and check her blood pressure and heart rate whenever she feels her heart pounding. We will order a 30 day event monitor to try to  catch the arrhythmia when it occurs. As her blood pressure has been elevated recently, we will increase her beta blocker to twice a day. If she does not tolerate this she is to let us know.  3. Hand cramping: We will get a BMET and a magnesium level today.  4. Possible sleep apnea: This may account for some of her palpitations. We will order a sleep study.  5. Nasal congestion: She is told that she can take Mucinex or Robitussin-DM   Current medicines are reviewed at length with the patient today.  The patient does not have concerns regarding medicines.  The following changes have been made:  Increase metoprolol to twice a day  Labs/ tests ordered today include:   Orders Placed This Encounter  Procedures  . Basic Metabolic Panel (BMET)  . Magnesium  . Cardiac event monitor  . EKG 12-Lead  . Split night study     Disposition:   FU with Dr. Ellyn Hack her me after the event monitor  Signed, Lenoard Aden  12/21/2014 3:12 PM    [address], [place], [place]  [address] Phone: [phone]; Fax: [phone]

## 2014-12-21 NOTE — Patient Instructions (Addendum)
Your physician recommends that you schedule a follow-up appointment in: [place] physician recommends that you return for lab work in: [facility] and Magnesium  Your physician has recommended that you have a sleep study. This test records several body functions during sleep, including: brain activity, eye movement, oxygen and carbon dioxide blood levels, heart rate and rhythm, breathing rate and rhythm, the flow of air through your mouth and nose, snoring, body muscle movements, and chest and belly movement.  Your physician has recommended you make the following change in your medication: Increase Metoprolol 25 mg twice a day  OK to take Mucinex or Robitussin DM over the counter

## 2014-12-22 LAB — BASIC METABOLIC PANEL
BUN: 21 mg/dL (ref 7–25)
CALCIUM: 8.7 mg/dL (ref 8.6–10.4)
CHLORIDE: 99 mmol/L (ref 98–110)
CO2: 34 mmol/L — AB (ref 20–31)
Creat: 1.25 mg/dL — ABNORMAL HIGH (ref 0.50–1.05)
Glucose, Bld: 88 mg/dL (ref 65–99)
POTASSIUM: 4.8 mmol/L (ref 3.5–5.3)
SODIUM: 139 mmol/L (ref 135–146)

## 2014-12-22 LAB — MAGNESIUM: Magnesium: 1.9 mg/dL (ref 1.5–2.5)

## 2014-12-22 NOTE — Telephone Encounter (Signed)
LM for patient to call back on identified VM asking her to let us know if there is more pressing concerns.  Taia Bramlett,CMA

## 2014-12-22 NOTE — Telephone Encounter (Signed)
Could we clarify why she needs to be seen earlier? I'm happy to see her on the 7th if she has a pressing concern. Thanks  CGM MD

## 2014-12-23 ENCOUNTER — Telehealth: Payer: Self-pay | Admitting: *Deleted

## 2014-12-23 NOTE — Telephone Encounter (Signed)
Patient states she is having sinus drainage which feels like it is hanging onto her Uvula. Patient complains of SOB from the drainage. Patient is also wearing a heart monitor for 30 days. Patient would like to be worked in on 12/28/14. Patient states if she does not have something to help clear the drainage she will return to the ER. Please contact patient with work in time and date if the Doctor warrants moving up the appointment.

## 2014-12-23 NOTE — Telephone Encounter (Signed)
ERROR

## 2014-12-23 NOTE — Telephone Encounter (Signed)
Will forward to MD. Jazmin Hartsell,CMA  

## 2014-12-26 ENCOUNTER — Other Ambulatory Visit: Payer: Self-pay | Admitting: Family Medicine

## 2014-12-26 NOTE — Telephone Encounter (Signed)
I am happy to see her on 12/7. Thanks for your help.   CGM MD

## 2014-12-26 NOTE — Telephone Encounter (Signed)
What time would you like to see her on the 7th? Chanse Kagel,CMA

## 2014-12-27 NOTE — Telephone Encounter (Signed)
Patient is aware of appt. Jazmin Hartsell,CMA

## 2014-12-27 NOTE — Telephone Encounter (Signed)
Early afternoon if possible. Thanks  CGM MD

## 2014-12-28 ENCOUNTER — Ambulatory Visit (INDEPENDENT_AMBULATORY_CARE_PROVIDER_SITE_OTHER): Payer: No Typology Code available for payment source | Admitting: Family Medicine

## 2014-12-28 ENCOUNTER — Encounter: Payer: Self-pay | Admitting: Family Medicine

## 2014-12-28 VITALS — BP 120/85 | HR 92 | Temp 98.0°F | Wt 214.0 lb

## 2014-12-28 DIAGNOSIS — J0110 Acute frontal sinusitis, unspecified: Secondary | ICD-10-CM

## 2014-12-28 MED ORDER — AMOXICILLIN-POT CLAVULANATE 875-125 MG PO TABS: 1.0000 | ORAL_TABLET | Freq: Two times a day (BID) | ORAL | Status: DC

## 2014-12-28 MED ORDER — DM-GUAIFENESIN ER 30-600 MG PO TB12: 1.0000 | ORAL_TABLET | Freq: Two times a day (BID) | ORAL | Status: DC

## 2014-12-28 NOTE — Patient Instructions (Signed)
Thanks for coming in today.   Take the Mucinex, Naproxen (that you were prescribed from the ER), and the Tessalon perles as you have been prescribed in the past.   I am also prescribing an antibiotic for you to take for 10 days.  We will see you back next week.   If you experience an anxiety attack, work on focusing on calming down and taking deep breaths.   If you continue to experience anxiety, then we will work on treating you at that time.   We will see you next week!  Thanks for letting us take care of you.  Sincerely,  Paula Compton, MD Family Medicine - PGY 2

## 2014-12-29 NOTE — Progress Notes (Signed)
Patient ID: [name], female   DOB: 1960/09/12, 54 y.o.   MRN: [id number]   [facility] Aquilla Hacker, MD Phone: [phone]  Subjective:   # Sinus congestion - Pt. Comes today as a work in because she says she has been having sinus congestion, mucus production, and phlegm that have caused her to have episodes of difficulty breathing giving her a panic attack and sending her to the ER two times in the past week.  - She says that she gets thick phlegm in her throat at night mostly when laying down to sleep, she has difficulty clearing it, and will begin to cough. She then feels like she can't breathe and gets very anxious and "panicky". She says that causes her heart to race, and chest pain, and arm numbness / tingling to start.  - these symptoms made her feel that she was having a heart attack, and so she went to the ED, where she was reassured and treated for her symptoms.  - She has been taking tessalon perles given to her by the ED which she says have helped.  - She was taking Naproxen.  - She has not been taking anything for congestion as sudafed is contraindicated due to her heart condition, and she did not know of anything else to take.  - she denies fever, chills, sinus pain, SOB, chest pain, DOE, or chest pain on exertion.  - Symptoms ongoing for 1-2 weeks. Mild congestion, cough with clear phlegm production.  - She continues to smoke 1ppd.   All relevant systems were reviewed and were negative unless otherwise noted in the HPI  Past Medical History Reviewed problem list.  Medications- reviewed and updated Current Outpatient Prescriptions  Medication Sig Dispense Refill  . allopurinol (ZYLOPRIM) 100 MG tablet Take 1 tablet (100 mg total) by mouth every morning. 60 tablet 5  . amoxicillin-clavulanate (AUGMENTIN) 875-125 MG tablet Take 1 tablet by mouth 2 (two) times daily. 20 tablet 0  . aspirin EC 81 MG tablet Take 81 mg by mouth daily.      .  benzonatate (TESSALON) 100 MG capsule Take 1 capsule (100 mg total) by mouth every 8 (eight) hours. 21 capsule 0  . colchicine 0.6 MG tablet TAKE 1 TABLET BY MOUTH EVERY DAY AS NEEDED (Patient taking differently: TAKE 1 TABLET BY MOUTH EVERY DAY AS NEEDED FOR GOUT) 30 tablet 0  . cyclobenzaprine (FLEXERIL) 10 MG tablet Take 10 mg by mouth 2 (two) times daily.     Marland Kitchen dextromethorphan-guaiFENesin (MUCINEX DM) 30-600 MG 12hr tablet Take 1 tablet by mouth 2 (two) times daily. 30 tablet 0  . gabapentin (NEURONTIN) 300 MG capsule Take 300 mg by mouth 2 (two) times daily.    . lansoprazole (PREVACID) 30 MG capsule TAKE 1 CAPSULE BY MOUTH EVERY DAY 90 capsule 0  . levothyroxine (SYNTHROID, LEVOTHROID) 75 MCG tablet Take 1 tablet (75 mcg total) by mouth daily. 90 tablet 4  . metoprolol succinate (TOPROL-XL) 25 MG 24 hr tablet Take 1 tablet (25 mg total) by mouth 2 (two) times daily. 60 tablet 11  . MOVANTIK 25 MG TABS Take 1 tablet by mouth every other day.   0  . naproxen (NAPROSYN) 500 MG tablet Take 1 tablet (500 mg total) by mouth 2 (two) times daily. 30 tablet 0  . oxyCODONE 30 MG 12 hr tablet Take 30 mg by mouth daily.    . traZODone (DESYREL) 50 MG tablet TAKE 1/2 TO 1  TABLET BY MOUTH EVERY NIGHT AT BEDTIME AS NEEDED FOR SLEEP 30 tablet 0  . Vitamin D, Ergocalciferol, (DRISDOL) 50000 UNITS CAPS capsule Take 1 capsule (50,000 Units total) by mouth every 7 (seven) days. 8 capsule 3   No current facility-administered medications for this visit.   Chief complaint-noted No additions to family history Social history- patient is a 1ppd.  smoker  Objective: BP 120/85 mmHg  Pulse 92  Temp(Src) 98 F (36.7 C) (Oral)  Wt 214 lb (97.07 kg)  LMP 05/17/2009 Gen: NAD, alert, cooperative with exam HEENT: NCAT, EOMI, PERRL, TMs nml, no sinus pain, no LAD, no o/p erythema, no exudates on tonsils.  Neck: FROM, supple, no LAD CV: RRR, good S1/S2, no murmur Resp: CTABL, no wheezes, non-labored Abd: SNTND, BS  present, no guarding or organomegaly Ext: No edema, warm, normal tone, moves UE/LE spontaneously Neuro: Alert and oriented, No gross deficits Skin: no rashes no lesions  Assessment/Plan:  # Congestion - pt. With congestion. ? Apparently causing her to go to the ED due to panic episodes.  - Recommend mucinex to thin sputum.  - Naproxen for pain / discomfort - Drink plenty of water - Course of Augmentin given her risk factors, and length of illness making this a possible atypical upper respiratory bacterial infection. Hopefully we can avoid further ED visits.  - Return in one week for ongoing follow up.  - Return precautions reviewed for her to be seen earlier if worsening, chest pain / pressure / dyspnea develop.  - pt. To get sleep study in the near future. Ordered by her cardiologist.

## 2015-01-02 ENCOUNTER — Other Ambulatory Visit: Payer: Self-pay | Admitting: Family Medicine

## 2015-01-04 ENCOUNTER — Encounter: Payer: Self-pay | Admitting: Family Medicine

## 2015-01-04 ENCOUNTER — Ambulatory Visit (INDEPENDENT_AMBULATORY_CARE_PROVIDER_SITE_OTHER): Payer: No Typology Code available for payment source | Admitting: Family Medicine

## 2015-01-04 VITALS — BP 107/75 | HR 91 | Temp 98.2°F | Wt 211.2 lb

## 2015-01-04 DIAGNOSIS — F430 Acute stress reaction: Principal | ICD-10-CM

## 2015-01-04 DIAGNOSIS — E039 Hypothyroidism, unspecified: Secondary | ICD-10-CM

## 2015-01-04 DIAGNOSIS — F410 Panic disorder [episodic paroxysmal anxiety] without agoraphobia: Secondary | ICD-10-CM | POA: Diagnosis not present

## 2015-01-04 MED ORDER — LORAZEPAM 0.5 MG PO TABS: 0.5000 mg | ORAL_TABLET | Freq: Three times a day (TID) | ORAL | Status: DC | PRN

## 2015-01-04 NOTE — Patient Instructions (Signed)
Thanks for coming in today.   We will start you on the Ativan medication. You can take this every eight hours as needed for panic attacks.   After you are seen by the cardiologist, we will begin to evaluate and treat your anxiety.   Return for follow up for Anxiety and smoking cessation in 6 weeks.   If you develop chest pain, palpitations, or shortness of breath despite the ativan, then go to the ED.   Thanks for letting us take care of you.   Sincerely,  Paula Compton, MD Family Medicine - PGY 2

## 2015-01-05 NOTE — Progress Notes (Signed)
Patient ID: [name], female   DOB: [birth year], 54 y.o.   MRN: [id number]   [facility] Aquilla Hacker, MD Phone: [phone]  Subjective:   # Panic Attacks - Pt. Presents today due to continued anxiety and panic attacks. She has had a previous diagnosis of GAD for which she was treated with Xanax and Citalopram.  - She has since discontinued these medications and had been off of them for around 4 years.  - she says that recently she has had to go to the ED multiple times  - they are not associated / triggered by any specific situation.  - they come on suddenly.  - she has actually been wearing a holter monitor because it is thought that some of her symptoms may be due to palpitations that are triggering her anxiety symptoms.  - she says that whenever she has the symptoms she has to take off the holter monitor because she cannot have anything around her neck.  - she says that she needs a bridging therapy to be able to make it through the next few weeks until she can follow up with her cardiologist and we can see if this is a cardiac issue or primarily anxiety issue.   All relevant systems were reviewed and were negative unless otherwise noted in the HPI  Past Medical History Reviewed problem list.  Medications- reviewed and updated Current Outpatient Prescriptions  Medication Sig Dispense Refill  . allopurinol (ZYLOPRIM) 100 MG tablet Take 1 tablet (100 mg total) by mouth every morning. 60 tablet 5  . amoxicillin-clavulanate (AUGMENTIN) 875-125 MG tablet Take 1 tablet by mouth 2 (two) times daily. 20 tablet 0  . aspirin EC 81 MG tablet Take 81 mg by mouth daily.      . benzonatate (TESSALON) 100 MG capsule Take 1 capsule (100 mg total) by mouth every 8 (eight) hours. 21 capsule 0  . colchicine 0.6 MG tablet TAKE 1 TABLET BY MOUTH EVERY DAY AS NEEDED 30 tablet 0  . colchicine 0.6 MG tablet TAKE 1 TABLET BY MOUTH EVERY DAY AS NEEDED 30 tablet 0  .  cyclobenzaprine (FLEXERIL) 10 MG tablet Take 10 mg by mouth 2 (two) times daily.     Marland Kitchen dextromethorphan-guaiFENesin (MUCINEX DM) 30-600 MG 12hr tablet Take 1 tablet by mouth 2 (two) times daily. 30 tablet 0  . gabapentin (NEURONTIN) 300 MG capsule Take 300 mg by mouth 2 (two) times daily.    . lansoprazole (PREVACID) 30 MG capsule TAKE 1 CAPSULE BY MOUTH EVERY DAY 90 capsule 0  . levothyroxine (SYNTHROID, LEVOTHROID) 75 MCG tablet Take 1 tablet (75 mcg total) by mouth daily. 90 tablet 4  . LORazepam (ATIVAN) 0.5 MG tablet Take 1 tablet (0.5 mg total) by mouth every 8 (eight) hours as needed for anxiety. 120 tablet 0  . metoprolol succinate (TOPROL-XL) 25 MG 24 hr tablet Take 1 tablet (25 mg total) by mouth 2 (two) times daily. 60 tablet 11  . MOVANTIK 25 MG TABS Take 1 tablet by mouth every other day.   0  . naproxen (NAPROSYN) 500 MG tablet Take 1 tablet (500 mg total) by mouth 2 (two) times daily. 30 tablet 0  . oxyCODONE 30 MG 12 hr tablet Take 30 mg by mouth daily.    . traZODone (DESYREL) 50 MG tablet TAKE 1/2 TO 1 TABLET BY MOUTH EVERY NIGHT AT BEDTIME AS NEEDED FOR SLEEP 30 tablet 0  . Vitamin D, Ergocalciferol, (DRISDOL) 50000 UNITS  CAPS capsule Take 1 capsule (50,000 Units total) by mouth every 7 (seven) days. 8 capsule 3   No current facility-administered medications for this visit.   Chief complaint-noted No additions to family history Social history- patient is a current 1-2ppd smoker  Objective: BP 107/75 mmHg  Pulse 91  Temp(Src) 98.2 F (36.8 C) (Oral)  Wt 211 lb 3.2 oz (95.8 kg)  LMP 05/17/2009 Gen: NAD, alert, cooperative with exam HEENT: NCAT, EOMI, PERRL Neck: FROM, supple CV: RRR, good S1/S2, no murmur Resp: CTABL, no wheezes, non-labored Abd: SNTND, BS present, no guarding or organomegaly Ext: No edema, warm, normal tone, moves UE/LE spontaneously Neuro: Alert and oriented, No gross deficits Skin: no rashes no lesions  Assessment/Plan:  # GAD/ Panic Attacks  - pt. With unclear etiology of her anxiety / panic attacks. She has carried this diagnosis and has not required treatment for some time. Possible cardiac etiology.  - will treat her with xanax prn for now.  - pt. Understands that this is intended to be a short term / bridging measure.  - this should get her through until we can evaluate the holter readings.  - we will then attempt to control her anxiety with longer term / baseline treatment medications.  - will have her follow up in 6 weeks.

## 2015-01-08 ENCOUNTER — Encounter (HOSPITAL_COMMUNITY): Payer: Self-pay | Admitting: Emergency Medicine

## 2015-01-08 ENCOUNTER — Emergency Department (HOSPITAL_COMMUNITY)
Admission: EM | Admit: 2015-01-08 | Discharge: 2015-01-08 | Discharge status: Against Medical Advice | Payer: No Typology Code available for payment source | Attending: Emergency Medicine | Admitting: Emergency Medicine

## 2015-01-08 DIAGNOSIS — E669 Obesity, unspecified: Secondary | ICD-10-CM | POA: Diagnosis not present

## 2015-01-08 DIAGNOSIS — F419 Anxiety disorder, unspecified: Secondary | ICD-10-CM | POA: Insufficient documentation

## 2015-01-08 DIAGNOSIS — N183 Chronic kidney disease, stage 3 (moderate): Secondary | ICD-10-CM | POA: Diagnosis not present

## 2015-01-08 DIAGNOSIS — I129 Hypertensive chronic kidney disease with stage 1 through stage 4 chronic kidney disease, or unspecified chronic kidney disease: Secondary | ICD-10-CM | POA: Diagnosis not present

## 2015-01-08 NOTE — ED Notes (Signed)
Pt st's she forgot to take her HTN meds tonight and when she realized she had forgotten it she started to have a anxiety attack.

## 2015-01-08 NOTE — ED Notes (Signed)
Pt stated that she is going to go home since she was able to fall asleep in the waiting room, she feels like her anxiety is resolved.

## 2015-01-12 ENCOUNTER — Other Ambulatory Visit: Payer: Self-pay | Admitting: *Deleted

## 2015-01-12 MED ORDER — TRAZODONE HCL 50 MG PO TABS: ORAL_TABLET | ORAL | Status: DC

## 2015-01-12 MED ORDER — LANSOPRAZOLE 30 MG PO CPDR: DELAYED_RELEASE_CAPSULE | ORAL | Status: DC

## 2015-01-17 ENCOUNTER — Emergency Department (HOSPITAL_COMMUNITY)
Admission: EM | Admit: 2015-01-17 | Discharge: 2015-01-17 | Disposition: A | Discharge status: Home / Self Care | Payer: No Typology Code available for payment source | Attending: Emergency Medicine | Admitting: Emergency Medicine

## 2015-01-17 ENCOUNTER — Emergency Department (HOSPITAL_COMMUNITY): Payer: No Typology Code available for payment source

## 2015-01-17 DIAGNOSIS — R0602 Shortness of breath: Secondary | ICD-10-CM | POA: Diagnosis not present

## 2015-01-17 DIAGNOSIS — M1990 Unspecified osteoarthritis, unspecified site: Secondary | ICD-10-CM | POA: Diagnosis not present

## 2015-01-17 DIAGNOSIS — I129 Hypertensive chronic kidney disease with stage 1 through stage 4 chronic kidney disease, or unspecified chronic kidney disease: Secondary | ICD-10-CM | POA: Diagnosis not present

## 2015-01-17 DIAGNOSIS — R110 Nausea: Secondary | ICD-10-CM | POA: Diagnosis not present

## 2015-01-17 DIAGNOSIS — Z7982 Long term (current) use of aspirin: Secondary | ICD-10-CM | POA: Diagnosis not present

## 2015-01-17 DIAGNOSIS — R002 Palpitations: Secondary | ICD-10-CM

## 2015-01-17 DIAGNOSIS — Z87442 Personal history of urinary calculi: Secondary | ICD-10-CM | POA: Insufficient documentation

## 2015-01-17 DIAGNOSIS — N183 Chronic kidney disease, stage 3 (moderate): Secondary | ICD-10-CM | POA: Insufficient documentation

## 2015-01-17 DIAGNOSIS — R000 Tachycardia, unspecified: Secondary | ICD-10-CM | POA: Insufficient documentation

## 2015-01-17 DIAGNOSIS — Z79899 Other long term (current) drug therapy: Secondary | ICD-10-CM | POA: Insufficient documentation

## 2015-01-17 DIAGNOSIS — Z87891 Personal history of nicotine dependence: Secondary | ICD-10-CM | POA: Diagnosis not present

## 2015-01-17 DIAGNOSIS — K219 Gastro-esophageal reflux disease without esophagitis: Secondary | ICD-10-CM | POA: Insufficient documentation

## 2015-01-17 DIAGNOSIS — G894 Chronic pain syndrome: Secondary | ICD-10-CM | POA: Insufficient documentation

## 2015-01-17 DIAGNOSIS — F329 Major depressive disorder, single episode, unspecified: Secondary | ICD-10-CM | POA: Diagnosis not present

## 2015-01-17 DIAGNOSIS — R61 Generalized hyperhidrosis: Secondary | ICD-10-CM | POA: Diagnosis not present

## 2015-01-17 DIAGNOSIS — G478 Other sleep disorders: Secondary | ICD-10-CM | POA: Diagnosis not present

## 2015-01-17 DIAGNOSIS — E039 Hypothyroidism, unspecified: Secondary | ICD-10-CM | POA: Diagnosis not present

## 2015-01-17 DIAGNOSIS — F410 Panic disorder [episodic paroxysmal anxiety] without agoraphobia: Secondary | ICD-10-CM

## 2015-01-17 DIAGNOSIS — R079 Chest pain, unspecified: Secondary | ICD-10-CM | POA: Diagnosis not present

## 2015-01-17 DIAGNOSIS — F430 Acute stress reaction: Secondary | ICD-10-CM

## 2015-01-17 DIAGNOSIS — E669 Obesity, unspecified: Secondary | ICD-10-CM | POA: Insufficient documentation

## 2015-01-17 DIAGNOSIS — Z91040 Latex allergy status: Secondary | ICD-10-CM | POA: Diagnosis not present

## 2015-01-17 DIAGNOSIS — F419 Anxiety disorder, unspecified: Secondary | ICD-10-CM | POA: Diagnosis not present

## 2015-01-17 LAB — CBC
HEMATOCRIT: 51.2 % — AB (ref 36.0–46.0)
HEMOGLOBIN: 17.2 g/dL — AB (ref 12.0–15.0)
MCH: 33.4 pg (ref 26.0–34.0)
MCHC: 33.6 g/dL (ref 30.0–36.0)
MCV: 99.4 fL (ref 78.0–100.0)
Platelets: 271 10*3/uL (ref 150–400)
RBC: 5.15 MIL/uL — ABNORMAL HIGH (ref 3.87–5.11)
RDW: 14.2 % (ref 11.5–15.5)
WBC: 8.1 10*3/uL (ref 4.0–10.5)

## 2015-01-17 LAB — I-STAT TROPONIN, ED: Troponin i, poc: 0 ng/mL (ref 0.00–0.08)

## 2015-01-17 LAB — BASIC METABOLIC PANEL
Anion gap: 12 (ref 5–15)
BUN: 9 mg/dL (ref 6–20)
CHLORIDE: 99 mmol/L — AB (ref 101–111)
CO2: 25 mmol/L (ref 22–32)
CREATININE: 1.47 mg/dL — AB (ref 0.44–1.00)
Calcium: 9.2 mg/dL (ref 8.9–10.3)
GFR calc Af Amer: 46 mL/min — ABNORMAL LOW (ref >60)
GFR calc non Af Amer: 39 mL/min — ABNORMAL LOW (ref >60)
GLUCOSE: 103 mg/dL — AB (ref 65–99)
Potassium: 4.2 mmol/L (ref 3.5–5.1)
Sodium: 136 mmol/L (ref 135–145)

## 2015-01-17 MED ORDER — LORAZEPAM 1 MG PO TABS
1.0000 mg | ORAL_TABLET | Freq: Once | ORAL | Status: AC
  Administered 2015-01-17: 1 mg via ORAL
  Filled 2015-01-17: qty 1

## 2015-01-17 MED ORDER — LORAZEPAM 0.5 MG PO TABS: 1.0000 mg | ORAL_TABLET | Freq: Every evening | ORAL | Status: DC | PRN

## 2015-01-17 NOTE — ED Notes (Signed)
Patient here with complaint of chest pain. States onset at night when she lays down for bed. Reports history of similar. Endorses nausea and anxiety accompanying.

## 2015-01-17 NOTE — ED Notes (Signed)
Reviewed d/c instructions with pt, who voiced understanding. Pt departed under her own power and in NAD.

## 2015-01-17 NOTE — Discharge Instructions (Signed)
Palpitations [name], your work up today is normal.  Take lorazepam 1mg  1 hour before bedtime for your symptoms.  See your primary care doctor within 3 days for close follow up.  If symptoms worsen, come back to the ED immediately.  Thank you. A palpitation is the feeling that your heartbeat is irregular. It may feel like your heart is fluttering or skipping a beat. It may also feel like your heart is beating faster than normal. This is usually not a serious problem. In some cases, you may need more medical tests. HOME CARE  Avoid:  Caffeine in coffee, tea, soft drinks, diet pills, and energy drinks.  Chocolate.  Alcohol.  Stop smoking if you smoke.  Reduce your stress and anxiety. Try:  A method that measures bodily functions so you can learn to control them (biofeedback).  Yoga.  Meditation.  Physical activity such as swimming, jogging, or walking.  Get plenty of rest and sleep. GET HELP IF:  Your fast or irregular heartbeat continues after 24 hours.  Your palpitations occur more often. GET HELP RIGHT AWAY IF:   You have chest pain.  You feel short of breath.  You have a very bad headache.  You feel dizzy or pass out (faint). MAKE SURE YOU:   Understand these instructions.  Will watch your condition.  Will get help right away if you are not doing well or get worse.   This information is not intended to replace advice given to you by your health care provider. Make sure you discuss any questions you have with your health care provider.   Document Released: 10/17/2007 Document Revised: 01/28/2014 Document Reviewed: 03/08/2011 Elsevier Interactive Patient Education [employer].

## 2015-01-17 NOTE — ED Provider Notes (Signed)
CSN: [id number]     Arrival date & time 01/17/15  0034 History  By signing my name below, I, Helane Gunther, attest that this documentation has been prepared under the direction and in the presence of Everlene Balls, MD. Electronically Signed: Helane Gunther, ED Scribe. 01/17/2015. 4:02 AM.    Chief Complaint  Patient presents with  . Chest Pain   The history is provided by the patient and the spouse. No language interpreter was used.   HPI Comments: [name] is a 54 y.o. female former smoker with a PMHx of HLD and HTN who presents to the Emergency Department complaining of intermittent, aching chest pain onset 2 months ago. Pt states that at night when she goes to bed, she has been having palpitations. She notes tat her blood pressure and pain medication was then changed without relief. She states that because of this she has been having panic attacks as well (2-3x a week, per husband). She reports associated inability to sleep, SOB, diaphoresis, and nausea. Per husband, pt was given lorazepam for anxiety with mild relief, noting that they have not had to come to the hospital "quite as much." He notes that pt seems to have relief of the anxiety when going outside for a time to cool off. He states pt is scheduled for a sleep study for possible sleep apnea on 1/8. Pt notes she takes the lorazepam at the same time as her other medications. She denies vomiting.   Past Medical History  Diagnosis Date  . Allergy   . Arthritis   . Depression   . GERD (gastroesophageal reflux disease)   . Hyperlipidemia   . Hypertension   . Osteoporosis   . Hypothyroidism   . Anxiety   . Chronic pain syndrome   . Tobacco abuse   . Obesity   . Gastroparesis   . Ankylosing spondylitis ([place])   . Depression   . CKD (chronic kidney disease) stage 3, GFR 30-59 ml/min   . Nephrolithiasis    Past Surgical History  Procedure Laterality Date  . Total knee arthroplasty      left  . Back surgery    . Neck  surgery    . Appendectomy    . Cholecystectomy    . Right knee arthroplasty    . Doppler echocardiography  09/26/2010    EF=>55%, LV norm  . Nm myocar perf wall motion  09/26/2010    Lexiscan EF 83%  EF may be overestimated due to LVH   Family History  Problem Relation Age of Onset  . Diabetes Mother   . Heart failure Mother   . Hyperlipidemia Mother   . Hypertension Mother   . Hyperlipidemia Father   . Cancer Son     lymphoma stage 4 at 44   Social History  Substance Use Topics  . Smoking status: Former Smoker -- 1.00 packs/day    Types: Cigarettes    Quit date: 10/12/2010  . Smokeless tobacco: Not on file     Comment: Following her long hospitalization, she never went back to smoking  . Alcohol Use: No   OB History    No data available     Review of Systems A complete 10 system review of systems was obtained and all systems are negative except as noted in the HPI and PMH.   Allergies  Codeine; Latex; and Acyclovir and related  Home Medications   Prior to Admission medications   Medication Sig Start Date End Date Taking?  Authorizing Provider  allopurinol (ZYLOPRIM) 100 MG tablet Take 1 tablet (100 mg total) by mouth every morning. 11/22/14  Yes Aquilla Hacker, MD  aspirin EC 81 MG tablet Take 81 mg by mouth daily.     Yes Historical Provider, MD  colchicine 0.6 MG tablet TAKE 1 TABLET BY MOUTH EVERY DAY AS NEEDED Patient taking differently: TAKE 1 TABLET BY MOUTH EVERY DAY AS NEEDED FOR GOUT 01/03/15  Yes Aquilla Hacker, MD  cyclobenzaprine (FLEXERIL) 10 MG tablet Take 10 mg by mouth 2 (two) times daily.    Yes Historical Provider, MD  lansoprazole (PREVACID) 30 MG capsule TAKE 1 CAPSULE BY MOUTH EVERY DAY 01/12/15  Yes Aquilla Hacker, MD  levothyroxine (SYNTHROID, LEVOTHROID) 75 MCG tablet Take 1 tablet (75 mcg total) by mouth daily. 11/22/14  Yes Aquilla Hacker, MD  LORazepam (ATIVAN) 0.5 MG tablet Take 1 tablet (0.5 mg total) by mouth every 8 (eight) hours  as needed for anxiety. 01/04/15 01/04/16 Yes York Ram Melancon, MD  metoprolol succinate (TOPROL-XL) 25 MG 24 hr tablet Take 1 tablet (25 mg total) by mouth 2 (two) times daily. 12/21/14  Yes Rhonda G Barrett, PA-C  MOVANTIK 25 MG TABS Take 1 tablet by mouth daily as needed (to use the bathroom).  02/10/14  Yes Historical Provider, MD  oxyCODONE (OXYCONTIN) 20 mg 12 hr tablet Take 20 mg by mouth every 12 (twelve) hours.   Yes Historical Provider, MD  traZODone (DESYREL) 50 MG tablet TAKE 1/2 TO 1 TABLET BY MOUTH EVERY NIGHT AT BEDTIME AS NEEDED FOR SLEEP 01/12/15  Yes York Ram Melancon, MD   BP 126/88 mmHg  Pulse 84  Temp(Src) 98.3 F (36.8 C) (Oral)  Resp 13  Ht 5\' 4"  (1.626 m)  Wt 216 lb (97.977 kg)  BMI 37.06 kg/m2  SpO2 99%  LMP 05/17/2009 Physical Exam  Constitutional: She is oriented to person, place, and time. She appears well-developed and well-nourished. No distress.  HENT:  Head: Normocephalic and atraumatic.  Nose: Nose normal.  Mouth/Throat: Oropharynx is clear and moist. No oropharyngeal exudate.  Eyes: Conjunctivae and EOM are normal. Pupils are equal, round, and reactive to light. No scleral icterus.  Neck: Normal range of motion. Neck supple. No JVD present. No tracheal deviation present. No thyromegaly present.  Cardiovascular: Normal rate, regular rhythm and normal heart sounds.  Exam reveals no gallop and no friction rub.   No murmur heard. Pulmonary/Chest: Effort normal and breath sounds normal. No respiratory distress. She has no wheezes. She exhibits no tenderness.  Abdominal: Soft. Bowel sounds are normal. She exhibits no distension and no mass. There is no tenderness. There is no rebound and no guarding.  Musculoskeletal: Normal range of motion. She exhibits no edema or tenderness.  Lymphadenopathy:    She has no cervical adenopathy.  Neurological: She is alert and oriented to person, place, and time. No cranial nerve deficit. She exhibits normal muscle tone.   Skin: Skin is warm and dry. No rash noted. No erythema. No pallor.  Nursing note and vitals reviewed.   ED Course  Procedures  DIAGNOSTIC STUDIES: Oxygen Saturation is 99% on RA, normal by my interpretation.    COORDINATION OF CARE: 4:01 AM - Discussed normal lab, imaging, and EKG results. Discussed plans to order an increased dose of lorazepam, as well as a prescription to be taken 1 hour before going to bed. Pt advised of plan for treatment and pt agrees.  Labs Review Labs Reviewed  BASIC METABOLIC  PANEL - Abnormal; Notable for the following:    Chloride 99 (*)    Glucose, Bld 103 (*)    Creatinine, Ser 1.47 (*)    GFR calc non Af Amer 39 (*)    GFR calc Af Amer 46 (*)    All other components within normal limits  CBC - Abnormal; Notable for the following:    RBC 5.15 (*)    Hemoglobin 17.2 (*)    HCT 51.2 (*)    All other components within normal limits  I-STAT TROPOININ, ED    Imaging Review Dg Chest 2 View  01/17/2015  CLINICAL DATA:  54 year old female with chest pain and shortness of breath EXAM: CHEST  2 VIEW COMPARISON:  Radiograph dated 12/16/2014 FINDINGS: The heart size and mediastinal contours are within normal limits. Both lungs are clear. Lower cervical fixation plate and screws. IMPRESSION: No active cardiopulmonary disease. Electronically Signed   By: Anner Crete M.D.   On: 01/17/2015 01:37   I have personally reviewed and evaluated these images and lab results as part of my medical decision-making.   EKG Interpretation   Date/Time:  Tuesday January 17 2015 00:34:32 EST Ventricular Rate:  110 PR Interval:  150 QRS Duration: 74 QT Interval:  324 QTC Calculation: 438 R Axis:   34 Text Interpretation:  Sinus tachycardia Cannot rule out Anterior infarct ,  age undetermined Abnormal ECG tachycardia now present Confirmed by Glynn Octave [phone]) on 01/17/2015 4:31:22 AM      MDM   Final diagnoses:  None   Patient presents to the ED  for palpitations she states is concerning for her anxiety.  Her ativan does help somewhat but does not completely resolve it.  I advised her to increase her ativan to 1mg  and take 1 hour before bed time.  She demonstrates good understanding.  She was given a dose in the ED and encouraged to fu with PCP for further management.  Her EKG is unremarkable other than tachycardia.  Troponin is negative although history is not consistent with ACS.  Trop ordered from triage.  She now appears well on my exam and in NAD.  VS remain within her normal limits and she is safe for DC.    I personally performed the services described in this documentation, which was scribed in my presence. The recorded information has been reviewed and is accurate.     Everlene Balls, MD 01/17/15 [phone]

## 2015-01-29 ENCOUNTER — Encounter (HOSPITAL_BASED_OUTPATIENT_CLINIC_OR_DEPARTMENT_OTHER): Payer: No Typology Code available for payment source

## 2015-02-01 ENCOUNTER — Encounter: Payer: Self-pay | Admitting: Physician Assistant

## 2015-02-01 ENCOUNTER — Ambulatory Visit (INDEPENDENT_AMBULATORY_CARE_PROVIDER_SITE_OTHER): Payer: No Typology Code available for payment source | Admitting: Physician Assistant

## 2015-02-01 VITALS — BP 94/70 | HR 81 | Ht 64.0 in | Wt 201.5 lb

## 2015-02-01 DIAGNOSIS — R002 Palpitations: Secondary | ICD-10-CM

## 2015-02-01 NOTE — Patient Instructions (Signed)
Rhonda Barrett, PA-C, has made no changes today in your current medications or treatment plan.  Your physician recommends that you follow-up with Dr Ellyn Hack as needed. Please follow-up with your primary care physician.

## 2015-02-01 NOTE — Progress Notes (Signed)
Cardiology Office Note   Date:  02/02/2015   ID:  [name], DOB 12/10/1960, MRN [id number]  PCP:  Paula Compton, MD  Cardiologist:  Dr. Nolon Stalls, PA-C   Chief Complaint  Patient presents with  . Follow-up      chest pain-having tightness and pressure, has shortness of breath, has edema, has pain in legs, has cramping in legs, has lightheadedness or dizziness    History of Present Illness: [name] is a 55 y.o. female with a history of RA, ankylosing spondylitis w/ scoliosis, HL, palpitations, chest pain (prev echo and MV 2012 OK).  ER visits for chest pain 11/21 and 11/25, o.v. 11/30, no stress test at this time, event monitor ordered.  [name] presents for f/u of the event monitor.  She has continued to struggle with episodes of feeling her heart pound, get SOB and BP high. Several ER visits for same. Pt states her BP goes high with these episodes. The highest one recorded is 155/113 and another one of 145/103, but generally, HR and BP are normal.   Her chest pain continues to be atypical.  She admits that anxiety is a significant problem for her and she feels very anxious during her episodes. She was recently given a short-term rx for Ativan which helps shorten the episodes.  Past Medical History  Diagnosis Date  . Allergy   . Arthritis   . Depression   . GERD (gastroesophageal reflux disease)   . Hyperlipidemia   . Hypertension   . Osteoporosis   . Hypothyroidism   . Anxiety   . Chronic pain syndrome   . Tobacco abuse   . Obesity   . Gastroparesis   . Ankylosing spondylitis ([place])   . Depression   . CKD (chronic kidney disease) stage 3, GFR 30-59 ml/min   . Nephrolithiasis     Past Surgical History  Procedure Laterality Date  . Total knee arthroplasty      left  . Back surgery    . Neck surgery    . Appendectomy    . Cholecystectomy    . Right knee arthroplasty    . Doppler echocardiography  09/26/2010    EF=>55%, LV  norm  . Nm myocar perf wall motion  09/26/2010    Lexiscan EF 83%  EF may be overestimated due to LVH    Current Outpatient Prescriptions  Medication Sig Dispense Refill  . allopurinol (ZYLOPRIM) 100 MG tablet Take 1 tablet (100 mg total) by mouth every morning. 60 tablet 5  . aspirin EC 81 MG tablet Take 81 mg by mouth daily.      . colchicine 0.6 MG tablet TAKE 1 TABLET BY MOUTH EVERY DAY AS NEEDED (Patient taking differently: TAKE 1 TABLET BY MOUTH EVERY DAY AS NEEDED FOR GOUT) 30 tablet 0  . cyclobenzaprine (FLEXERIL) 10 MG tablet Take 10 mg by mouth 2 (two) times daily.     . lansoprazole (PREVACID) 30 MG capsule TAKE 1 CAPSULE BY MOUTH EVERY DAY 90 capsule 0  . levothyroxine (SYNTHROID, LEVOTHROID) 75 MCG tablet Take 1 tablet (75 mcg total) by mouth daily. 90 tablet 4  . LORazepam (ATIVAN) 0.5 MG tablet Take 2 tablets (1 mg total) by mouth at bedtime as needed for anxiety or sleep. 10 tablet 0  . metoprolol succinate (TOPROL-XL) 25 MG 24 hr tablet Take 1 tablet (25 mg total) by mouth 2 (two) times daily. 60 tablet 11  . MOVANTIK 25  MG TABS Take 1 tablet by mouth daily as needed (to use the bathroom).   0  . oxyCODONE (OXYCONTIN) 20 mg 12 hr tablet Take 20 mg by mouth every 12 (twelve) hours.    . traZODone (DESYREL) 50 MG tablet TAKE 1/2 TO 1 TABLET BY MOUTH EVERY NIGHT AT BEDTIME AS NEEDED FOR SLEEP 30 tablet 0   No current facility-administered medications for this visit.    Allergies:   Codeine; Latex; and Acyclovir and related    Social History:  The patient  reports that she quit smoking about 4 years ago. Her smoking use included Cigarettes. She smoked 1.00 pack per day. She does not have any smokeless tobacco history on file. She reports that she does not drink alcohol or use illicit drugs.   Family History:  The patient's family history includes Cancer in her son; Diabetes in her mother; Heart failure in her mother; Hyperlipidemia in her father and mother; Hypertension in her  mother.    ROS:  Please see the history of present illness. All other systems are reviewed and negative.    PHYSICAL EXAM: VS:  BP 94/70 mmHg  Pulse 81  Ht 5\' 4"  (1.626 m)  Wt 201 lb 8 oz (91.4 kg)  BMI 34.57 kg/m2  LMP 05/17/2009 , BMI Body mass index is 34.57 kg/(m^2). GEN: Well nourished, well developed, female in no acute distress HEENT: normal for age  Neck: no JVD, no carotid bruit, no masses Cardiac: RRR; no murmur, no rubs, or gallops Respiratory:  clear to auscultation bilaterally, normal work of breathing GI: soft, nontender, nondistended, + BS MS: no deformity or atrophy; no edema; distal pulses are 2+ in all 4 extremities  Skin: warm and dry, no rash Neuro:  Strength and sensation are intact Psych: euthymic mood, full affect   EKG:  EKG is ordered today. The ekg ordered today demonstrates SR, no acute ischemic changes   Recent Labs: 02/07/2014: TSH 2.937 12/21/2014: Magnesium 1.9 01/17/2015: BUN 9; Creatinine, Ser 1.47*; Hemoglobin 17.2*; Platelets 271; Potassium 4.2; Sodium 136    Lipid Panel    Component Value Date/Time   CHOL 151 12/03/2012 1729   TRIG 209* 12/03/2012 1729   HDL 40 12/03/2012 1729   CHOLHDL 3.8 12/03/2012 1729   VLDL 42* 12/03/2012 1729   LDLCALC 69 12/03/2012 1729   LDLDIRECT 174* 09/06/2013 0950     Wt Readings from Last 3 Encounters:  02/01/15 201 lb 8 oz (91.4 kg)  01/17/15 216 lb (97.977 kg)  01/08/15 209 lb 14.4 oz (95.21 kg)     Other studies Reviewed: Additional studies/ records that were reviewed today include: previous office notes and other records, event monitor report.  ASSESSMENT AND PLAN:  1.  Palpitations: Reviewed the event monitor in detail with patient. She had no arrhythmia. She had frequent symptoms and her HR would elevate during her episodes, but only in the 99-110 range. No ectopy at all. BP is on the low side today, but she is not symptomatic from this. Reassured her it was OK, no change in BB  dose.  Advised the patient that I did not find any cardiac cause for her symptoms, the increased heart rate is in response to her systemic issues. No further cardiac workup needed.  2. Anxiety: The patient admits this is a significant problem for her. Asked her to f/u with primary MD for further management of this.  3. Chest pain: sx have remained atypical and no further workup is indicated at this  time. CRF reduction is encouraged, will leave to primary MD.  Current medicines are reviewed at length with the patient today.  The patient does not have concerns regarding medicines.  The following changes have been made:  no change  Labs/ tests ordered today include:  No orders of the defined types were placed in this encounter.     Disposition:   FU with Dr Ellyn Hack as needed.  Augusto Garbe  02/02/2015 9:41 AM    [facility] [place], [place], [place]  [address] Phone: [phone]; Fax: [phone]

## 2015-02-02 ENCOUNTER — Encounter: Payer: Self-pay | Admitting: Physician Assistant

## 2015-02-08 ENCOUNTER — Other Ambulatory Visit: Payer: Self-pay | Admitting: Family Medicine

## 2015-02-08 MED ORDER — COLCHICINE 0.6 MG PO TABS: 0.6000 mg | ORAL_TABLET | Freq: Every day | ORAL | Status: DC | PRN

## 2015-02-08 MED ORDER — TRAZODONE HCL 50 MG PO TABS: ORAL_TABLET | ORAL | Status: DC

## 2015-02-08 NOTE — Telephone Encounter (Signed)
Pt needs refill on traZODone (DESYREL) 50 MG tablet and colchicine 0.6 MG tablet.  Pt has changed her pharmacy, PLEASE SEND TO [facility] [address] [place]. [phone]. Thank you, Fonda Kinder, ASA

## 2015-02-08 NOTE — Telephone Encounter (Signed)
Refilled. CGM MD 

## 2015-02-15 ENCOUNTER — Encounter: Payer: Self-pay | Admitting: Family Medicine

## 2015-02-15 ENCOUNTER — Ambulatory Visit (INDEPENDENT_AMBULATORY_CARE_PROVIDER_SITE_OTHER): Payer: No Typology Code available for payment source | Admitting: Family Medicine

## 2015-02-15 VITALS — BP 101/69 | HR 88 | Temp 98.1°F | Wt 202.2 lb

## 2015-02-15 DIAGNOSIS — F430 Acute stress reaction: Secondary | ICD-10-CM

## 2015-02-15 DIAGNOSIS — F411 Generalized anxiety disorder: Secondary | ICD-10-CM

## 2015-02-15 DIAGNOSIS — F329 Major depressive disorder, single episode, unspecified: Secondary | ICD-10-CM | POA: Diagnosis not present

## 2015-02-15 DIAGNOSIS — F410 Panic disorder [episodic paroxysmal anxiety] without agoraphobia: Secondary | ICD-10-CM | POA: Diagnosis not present

## 2015-02-15 DIAGNOSIS — F32A Depression, unspecified: Secondary | ICD-10-CM

## 2015-02-15 MED ORDER — SERTRALINE HCL 50 MG PO TABS: 50.0000 mg | ORAL_TABLET | Freq: Every day | ORAL | Status: DC

## 2015-02-15 MED ORDER — LORAZEPAM 0.5 MG PO TABS: 1.0000 mg | ORAL_TABLET | Freq: Every evening | ORAL | Status: DC | PRN

## 2015-02-15 NOTE — Patient Instructions (Signed)
Thanks for coming in today   We will start you on Zoloft. Take 50mg  once daily for the first week and then increase to 100mg  once daily if you are able to tolerate it.   Make an appointment to return in two weeks.   Thanks for letting us take care of you.   Sincerely, Paula Compton, MD Family Medicine - PGY 2

## 2015-02-16 NOTE — Progress Notes (Signed)
Patient ID: [name], female   DOB: [birth year], 55 y.o.   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  - Unclear what her triggers are.  - She says the words "I'm going to bed" coming from her husband make her go into a panic attack. If he says "Let's go to bed" it does not cause her to become more anxious.  - One ED visit since our last office visit which is an improvement.  - Cards has worked her up for arrhythmias via Holter monitor which returned without any abnormalities.  - She continues to use Ativan 1 mg at night, but also has to take trazodone to help her sleep.  - She only uses Ativan at night.  - Has been on Celexa in the past for depression symptoms related to several family deaths.   - Denies depressive symptoms, appetite change, feels that she has no problem doing the things that she wants but continues to mainly struggle with anxiety.  - She says she is open to restarting SSRI for maintenance.  - PHQ9 - 8 - No SI/HI  All relevant systems were reviewed and were negative unless otherwise noted in the HPI  Past Medical History Reviewed problem list.  Medications- reviewed and updated Current Outpatient Prescriptions  Medication Sig Dispense Refill  . allopurinol (ZYLOPRIM) 100 MG tablet Take 1 tablet (100 mg total) by mouth every morning. 60 tablet 5  . aspirin EC 81 MG tablet Take 81 mg by mouth daily.      . colchicine 0.6 MG tablet Take 1 tablet (0.6 mg total) by mouth daily as needed. 30 tablet 5  . cyclobenzaprine (FLEXERIL) 10 MG tablet Take 10 mg by mouth 2 (two) times daily.     Marland Kitchen gabapentin (NEURONTIN) 300 MG capsule Take 300 mg by mouth 3 (three) times daily.    . lansoprazole (PREVACID) 30 MG capsule TAKE 1 CAPSULE BY MOUTH EVERY DAY 90 capsule 0  . levothyroxine (SYNTHROID, LEVOTHROID) 75 MCG tablet Take 1  tablet (75 mcg total) by mouth daily. 90 tablet 4  . LORazepam (ATIVAN) 0.5 MG tablet Take 2 tablets (1 mg total) by mouth at bedtime as needed for anxiety or sleep. 30 tablet 0  . metoprolol succinate (TOPROL-XL) 25 MG 24 hr tablet Take 1 tablet (25 mg total) by mouth 2 (two) times daily. 60 tablet 11  . MOVANTIK 25 MG TABS Take 1 tablet by mouth daily as needed (to use the bathroom).   0  . oxyCODONE (OXYCONTIN) 20 mg 12 hr tablet Take 20 mg by mouth every 12 (twelve) hours.    . traZODone (DESYREL) 50 MG tablet TAKE 1/2 TO 1 TABLET BY MOUTH EVERY NIGHT AT BEDTIME AS NEEDED FOR SLEEP 90 tablet 5  . sertraline (ZOLOFT) 50 MG tablet Take 1 tablet (50 mg total) by mouth daily. 30 tablet 0   No current facility-administered medications for this visit.   Chief complaint-noted No additions to family history Social history- patient is a continues to smoke 1ppd  Objective: BP 101/69 mmHg  Pulse 88  Temp(Src) 98.1 F (36.7 C) (Oral)  Wt 202 lb 3.2 oz (91.717 kg)  LMP 05/17/2009 Gen: NAD, alert, cooperative with exam HEENT: NCAT, EOMI, PERRL Neck: FROM, supple CV: RRR, good S1/S2, no murmur Resp: CTABL, no wheezes, non-labored  Abd: SNTND, BS present, no guarding or organomegaly Ext: No edema, warm, normal tone, moves UE/LE spontaneously Neuro: Alert and oriented, No gross deficits Skin: no rashes no lesions  Assessment/Plan:  ANXIETY Pt. With continued poorly controlled anxiety. Is using ativan prn at night. She is open to starting SSRI this visit. 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  - Start Zoloft 50mg  for one week and increase by 50 mg on week 2.  - will see her back in 2 weeks.  - Side effects discussed. No SI/HI - I hope that this will help her to get better control of anxiety.  - Ativan 1mg  prn

## 2015-02-16 NOTE — Assessment & Plan Note (Signed)
Pt. With continued poorly controlled anxiety. Is using ativan prn at night. She is open to starting SSRI this visit. 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  - Start Zoloft 50mg  for one week and increase by 50 mg on week 2.  - will see her back in 2 weeks.  - Side effects discussed. No SI/HI - I hope that this will help her to get better control of anxiety.  - Ativan 1mg  prn

## 2015-02-23 ENCOUNTER — Telehealth: Payer: Self-pay | Admitting: Family Medicine

## 2015-02-23 NOTE — Telephone Encounter (Signed)
Called to inform FMLA paperwork ready for pickup at the front desk. Left VM for the patient.   CGM MD

## 2015-02-27 ENCOUNTER — Ambulatory Visit (HOSPITAL_BASED_OUTPATIENT_CLINIC_OR_DEPARTMENT_OTHER): Payer: No Typology Code available for payment source

## 2015-03-06 ENCOUNTER — Ambulatory Visit: Payer: Medicare Other | Admitting: Family Medicine

## 2015-03-15 ENCOUNTER — Ambulatory Visit (HOSPITAL_BASED_OUTPATIENT_CLINIC_OR_DEPARTMENT_OTHER): Payer: No Typology Code available for payment source | Attending: Physician Assistant | Admitting: *Deleted

## 2015-03-15 VITALS — Ht 64.0 in | Wt 200.0 lb

## 2015-03-15 DIAGNOSIS — G4734 Idiopathic sleep related nonobstructive alveolar hypoventilation: Secondary | ICD-10-CM | POA: Diagnosis not present

## 2015-03-15 DIAGNOSIS — Z7982 Long term (current) use of aspirin: Secondary | ICD-10-CM | POA: Diagnosis not present

## 2015-03-15 DIAGNOSIS — R0683 Snoring: Secondary | ICD-10-CM | POA: Insufficient documentation

## 2015-03-15 DIAGNOSIS — G4730 Sleep apnea, unspecified: Secondary | ICD-10-CM | POA: Diagnosis not present

## 2015-03-15 DIAGNOSIS — G4733 Obstructive sleep apnea (adult) (pediatric): Secondary | ICD-10-CM | POA: Diagnosis not present

## 2015-03-15 DIAGNOSIS — G4736 Sleep related hypoventilation in conditions classified elsewhere: Secondary | ICD-10-CM | POA: Diagnosis not present

## 2015-03-15 DIAGNOSIS — Z79899 Other long term (current) drug therapy: Secondary | ICD-10-CM | POA: Diagnosis not present

## 2015-03-20 ENCOUNTER — Other Ambulatory Visit: Payer: Self-pay | Admitting: Family Medicine

## 2015-03-20 DIAGNOSIS — F430 Acute stress reaction: Secondary | ICD-10-CM

## 2015-03-20 DIAGNOSIS — F410 Panic disorder [episodic paroxysmal anxiety] without agoraphobia: Secondary | ICD-10-CM

## 2015-03-20 DIAGNOSIS — F32A Depression, unspecified: Secondary | ICD-10-CM

## 2015-03-20 DIAGNOSIS — F411 Generalized anxiety disorder: Secondary | ICD-10-CM

## 2015-03-20 DIAGNOSIS — F329 Major depressive disorder, single episode, unspecified: Secondary | ICD-10-CM

## 2015-03-20 NOTE — Telephone Encounter (Signed)
Pt called and needs refills on her Ativan and Zoloft. Please let her know when ready to pick up jw

## 2015-03-21 MED ORDER — LORAZEPAM 0.5 MG PO TABS: 1.0000 mg | ORAL_TABLET | Freq: Every evening | ORAL | Status: DC | PRN

## 2015-03-21 MED ORDER — SERTRALINE HCL 50 MG PO TABS: 50.0000 mg | ORAL_TABLET | Freq: Every day | ORAL | Status: DC

## 2015-03-21 NOTE — Telephone Encounter (Signed)
Spoke with patient and she is aware that script was called into pharmacy. Leaf [facility],CMA

## 2015-03-21 NOTE — Telephone Encounter (Signed)
Refilled medications. Zoloft sent to pharmacy. Unable to make it to clinic, but script printed remotely. Please have her come and pick up the script at clinic. She missed her last appointment and was unable to get refills then. She has an appt. On 3/9. Will see her then.   CGM MD

## 2015-03-30 ENCOUNTER — Ambulatory Visit (INDEPENDENT_AMBULATORY_CARE_PROVIDER_SITE_OTHER): Payer: No Typology Code available for payment source | Admitting: Family Medicine

## 2015-03-30 ENCOUNTER — Encounter: Payer: Self-pay | Admitting: Family Medicine

## 2015-03-30 VITALS — BP 106/74 | HR 77 | Temp 98.7°F | Wt 192.7 lb

## 2015-03-30 DIAGNOSIS — F4323 Adjustment disorder with mixed anxiety and depressed mood: Secondary | ICD-10-CM

## 2015-03-30 DIAGNOSIS — F410 Panic disorder [episodic paroxysmal anxiety] without agoraphobia: Secondary | ICD-10-CM

## 2015-03-30 DIAGNOSIS — F430 Acute stress reaction: Principal | ICD-10-CM

## 2015-03-30 MED ORDER — LORAZEPAM 0.5 MG PO TABS: 1.0000 mg | ORAL_TABLET | Freq: Every evening | ORAL | Status: DC | PRN

## 2015-03-30 NOTE — Patient Instructions (Signed)
Thanks for coming in today.   We will increase the dose of your medicine to 100mg  daily of the Zoloft.   If you have any symptoms stop your medicine, and let me know.   We will refer you to psychiatry if this does not help your symptoms.   We will refill your ativan today until we get your anxiety better under control.   Thanks for letting us take care of you.   Sincerely, Paula Compton, MD Family Medicine -PGY 2

## 2015-03-31 NOTE — Progress Notes (Signed)
Patient ID: [name], female   DOB: [birth year], 55 y.o.   MRN: [id number]   [facility] Aquilla Hacker, MD Phone: [phone]  Subjective:   # F/U Anxiety  - pt. Here for follow up, had been started on Zoloft with instruction to increase her dose after one week to 100mg  daily.o  - She did not do this.  - Does not feel better, though GAD 7 is only 5 today.  - Has had several episodes of "panic attack", has not had to go to the ED.  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  - She continues to desire the Ativan for her symptoms. Does not want to use antidepressants.  - High risk with combination Benzo rx and Oxycontin rx from her pain clinic.  - denies SI/HI today.   All relevant systems were reviewed and were negative unless otherwise noted in the HPI  Past Medical History Reviewed problem list.  Medications- reviewed and updated Current Outpatient Prescriptions  Medication Sig Dispense Refill  . allopurinol (ZYLOPRIM) 100 MG tablet Take 1 tablet (100 mg total) by mouth every morning. 60 tablet 5  . aspirin EC 81 MG tablet Take 81 mg by mouth daily.      . colchicine 0.6 MG tablet Take 1 tablet (0.6 mg total) by mouth daily as needed. 30 tablet 5  . cyclobenzaprine (FLEXERIL) 10 MG tablet Take 10 mg by mouth 2 (two) times daily.     Marland Kitchen gabapentin (NEURONTIN) 300 MG capsule Take 300 mg by mouth 3 (three) times daily.    . lansoprazole (PREVACID) 30 MG capsule TAKE 1 CAPSULE BY MOUTH EVERY DAY 90 capsule 0  . levothyroxine (SYNTHROID, LEVOTHROID) 75 MCG tablet Take 1 tablet (75 mcg total) by mouth daily. 90 tablet 4  . LORazepam (ATIVAN) 0.5 MG tablet Take 2 tablets (1 mg total) by mouth at bedtime as needed for anxiety or sleep. 90 tablet 0  . metoprolol succinate (TOPROL-XL) 25 MG 24 hr tablet Take 1 tablet (25 mg total) by mouth 2 (two) times daily. 60 tablet 11  . MOVANTIK 25 MG  TABS Take 1 tablet by mouth daily as needed (to use the bathroom).   0  . oxyCODONE (OXYCONTIN) 20 mg 12 hr tablet Take 20 mg by mouth every 12 (twelve) hours.    . sertraline (ZOLOFT) 50 MG tablet Take 1 tablet (50 mg total) by mouth daily. 30 tablet 0  . traZODone (DESYREL) 50 MG tablet TAKE 1/2 TO 1 TABLET BY MOUTH EVERY NIGHT AT BEDTIME AS NEEDED FOR SLEEP 90 tablet 5   No current facility-administered medications for this visit.   Chief complaint-noted No additions to family history Social history- patient is a current 1ppd smoker  Objective: BP 106/74 mmHg  Pulse 77  Temp(Src) 98.7 F (37.1 C) (Oral)  Wt 192 lb 11.2 oz (87.408 kg)  LMP 05/17/2009 Gen: NAD, alert, cooperative with exam HEENT: NCAT, EOMI, PERRL Neck: FROM, supple CV: RRR, good S1/S2, no murmur Resp: CTABL, no wheezes, non-labored Abd: SNTND, BS present, no guarding or organomegaly Ext: No edema, warm, normal tone, moves UE/LE spontaneously Neuro: Alert and oriented, No gross deficits Skin: no rashes no lesions  Assessment/Plan: ANXIETY Skipped her last appointment. Came today, and had not dose adjusted Zoloft as instructed. Desires more Ativan. She is willing to try to increase Zoloft to 100mg  daily. Her  GAD 7 is only 5. Increasingly difficult case to manage. She is high risk according to CDC guidelines with concurrent benzo and oxycontin rx. I am hesitant to continue prescribing ativan for her. Will see if increasing her Zoloft dose will help. If not, may consider referral.  - Zoloft to 100mg  daily.  - pt. To discontinue if she develops any symptoms whatsoever. 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 Need to be careful with Zoloft. Pt. Understands this risk, agrees to continue, and she says she will stop it if any symptoms arise.  - Hesitant to continue ativan.  - Follow up in 1 month.  - If no better, or if not liking zoloft, will refer. Her symptoms are inconsistent as her GAD 7  is only 5

## 2015-03-31 NOTE — Assessment & Plan Note (Signed)
Skipped her last appointment. Came today, and had not dose adjusted Zoloft as instructed. Desires more Ativan. She is willing to try to increase Zoloft to 100mg  daily. Her GAD 7 is only 5. Increasingly difficult case to manage. She is high risk according to CDC guidelines with concurrent benzo and oxycontin rx. I am hesitant to continue prescribing ativan for her. Will see if increasing her Zoloft dose will help. If not, may consider referral.  - Zoloft to 100mg  daily.  - pt. To discontinue if she develops any symptoms whatsoever. 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 Need to be careful with Zoloft. Pt. Understands this risk, agrees to continue, and she says she will stop it if any symptoms arise.  - Hesitant to continue ativan.  - Follow up in 1 month.  - If no better, or if not liking zoloft, will refer. Her symptoms are inconsistent as her GAD 7 is only 5

## 2015-04-04 ENCOUNTER — Telehealth: Payer: Self-pay | Admitting: Cardiology

## 2015-04-04 NOTE — Telephone Encounter (Signed)
New Message  Pt is returning the call to discuss the Sleep study results.

## 2015-04-10 ENCOUNTER — Other Ambulatory Visit: Payer: Self-pay | Admitting: *Deleted

## 2015-04-10 ENCOUNTER — Telehealth: Payer: Self-pay | Admitting: *Deleted

## 2015-04-10 DIAGNOSIS — Z9981 Dependence on supplemental oxygen: Principal | ICD-10-CM

## 2015-04-10 DIAGNOSIS — R0902 Hypoxemia: Secondary | ICD-10-CM

## 2015-04-10 NOTE — Telephone Encounter (Signed)
Spoke with patient advising her of her sleep study results and recommendations.

## 2015-04-10 NOTE — Sleep Study (Signed)
NAME: [name]  Patient Name: [name], [name] Date: 03/15/2015 Gender: Female D.O.B: [birth year] Age (years): 31 Referring Provider: Evelene Croon Barrett PA-C Height (inches): 64 Interpreting Physician: Shelva Majestic MD, ABSM Weight (lbs): 200 RPSGT: Gerhard Perches BMI: 34 MRN: [id number] Neck Size: 14.50  CLINICAL INFORMATION Sleep Study Type: NPSG Indication for sleep study: OSA Epworth Sleepiness Score: 2  SLEEP STUDY TECHNIQUE As per the AASM Manual for the Scoring of Sleep and Associated Events v2.3 (April 2016) with a hypopnea requiring 4% desaturations. The channels recorded and monitored were frontal, central and occipital EEG, electrooculogram (EOG), submentalis EMG (chin), nasal and oral airflow, thoracic and abdominal wall motion, anterior tibialis EMG, snore microphone, electrocardiogram, and pulse oximetry.  MEDICATIONS  allopurinol (ZYLOPRIM) 100 MG tablet 100 mg, Every morning - 10a     aspirin EC 81 MG tablet 81 mg, Daily     Note: .... (Written 11/03/2013 0658)   colchicine 0.6 MG tablet 0.6 mg, Daily PRN     cyclobenzaprine (FLEXERIL) 10 MG tablet 10 mg, 2 times daily     gabapentin (NEURONTIN) 300 MG capsule 300 mg, 3 times daily     lansoprazole (PREVACID) 30 MG capsule      levothyroxine (SYNTHROID, LEVOTHROID) 75 MCG tablet 75 mcg, Daily     LORazepam (ATIVAN) 0.5 MG tablet 1 mg, At bedtime PRN     metoprolol succinate (TOPROL-XL) 25 MG 24 hr tablet 25 mg, 2 times daily     MOVANTIK 25 MG TABS 1 tablet, Daily PRN     oxyCODONE (OXYCONTIN) 20 mg 12 hr tablet 20 mg, Every 12 hours     sertraline (ZOLOFT) 50 MG tablet 50 mg, Daily     traZODone (DESYREL) 50 MG tablet   Medications self-administered by patient during sleep study : No sleep medicine administered.  SLEEP ARCHITECTURE The study was initiated at 11:22:01 PM and ended at 5:25:32 AM. Sleep onset time was 7.4 minutes and the sleep efficiency was 85.3%. The total sleep time was 310.1  minutes. Wake after sleep onset (WASO) was 46 minutes. Stage REM latency was N/A minutes. The patient spent 10.96% of the night in stage N1 sleep, 89.04% in stage N2 sleep, 0.00% in stage N3 and 0.00% in REM. Alpha intrusion was absent. Supine sleep was 100.00%.  RESPIRATORY PARAMETERS The overall apnea/hypopnea index (AHI) was 2.7 per hour. There were 13 total apneas, including 2 obstructive, 11 central and 0 mixed apneas. There were 1 hypopneas and 1 RERAs. The AHI during Stage REM sleep was N/A per hour. AHI while supine was 2.7 per hour. The mean oxygen saturation was 89.91%. The minimum SpO2 during sleep was 71.00%. The patient was started on supplemental oxygen shortly after initiating the study due to persistent hypoxemia  but did not increase to > 88% on 1 l/m, and was titrated to 2 l/min with improvement in O2 sat to 88-94%.  Soft snoring was noted during this study.  CARDIAC DATA The 2 lead EKG demonstrated sinus rhythm. The mean heart rate was 62.53 beats per minute. Other EKG findings include: None.  LEG MOVEMENT DATA The total PLMS were 13 with a resulting PLMS index of 2.52. Associated arousal with leg movement index was 0.8 .  IMPRESSIONS - No significant obstructive sleep apnea occurred during this study (AHI = 2.7/h); however, there was absence of REM sleep which may result in an underestimation of sleep disordered breathing. - No significant central sleep apnea occurred during this study (CAI = 2.1/h). -  Severe oxygen desaturation to a nadir of 71.00% requiring supplemental oxygen at 2 l/m. - Abnormal sleep architecture with absence of slow wave and REM sleep. - Soft snoring volume. - No cardiac abnormalities were noted during this study. - Clinically significant periodic limb movements did not occur during sleep. No significant associated arousals.  DIAGNOSIS - Nocturnal Hypoxemia (327.26 [G47.36 ICD-10])  RECOMMENDATIONS - At present there is no indication for  CPAP therapy; however, REM sleep did not occur and the AHI reported may underestimate the severity of the patient's sleep disordered breathing. Consider f/u testing if indicated in the future. - Due to significant nocturnal hypoxemia, recommend oxygen supplementation at 2 l/m with f/u overnight oximetry. - REM suppression may be contributed by the patient's medications.  - Avoid alcohol, sedatives and other CNS depressants that may worsen sleep apnea and disrupt normal sleep architecture. - Sleep hygiene should be reviewed to assess factors that may improve sleep quality. - Weight management (BMI 34) and regular exercise should be initiated.   Troy Sine, MD, [place], American Board of Sleep Medicine  ELECTRONICALLY SIGNED ON:  04/10/2015, 10:28 AM [place] PH: [phone]   FX: [phone] [place]

## 2015-04-10 NOTE — Telephone Encounter (Signed)
-----   Message from Troy Sine, MD sent at 04/10/2015 10:47 AM EDT ----- Mariann Laster please set up for supplemental nocturnal oxygen per report.

## 2015-04-10 NOTE — Telephone Encounter (Signed)
Called and informed patient of sleep study results and recommendations. Patient voiced understanding.

## 2015-04-10 NOTE — Progress Notes (Signed)
Patient notified of sleep study results and recommendations. ONO and O2 ordered through [facility].

## 2015-04-17 ENCOUNTER — Other Ambulatory Visit: Payer: Self-pay | Admitting: *Deleted

## 2015-04-17 DIAGNOSIS — F32A Depression, unspecified: Secondary | ICD-10-CM

## 2015-04-17 DIAGNOSIS — F411 Generalized anxiety disorder: Secondary | ICD-10-CM

## 2015-04-17 DIAGNOSIS — F410 Panic disorder [episodic paroxysmal anxiety] without agoraphobia: Secondary | ICD-10-CM

## 2015-04-17 DIAGNOSIS — F329 Major depressive disorder, single episode, unspecified: Secondary | ICD-10-CM

## 2015-04-17 MED ORDER — SERTRALINE HCL 50 MG PO TABS: 50.0000 mg | ORAL_TABLET | Freq: Every day | ORAL | Status: DC

## 2015-04-19 ENCOUNTER — Telehealth: Payer: Self-pay | Admitting: Cardiology

## 2015-04-19 NOTE — Telephone Encounter (Signed)
New message  Pt called states that she recently had a sleep study. req a call back to discuss if she dreamed during the sleep study. She states that she hadn't had a dream in 5 years and she is not sure if that is a bad thing. Request a call back to discuss.

## 2015-04-19 NOTE — Telephone Encounter (Signed)
INFORMED PATIENT THAT RN IS NOT SURE IF THAT CAN FOUND OUT DURING SLEEP STUDY YOU USUALLY DREAM DURING REM PATTERN -NOT SURE HOW  PATIENT'S SLEEP PATTERN WAS DURING SLEEP STUDY. WILL DEFER TO DR [place]

## 2015-04-25 ENCOUNTER — Ambulatory Visit (INDEPENDENT_AMBULATORY_CARE_PROVIDER_SITE_OTHER): Payer: No Typology Code available for payment source | Admitting: Family Medicine

## 2015-04-25 ENCOUNTER — Encounter: Payer: Self-pay | Admitting: Family Medicine

## 2015-04-25 VITALS — BP 110/78 | HR 88 | Temp 97.5°F | Ht 64.0 in | Wt 183.0 lb

## 2015-04-25 DIAGNOSIS — K219 Gastro-esophageal reflux disease without esophagitis: Secondary | ICD-10-CM | POA: Diagnosis not present

## 2015-04-25 DIAGNOSIS — F419 Anxiety disorder, unspecified: Secondary | ICD-10-CM | POA: Diagnosis not present

## 2015-04-25 MED ORDER — LANSOPRAZOLE 30 MG PO CPDR: DELAYED_RELEASE_CAPSULE | ORAL | Status: DC

## 2015-04-25 NOTE — Assessment & Plan Note (Signed)
Continues to have anxiety. Experiencing most symptoms at night, and sleep study with documented nocturnal hypoxia. She has been unable to get the oxygen due to financial constraints. She continues to require Ativan. She has not had any for two weeks and says she is out of them, though I gave her a 45 day supply on 3/9 less than a month ago. PHQ -9 is 6.  - Will not refill ativan today due to questionable use, and primary issue being nocturnal hypoxia.  - Refer to psych as noted in previous note.  - Stop the zoloft.  - Blood chemistries reviewed. Cr slightly up, need to recheck at next appt.  - f/u in 6-8 weeks.

## 2015-04-25 NOTE — Progress Notes (Signed)
Patient ID: [name], female   DOB: 01-15-1961, 55 y.o.   MRN: [id number]   [facility] Aquilla Hacker, MD Phone: [phone]  Subjective:   # f/u Anxiety / Panic Attacks - No more panic attacks.  - Had an episode this morning, though when she woke up. She says when she sleeps more than a couple of hours she will wake up with your heart pounding and shaking all over.  - Taking the zoloft at this time. Once pill once daily.  - Has not had ativan in two weeks.  - She says she has been doing ok without it, though she relates the above complaint with waking up and heart pounding to not having the ativan.  - Had sleep study as well with nocturnal hypoxia, but had a significant copay for Oxygen and was unable to get this.  - feels that she is scared with the episodes noted above.    All relevant systems were reviewed and were negative unless otherwise noted in the HPI  Past Medical History Reviewed problem list.  Medications- reviewed and updated Current Outpatient Prescriptions  Medication Sig Dispense Refill  . allopurinol (ZYLOPRIM) 100 MG tablet Take 1 tablet (100 mg total) by mouth every morning. 60 tablet 5  . aspirin EC 81 MG tablet Take 81 mg by mouth daily.      . colchicine 0.6 MG tablet Take 1 tablet (0.6 mg total) by mouth daily as needed. 30 tablet 5  . cyclobenzaprine (FLEXERIL) 10 MG tablet Take 10 mg by mouth 2 (two) times daily.     Marland Kitchen gabapentin (NEURONTIN) 300 MG capsule Take 300 mg by mouth 3 (three) times daily.    . lansoprazole (PREVACID) 30 MG capsule TAKE 1 CAPSULE BY MOUTH EVERY DAY 90 capsule 0  . levothyroxine (SYNTHROID, LEVOTHROID) 75 MCG tablet Take 1 tablet (75 mcg total) by mouth daily. 90 tablet 4  . LORazepam (ATIVAN) 0.5 MG tablet Take 2 tablets (1 mg total) by mouth at bedtime as needed for anxiety or sleep. 90 tablet 0  . metoprolol succinate (TOPROL-XL) 25 MG 24 hr tablet Take 1 tablet (25 mg total) by mouth 2 (two) times  daily. 60 tablet 11  . MOVANTIK 25 MG TABS Take 1 tablet by mouth daily as needed (to use the bathroom).   0  . oxyCODONE (OXYCONTIN) 20 mg 12 hr tablet Take 20 mg by mouth every 12 (twelve) hours.    . sertraline (ZOLOFT) 50 MG tablet Take 1 tablet (50 mg total) by mouth daily. 30 tablet 0  . traZODone (DESYREL) 50 MG tablet TAKE 1/2 TO 1 TABLET BY MOUTH EVERY NIGHT AT BEDTIME AS NEEDED FOR SLEEP 90 tablet 5   No current facility-administered medications for this visit.   Chief complaint-noted No additions to family history Social history- patient is a current 1ppd smoker  Objective: BP 110/78 mmHg  Pulse 88  Temp(Src) 97.5 F (36.4 C) (Oral)  Ht 5\' 4"  (1.626 m)  Wt 183 lb (83.008 kg)  BMI 31.40 kg/m2  LMP 05/17/2009 Gen: NAD, alert, cooperative with exam HEENT: NCAT, EOMI, PERRL Neck: FROM, supple CV: RRR, good S1/S2, no murmur Resp: CTABL, no wheezes, non-labored Abd: SNTND, BS present, no guarding or organomegaly Ext: No edema, warm, normal tone, moves UE/LE spontaneously Neuro: Alert and oriented, No gross deficits Skin: no rashes no lesions  Assessment/Plan:  ANXIETY Continues to have anxiety. Experiencing most symptoms at night, and sleep study with documented nocturnal  hypoxia. She has been unable to get the oxygen due to financial constraints. She continues to require Ativan. She has not had any for two weeks and says she is out of them, though I gave her a 45 day supply on 3/9 less than a month ago. PHQ -9 is 6.  - Will not refill ativan today due to questionable use, and primary issue being nocturnal hypoxia.  - Refer to psych as noted in previous note.  - Stop the zoloft.  - Blood chemistries reviewed. Cr slightly up, need to recheck at next appt.  - f/u in 6-8 weeks.

## 2015-04-25 NOTE — Patient Instructions (Signed)
Thanks for coming in today.   Let's have you see psychiatry to help evaluate the symptoms that you are having.   Stop the zoloft.   Wait until you see Psychiatry to decide whether you need to go back on the ativan or not.   Return for follow up in 1-2 months.   Sincerely, Paula Compton, MD Family Medicine -PGY 2

## 2015-04-26 ENCOUNTER — Other Ambulatory Visit: Payer: Self-pay | Admitting: *Deleted

## 2015-04-26 DIAGNOSIS — F410 Panic disorder [episodic paroxysmal anxiety] without agoraphobia: Secondary | ICD-10-CM

## 2015-04-26 DIAGNOSIS — F430 Acute stress reaction: Principal | ICD-10-CM

## 2015-04-26 NOTE — Telephone Encounter (Signed)
Spoke with her at her visit yesterday about this medicine. Will not refill. I had given her a 90 day supply at her visit less than a month ago, and she says she has been out for two weeks. We are referring to psych for further management. Thanks  CGM MD

## 2015-04-27 ENCOUNTER — Other Ambulatory Visit: Payer: Self-pay | Admitting: *Deleted

## 2015-04-27 DIAGNOSIS — F430 Acute stress reaction: Principal | ICD-10-CM

## 2015-04-27 DIAGNOSIS — F410 Panic disorder [episodic paroxysmal anxiety] without agoraphobia: Secondary | ICD-10-CM

## 2015-05-11 ENCOUNTER — Ambulatory Visit (INDEPENDENT_AMBULATORY_CARE_PROVIDER_SITE_OTHER): Payer: Medicare Other | Admitting: Family Medicine

## 2015-05-11 ENCOUNTER — Encounter: Payer: Self-pay | Admitting: Family Medicine

## 2015-05-11 VITALS — BP 119/90 | HR 105 | Temp 98.2°F | Wt 183.0 lb

## 2015-05-11 DIAGNOSIS — W19XXXA Unspecified fall, initial encounter: Secondary | ICD-10-CM | POA: Diagnosis not present

## 2015-05-11 DIAGNOSIS — R413 Other amnesia: Secondary | ICD-10-CM

## 2015-05-11 DIAGNOSIS — Y92009 Unspecified place in unspecified non-institutional (private) residence as the place of occurrence of the external cause: Principal | ICD-10-CM

## 2015-05-11 DIAGNOSIS — G4452 New daily persistent headache (NDPH): Secondary | ICD-10-CM | POA: Diagnosis not present

## 2015-05-11 DIAGNOSIS — Y92099 Unspecified place in other non-institutional residence as the place of occurrence of the external cause: Secondary | ICD-10-CM | POA: Diagnosis not present

## 2015-05-11 NOTE — Patient Instructions (Signed)
Fall Prevention in the Home  Falls can cause injuries and can affect people from all age groups. There are many simple things that you can do to make your home safe and to help prevent falls. WHAT CAN I DO ON THE OUTSIDE OF MY HOME?  Regularly repair the edges of walkways and driveways and fix any cracks.  Remove high doorway thresholds.  Trim any shrubbery on the main path into your home.  Use bright outdoor lighting.  Clear walkways of debris and clutter, including tools and rocks.  Regularly check that handrails are securely fastened and in good repair. Both sides of any steps should have handrails.  Install guardrails along the edges of any raised decks or porches.  Have leaves, snow, and ice cleared regularly.  Use sand or salt on walkways during winter months.  In the garage, clean up any spills right away, including grease or oil spills. WHAT CAN I DO IN THE BATHROOM?  Use night lights.  Install grab bars by the toilet and in the tub and shower. Do not use towel bars as grab bars.  Use non-skid mats or decals on the floor of the tub or shower.  If you need to sit down while you are in the shower, use a plastic, non-slip stool..  Keep the floor dry. Immediately clean up any water that spills on the floor.  Remove soap buildup in the tub or shower on a regular basis.  Attach bath mats securely with double-sided non-slip rug tape.  Remove throw rugs and other tripping hazards from the floor. WHAT CAN I DO IN THE BEDROOM?  Use night lights.  Make sure that a bedside light is easy to reach.  Do not use oversized bedding that drapes onto the floor.  Have a firm chair that has side arms to use for getting dressed.  Remove throw rugs and other tripping hazards from the floor. WHAT CAN I DO IN THE KITCHEN?   Clean up any spills right away.  Avoid walking on wet floors.  Place frequently used items in easy-to-reach places.  If you need to reach for something  above you, use a sturdy step stool that has a grab bar.  Keep electrical cables out of the way.  Do not use floor polish or wax that makes floors slippery. If you have to use wax, make sure that it is non-skid floor wax.  Remove throw rugs and other tripping hazards from the floor. WHAT CAN I DO IN THE STAIRWAYS?  Do not leave any items on the stairs.  Make sure that there are handrails on both sides of the stairs. Fix handrails that are broken or loose. Make sure that handrails are as long as the stairways.  Check any carpeting to make sure that it is firmly attached to the stairs. Fix any carpet that is loose or worn.  Avoid having throw rugs at the top or bottom of stairways, or secure the rugs with carpet tape to prevent them from moving.  Make sure that you have a light switch at the top of the stairs and the bottom of the stairs. If you do not have them, have them installed. WHAT ARE SOME OTHER FALL PREVENTION TIPS?  Wear closed-toe shoes that fit well and support your feet. Wear shoes that have rubber soles or low heels.  When you use a stepladder, make sure that it is completely opened and that the sides are firmly locked. Have someone hold the ladder while you   are using it. Do not climb a closed stepladder.  Add color or contrast paint or tape to grab bars and handrails in your home. Place contrasting color strips on the first and last steps.  Use mobility aids as needed, such as canes, walkers, scooters, and crutches.  Turn on lights if it is dark. Replace any light bulbs that burn out.  Set up furniture so that there are clear paths. Keep the furniture in the same spot.  Fix any uneven floor surfaces.  Choose a carpet design that does not hide the edge of steps of a stairway.  Be aware of any and all pets.  Review your medicines with your healthcare provider. Some medicines can cause dizziness or changes in blood pressure, which increase your risk of falling. Talk  with your health care provider about other ways that you can decrease your risk of falls. This may include working with a physical therapist or trainer to improve your strength, balance, and endurance.   This information is not intended to replace advice given to you by your health care provider. Make sure you discuss any questions you have with your health care provider.   Document Released: 12/28/2001 Document Revised: 05/24/2014 Document Reviewed: 02/11/2014 Elsevier Interactive Patient Education 2016 Elsevier Inc.  

## 2015-05-11 NOTE — Progress Notes (Signed)
    Subjective:    Patient ID: [name] is a 55 y.o. female presenting with Fall  on 05/11/2015  HPI: [place] 2 days ago. Husband found her a 5 am, had gone to the bathroom at 3. She could not remember what happened. Stated she fell and had some issues with bruising and cuts on her face where she hit her face with her glasses. Feels like she wrenched her back and has left sided rib pain. Reports headache and continued pain. Is worried about memory loss. Is here to inform us of the fall. She is treated by pain medicine and is on a lot of central nervous system acting agents.  Review of Systems  Constitutional: Negative for fever and chills.  Respiratory: Negative for shortness of breath.   Cardiovascular: Negative for chest pain.  Gastrointestinal: Negative for nausea, vomiting and abdominal pain.  Genitourinary: Negative for dysuria.  Skin: Negative for rash.      Objective:    BP 119/90 mmHg  Pulse 105  Temp(Src) 98.2 F (36.8 C) (Oral)  Wt 183 lb (83.008 kg)  LMP 05/17/2009 Physical Exam  Constitutional: She is oriented to person, place, and time. She appears well-developed and well-nourished. No distress.  HENT:  Head: Normocephalic and atraumatic.  Eyes: No scleral icterus.  Neck: Neck supple.  Cardiovascular: Normal rate.   Pulmonary/Chest: Effort normal.  Abdominal: Soft.  Musculoskeletal: She exhibits tenderness (midline, low back and left lower rib cage).  Neurological: She is alert and oriented to person, place, and time. She has normal strength. She displays normal reflexes. No sensory deficit. Coordination normal.  Skin: Skin is warm and dry.  Do not see any bruising on her face  Psychiatric: She has a normal mood and affect.        Assessment & Plan:   Problem List Items Addressed This Visit    None    Visit Diagnoses    Fall at home, initial encounter    -  Primary    discussed fall prevention at length--fall proofing home, concerns around falls.    Relevant Orders    CT Head Wo Contrast    New daily persistent headache        will check CT of the head--though given non-focal exam--doubt any issues    Relevant Orders    CT Head Wo Contrast    Memory loss, short term        Relevant Orders    CT Head Wo Contrast       Total face-to-face time with patient: 25 minutes. Over 50% of encounter was spent on counseling and coordination of care. Return in about 4 weeks (around 06/08/2015).  [name] 05/11/2015 4:19 PM

## 2015-05-12 ENCOUNTER — Encounter: Payer: Self-pay | Admitting: *Deleted

## 2015-05-12 NOTE — Progress Notes (Unsigned)
Patient came in with new FMLA papers for her husband.  Needs provider to fill them out so he won't lose his job.  Her husband is her main source of transportation to and from her doctors appoitments.  Will place forms in providers box. Jensyn Shave,CMA

## 2015-05-15 ENCOUNTER — Ambulatory Visit (HOSPITAL_COMMUNITY): Payer: No Typology Code available for payment source

## 2015-05-15 ENCOUNTER — Telehealth: Payer: Self-pay | Admitting: *Deleted

## 2015-05-15 NOTE — Telephone Encounter (Signed)
Patietn has CT scheduled today but Pam with pre-service center states that more clinical info needs to be called to [employer] (Phone #[phone], Ref# [facility].) or they will have to cancel appointment. Contact Pam with auth # at [id number]. Tia out of office will forward to team.

## 2015-05-15 NOTE — Telephone Encounter (Signed)
LM with patient information and my number for [employer] to contact me for additional information. Lucita Montoya,CMA

## 2015-05-15 NOTE — Telephone Encounter (Signed)
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 Any questions should be directed to [place] at [place].

## 2015-05-17 ENCOUNTER — Ambulatory Visit (HOSPITAL_COMMUNITY)
Admission: RE | Admit: 2015-05-17 | Discharge: 2015-05-17 | Disposition: A | Discharge status: Home / Self Care | Payer: No Typology Code available for payment source | Source: Ambulatory Visit | Attending: Family Medicine | Admitting: Family Medicine

## 2015-05-17 DIAGNOSIS — R413 Other amnesia: Secondary | ICD-10-CM | POA: Diagnosis not present

## 2015-05-17 DIAGNOSIS — G4452 New daily persistent headache (NDPH): Secondary | ICD-10-CM | POA: Diagnosis not present

## 2015-05-17 DIAGNOSIS — W19XXXA Unspecified fall, initial encounter: Secondary | ICD-10-CM | POA: Insufficient documentation

## 2015-05-17 DIAGNOSIS — Y92099 Unspecified place in other non-institutional residence as the place of occurrence of the external cause: Secondary | ICD-10-CM | POA: Insufficient documentation

## 2015-05-17 DIAGNOSIS — R51 Headache: Secondary | ICD-10-CM | POA: Diagnosis not present

## 2015-05-17 DIAGNOSIS — Y92009 Unspecified place in unspecified non-institutional (private) residence as the place of occurrence of the external cause: Secondary | ICD-10-CM

## 2015-05-17 NOTE — Progress Notes (Unsigned)
Pt calls to check status.  She states the be sure he knows it is intermittent FMLA. Matheus Spiker, Salome Spotted, CMA

## 2015-05-18 ENCOUNTER — Telehealth: Payer: Self-pay | Admitting: *Deleted

## 2015-05-18 NOTE — Progress Notes (Signed)
Will forward to MD to make aware. Deedra Pro,CMA  

## 2015-05-18 NOTE — Telephone Encounter (Signed)
LM ok per DPR with negative results.  Patient already has a follow up with pcp in may and will need to keep it to ensure symptoms have resolved. Aneth Schlagel,CMA

## 2015-05-18 NOTE — Progress Notes (Unsigned)
Dr. Isac Sarna last day in clinic was 4/21. He will be back on Monday. Looking over her records I cannot find a specific reason for why he would need the intermittent FMLA and I cannot find where we've completed this before. Will defer to Dr. Minda Ditto when he returns on Monday.  Thanks, Archie Patten, MD

## 2015-05-18 NOTE — Telephone Encounter (Signed)
-----   Message from Donnamae Jude, MD sent at 05/18/2015 11:30 AM EDT ----- Her head CT is negative.

## 2015-05-22 HISTORY — PX: NM MYOCAR PERF WALL MOTION: HXRAD629

## 2015-05-25 ENCOUNTER — Telehealth: Payer: Self-pay | Admitting: *Deleted

## 2015-05-25 NOTE — Telephone Encounter (Signed)
Spoke with patient today regarding FMLA. I have had this conversation with her in the office. She is requesting that I fill out her FMLA paperwork dating back to 2012. I am unable to do this as I have been treating her only since October for Anxiety. I had already filled out FMLA paperwork for her previously documenting her episodes of panic attack and the need for her husband to stay home with her at times and transport her to and from our office. Apparently, this FMLA was denied. She is very upset about this situation, and feels that we should be able to provide her FMLA for October to now, and also dating back to 2012. During our phone conversation today she became very upset whenever I told her that I was happy to fill it out from October on, but unfortunately could not do so for the period of time that she had requested. She said that "if you are not able to look back and see all of the things that I have been through and give me FMLA for them, then there is something wrong there and you aren't doing your job". She asked to speak with a Charity fundraiser". She is again very upset. I tried to calm her down and told her I was sorry that she was upset. I told her that I am, again, happy to do what I can to help from October on, but Anxiety and smoking cessation / palpitations were the only things that I have been treating her for. She said that she would come in Monday to discuss further. I am unsure what this means. I am happy to speak with her again if she is willing.   Paula Compton, MD Family Medicine -PGY 2

## 2015-05-25 NOTE — Telephone Encounter (Signed)
Patient calling again checking on status of forms, would like a call back today from manager or MD.

## 2015-06-01 ENCOUNTER — Encounter: Payer: Self-pay | Admitting: Family Medicine

## 2015-06-01 ENCOUNTER — Observation Stay (HOSPITAL_COMMUNITY)
Admission: EM | Admit: 2015-06-01 | Discharge: 2015-06-02 | Disposition: A | Discharge status: Home / Self Care | Payer: No Typology Code available for payment source | Attending: Family Medicine | Admitting: Family Medicine

## 2015-06-01 ENCOUNTER — Encounter (HOSPITAL_COMMUNITY): Payer: Self-pay | Admitting: Nurse Practitioner

## 2015-06-01 ENCOUNTER — Ambulatory Visit (HOSPITAL_COMMUNITY)
Admission: RE | Admit: 2015-06-01 | Discharge: 2015-06-01 | Disposition: A | Discharge status: Home / Self Care | Payer: No Typology Code available for payment source | Source: Ambulatory Visit | Attending: Family Medicine | Admitting: Family Medicine

## 2015-06-01 ENCOUNTER — Other Ambulatory Visit: Payer: Self-pay

## 2015-06-01 ENCOUNTER — Ambulatory Visit (INDEPENDENT_AMBULATORY_CARE_PROVIDER_SITE_OTHER): Payer: No Typology Code available for payment source | Admitting: Family Medicine

## 2015-06-01 ENCOUNTER — Emergency Department (HOSPITAL_COMMUNITY): Payer: No Typology Code available for payment source

## 2015-06-01 VITALS — BP 146/101 | HR 87 | Temp 98.7°F | Wt 187.7 lb

## 2015-06-01 DIAGNOSIS — Z91040 Latex allergy status: Secondary | ICD-10-CM | POA: Insufficient documentation

## 2015-06-01 DIAGNOSIS — R079 Chest pain, unspecified: Secondary | ICD-10-CM

## 2015-06-01 DIAGNOSIS — Z79899 Other long term (current) drug therapy: Secondary | ICD-10-CM | POA: Insufficient documentation

## 2015-06-01 DIAGNOSIS — M1990 Unspecified osteoarthritis, unspecified site: Secondary | ICD-10-CM | POA: Insufficient documentation

## 2015-06-01 DIAGNOSIS — E785 Hyperlipidemia, unspecified: Secondary | ICD-10-CM | POA: Insufficient documentation

## 2015-06-01 DIAGNOSIS — G894 Chronic pain syndrome: Secondary | ICD-10-CM | POA: Diagnosis not present

## 2015-06-01 DIAGNOSIS — M810 Age-related osteoporosis without current pathological fracture: Secondary | ICD-10-CM | POA: Insufficient documentation

## 2015-06-01 DIAGNOSIS — D689 Coagulation defect, unspecified: Secondary | ICD-10-CM | POA: Diagnosis present

## 2015-06-01 DIAGNOSIS — I10 Essential (primary) hypertension: Secondary | ICD-10-CM | POA: Diagnosis present

## 2015-06-01 DIAGNOSIS — F17210 Nicotine dependence, cigarettes, uncomplicated: Secondary | ICD-10-CM | POA: Diagnosis not present

## 2015-06-01 DIAGNOSIS — M459 Ankylosing spondylitis of unspecified sites in spine: Secondary | ICD-10-CM | POA: Diagnosis not present

## 2015-06-01 DIAGNOSIS — Z720 Tobacco use: Secondary | ICD-10-CM | POA: Diagnosis present

## 2015-06-01 DIAGNOSIS — F329 Major depressive disorder, single episode, unspecified: Secondary | ICD-10-CM | POA: Insufficient documentation

## 2015-06-01 DIAGNOSIS — N183 Chronic kidney disease, stage 3 unspecified: Secondary | ICD-10-CM | POA: Diagnosis present

## 2015-06-01 DIAGNOSIS — I129 Hypertensive chronic kidney disease with stage 1 through stage 4 chronic kidney disease, or unspecified chronic kidney disease: Secondary | ICD-10-CM | POA: Insufficient documentation

## 2015-06-01 DIAGNOSIS — R0789 Other chest pain: Principal | ICD-10-CM | POA: Insufficient documentation

## 2015-06-01 DIAGNOSIS — R072 Precordial pain: Secondary | ICD-10-CM

## 2015-06-01 DIAGNOSIS — E669 Obesity, unspecified: Secondary | ICD-10-CM | POA: Diagnosis not present

## 2015-06-01 DIAGNOSIS — E039 Hypothyroidism, unspecified: Secondary | ICD-10-CM | POA: Insufficient documentation

## 2015-06-01 DIAGNOSIS — E7800 Pure hypercholesterolemia, unspecified: Secondary | ICD-10-CM | POA: Diagnosis present

## 2015-06-01 DIAGNOSIS — F419 Anxiety disorder, unspecified: Secondary | ICD-10-CM | POA: Diagnosis not present

## 2015-06-01 DIAGNOSIS — Z7982 Long term (current) use of aspirin: Secondary | ICD-10-CM | POA: Diagnosis not present

## 2015-06-01 DIAGNOSIS — Z87442 Personal history of urinary calculi: Secondary | ICD-10-CM | POA: Diagnosis not present

## 2015-06-01 DIAGNOSIS — F411 Generalized anxiety disorder: Secondary | ICD-10-CM | POA: Diagnosis present

## 2015-06-01 DIAGNOSIS — K219 Gastro-esophageal reflux disease without esophagitis: Secondary | ICD-10-CM | POA: Insufficient documentation

## 2015-06-01 LAB — CBC
HEMATOCRIT: 52.9 % — AB (ref 36.0–46.0)
HEMOGLOBIN: 17.4 g/dL — AB (ref 12.0–15.0)
MCH: 30.4 pg (ref 26.0–34.0)
MCHC: 32.9 g/dL (ref 30.0–36.0)
MCV: 92.5 fL (ref 78.0–100.0)
Platelets: 261 10*3/uL (ref 150–400)
RBC: 5.72 MIL/uL — ABNORMAL HIGH (ref 3.87–5.11)
RDW: 16.9 % — AB (ref 11.5–15.5)
WBC: 11.5 10*3/uL — AB (ref 4.0–10.5)

## 2015-06-01 LAB — BASIC METABOLIC PANEL
ANION GAP: 10 (ref 5–15)
BUN: 21 mg/dL — AB (ref 6–20)
CHLORIDE: 98 mmol/L — AB (ref 101–111)
CO2: 31 mmol/L (ref 22–32)
Calcium: 9.2 mg/dL (ref 8.9–10.3)
Creatinine, Ser: 1.36 mg/dL — ABNORMAL HIGH (ref 0.44–1.00)
GFR calc Af Amer: 50 mL/min — ABNORMAL LOW (ref >60)
GFR calc non Af Amer: 43 mL/min — ABNORMAL LOW (ref >60)
GLUCOSE: 110 mg/dL — AB (ref 65–99)
POTASSIUM: 4.9 mmol/L (ref 3.5–5.1)
Sodium: 139 mmol/L (ref 135–145)

## 2015-06-01 LAB — I-STAT TROPONIN, ED: Troponin i, poc: 0 ng/mL (ref 0.00–0.08)

## 2015-06-01 LAB — TROPONIN I

## 2015-06-01 MED ORDER — ALLOPURINOL 100 MG PO TABS
100.0000 mg | ORAL_TABLET | Freq: Every morning | ORAL | Status: DC
  Administered 2015-06-02: 100 mg via ORAL
  Filled 2015-06-01: qty 1

## 2015-06-01 MED ORDER — OXYCODONE HCL ER 10 MG PO T12A
20.0000 mg | EXTENDED_RELEASE_TABLET | Freq: Two times a day (BID) | ORAL | Status: DC
  Administered 2015-06-01 – 2015-06-02 (×2): 20 mg via ORAL
  Filled 2015-06-01 (×2): qty 2

## 2015-06-01 MED ORDER — METOPROLOL TARTRATE 25 MG PO TABS
25.0000 mg | ORAL_TABLET | Freq: Two times a day (BID) | ORAL | Status: DC
  Administered 2015-06-01 – 2015-06-02 (×2): 25 mg via ORAL
  Filled 2015-06-01 (×2): qty 1

## 2015-06-01 MED ORDER — ACETAMINOPHEN 325 MG PO TABS: 650.0000 mg | ORAL_TABLET | ORAL | Status: DC | PRN

## 2015-06-01 MED ORDER — TRAZODONE HCL 50 MG PO TABS
25.0000 mg | ORAL_TABLET | Freq: Every day | ORAL | Status: DC
  Administered 2015-06-01: 50 mg via ORAL
  Filled 2015-06-01: qty 1

## 2015-06-01 MED ORDER — LEVOTHYROXINE SODIUM 75 MCG PO TABS
75.0000 ug | ORAL_TABLET | Freq: Every day | ORAL | Status: DC
  Administered 2015-06-02: 75 ug via ORAL
  Filled 2015-06-01: qty 1

## 2015-06-01 MED ORDER — PANTOPRAZOLE SODIUM 40 MG PO TBEC
40.0000 mg | DELAYED_RELEASE_TABLET | Freq: Every day | ORAL | Status: DC
  Administered 2015-06-02: 40 mg via ORAL
  Filled 2015-06-01: qty 1

## 2015-06-01 MED ORDER — BACLOFEN 10 MG PO TABS: 10.0000 mg | ORAL_TABLET | Freq: Two times a day (BID) | ORAL | Status: DC | PRN

## 2015-06-01 MED ORDER — ENOXAPARIN SODIUM 40 MG/0.4ML ~~LOC~~ SOLN
40.0000 mg | SUBCUTANEOUS | Status: DC
  Administered 2015-06-01: 40 mg via SUBCUTANEOUS
  Filled 2015-06-01: qty 0.4

## 2015-06-01 MED ORDER — GABAPENTIN 300 MG PO CAPS
300.0000 mg | ORAL_CAPSULE | Freq: Three times a day (TID) | ORAL | Status: DC
  Administered 2015-06-01 – 2015-06-02 (×3): 300 mg via ORAL
  Filled 2015-06-01 (×3): qty 1

## 2015-06-01 MED ORDER — ASPIRIN EC 325 MG PO TBEC
325.0000 mg | DELAYED_RELEASE_TABLET | Freq: Once | ORAL | Status: AC
  Administered 2015-06-01: 325 mg via ORAL
  Filled 2015-06-01: qty 1

## 2015-06-01 MED ORDER — ONDANSETRON HCL 4 MG/2ML IJ SOLN
4.0000 mg | Freq: Four times a day (QID) | INTRAMUSCULAR | Status: DC | PRN
  Administered 2015-06-02: 4 mg via INTRAVENOUS
  Filled 2015-06-01: qty 2

## 2015-06-01 MED ORDER — ASPIRIN EC 325 MG PO TBEC
325.0000 mg | DELAYED_RELEASE_TABLET | Freq: Every day | ORAL | Status: DC
  Administered 2015-06-02: 325 mg via ORAL
  Filled 2015-06-01: qty 1

## 2015-06-01 MED ORDER — NICOTINE 14 MG/24HR TD PT24
14.0000 mg | MEDICATED_PATCH | Freq: Every day | TRANSDERMAL | Status: DC
Start: 2015-06-01 — End: 2015-06-02
  Administered 2015-06-01 – 2015-06-02 (×2): 14 mg via TRANSDERMAL
  Filled 2015-06-01 (×2): qty 1

## 2015-06-01 NOTE — H&P (Signed)
[facility] Admission History and Physical Service Pager: [phone]  Patient name: [name] Medical record number: [id number] Date of birth: 1961/01/02 Age: 55 y.o. Gender: female  Primary Care Provider: Paula Compton, MD Consultants: Cards in am Code Status: FULL (discussed on admission)  Chief Complaint: chest pain  Assessment and Plan: [name] is a 55 y.o. female presenting with chest pain . PMH is significant for chronic chest pain, HTN, HLD, Tobacco abuse, Obesity, CKD 3, anxiety, hypothyroidism, back pain  # Chest pain/ Atypical angina:  Heart score 5.  EKG nonischemic, iTrop negative.   Diastolic BP slightly elevated to 100 in ED.  VS otherwise stable.  Apparently, has had a long standing history of CP.  She is seen by Dr Ellyn Hack outpatient.  They have discussed stress testing her on a couple of occassions but patient has declined in the past.  She is agreeable during this hospitalization.  She is on Toprol XL 25 BID (discussed with pharmacy, this is indeed how this is being prescribed).  - Place in observation under Dr Ardelia Mems - Telemetry - VS per floor protocol - trend troponin - TSH, lipid, A1c ordered - BMP, CBC in am - UDS ordered - EKG in am - ASA 325mg   - Zofran prn - Home pain meds ordered - Will change Metop Succinate to Metoprolol tartrate 25mg  BID while hospitalized and verify dosing with cards in am. - Consider c/s cards in am. ?outpatient stress test - O2 prn saturation >92%.  Wears 2L O2 [place] qhs.  This has been ordered. - Not on a statin, will calculate ASCVD risk once lipid panel back  # HTN: BP 146/101 in ED.  On Toprol XL 25mg  BID - Metoprolol tartrate while hospitalized  # HLD; not on statin - lipid panel - calc ASCVD risk  # Hypothyroidism: last TSH 01/2014 2.937.  On synthroid 75 mcg at home - continue home dose - check TSH  # CKD 3: Cr 1.36 in ED.  Baseline 1.22-1.25 - Avoid nephrotoxic agents - BMP in  am  # Tobacco use disorder: smokes 1/2-3/4 ppd of light cigs - Nicotine 13mcg patch - smoking cessation counseling  # Anxiety: Previously on Xanax and Zoloft.  Recently dc'd 2/2 adverse side effects.  Takes Trazodone 25-50mg  qhs for sleep - continue trazodone  # Back pain: sees pain clinic.  On Gabapentin, Oxy ER, Baclofen - Continue home meds while hospitalized   FEN/GI: HH diet, PPI Prophylaxis: Lovenox sub-q  Disposition: Obs for ACS r/p  History of Present Illness:  [name] is a 55 y.o. female presenting with chest pain  Patient notes a 6 month history of chest pain.  She reports that CP normally comes and goes.  She notes that CP this time started 3 days ago.  She reports nausea and vomiting with CP.  CP is constant and left sided.  Sharp in nature.  Was pressure like and substernal 2 days ago.  She reports that she sees a cardiologist (Dr Ellyn Hack) and that they have not found a reason for her CP but that they have adjusted doses of her BP medications.  She reports intermittent diaphoresis, blurry vision (with position changes).  Endorses SOB intermittently.  No abdominal pain.  Had 1 loose stool last evening.  No sick contacts.  No fevers, chills.    Uses 2L O2 at night time per Cardiology.  She lives at home with her husband and is completely independent in her ADLs.  Has taken all her am doses of meds.  Takes ASA 81 at night time.  Review Of Systems: Per HPI with the following additions: none Otherwise the remainder of the systems were negative.  Patient Active Problem List   Diagnosis Date Noted  . Chest pain 06/01/2015  . Encounter for smoking cessation counseling 11/29/2014  . Skin lesion 03/18/2014  . Headache 02/08/2014  . Tobacco abuse 02/08/2014  . Myalgia 07/22/2013  . Vertigo 05/12/2013  . Hip numbness 05/12/2013  . Gout 05/12/2013  . Ringing in ears 04/10/2013  . Obesity (BMI 30-39.9) 12/31/2012  . Palpitations 06/10/2012  . Chest pain, musculoskeletal  06/10/2012  . Hot flashes 06/22/2011  . Coagulopathy ([place]) 12/14/2010  . Abnormal ECG 09/17/2010  . Vitamin D deficiency 05/18/2010  . Nonallopathic lesion of cervicothoracic region 05/18/2010  . ASCUS PAP 06/07/2008  . Chronic kidney disease (CKD), stage III (moderate) 06/10/2007  . Hypothyroidism 03/20/2006  . HYPERCHOLESTEROLEMIA 03/20/2006  . DEPRESSION, MAJOR, RECURRENT 03/20/2006  . Anxiety state 03/20/2006  . Essential hypertension 03/20/2006  . PEPTIC ULCER DIS., UNSPEC. W/O OBSTRUCTION 03/20/2006  . 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 Ankylosing spondylitis ([place]) 03/20/2006  . OSTEOPOROSIS, UNSPECIFIED 03/20/2006  . SCOLIOSIS 03/20/2006  . INSOMNIA NOS 03/20/2006    Past Medical History: Past Medical History  Diagnosis Date  . Allergy   . Arthritis   . Depression   . GERD (gastroesophageal reflux disease)   . Hyperlipidemia   . Hypertension   . Osteoporosis   . Hypothyroidism   . Anxiety   . Chronic pain syndrome   . Tobacco abuse   . Obesity   . Gastroparesis   . Ankylosing spondylitis ([place])   . Depression   . CKD (chronic kidney disease) stage 3, GFR 30-59 ml/min   . Nephrolithiasis     Past Surgical History: Past Surgical History  Procedure Laterality Date  . Total knee arthroplasty      left  . Back surgery    . Neck surgery    . Appendectomy    . Cholecystectomy    . Right knee arthroplasty    . Doppler echocardiography  09/26/2010    EF=>55%, LV norm  . Nm myocar perf wall motion  09/26/2010    Lexiscan EF 83%  EF may be overestimated due to LVH    Social History: Social History  Substance Use Topics  . Smoking status: Current Every Day Smoker -- 1.00 packs/day    Types: Cigarettes    Last Attempt to Quit: 10/12/2010  . Smokeless tobacco: None  . Alcohol Use: No   Additional social history: smokes 1/2-3/4 ppd, no drugs, no ETOH  Please also refer to relevant sections of EMR.  Family History: Family History  Problem  Relation Age of Onset  . Diabetes Mother   . Heart failure Mother   . Hyperlipidemia Mother   . Hypertension Mother   . Hyperlipidemia Father   . Cancer Son     lymphoma stage 4 at 30   Allergies and Medications: Allergies  Allergen Reactions  . Codeine Nausea And Vomiting    "Will stop her heart"  . Latex Itching  . Acyclovir And Related Itching and Rash   No current facility-administered medications on file prior to encounter.   Current Outpatient Prescriptions on File Prior to Encounter  Medication Sig Dispense Refill  . allopurinol (ZYLOPRIM) 100 MG tablet Take 1 tablet (100 mg total) by mouth every morning. 60 tablet 5  . aspirin EC  81 MG tablet Take 81 mg by mouth daily.      . colchicine 0.6 MG tablet Take 1 tablet (0.6 mg total) by mouth daily as needed. 30 tablet 5  . gabapentin (NEURONTIN) 300 MG capsule Take 300 mg by mouth 3 (three) times daily.    . lansoprazole (PREVACID) 30 MG capsule TAKE 1 CAPSULE BY MOUTH EVERY DAY 90 capsule 3  . levothyroxine (SYNTHROID, LEVOTHROID) 75 MCG tablet Take 1 tablet (75 mcg total) by mouth daily. 90 tablet 4  . LORazepam (ATIVAN) 0.5 MG tablet Take 2 tablets (1 mg total) by mouth at bedtime as needed for anxiety or sleep. 90 tablet 0  . metoprolol succinate (TOPROL-XL) 25 MG 24 hr tablet Take 1 tablet (25 mg total) by mouth 2 (two) times daily. 60 tablet 11  . MOVANTIK 25 MG TABS Take 1 tablet by mouth daily as needed (to use the bathroom).   0  . oxyCODONE (OXYCONTIN) 20 mg 12 hr tablet Take 20 mg by mouth every 12 (twelve) hours.    . traZODone (DESYREL) 50 MG tablet TAKE 1/2 TO 1 TABLET BY MOUTH EVERY NIGHT AT BEDTIME AS NEEDED FOR SLEEP 90 tablet 5    Objective: BP 137/97 mmHg  Pulse 72  Temp(Src) 97.3 F (36.3 C) (Oral)  Resp 18  SpO2 100%  LMP 05/17/2009 Exam: General: awake, alert, obese female, NAD Eyes: EOMI, wears glasses, PERRL ENTM: o/p clear, poor dentition, MMM Neck: supple, no JVD Cardiovascular: RRR, no  murmurs Respiratory: CTAB, no wheeze, air movement fair Abdomen: obese, soft, NT, +BS MSK: no edema, moves all extremities independently Skin: multiple tattoos on back but no rashes or lesions Neuro: follows commands, no focal deficits Psych: mood stable, speech normal  Labs and Imaging: Results for orders placed or performed during the hospital encounter of 06/01/15 (from the past 24 hour(s))  Basic metabolic panel     Status: Abnormal   Collection Time: 06/01/15  5:27 PM  Result Value Ref Range   Sodium 139 135 - 145 mmol/L   Potassium 4.9 3.5 - 5.1 mmol/L   Chloride 98 (L) 101 - 111 mmol/L   CO2 31 22 - 32 mmol/L   Glucose, Bld 110 (H) 65 - 99 mg/dL   BUN 21 (H) 6 - 20 mg/dL   Creatinine, Ser 1.36 (H) 0.44 - 1.00 mg/dL   Calcium 9.2 8.9 - 10.3 mg/dL   GFR calc non Af Amer 43 (L) >60 mL/min   GFR calc Af Amer 50 (L) >60 mL/min   Anion gap 10 5 - 15  CBC     Status: Abnormal   Collection Time: 06/01/15  5:27 PM  Result Value Ref Range   WBC 11.5 (H) 4.0 - 10.5 K/uL   RBC 5.72 (H) 3.87 - 5.11 MIL/uL   Hemoglobin 17.4 (H) 12.0 - 15.0 g/dL   HCT 52.9 (H) 36.0 - 46.0 %   MCV 92.5 78.0 - 100.0 fL   MCH 30.4 26.0 - 34.0 pg   MCHC 32.9 30.0 - 36.0 g/dL   RDW 16.9 (H) 11.5 - 15.5 %   Platelets 261 150 - 400 K/uL  I-stat troponin, ED     Status: None   Collection Time: 06/01/15  5:34 PM  Result Value Ref Range   Troponin i, poc 0.00 0.00 - 0.08 ng/mL   Comment 3           Dg Chest 2 View  06/01/2015  CLINICAL DATA:  Chest pain for 4  days EXAM: CHEST  2 VIEW COMPARISON:  01/17/2015 FINDINGS: Rounded lucent opacities over both upper lobes suggest EKG leads. Heart size and vascular pattern normal. Lungs clear. No effusion or pneumothorax. Mild sigmoid scoliotic curvature thoracolumbar spine similar to prior study. IMPRESSION: No acute findings Electronically Signed   By: Skipper Cliche M.D.   On: 06/01/2015 18:31    Janora Norlander, DO 06/01/2015, 8:42 PM PGY-2, [place] [facility] pager: [phone], text pages welcome

## 2015-06-01 NOTE — ED Notes (Signed)
She c/o 4 day history of CP. She went to PCP and they sent her to ED for further evaluation of ekg changes in office today. She describes pain as a sharp pressure above L breast radiating down L arm. She reports, nausea, dizziness.she is alert and breathing easily

## 2015-06-01 NOTE — Progress Notes (Signed)
Patient was educated on safety plan and fall precautions. Patient refused to have bed alarm on for the night. [facility] Rn

## 2015-06-01 NOTE — Patient Instructions (Signed)
Please go to the ED for evaluation.   You will need to be worked up for Ischemic heart disease.   You may be having a heart attack.    We will see you soon.   Sincerely, Paula Compton, MD Family Medicine -PGY 2

## 2015-06-01 NOTE — Progress Notes (Signed)
Patient ID: [name], female   DOB: 02-11-1960, 55 y.o.   MRN: [id number]    [facility] Aquilla Hacker, MD Phone: [phone]  Subjective:   # Chest Pain  - chest pain that has been going on for months.  - Has never gone away?  - Fluctuates in intensity. Never completely goes away.  - She says she has intermittent nausea.  - Has been sick with nausea / vomiting over the past 3 days.  - Has some back pain as well.  - she says she has had left arm pain as well for months. Arm pain occurs with chest pain at times.  - She say she has some numbness in her jaw.  - She says she is clammy / sweating at times.  - She says that she has shortness of breath and that this is worse with the chest pain. She says her lips are numb as well.  - Mother had 3 MI's. She was around 50.  - Unknown if dad had heart disease or not.  - She says she is getting heartburn with burning up her throat and a bad taste in her mouth. She is brushing her teeth 4-5 times / day because of her symptoms.  - she gets sudden pain / sharp pain in her chest, though she also describes it as a pressure type of pain.  - Hard to say if activity makes the pain / shortness of breath worse or not. Resting will ease up the pain, but it does not completely go away.  - she has been seen by cardiology recently.  - Taking ASA and she takes Metoprolol Tartrate 25mg  BID, and she takes an extra metoprolol if she has symptoms. This "relaxes it so the pain is not as intense".   All relevant systems were reviewed and were negative unless otherwise noted in the HPI  Past Medical History Reviewed problem list.  Medications- reviewed and updated Current Outpatient Prescriptions  Medication Sig Dispense Refill  . allopurinol (ZYLOPRIM) 100 MG tablet Take 1 tablet (100 mg total) by mouth every morning. 60 tablet 5  . aspirin EC 81 MG tablet Take 81 mg by mouth daily.      . colchicine 0.6 MG tablet Take 1 tablet (0.6  mg total) by mouth daily as needed. 30 tablet 5  . cyclobenzaprine (FLEXERIL) 10 MG tablet Take 10 mg by mouth 2 (two) times daily.     Marland Kitchen gabapentin (NEURONTIN) 300 MG capsule Take 300 mg by mouth 3 (three) times daily.    . lansoprazole (PREVACID) 30 MG capsule TAKE 1 CAPSULE BY MOUTH EVERY DAY 90 capsule 3  . levothyroxine (SYNTHROID, LEVOTHROID) 75 MCG tablet Take 1 tablet (75 mcg total) by mouth daily. 90 tablet 4  . LORazepam (ATIVAN) 0.5 MG tablet Take 2 tablets (1 mg total) by mouth at bedtime as needed for anxiety or sleep. 90 tablet 0  . metoprolol succinate (TOPROL-XL) 25 MG 24 hr tablet Take 1 tablet (25 mg total) by mouth 2 (two) times daily. 60 tablet 11  . MOVANTIK 25 MG TABS Take 1 tablet by mouth daily as needed (to use the bathroom).   0  . oxyCODONE (OXYCONTIN) 20 mg 12 hr tablet Take 20 mg by mouth every 12 (twelve) hours.    . sertraline (ZOLOFT) 50 MG tablet Take 1 tablet (50 mg total) by mouth daily. 30 tablet 0  . traZODone (DESYREL) 50 MG tablet TAKE 1/2 TO 1  TABLET BY MOUTH EVERY NIGHT AT BEDTIME AS NEEDED FOR SLEEP 90 tablet 5   No current facility-administered medications for this visit.   Chief complaint-noted No additions to family history Social history- patient is a 1ppd smoker  Objective: BP 146/101 mmHg  Pulse 87  Temp(Src) 98.7 F (37.1 C) (Oral)  Wt 187 lb 11.2 oz (85.14 kg)  LMP 05/17/2009 Gen: NAD, alert, cooperative with exam HEENT: NCAT, EOMI, PERRL Neck: FROM, supple CV: RRR, good S1/S2, no murmur, 2+ distal pulses.  Resp: CTABL, no wheezes, non-labored Abd: SNTND, BS present, no guarding or organomegaly Ext: No edema, warm, normal tone, moves UE/LE spontaneously Neuro: Alert and oriented, No gross deficits Skin: no rashes no lesions  Assessment/Plan:  # Chest Pain - having chest pain with symptoms suspicious for angina. Some atypical / typical symptoms. She has significant risk factors. Her HEART score is 5, also significantly. Her mother  had MI x 3 starting at age 103. Concerning with her risk.  - EKG obtained in clinic. No acute changes.  - Sent straight to the ED.  - We were going to send her via [facility] given her symptoms, but she refused and said she would like to drive herself. I explained to her the risk of driving in the setting of acute MI were high and that she could potentially kill herself or someone else or decompensate without adequate care in place to treat her. She acknowledged these risks and said she wanted to drive herself.  - Appreciate ED workup of this patient.

## 2015-06-01 NOTE — Progress Notes (Signed)
[facility] Admission History and Physical Service Pager: [phone]  Patient name: [name] Medical record number: [id number] Date of birth: Oct 19, 1960 Age: 55 y.o. Gender: female  Primary Care Provider: Paula Compton, MD Consultants: Cards in am Code Status: FULL (discussed on admission)  Chief Complaint: chest pain  Assessment and Plan: [name] is a 55 y.o. female presenting with chest pain . PMH is significant for chronic chest pain, HTN, HLD, Tobacco abuse, Obesity, CKD 3, anxiety, hypothyroidism, back pain  # Chest pain/ Atypical angina:  Heart score 5.  EKG nonischemic, iTrop negative.   Diastolic BP slightly elevated to 100 in ED.  VS otherwise stable.  Apparently, has had a long standing history of CP.  She is seen by Dr Ellyn Hack outpatient.  They have discussed stress testing her on a couple of occassions but patient has declined in the past.  She is agreeable during this hospitalization.  She is on Toprol XL 25 BID (discussed with pharmacy, this is indeed how this is being prescribed).  - Place in observation under Dr Ardelia Mems - Telemetry - VS per floor protocol - trend troponin - TSH, lipid, A1c ordered - BMP, CBC in am - UDS ordered - EKG in am - ASA 325mg   - Zofran prn - Home pain meds ordered - Will change Metop Succinate to Metoprolol tartrate 25mg  BID while hospitalized and verify dosing with cards in am. - Consider c/s cards in am. ?outpatient stress test - O2 prn saturation >92%.  Wears 2L O2 [place] qhs.  This has been ordered. - Not on a statin, will calculate ASCVD risk once lipid panel back  # HTN: BP 146/101 in ED.  On Toprol XL 25mg  BID - Metoprolol tartrate while hospitalized  # HLD; not on statin - lipid panel - calc ASCVD risk  # Hypothyroidism: last TSH 01/2014 2.937.  On synthroid 75 mcg at home - continue home dose - check TSH  # CKD 3: Cr 1.36 in ED.  Baseline 1.22-1.25 - Avoid nephrotoxic agents - BMP in  am  # Tobacco use disorder: smokes 1/2-3/4 ppd of light cigs - Nicotine 74mcg patch - smoking cessation counseling  # Anxiety: Previously on Xanax and Zoloft.  Recently dc'd 2/2 adverse side effects.  Takes Trazodone 25-50mg  qhs for sleep - continue trazodone  # Back pain: sees pain clinic.  On Gabapentin, Oxy ER, Baclofen - Continue home meds while hospitalized   FEN/GI: HH diet, PPI Prophylaxis: Lovenox sub-q  Disposition: Obs for ACS r/p  History of Present Illness:  [name] is a 55 y.o. female presenting with chest pain  Patient notes a 6 month history of chest pain.  She reports that CP normally comes and goes.  She notes that CP this time started 3 days ago.  She reports nausea and vomiting with CP.  CP is constant and left sided.  Sharp in nature.  Was pressure like and substernal 2 days ago.  She reports that she sees a cardiologist (Dr Ellyn Hack) and that they have not found a reason for her CP but that they have adjusted doses of her BP medications.  She reports intermittent diaphoresis, blurry vision (with position changes).  Endorses SOB intermittently.  No abdominal pain.  Had 1 loose stool last evening.  No sick contacts.  No fevers, chills.    Uses 2L O2 at night time per Cardiology.  She lives at home with her husband and is completely independent in her ADLs.  Has taken all her am doses of meds.  Takes ASA 81 at night time.  Review Of Systems: Per HPI with the following additions: none Otherwise the remainder of the systems were negative.  Patient Active Problem List   Diagnosis Date Noted  . Encounter for smoking cessation counseling 11/29/2014  . Skin lesion 03/18/2014  . Headache 02/08/2014  . Tobacco abuse 02/08/2014  . Myalgia 07/22/2013  . Vertigo 05/12/2013  . Hip numbness 05/12/2013  . Gout 05/12/2013  . Ringing in ears 04/10/2013  . Obesity (BMI 30-39.9) 12/31/2012  . Palpitations 06/10/2012  . Chest pain, musculoskeletal 06/10/2012  . Hot flashes  06/22/2011  . Coagulopathy ([place]) 12/14/2010  . Abnormal ECG 09/17/2010  . Vitamin D deficiency 05/18/2010  . Nonallopathic lesion of cervicothoracic region 05/18/2010  . ASCUS PAP 06/07/2008  . Chronic kidney disease (CKD), stage III (moderate) 06/10/2007  . Hypothyroidism 03/20/2006  . HYPERCHOLESTEROLEMIA 03/20/2006  . DEPRESSION, MAJOR, RECURRENT 03/20/2006  . ANXIETY 03/20/2006  . Essential hypertension 03/20/2006  . PEPTIC ULCER DIS., UNSPEC. W/O OBSTRUCTION 03/20/2006  . 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 Ankylosing spondylitis ([place]) 03/20/2006  . OSTEOPOROSIS, UNSPECIFIED 03/20/2006  . SCOLIOSIS 03/20/2006  . INSOMNIA NOS 03/20/2006    Past Medical History: Past Medical History  Diagnosis Date  . Allergy   . Arthritis   . Depression   . GERD (gastroesophageal reflux disease)   . Hyperlipidemia   . Hypertension   . Osteoporosis   . Hypothyroidism   . Anxiety   . Chronic pain syndrome   . Tobacco abuse   . Obesity   . Gastroparesis   . Ankylosing spondylitis ([place])   . Depression   . CKD (chronic kidney disease) stage 3, GFR 30-59 ml/min   . Nephrolithiasis     Past Surgical History: Past Surgical History  Procedure Laterality Date  . Total knee arthroplasty      left  . Back surgery    . Neck surgery    . Appendectomy    . Cholecystectomy    . Right knee arthroplasty    . Doppler echocardiography  09/26/2010    EF=>55%, LV norm  . Nm myocar perf wall motion  09/26/2010    Lexiscan EF 83%  EF may be overestimated due to LVH    Social History: Social History  Substance Use Topics  . Smoking status: Current Every Day Smoker -- 1.00 packs/day    Types: Cigarettes    Last Attempt to Quit: 10/12/2010  . Smokeless tobacco: None  . Alcohol Use: No   Additional social history: smokes 1/2-3/4 ppd, no drugs, no ETOH  Please also refer to relevant sections of EMR.  Family History: Family History  Problem Relation Age of Onset  .  Diabetes Mother   . Heart failure Mother   . Hyperlipidemia Mother   . Hypertension Mother   . Hyperlipidemia Father   . Cancer Son     lymphoma stage 4 at 30   Allergies and Medications: Allergies  Allergen Reactions  . Codeine Nausea And Vomiting    "Will stop her heart"  . Latex Itching  . Acyclovir And Related Itching and Rash   No current facility-administered medications on file prior to encounter.   Current Outpatient Prescriptions on File Prior to Encounter  Medication Sig Dispense Refill  . allopurinol (ZYLOPRIM) 100 MG tablet Take 1 tablet (100 mg total) by mouth every morning. 60 tablet 5  . aspirin EC 81 MG tablet Take 81 mg  by mouth daily.      . colchicine 0.6 MG tablet Take 1 tablet (0.6 mg total) by mouth daily as needed. 30 tablet 5  . cyclobenzaprine (FLEXERIL) 10 MG tablet Take 10 mg by mouth 2 (two) times daily.     Marland Kitchen gabapentin (NEURONTIN) 300 MG capsule Take 300 mg by mouth 3 (three) times daily.    . lansoprazole (PREVACID) 30 MG capsule TAKE 1 CAPSULE BY MOUTH EVERY DAY 90 capsule 3  . levothyroxine (SYNTHROID, LEVOTHROID) 75 MCG tablet Take 1 tablet (75 mcg total) by mouth daily. 90 tablet 4  . LORazepam (ATIVAN) 0.5 MG tablet Take 2 tablets (1 mg total) by mouth at bedtime as needed for anxiety or sleep. 90 tablet 0  . metoprolol succinate (TOPROL-XL) 25 MG 24 hr tablet Take 1 tablet (25 mg total) by mouth 2 (two) times daily. 60 tablet 11  . MOVANTIK 25 MG TABS Take 1 tablet by mouth daily as needed (to use the bathroom).   0  . oxyCODONE (OXYCONTIN) 20 mg 12 hr tablet Take 20 mg by mouth every 12 (twelve) hours.    . sertraline (ZOLOFT) 50 MG tablet Take 1 tablet (50 mg total) by mouth daily. 30 tablet 0  . traZODone (DESYREL) 50 MG tablet TAKE 1/2 TO 1 TABLET BY MOUTH EVERY NIGHT AT BEDTIME AS NEEDED FOR SLEEP 90 tablet 5    Objective: BP 134/91 mmHg  Pulse 83  Temp(Src) 97.3 F (36.3 C) (Oral)  Resp 18  SpO2 95%  LMP 05/17/2009 Exam: General:  awake, alert, obese female, NAD Eyes: EOMI, wears glasses, PERRL ENTM: o/p clear, poor dentition, MMM Neck: supple, no JVD Cardiovascular: RRR, no murmurs Respiratory: CTAB, no wheeze, air movement fair Abdomen: obese, soft, NT, +BS MSK: no edema, moves all extremities independently Skin: multiple tattoos on back but no rashes or lesions Neuro: follows commands, no focal deficits Psych: mood stable, speech normal  Labs and Imaging: Results for orders placed or performed during the hospital encounter of 06/01/15 (from the past 24 hour(s))  Basic metabolic panel     Status: Abnormal   Collection Time: 06/01/15  5:27 PM  Result Value Ref Range   Sodium 139 135 - 145 mmol/L   Potassium 4.9 3.5 - 5.1 mmol/L   Chloride 98 (L) 101 - 111 mmol/L   CO2 31 22 - 32 mmol/L   Glucose, Bld 110 (H) 65 - 99 mg/dL   BUN 21 (H) 6 - 20 mg/dL   Creatinine, Ser 1.36 (H) 0.44 - 1.00 mg/dL   Calcium 9.2 8.9 - 10.3 mg/dL   GFR calc non Af Amer 43 (L) >60 mL/min   GFR calc Af Amer 50 (L) >60 mL/min   Anion gap 10 5 - 15  CBC     Status: Abnormal   Collection Time: 06/01/15  5:27 PM  Result Value Ref Range   WBC 11.5 (H) 4.0 - 10.5 K/uL   RBC 5.72 (H) 3.87 - 5.11 MIL/uL   Hemoglobin 17.4 (H) 12.0 - 15.0 g/dL   HCT 52.9 (H) 36.0 - 46.0 %   MCV 92.5 78.0 - 100.0 fL   MCH 30.4 26.0 - 34.0 pg   MCHC 32.9 30.0 - 36.0 g/dL   RDW 16.9 (H) 11.5 - 15.5 %   Platelets 261 150 - 400 K/uL  I-stat troponin, ED     Status: None   Collection Time: 06/01/15  5:34 PM  Result Value Ref Range   Troponin i, poc 0.00  0.00 - 0.08 ng/mL   Comment 3           Dg Chest 2 View  06/01/2015  CLINICAL DATA:  Chest pain for 4 days EXAM: CHEST  2 VIEW COMPARISON:  01/17/2015 FINDINGS: Rounded lucent opacities over both upper lobes suggest EKG leads. Heart size and vascular pattern normal. Lungs clear. No effusion or pneumothorax. Mild sigmoid scoliotic curvature thoracolumbar spine similar to prior study. IMPRESSION: No acute  findings Electronically Signed   By: Skipper Cliche M.D.   On: 06/01/2015 18:31    Janora Norlander, DO 06/01/2015, 7:17 PM PGY-2, [place] [facility] pager: [phone], text pages welcome

## 2015-06-01 NOTE — ED Provider Notes (Signed)
CSN: [id number]     Arrival date & time 06/01/15  1713 History   First MD Initiated Contact with Patient 06/01/15 1846     Chief Complaint  Patient presents with  . Chest Pain     (Consider location/radiation/quality/duration/timing/severity/associated sxs/prior Treatment) HPI AMAURA NEASE is a 55 y.o. female history of hypertension, family history of heart disease, smoker, hyperlipidemia, here for evaluation of chest pain. Patient sent by PCP for evaluation of chest pain. Patient for she has had ongoing chest pain for the past 6 months. She reports in the past 3 days she has had more nausea, vomiting and diarrhea with her chest pain. Most recent episode started at 4:15, while at her PCPs office, characterized as sharp in her left chest with radiation into left arm. She does report intermittently throughout the past 6 months that she has been dizzy and is followed by her PCP for that problem. Denies any other unusual numbness or weakness, diaphoresis, unilateral leg swelling. States that she is not very active, but does not think discomfort is exertional.  Past Medical History  Diagnosis Date  . Allergy   . Arthritis   . Depression   . GERD (gastroesophageal reflux disease)   . Hyperlipidemia   . Hypertension   . Osteoporosis   . Hypothyroidism   . Anxiety   . Chronic pain syndrome   . Tobacco abuse   . Obesity   . Gastroparesis   . Ankylosing spondylitis ([place])   . Depression   . CKD (chronic kidney disease) stage 3, GFR 30-59 ml/min   . Nephrolithiasis    Past Surgical History  Procedure Laterality Date  . Total knee arthroplasty      left  . Back surgery    . Neck surgery    . Appendectomy    . Cholecystectomy    . Right knee arthroplasty    . Doppler echocardiography  09/26/2010    EF=>55%, LV norm  . Nm myocar perf wall motion  09/26/2010    Lexiscan EF 83%  EF may be overestimated due to LVH   Family History  Problem Relation Age of Onset  . Diabetes Mother   .  Heart failure Mother   . Hyperlipidemia Mother   . Hypertension Mother   . Hyperlipidemia Father   . Cancer Son     lymphoma stage 4 at 25   Social History  Substance Use Topics  . Smoking status: Current Every Day Smoker -- 1.00 packs/day    Types: Cigarettes    Last Attempt to Quit: 10/12/2010  . Smokeless tobacco: None  . Alcohol Use: No   OB History    No data available     Review of Systems A 10 point review of systems was completed and was negative except for pertinent positives and negatives as mentioned in the history of present illness     Allergies  Codeine; Latex; and Acyclovir and related  Home Medications   Prior to Admission medications   Medication Sig Start Date End Date Taking? Authorizing Provider  allopurinol (ZYLOPRIM) 100 MG tablet Take 1 tablet (100 mg total) by mouth every morning. 11/22/14   Aquilla Hacker, MD  aspirin EC 81 MG tablet Take 81 mg by mouth daily.      Historical Provider, MD  colchicine 0.6 MG tablet Take 1 tablet (0.6 mg total) by mouth daily as needed. 02/08/15   York Ram Melancon, MD  cyclobenzaprine (FLEXERIL) 10 MG tablet Take 10 mg by  mouth 2 (two) times daily.     Historical Provider, MD  gabapentin (NEURONTIN) 300 MG capsule Take 300 mg by mouth 3 (three) times daily.    Historical Provider, MD  lansoprazole (PREVACID) 30 MG capsule TAKE 1 CAPSULE BY MOUTH EVERY DAY 04/25/15   Aquilla Hacker, MD  levothyroxine (SYNTHROID, LEVOTHROID) 75 MCG tablet Take 1 tablet (75 mcg total) by mouth daily. 11/22/14   Aquilla Hacker, MD  LORazepam (ATIVAN) 0.5 MG tablet Take 2 tablets (1 mg total) by mouth at bedtime as needed for anxiety or sleep. 03/30/15 03/28/16  Aquilla Hacker, MD  metoprolol succinate (TOPROL-XL) 25 MG 24 hr tablet Take 1 tablet (25 mg total) by mouth 2 (two) times daily. 12/21/14   Rhonda G Barrett, PA-C  MOVANTIK 25 MG TABS Take 1 tablet by mouth daily as needed (to use the bathroom).  02/10/14   Historical Provider, MD   oxyCODONE (OXYCONTIN) 20 mg 12 hr tablet Take 20 mg by mouth every 12 (twelve) hours.    Historical Provider, MD  sertraline (ZOLOFT) 50 MG tablet Take 1 tablet (50 mg total) by mouth daily. 04/17/15   York Ram Melancon, MD  traZODone (DESYREL) 50 MG tablet TAKE 1/2 TO 1 TABLET BY MOUTH EVERY NIGHT AT BEDTIME AS NEEDED FOR SLEEP 02/08/15   York Ram Melancon, MD   BP 134/91 mmHg  Pulse 83  Temp(Src) 97.3 F (36.3 C) (Oral)  Resp 18  SpO2 95%  LMP 05/17/2009 Physical Exam  Constitutional: She is oriented to person, place, and time. She appears well-developed and well-nourished.  HENT:  Head: Normocephalic and atraumatic.  Mouth/Throat: Oropharynx is clear and moist.  Eyes: Conjunctivae are normal. Pupils are equal, round, and reactive to light. Right eye exhibits no discharge. Left eye exhibits no discharge. No scleral icterus.  Neck: Neck supple.  Cardiovascular: Normal rate, regular rhythm and normal heart sounds.   Pulmonary/Chest: Effort normal and breath sounds normal. No respiratory distress. She has no wheezes. She has no rales.  Abdominal: Soft. There is no tenderness.  Musculoskeletal: She exhibits no edema or tenderness.  Neurological: She is alert and oriented to person, place, and time.  Cranial Nerves II-XII grossly intact  Skin: Skin is warm and dry. No rash noted.  Psychiatric: She has a normal mood and affect.  Nursing note and vitals reviewed.   ED Course  Procedures (including critical care time) Labs Review Labs Reviewed  BASIC METABOLIC PANEL - Abnormal; Notable for the following:    Chloride 98 (*)    Glucose, Bld 110 (*)    BUN 21 (*)    Creatinine, Ser 1.36 (*)    GFR calc non Af Amer 43 (*)    GFR calc Af Amer 50 (*)    All other components within normal limits  CBC - Abnormal; Notable for the following:    WBC 11.5 (*)    RBC 5.72 (*)    Hemoglobin 17.4 (*)    HCT 52.9 (*)    RDW 16.9 (*)    All other components within normal limits  I-STAT  TROPOININ, ED    Imaging Review Dg Chest 2 View  06/01/2015  CLINICAL DATA:  Chest pain for 4 days EXAM: CHEST  2 VIEW COMPARISON:  01/17/2015 FINDINGS: Rounded lucent opacities over both upper lobes suggest EKG leads. Heart size and vascular pattern normal. Lungs clear. No effusion or pneumothorax. Mild sigmoid scoliotic curvature thoracolumbar spine similar to prior study. IMPRESSION: No acute findings Electronically  Signed   By: Skipper Cliche M.D.   On: 06/01/2015 18:31   I have personally reviewed and evaluated these images and lab results as part of my medical decision-making.   EKG Interpretation   Date/Time:  Thursday Jun 01 2015 17:18:18 EDT Ventricular Rate:  79 PR Interval:  136 QRS Duration: 66 QT Interval:  350 QTC Calculation: 401 R Axis:   30 Text Interpretation:  Normal sinus rhythm Low voltage QRS Cannot rule out  Anterior infarct , age undetermined Abnormal ECG no significant change  since earlier in the day Confirmed by GOLDSTON MD, SCOTT [phone]) on  06/01/2015 6:56:29 PM      MDM  [name] is a 55 y.o. female here for Evaluation of chest pain. Heart score 5. Initial troponin negative, EKG without ischemic changes. Chest x-ray shows no acute cardiopulmonary pathology Discussed with my attending, Dr. Regenia Skeeter. Also discussed with family practice medical service, will admit patient for further workup. Final diagnoses:  Chest discomfort        Comer Locket, PA-C 06/01/15 [place], MD 06/02/15 [phone]

## 2015-06-02 ENCOUNTER — Observation Stay (HOSPITAL_COMMUNITY): Payer: No Typology Code available for payment source

## 2015-06-02 ENCOUNTER — Encounter (HOSPITAL_COMMUNITY): Payer: Self-pay | Admitting: Physician Assistant

## 2015-06-02 ENCOUNTER — Other Ambulatory Visit: Payer: Self-pay

## 2015-06-02 ENCOUNTER — Observation Stay (HOSPITAL_BASED_OUTPATIENT_CLINIC_OR_DEPARTMENT_OTHER): Payer: No Typology Code available for payment source

## 2015-06-02 DIAGNOSIS — R0789 Other chest pain: Secondary | ICD-10-CM | POA: Diagnosis not present

## 2015-06-02 DIAGNOSIS — Z720 Tobacco use: Secondary | ICD-10-CM

## 2015-06-02 DIAGNOSIS — I10 Essential (primary) hypertension: Secondary | ICD-10-CM | POA: Diagnosis not present

## 2015-06-02 DIAGNOSIS — R072 Precordial pain: Secondary | ICD-10-CM | POA: Insufficient documentation

## 2015-06-02 DIAGNOSIS — R079 Chest pain, unspecified: Secondary | ICD-10-CM | POA: Diagnosis not present

## 2015-06-02 DIAGNOSIS — E039 Hypothyroidism, unspecified: Secondary | ICD-10-CM

## 2015-06-02 DIAGNOSIS — I129 Hypertensive chronic kidney disease with stage 1 through stage 4 chronic kidney disease, or unspecified chronic kidney disease: Secondary | ICD-10-CM | POA: Diagnosis not present

## 2015-06-02 DIAGNOSIS — N183 Chronic kidney disease, stage 3 (moderate): Secondary | ICD-10-CM | POA: Diagnosis not present

## 2015-06-02 DIAGNOSIS — G894 Chronic pain syndrome: Secondary | ICD-10-CM | POA: Diagnosis not present

## 2015-06-02 LAB — FERRITIN: Ferritin: 79 ng/mL (ref 11–307)

## 2015-06-02 LAB — CBC
HCT: 49.5 % — ABNORMAL HIGH (ref 36.0–46.0)
Hemoglobin: 16.2 g/dL — ABNORMAL HIGH (ref 12.0–15.0)
MCH: 30.7 pg (ref 26.0–34.0)
MCHC: 32.7 g/dL (ref 30.0–36.0)
MCV: 93.8 fL (ref 78.0–100.0)
PLATELETS: 274 10*3/uL (ref 150–400)
RBC: 5.28 MIL/uL — ABNORMAL HIGH (ref 3.87–5.11)
RDW: 16.9 % — AB (ref 11.5–15.5)
WBC: 9.7 10*3/uL (ref 4.0–10.5)

## 2015-06-02 LAB — RETICULOCYTES
RBC.: 5.44 MIL/uL — AB (ref 3.87–5.11)
Retic Count, Absolute: 49 10*3/uL (ref 19.0–186.0)
Retic Ct Pct: 0.9 % (ref 0.4–3.1)

## 2015-06-02 LAB — LIPID PANEL
CHOL/HDL RATIO: 4.6 ratio
Cholesterol: 208 mg/dL — ABNORMAL HIGH (ref 0–200)
HDL: 45 mg/dL (ref >40)
LDL Cholesterol: 122 mg/dL — ABNORMAL HIGH (ref 0–99)
Triglycerides: 205 mg/dL — ABNORMAL HIGH (ref <150)
VLDL: 41 mg/dL — AB (ref 0–40)

## 2015-06-02 LAB — IRON AND TIBC
IRON: 227 ug/dL — AB (ref 28–170)
SATURATION RATIOS: 53 % — AB (ref 10.4–31.8)
TIBC: 428 ug/dL (ref 250–450)
UIBC: 201 ug/dL

## 2015-06-02 LAB — NM MYOCAR MULTI W/SPECT W/WALL MOTION / EF
CHL CUP RESTING HR STRESS: 65 {beats}/min
CSEPED: 5 min
CSEPEDS: 20 s
Estimated workload: 1 METS
MPHR: 166 {beats}/min
Peak HR: 97 {beats}/min
Percent HR: 58 %

## 2015-06-02 LAB — TRANSFERRIN: Transferrin: 306 mg/dL (ref 192–382)

## 2015-06-02 LAB — RAPID URINE DRUG SCREEN, HOSP PERFORMED
Amphetamines: NOT DETECTED
BARBITURATES: NOT DETECTED
BENZODIAZEPINES: POSITIVE — AB
Cocaine: NOT DETECTED
Opiates: NOT DETECTED
TETRAHYDROCANNABINOL: NOT DETECTED

## 2015-06-02 LAB — BASIC METABOLIC PANEL
Anion gap: 11 (ref 5–15)
BUN: 18 mg/dL (ref 6–20)
CO2: 28 mmol/L (ref 22–32)
Calcium: 9.2 mg/dL (ref 8.9–10.3)
Chloride: 100 mmol/L — ABNORMAL LOW (ref 101–111)
Creatinine, Ser: 1.1 mg/dL — ABNORMAL HIGH (ref 0.44–1.00)
GFR calc Af Amer: >60 mL/min (ref >60)
GFR, EST NON AFRICAN AMERICAN: 56 mL/min — AB (ref >60)
GLUCOSE: 107 mg/dL — AB (ref 65–99)
POTASSIUM: 4.6 mmol/L (ref 3.5–5.1)
Sodium: 139 mmol/L (ref 135–145)

## 2015-06-02 LAB — VITAMIN B12: VITAMIN B 12: 151 pg/mL — AB (ref 180–914)

## 2015-06-02 LAB — TROPONIN I: Troponin I: <0.03 ng/mL (ref <0.031)

## 2015-06-02 LAB — FOLATE: FOLATE: 11.1 ng/mL (ref >5.9)

## 2015-06-02 LAB — TSH: TSH: 0.805 u[IU]/mL (ref 0.350–4.500)

## 2015-06-02 MED ORDER — TECHNETIUM TC 99M SESTAMIBI GENERIC - CARDIOLITE
10.0000 | Freq: Once | INTRAVENOUS | Status: AC | PRN
  Administered 2015-06-02: 10 via INTRAVENOUS

## 2015-06-02 MED ORDER — ATORVASTATIN CALCIUM 20 MG PO TABS: 20.0000 mg | ORAL_TABLET | Freq: Every day | ORAL | Status: DC

## 2015-06-02 MED ORDER — REGADENOSON 0.4 MG/5ML IV SOLN
0.4000 mg | Freq: Once | INTRAVENOUS | Status: AC
  Administered 2015-06-02: 0.4 mg via INTRAVENOUS

## 2015-06-02 MED ORDER — REGADENOSON 0.4 MG/5ML IV SOLN
INTRAVENOUS | Status: AC
  Filled 2015-06-02: qty 5

## 2015-06-02 MED ORDER — TECHNETIUM TC 99M SESTAMIBI GENERIC - CARDIOLITE
30.0000 | Freq: Once | INTRAVENOUS | Status: AC | PRN
  Administered 2015-06-02: 30 via INTRAVENOUS

## 2015-06-02 NOTE — Discharge Instructions (Signed)
You were seen for cardiac rule out. Cardiology has ruled out any cardiac cause. If you have chest pain, please call family medicine center or return to the ED.

## 2015-06-02 NOTE — Progress Notes (Signed)
Patient discharge paperwork gone over with patient in detail. All questions answered to patients satisfaction. Patient discharged home.

## 2015-06-02 NOTE — Discharge Summary (Signed)
[facility] Discharge Summary  Patient name: [name] Medical record number: [id number] Date of birth: 1960-10-26 Age: 55 y.o. Gender: female Date of Admission: 06/01/2015  Date of Discharge: 06/02/2015 Admitting Physician: Leeanne Rio, MD  Primary Care Provider: Paula Compton, MD Consultants: Cardiology  Indication for Hospitalization: ACS Rule Out  Discharge Diagnoses/Problem List:  Anxiety Chest Pain - resolved AS RA HLD HTN Tobacco Abuse  Disposition: Home  Discharge Condition: Stable  Discharge Exam:  See daily progress note.   Brief Hospital Course:  Pt. Is a 55 y/o F with significant PMHx for Tobacco Abuse, HLD, HTN, and Family History of early CAD in her mother presenting to the clinic with Chest pain and atypical ACS symptoms. HEART score 5, so admitted for observation overnight. Her EKG was unchanged from previous, Troponin was trended and was negative. Given risk and symptoms, cardiology was consulted. They recommended lexiscan. Stress portion of lexiscan performed on 5/12 with plan for resting portion on 5/13, however after stress portion, cardiology felt comfortable with the results, and recommended discharge with close outpatient follow up. Return precautions reviewed prior to d/c.   Issues for Follow Up:  1. F/U chest pain 2. F/U Iron studies / need for Heme referral.   Significant Procedures: Lexiscan  Significant Labs and Imaging:   Recent Labs Lab 06/01/15 1727 06/02/15 0844  WBC 11.5* 9.7  HGB 17.4* 16.2*  HCT 52.9* 49.5*  PLT 261 274    Recent Labs Lab 06/01/15 1727 06/02/15 0844  NA 139 139  K 4.9 4.6  CL 98* 100*  CO2 31 28  GLUCOSE 110* 107*  BUN 21* 18  CREATININE 1.36* 1.10*  CALCIUM 9.2 9.2     Results/Tests Pending at Time of Discharge: none.   Discharge Medications:    Medication List    TAKE these medications        allopurinol 100 MG tablet  Commonly known as:  ZYLOPRIM   Take 1 tablet (100 mg total) by mouth every morning.     aspirin EC 81 MG tablet  Take 81 mg by mouth daily.     atorvastatin 20 MG tablet  Commonly known as:  LIPITOR  Take 1 tablet (20 mg total) by mouth daily.     baclofen 10 MG tablet  Commonly known as:  LIORESAL  Take 10 mg by mouth 2 (two) times daily as needed.     colchicine 0.6 MG tablet  Take 1 tablet (0.6 mg total) by mouth daily as needed.     gabapentin 300 MG capsule  Commonly known as:  NEURONTIN  Take 300 mg by mouth 3 (three) times daily.     lansoprazole 30 MG capsule  Commonly known as:  PREVACID  TAKE 1 CAPSULE BY MOUTH EVERY DAY     levothyroxine 75 MCG tablet  Commonly known as:  SYNTHROID, LEVOTHROID  Take 1 tablet (75 mcg total) by mouth daily.     LORazepam 0.5 MG tablet  Commonly known as:  ATIVAN  Take 2 tablets (1 mg total) by mouth at bedtime as needed for anxiety or sleep.     metoprolol succinate 25 MG 24 hr tablet  Commonly known as:  TOPROL-XL  Take 1 tablet (25 mg total) by mouth 2 (two) times daily.     MOVANTIK 25 MG Tabs tablet  Generic drug:  naloxegol oxalate  Take 1 tablet by mouth daily as needed (to use the bathroom).     OXYCONTIN 20  mg 12 hr tablet  Generic drug:  oxyCODONE  Take 20 mg by mouth every 12 (twelve) hours.     traZODone 50 MG tablet  Commonly known as:  DESYREL  TAKE 1/2 TO 1 TABLET BY MOUTH EVERY NIGHT AT BEDTIME AS NEEDED FOR SLEEP        Discharge Instructions: Please refer to Patient Instructions section of EMR for full details.  Patient was counseled important signs and symptoms that should prompt return to medical care, changes in medications, dietary instructions, activity restrictions, and follow up appointments.   Follow-Up Appointments: Follow-up Information    Follow up with Dimas Chyle, MD On 06/06/2015.   Specialty:  Family Medicine   Why:  Hospital follow-up @ 3:30 PM   Contact information:   [id number]  [id number] [phone]       Aquilla Hacker, MD 06/02/2015, 4:41 PM PGY-2, [place]

## 2015-06-02 NOTE — Progress Notes (Signed)
Family Medicine Teaching Service Daily Progress Note Intern Pager: [phone]  Patient name: [name] Medical record number: [id number] Date of birth: 07/03/1960 Age: 55 y.o. Gender: female  Primary Care Provider: Paula Compton, MD Consultants: Cards Code Status: FULL  Pt Overview and Major Events to Date:  CP/ACS rule out  Assessment and Plan: [name] is a 55 y.o. female admitted for chest pain/ACS rule out. PMH is significant for chronic chest pain, HTN, HLD, Tobacco abuse, Obesity, CKD 3, anxiety, hypothyroidism, back pain  # Chest pain/ Atypical angina: Heart score 5. EKG nonischemic, iTrop negativex2. CXR negative.Diastolic BP slightly elevated to 100 in ED.On Toprol XL 25 BID.  - iTrop negative at ED; trending troponin: 0.03-->0.03  - Further management pending echo and cards recs - Risk stratification: ASCVD risk 6.7% would not benefit from station  -Lipid: Chol 208; TGD 205; HDL 45; LDL 122; VLDL 41  -TSH 0.805  - A1c pending - UDS + for benzos only - ASA 325mg   - Zofran prn - Per communication w/ O/P cards Dr. Ellyn Hack re: inpatient stress echo - Wears 2L O2 [place] qhs.  - Statin as below - Stress test done today. Cards ok with going home. OP f/u with Dr. Ellyn Hack.   #Erythrocytosis: Hgb 17.4 -> 16.2 this morning. 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 Chronic hypoxic state 2/2 smoking - Abnl iron studies show: Iron 227 (nl <170) and Saturation ratio of 53% (nl <31.8%)  - Transferrin 306 WNL.  - Consider o/p f/u  # HTN: BP 1119/70 this am. On Toprol XL 25mg  BID - Metop Succinate changed to Metoprolol tartrate 25mg  BID while hospitalized (dose verified with pharmacy)  # HLD; not on statin. ASCVD risk 6.7% (Smoker, no DM). Would not benefit from statin for primary prevention.  - Lipid: Chol 208; TGD 205; HDL 45; LDL 122; VLDL 41 - Consider statin for secondary prevention pending echo results - Start lipitor 20.   # Hypothyroidism: Stable. TSH 0.805. On synthroid 75 mcg at  home - continue home dose - TSH wnl at 0.805  # CKD 3: Cr 1.36 in ED. Baseline 1.22-1.25 - Avoid nephrotoxic agents  # Tobacco use disorder: smokes 1/2-3/4 ppd of light cigs. Trying smoking cessation: down to 3/4 from 1 pack. - Nicotine 3mcg patch - smoking cessation counseling  # Anxiety: Previously on Xanax and Zoloft. Recently dc'd 2/2 adverse side effects.Takes Trazodone 25-50mg  qhs for sleep - continue trazodone  # Back pain: sees pain clinic. On Gabapentin, Oxy ER, Baclofen - Continue home meds while hospitalized   FEN/GI: HH diet, PPI Prophylaxis: Lovenox sub-q  Disposition: Obs for ACS r/p  Subjective:  Patient is feeling ok. Although endorses: throbbing  L arm pain whenever has CP,nausea (w/o vomiting since 2 days ago), diaphoresis, DOE. Patient has a lot of other +ROS symptoms but the above are the ones different from her baseline. ROS +: HA, SOB, orthostatic hypotension (ligth headed with blurry vision upon standing), heart flutter, insomnia, muscle pain.   Objective: Temp:  [97.3 F (36.3 C)-98.7 F (37.1 C)] 97.9 F (36.6 C) (05/12 0500) Pulse Rate:  [65-87] 70 (05/12 0500) Resp:  [18-20] 20 (05/12 0500) BP: (114-146)/(67-101) 119/70 mmHg (05/12 0500) SpO2:  [95 %-100 %] 95 % (05/12 0500) Weight:  [84.823 kg (187 lb)-85.367 kg (188 lb 3.2 oz)] 84.823 kg (187 lb) (05/12 0500)   Physical Exam: General: Lying in bed in NAD Cardiovascular: rrr, no galops or murmurs Respiratory: expiratory wheezes in anterior peristernal area Abdomen: Nontender Extremities: +2  DP bilaterally. No edema. Intact sensation in LE bilaterally.   Laboratory:  Recent Labs Lab 06/01/15 1727  WBC 11.5*  HGB 17.4*  HCT 52.9*  PLT 261    Recent Labs Lab 06/01/15 1727  NA 139  K 4.9  CL 98*  CO2 31  BUN 21*  CREATININE 1.36*  CALCIUM 9.2  GLUCOSE 110*   CBC Latest Ref Rng 06/02/2015 06/01/2015 01/17/2015  WBC 4.0 - 10.5 K/uL 9.7 11.5(H) 8.1  Hemoglobin 12.0 - 15.0  g/dL 16.2(H) 17.4(H) 17.2(H)  Hematocrit 36.0 - 46.0 % 49.5(H) 52.9(H) 51.2(H)  Platelets 150 - 400 K/uL 274 261 271   Iron/TIBC/Ferritin/ %Sat    Component Value Date/Time   IRON 227* 06/02/2015 1021   TIBC 428 06/02/2015 1021   FERRITIN 79 06/02/2015 1021   IRONPCTSAT 53* 06/02/2015 1021     Imaging/Diagnostic Tests: None  Hershal Coria, Med Student 06/02/2015, 7:20 AM MS4, [place] Intern pager: [phone], text pages welcome  I agree with the above evaluation, assessment, and plan. Any correctional changes can be noted in [facility].   Aquilla Hacker, MD Family Medicine Resident - PGY 2  S: No acute events overnight. CP resolved  O:  Filed Vitals:   06/02/15 1403 06/02/15 1404  BP: 97/72   Pulse: 113 97  Temp:    Resp:    exam as above per med student note.   A/P: 55 y/o F here ACS ruled out. Myoview performed today pending read.  - Cards felt safe for d/c home with close f/u - Started lipitor.  - Need f/u of iron levels. ? Iron overload but normal Transferrin.

## 2015-06-02 NOTE — Progress Notes (Signed)
Lexiscan MV performed, 1 day study, [facility] to read.   [name] 06/02/2015 1:58 PM Beeper [phone]
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
## 2015-06-02 NOTE — Progress Notes (Signed)
Nutrition Consult  Received consult for low sodium diet education. Patient not in room, RN reports she is getting a stress test. Left education materials at bedside with RD contact information for questions. Please re-consult if further nutrition needs identified.  Molli Barrows, RD, LDN, [place] Pager [phone] After Hours Pager [phone]

## 2015-06-03 LAB — HEMOGLOBIN A1C
Hgb A1c MFr Bld: 6.5 % — ABNORMAL HIGH (ref 4.8–5.6)
MEAN PLASMA GLUCOSE: 140 mg/dL

## 2015-06-06 ENCOUNTER — Encounter: Payer: Self-pay | Admitting: Family Medicine

## 2015-06-06 ENCOUNTER — Ambulatory Visit (INDEPENDENT_AMBULATORY_CARE_PROVIDER_SITE_OTHER): Payer: No Typology Code available for payment source | Admitting: Family Medicine

## 2015-06-06 VITALS — BP 125/95 | Temp 98.0°F | Resp 85 | Ht 63.0 in | Wt 188.0 lb

## 2015-06-06 DIAGNOSIS — M67432 Ganglion, left wrist: Secondary | ICD-10-CM

## 2015-06-06 DIAGNOSIS — R0789 Other chest pain: Secondary | ICD-10-CM

## 2015-06-06 NOTE — Progress Notes (Signed)
    Subjective:  [name] is a 55 y.o. female who presents to the [facility] today with a chief complaint of hospital follow up.   HPI:  Hospital Follow up - Chest Pain Patient admitted last week for chest pain and ACS rule out. Had negative work up including stress test. Today does not have any significant chest pain or shortness of breath. Says that she had a similar chest pain ongoing for the past 6 months. Has not yet seen cardiology yet.   Wrist Cyst Patient noticed a cyst on the volar aspect of her left wrist 2 days ago. No pain, though says it is irritating. No drainage.   ROS: Per HPI  Objective:  Physical Exam: BP 125/95 mmHg  Temp(Src) 98 F (36.7 C) (Oral)  Resp 85  Ht 5\' 3"  (1.6 m)  Wt 188 lb (85.276 kg)  BMI 33.31 kg/m2  SpO2 94%  LMP 05/17/2009  Gen: NAD, resting comfortably CV: RRR with no murmurs appreciated Pulm: NWOB, CTAB with no crackles, wheezes, or rhonchi MSK: no edema, cyanosis, or clubbing noted. 9mm mobile nodule at volar base of thumb. Skin: warm, dry, Neuro: grossly normal, moves all extremities Psych: Normal affect and thought content  Assessment/Plan:  Chest discomfort Cardiac work up thus far negative. Symptoms essentially resolved. Continue statin, ASA, and beta blocker. Will follow up with cardiology.   Ganglion Cyst Skin nodule consistent with ganglion cyst. Offered aspiration today however patient declined. Follow up as needed.  Algis Greenhouse. Jerline Pain, [place] Resident PGY-2 06/06/2015 4:06 PM

## 2015-06-06 NOTE — Patient Instructions (Signed)
Please schedule an appointment with your heart doctor soon.  The cyst on your hand is called a ganglionic cyst. If it is bothering you, we can attempt to drain it with a needle. This works about 50% of the time. If it doesn't work you will need to see a Psychologist, sport and exercise to have it removed.  Take care,  Dr Jerline Pain

## 2015-06-06 NOTE — Assessment & Plan Note (Signed)
Cardiac work up thus far negative. Symptoms essentially resolved. Continue statin, ASA, and beta blocker. Will follow up with cardiology.

## 2015-06-20 ENCOUNTER — Encounter: Payer: Self-pay | Admitting: Physician Assistant

## 2015-06-20 ENCOUNTER — Ambulatory Visit (INDEPENDENT_AMBULATORY_CARE_PROVIDER_SITE_OTHER): Payer: No Typology Code available for payment source | Admitting: Physician Assistant

## 2015-06-20 VITALS — BP 110/72 | HR 82 | Ht 63.0 in | Wt 196.0 lb

## 2015-06-20 DIAGNOSIS — E669 Obesity, unspecified: Secondary | ICD-10-CM | POA: Diagnosis not present

## 2015-06-20 DIAGNOSIS — R0789 Other chest pain: Secondary | ICD-10-CM

## 2015-06-20 DIAGNOSIS — I10 Essential (primary) hypertension: Secondary | ICD-10-CM | POA: Diagnosis not present

## 2015-06-20 DIAGNOSIS — R079 Chest pain, unspecified: Secondary | ICD-10-CM

## 2015-06-20 DIAGNOSIS — E7800 Pure hypercholesterolemia, unspecified: Secondary | ICD-10-CM

## 2015-06-20 NOTE — Patient Instructions (Signed)
Continue same medications   Your physician recommends that you schedule a follow-up appointment in: 6 months with Dr.Harding
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
## 2015-07-05 ENCOUNTER — Other Ambulatory Visit: Payer: Self-pay | Admitting: *Deleted

## 2015-07-05 NOTE — Telephone Encounter (Signed)
Patient is calling to see if she can be placed back on her anxiety medication.  States that she has had a rough couple of nights.  Patient said that she was headed to the emergency room last night for her anxiety attack but decided to have her husband drive her around in the car and that seemed to calm her some.  Will forward to PCP to advise. Aliegha Paullin,CMA

## 2015-07-05 NOTE — Telephone Encounter (Signed)
Patient has not been seen since 04/2015 for anxiety.  She needs f/u appt before ativan will be considered. This is not a promise that it will be prescribed at f/u visit, as Dr. Minda Ditto got her off of this medication.  Virginia Crews, MD, MPH PGY-2,  [facility] 07/05/2015 1:48 PM

## 2015-07-05 NOTE — Telephone Encounter (Signed)
Patient scheduled an appt for 07-18-15.  She would like to know if she can get enough medication to last until her appt.  She is afraid that she would end up in the hospital without it.  I informed patient that all I could do was ask but I couldn't guarantee it. Jazmin Hartsell,CMA

## 2015-07-06 NOTE — Telephone Encounter (Signed)
Patient needs an appointment. If she is having significant issues with anxiety she should make an earlier appointment.  [name]. Jerline Pain, [place] Resident PGY-2 07/06/2015 8:20 AM

## 2015-07-07 NOTE — Telephone Encounter (Signed)
Patient is aware of this. Adaeze Better,CMA  

## 2015-07-12 ENCOUNTER — Telehealth: Payer: Self-pay | Admitting: Cardiology

## 2015-07-12 NOTE — Telephone Encounter (Signed)
Patient called and stated that she was having sharp chest pain in the center of her chest that radiates to her left arm and back. Pain occurs at rest. She has been mostly sleeping on and off all day, but when she wakes up the pain occurs.  Pain has been going on since 3 am and she has some associated SOB. She does not have any SL Nitro at home. It is reassuring that she has had a recent negative stress test, but I advised patient to go to ED since she has had continued pain all day. Review of clinic notes shows that she suffers from chronic chest pain, however she tells me that this pain is worse than her usual angina.

## 2015-07-18 ENCOUNTER — Encounter: Payer: Self-pay | Admitting: Family Medicine

## 2015-07-18 ENCOUNTER — Ambulatory Visit (INDEPENDENT_AMBULATORY_CARE_PROVIDER_SITE_OTHER): Payer: No Typology Code available for payment source | Admitting: Family Medicine

## 2015-07-18 VITALS — BP 101/68 | HR 73 | Temp 98.3°F | Ht 63.0 in | Wt 195.6 lb

## 2015-07-18 DIAGNOSIS — F411 Generalized anxiety disorder: Secondary | ICD-10-CM | POA: Diagnosis not present

## 2015-07-18 DIAGNOSIS — Z720 Tobacco use: Secondary | ICD-10-CM | POA: Diagnosis not present

## 2015-07-18 DIAGNOSIS — J309 Allergic rhinitis, unspecified: Secondary | ICD-10-CM

## 2015-07-18 MED ORDER — CETIRIZINE HCL 10 MG PO TABS: 10.0000 mg | ORAL_TABLET | Freq: Every day | ORAL | Status: DC

## 2015-07-18 NOTE — Patient Instructions (Signed)
Allergic Rhinitis Allergic rhinitis is when the mucous membranes in the nose respond to allergens. Allergens are particles in the air that cause your body to have an allergic reaction. This causes you to release allergic antibodies. Through a chain of events, these eventually cause you to release histamine into the blood stream. Although meant to protect the body, it is this release of histamine that causes your discomfort, such as frequent sneezing, congestion, and an itchy, runny nose.  CAUSES Seasonal allergic rhinitis (hay fever) is caused by pollen allergens that may come from grasses, trees, and weeds. Year-round allergic rhinitis (perennial allergic rhinitis) is caused by allergens such as house dust mites, pet dander, and mold spores. SYMPTOMS  Nasal stuffiness (congestion).  Itchy, runny nose with sneezing and tearing of the eyes. DIAGNOSIS Your health care provider can help you determine the allergen or allergens that trigger your symptoms. If you and your health care provider are unable to determine the allergen, skin or blood testing may be used. Your health care provider will diagnose your condition after taking your health history and performing a physical exam. Your health care provider may assess you for other related conditions, such as asthma, pink eye, or an ear infection. TREATMENT Allergic rhinitis does not have a cure, but it can be controlled by:  Medicines that block allergy symptoms. These may include allergy shots, nasal sprays, and oral antihistamines.  Avoiding the allergen. Hay fever may often be treated with antihistamines in pill or nasal spray forms. Antihistamines block the effects of histamine. There are over-the-counter medicines that may help with nasal congestion and swelling around the eyes. Check with your health care provider before taking or giving this medicine. If avoiding the allergen or the medicine prescribed do not work, there are many new medicines  your health care provider can prescribe. Stronger medicine may be used if initial measures are ineffective. Desensitizing injections can be used if medicine and avoidance does not work. Desensitization is when a patient is given ongoing shots until the body becomes less sensitive to the allergen. Make sure you follow up with your health care provider if problems continue. HOME CARE INSTRUCTIONS It is not possible to completely avoid allergens, but you can reduce your symptoms by taking steps to limit your exposure to them. It helps to know exactly what you are allergic to so that you can avoid your specific triggers. SEEK MEDICAL CARE IF:  You have a fever.  You develop a cough that does not stop easily (persistent).  You have shortness of breath.  You start wheezing.  Symptoms interfere with normal daily activities.   This information is not intended to replace advice given to you by your health care provider. Make sure you discuss any questions you have with your health care provider.   Document Released: 10/02/2000 Document Revised: 01/28/2014 Document Reviewed: 09/14/2012 Elsevier Interactive Patient Education 2016 Elsevier Inc.  

## 2015-07-18 NOTE — Assessment & Plan Note (Signed)
Start Zyrtec daily Would avoid using NyQuil every night Follow-up as needed

## 2015-07-18 NOTE — Assessment & Plan Note (Signed)
Advised patient that continuing to smoke could make allergic rhinitis worse

## 2015-07-18 NOTE — Progress Notes (Signed)
   Subjective:   [name] is a 55 y.o. female with a history of HTN, hypothyroidism, HLD, obesity, anxiety with panic attacks, chronic pain here for anxiety and allergic rhinitis   Anxiety - reports she almost died 5 yrs ago with liver and kidney failure - also thinks she had brain failure 5 yrs ago - reports that she used to take xanax and oxycontin together - reports panic attacks almost nightly (husband reports 2-3 times weekly) and occasionally during the day - cant stand it when dogs get close to her or when sinuses feel stuffed up - used to take zoloft and celexa - having weird dreams - still taking trazodone 50mg  qhs - has been taking for many years - nightly O2 helped anxiety - now sleeping 4 hrs at a time at night - has appt with psych in 2 days  Allergic rhinitis - taking nyquil - nasal stuffiness, sneezing - clear nasal drainage - seems worse in spring and summer - present for many years  Review of Systems:  Per HPI.   Social History: Current smoker  Objective:  BP 101/68 mmHg  Pulse 73  Temp(Src) 98.3 F (36.8 C) (Oral)  Ht 5\' 3"  (1.6 m)  Wt 195 lb 9.6 oz (88.724 kg)  BMI 34.66 kg/m2  LMP 05/17/2009  Gen:  55 y.o. female in NAD, Somewhat pressured speech and redundant questions HEENT: NCAT, MMM, EOMI, PERRL, anicteric sclerae, OP clear CV: RRR, no MRG Resp: Non-labored, CTAB, no wheezes noted Ext: WWP, no edema MSK: No obvious deformities currently Neuro: Alert and oriented, speech somewhat pressured    Assessment & Plan:     [name] is a 55 y.o. female here for   Anxiety state Patient has appointment scheduled with psychiatry in 2 days Advised her to continue take her current medications Advised her that psychiatry will be able to evaluate her more thoroughly and develop a plan to manage her anxiety and her panic attacks Advised her the benzodiazepines should be avoided while she is taking chronic narcotics  Tobacco abuse Advised  patient that continuing to smoke could make allergic rhinitis worse  Allergic rhinitis Start Zyrtec daily Would avoid using NyQuil every night Follow-up as needed     Virginia Crews, MD MPH PGY-2,  [facility] 07/18/2015  5:03 PM

## 2015-07-18 NOTE — Assessment & Plan Note (Signed)
Patient has appointment scheduled with psychiatry in 2 days Advised her to continue take her current medications Advised her that psychiatry will be able to evaluate her more thoroughly and develop a plan to manage her anxiety and her panic attacks Advised her the benzodiazepines should be avoided while she is taking chronic narcotics

## 2015-07-20 ENCOUNTER — Ambulatory Visit (HOSPITAL_COMMUNITY): Payer: Medicare Other | Admitting: Psychiatry

## 2015-08-25 ENCOUNTER — Other Ambulatory Visit: Payer: Self-pay | Admitting: Family Medicine

## 2015-08-25 NOTE — Telephone Encounter (Signed)
Pt stated she is really struggling with anxiety, "very important I can't go no more like this". Pt has an appointment with a psychiatrist, but it keeps getting rescheduled and its not until October. Pt would like for you to put her back on her anxiety medication until she can get in with the psychotics. Please advise. Thanks! ep

## 2015-08-25 NOTE — Telephone Encounter (Signed)
Return call to patient regarding starting on anxiety medication.  Patient has been off medication for about 4 months.  Her appointment with psych doctor has been canceled several times and next appt is October.  Patient stated she is max out in money and can not go to hospital.  Advised patient that she should go to [facility] for treatment.  Her anxiety medication is a controlled medication and her PCP is usually the provider that prescribes that.  Advised patient she would probably need an office visit since it has been 4 months.  Patient stated she can't wait for an appointment and need medication now.  Advised patient because it Friday and her PCP is out of office the next option would be her to go to [facility] for evaluation.  Patient insist that someone get in contact with her PCP.  Advised patient she could call the on-call provider for Family Medicine or go to [facility] again.  Patient denies wanting to harm herself or others.  Patient is wanting someone to contact her PCP.  Derl Barrow, RN

## 2015-08-25 NOTE — Telephone Encounter (Signed)
Spoke with MD.  She agrees with [place] plan.  Pt was informed to go to [place] long several times, attempted to call again, but no answer and no machine. Fleeger, Salome Spotted, CMA

## 2015-08-29 ENCOUNTER — Telehealth: Payer: Self-pay | Admitting: Cardiology

## 2015-08-29 NOTE — Telephone Encounter (Signed)
Returned patient call- patient reports she is concerned about her BP fluctuating and causing her anxiety.  BP readings as follows: 121/112, reports 1 hour late 114/85, 1 hour later 144/95, later that day 121/83. Advised patient that BP readings are not bad and are expected to increase with activity and/or anxiety.   Pt reports she can tell when her BP fluctuates because her anxiety increases and states her husband has to go ride her around in the car in order to calm down her down and decrease her BP.  Pt also reports CP, HA, and dizziness x 2-3 weeks.  CP increased with things that are "too much" for her such as "the dogs" and decreased when riding around in the car or "getting away".  Pt reports she believes that her BP is causing her anxiety and to have panic attacks.    Pt requesting to notify MD Ellyn Hack and would like to be seen soon.  Advised that I would route to MD Ellyn Hack and f/u regarding recommendations/advice.

## 2015-08-29 NOTE — Telephone Encounter (Signed)
° °  Pt c/o BP issue: STAT if pt c/o blurred vision, one-sided weakness or slurred speech  1. What are your last 5 BP readings? 121/112   HR 69  2. Are you having any other symptoms (ex. Dizziness, headache, blurred vision, passed out)? dizzy 3. What is your BP issue? High bp

## 2015-08-30 NOTE — Telephone Encounter (Signed)
The anxiety is causing the BP to go up & her worrying about it is making it worse.  These BP readings are not out of range - tell her not to worry about occasional ups & downs - that is natural.  [name]

## 2015-08-30 NOTE — Telephone Encounter (Signed)
LMTCB

## 2015-09-04 ENCOUNTER — Telehealth: Payer: Self-pay | Admitting: Internal Medicine

## 2015-09-04 NOTE — Telephone Encounter (Signed)
I am covering Dr. Nancy Nordmann box while she is on maternity leave. I received a message from Dr. Allison Quarry office that Pt wanted [place] to know that her anxiety is worsening. I called Pt who states her anxiety has been out of control recently. She has been on Xanax since 1992 and was recently switched to Ativan. Her last PCP (Dr. Minda Ditto) stopped her Ativan because she is also taking Oxycodone. Pt was referred to Psychiatry, but missed her appointment and her next appointment is not scheduled until October. Advised Pt that I cannot prescribe this type of medication over the phone and she will need to be seen in our clinic for evaluation of her anxiety.  Pt scheduled to see Dr. Ola Spurr on 8/23 for anxiety.  Hyman Bible, MD PGY-2

## 2015-09-04 NOTE — Telephone Encounter (Signed)
Message left for patient to call.

## 2015-09-04 NOTE — Telephone Encounter (Addendum)
Spoke with pt, aware of dr harding's recommendations. She was told not to check her blood pressure when she is feeling anxious. She was instructed to check her bp only when she is calm and relaxed. She is asking something for anxiety. Referred pt to her medical doctor for anxiety issues. She reports that she has an appt but not until October to see someone and she feels she will not make it to that appt. She would like me to forward this to her medical doctor to make them aware she is having issues.

## 2015-09-13 ENCOUNTER — Ambulatory Visit (INDEPENDENT_AMBULATORY_CARE_PROVIDER_SITE_OTHER): Payer: No Typology Code available for payment source | Admitting: Internal Medicine

## 2015-09-13 VITALS — BP 87/60 | HR 75 | Temp 98.1°F | Ht 63.0 in | Wt 190.4 lb

## 2015-09-13 DIAGNOSIS — F411 Generalized anxiety disorder: Secondary | ICD-10-CM | POA: Diagnosis not present

## 2015-09-13 MED ORDER — SERTRALINE HCL 50 MG PO TABS: 50.0000 mg | ORAL_TABLET | Freq: Every day | ORAL | 1 refills | Status: DC
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
## 2015-09-13 NOTE — Patient Instructions (Signed)
Ms. Macintosh,  Kermit Balo to see you today. Please restart zoloft at 50 mg daily. Follow-up in 2-4 weeks as able.  I will place a behavioral health referral to hopefully get you into counseling with [place] sooner.  Other reminders: Take colchicine only with gout flares. Allopurinol daily. Take levothyroxine at least 1 hour before eating to get maximum effect of medication.  Best, Dr. Ola Spurr

## 2015-09-15 ENCOUNTER — Encounter: Payer: Self-pay | Admitting: Internal Medicine

## 2015-09-15 ENCOUNTER — Telehealth: Payer: Self-pay | Admitting: Psychology

## 2015-09-15 NOTE — Telephone Encounter (Signed)
[facility] called patient to schedule appointment per Dr. Blane Ohara request. [facility] left voicemail requesting return call to schedule appointment.

## 2015-09-15 NOTE — Assessment & Plan Note (Signed)
-   Patient in severe range for anxiety according to GAD-7. Agreeable to restarting zoloft, as she thought this medication had been helping. Would like to receive counseling and is frustrated by wait time for psychiatrist. Interested in being seen by [place] at [facility].  - Start zoloft at 50 mg daily. May need increase to 100 mg. - Will send message to integrated care about scheduling appointment for patient (visit ended after 5 pm so could not involve Counselling psychologist)

## 2015-09-18 ENCOUNTER — Telehealth: Payer: Self-pay | Admitting: Psychology

## 2015-09-18 NOTE — Telephone Encounter (Signed)
[name] [facility]) scheduled initial behavioral health appointment. Patient informed me she was recently prescribed Zoloft but stopped taking it due to headaches and insomnia as well as increased anxiety. We will meet Sept. 8th

## 2015-09-19 ENCOUNTER — Other Ambulatory Visit: Payer: Self-pay | Admitting: *Deleted

## 2015-09-19 ENCOUNTER — Ambulatory Visit (HOSPITAL_COMMUNITY): Payer: No Typology Code available for payment source | Admitting: Psychiatry

## 2015-09-19 MED ORDER — COLCHICINE 0.6 MG PO TABS: 0.6000 mg | ORAL_TABLET | Freq: Every day | ORAL | 5 refills | Status: DC | PRN

## 2015-09-21 ENCOUNTER — Other Ambulatory Visit: Payer: Self-pay | Admitting: *Deleted

## 2015-09-21 MED ORDER — ATORVASTATIN CALCIUM 20 MG PO TABS: 20.0000 mg | ORAL_TABLET | Freq: Every day | ORAL | 3 refills | Status: DC

## 2015-09-28 ENCOUNTER — Telehealth (HOSPITAL_COMMUNITY): Payer: Self-pay | Admitting: Internal Medicine

## 2015-09-28 NOTE — Telephone Encounter (Signed)
55 y/o woman with obesity, HTN, severe anxiety and recurrent CP with negative Myoview in 5/17 called at 4:15a to report several days of chest and back pain which is improved this am. Reports she took her BP recently and was 140/101. Has alreaday taken her am BP medicines.   I told her this was likely not an emergent situation and she could take extra dose of her BP medicines but if symptoms worsened she could go to ER for evaluation or f/u with her PCP in am.   Riaz Onorato,MD 4:22 AM

## 2015-09-29 ENCOUNTER — Ambulatory Visit (INDEPENDENT_AMBULATORY_CARE_PROVIDER_SITE_OTHER): Payer: Medicare Other | Admitting: Psychology

## 2015-09-29 DIAGNOSIS — F411 Generalized anxiety disorder: Secondary | ICD-10-CM

## 2015-09-29 NOTE — Progress Notes (Signed)
Reason for follow-up:  To discuss patient's difficulty with anxiety and panic symptoms  Issues discussed:  We discussed patient's current symptoms of anxiety and panic regarding high blood pressure. We also discussed patient's difficulty with and discontinuation of Zoloft 2 weeks ago, and goals for treatment.  Identified goals:  Patient would like to schedule an appointment with a psychiatrist to receive medication for anxiety as soon as possible. She will plan to practice deep breathing before she becomes anxious about blood pressure, everyday in the morning for ten minutes. [facility] will attempt to help patient schedule an appointment with a psychiatrist and follow-up by phone in a week. [facility] will also check in with Dr. Ola Spurr about patient's blood pressure difficulty.  Addendum from Dr. Gwenlyn Saran:  Patient looks like she has an appointment with [facility] outpatient psychiatry.  This is the third appointment (one she did not keep and one was canceled).

## 2015-09-29 NOTE — Assessment & Plan Note (Signed)
Assessment/Plan/Recommendations: Patient was polite and friendly but regularily expressed frustration with the medical system and her changing medications. Though she has been frustrated in the past, she enjoys working with Dr. Ola Spurr. Patient experiences numerous medical conditions but primarily is concerned about high blood pressure. She reports that her blood pressure medication keeps her blood pressure relatively under control, but notices that her blood pressure sometimes spikes (systolic:144) during a day. In response to this, she practices deep breathing and spending time away from the house and says that this calms her down but doesn't resolve her high blood pressure quickly. She and her cardiologist are concerned about heart attacks. Patient takes multiple blood pressure readings everyday against the advice of her cardiologist who believes this is driven by anxiety. Patient also experiences nearly daily anxiety attacks centered around her blood pressure fluctuations. Patient did not respond well to Zoloft prescribed previously and taken for a week. She stopped approximately two weeks ago due to difficulty sleeping and heightened anxiety. She responded well in the past to Valium which decreased her anxiety and improved her sleep, but reports she was taken off of this. She uses the remainder of her valium sparingly when she feels panic (but has now run out). The patient is primarily interested in receiving anti-anxiety medication and feels confident in her use of behavioral coping strategies. [facility] will help patient set up a psychiatrist appointment, check in with Dr. Ola Spurr about patient's medications, and follow up with patient in a week.

## 2015-10-02 NOTE — Telephone Encounter (Signed)
Entered in error

## 2015-10-06 ENCOUNTER — Ambulatory Visit: Payer: No Typology Code available for payment source | Admitting: Family Medicine

## 2015-10-12 ENCOUNTER — Encounter: Payer: Self-pay | Admitting: Internal Medicine

## 2015-10-12 ENCOUNTER — Ambulatory Visit (INDEPENDENT_AMBULATORY_CARE_PROVIDER_SITE_OTHER): Payer: No Typology Code available for payment source | Admitting: Internal Medicine

## 2015-10-12 VITALS — BP 116/72 | HR 74 | Temp 98.6°F | Ht 63.0 in | Wt 188.0 lb

## 2015-10-12 DIAGNOSIS — Z0000 Encounter for general adult medical examination without abnormal findings: Secondary | ICD-10-CM

## 2015-10-12 DIAGNOSIS — F411 Generalized anxiety disorder: Secondary | ICD-10-CM

## 2015-10-12 DIAGNOSIS — Z23 Encounter for immunization: Secondary | ICD-10-CM | POA: Diagnosis not present

## 2015-10-12 DIAGNOSIS — F419 Anxiety disorder, unspecified: Secondary | ICD-10-CM | POA: Diagnosis not present

## 2015-10-12 MED ORDER — LORAZEPAM 1 MG PO TABS: ORAL_TABLET | ORAL | 1 refills | Status: DC

## 2015-10-12 NOTE — Progress Notes (Signed)
[facility] Progress Note  Subjective:  [name] is a 55-y/o female who presents to follow-up of anxiety.   Anxiety with panic attacks: - Continues to have panic attacks about once a week, with racing heart and trouble catching breath - Could not tolerate zoloft because of headaches and bad dreams - Reports some improved anxiety because her husband had a surgery to remove parotid glands and is no longer complaining of neck pain. Also has not been checking her blood pressure as often, only when she feels she is having symptoms -- face red or headache -- and has had anxiety related to abnormal values in the past - Says she cannot take medications that put her at risk of serotonin syndrome because she thinks she had this when she was on celexa and went into a coma 5 years ago.  - Has appointment with psychiatry in early October, following with [facility] behavioral health for counseling - Says she knows how to do deep breathing exercises but says does not help much. Going outside for fresh air helps most but is too hot for her right now to do so.  - Still having difficulty sleeping more than a few hours a night and that trazodone does not help.  - Feels like her chronic pain contributes to anxiety because she likes to be active and feels very restricted by her pain.  - Had previously been on xanax since 1992 and was switched to low dose of ativan due to concern for oversedation. Had been taken off of ativan this spring by last PCP, as she is also on oxycodone prescribed by pain clinic.  ROS: No chest pain, no diarrhea  Health Maintenance: - Due for flu shot, HIV screening, colonoscopy, mammogram  Social: Current smoker. Does not drink.   Objective: Blood pressure 116/72, pulse 74, temperature 98.6 F (37 C), temperature source Oral, height 5\' 3"  (1.6 m), weight 188 lb (85.3 kg), last menstrual period 05/17/2009. Body mass index is 33.3 kg/m. Constitutional: Obese female, in  NAD Cardiovascular: RRR, S1, S2, no m/r/g.  Pulmonary/Chest: Effort normal and breath sounds normal. No respiratory distress.  Neurological: AOx3 Psychiatric: Normal mood and somewhat anxious affect.  Vitals reviewed  GAD 7 : Generalized Anxiety Score 10/12/2015 09/13/2015 03/30/2015 02/15/2015  Nervous, Anxious, on Edge 1 3 2 3   Control/stop worrying 0 2 0 0  Worry too much - different things 1 2 0 0  Trouble relaxing 1 3 1 3   Restless 0 2 1 1   Easily annoyed or irritable 0 3 0 1  Afraid - awful might happen 0 1 1 3   Total GAD 7 Score 3 16 5 11   Anxiety Difficulty Extremely difficult Very difficult Somewhat difficult Not difficult at all    Depression screen [facility] 2/9 10/12/2015 07/18/2015 06/06/2015  Decreased Interest 1 0 0  Down, Depressed, Hopeless 0 0 0  PHQ - 2 Score 1 0 0  Altered sleeping 1 - -  Tired, decreased energy 2 - -  Change in appetite 1 - -  Feeling bad or failure about yourself  0 - -  Trouble concentrating 0 - -  Moving slowly or fidgety/restless 1 - -  Suicidal thoughts 0 - -  PHQ-9 Score 6 - -  Some recent data might be hidden    Assessment/Plan: Anxiety state - GAD-7 score much improved today at 3 compared to 16 at last visit, though patient says anxiety continues to make her life extremely difficult. Think improved GAD-7  score is partially due to normal BP reading today; had low BP at last visit, which patient continued to reference.  - Sleep difficulties seem to be most prominent symptom at present. Prescribed 0.5 mg ativan prn for anxiety-related insomnia. Counseled patient that this was a short-term medication that would not be continued long-term and that it is not a preferred medication for panic attackes. Goal is for her to work with psychiatry to find a safe anti-depressant alternative (perhaps TCA?) to treat anxiety with panic disorder. - Would not continue ativan longer than 4 months. CBT would be the safest long-term strategy given patient's  comorbidities. Patient to continue to see [facility] behavioral health team.   Healthcare maintenance - Flu vaccine administered - Handouts on mammogram and colonoscopy given - Plan to obtain BMP and HIV screen at next visit  - Based on lipid panel in April, ASCVD 10-year risk only 6.7%. Obtain repeat lipid panel next year.   Follow-up in a couple months for lab work and discuss psychiatry plan.  Olene Floss, MD [place], PGY-2

## 2015-10-12 NOTE — Patient Instructions (Addendum)
[name],  I recommend trying ativan 0.5 mg (half a tablet) before bed for anxiety. In the long-term, a long-acting antidepressant is best for treating panic disorder. Your psychiatrist will likely recommend transitioning off of ativan.  You are due for mammogram and colonoscopy.  Please follow-up in about 2 months to check labs.  Best, Dr. Ola Spurr

## 2015-10-14 MED ORDER — LORAZEPAM 1 MG PO TABS: ORAL_TABLET | ORAL | 1 refills | Status: DC

## 2015-10-14 NOTE — Assessment & Plan Note (Signed)
-   Flu vaccine administered - Handouts on mammogram and colonoscopy given - Plan to obtain BMP and HIV screen at next visit  - Based on lipid panel in April, ASCVD 10-year risk only 6.7%. Obtain repeat lipid panel next year.

## 2015-10-14 NOTE — Assessment & Plan Note (Signed)
-   GAD-7 score much improved today at 3 compared to 16 at last visit, though patient says anxiety continues to make her life extremely difficult. Think improved GAD-7 score is partially due to normal BP reading today; had low BP at last visit, which patient continued to reference.  - Sleep difficulties seem to be most prominent symptom at present. Prescribed 0.5 mg ativan prn for anxiety-related insomnia. Counseled patient that this was a short-term medication that would not be continued long-term and that it is not a preferred medication for panic attackes. Goal is for her to work with psychiatry to find a safe anti-depressant alternative (perhaps TCA?) to treat anxiety with panic disorder. - Would not continue ativan longer than 4 months. CBT would be the safest long-term strategy given patient's comorbidities. Patient to continue to see [facility] behavioral health team.

## 2015-10-24 ENCOUNTER — Encounter (HOSPITAL_COMMUNITY): Payer: Self-pay | Admitting: Psychiatry

## 2015-10-24 ENCOUNTER — Ambulatory Visit (INDEPENDENT_AMBULATORY_CARE_PROVIDER_SITE_OTHER): Payer: No Typology Code available for payment source | Admitting: Psychiatry

## 2015-10-24 VITALS — BP 128/72 | HR 76 | Ht 63.0 in | Wt 184.0 lb

## 2015-10-24 DIAGNOSIS — F411 Generalized anxiety disorder: Secondary | ICD-10-CM

## 2015-10-24 DIAGNOSIS — G4700 Insomnia, unspecified: Secondary | ICD-10-CM | POA: Diagnosis not present
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
## 2015-11-05 ENCOUNTER — Encounter (HOSPITAL_COMMUNITY): Payer: Self-pay | Admitting: *Deleted

## 2015-11-05 DIAGNOSIS — M5416 Radiculopathy, lumbar region: Secondary | ICD-10-CM | POA: Diagnosis present

## 2015-11-05 DIAGNOSIS — Z96653 Presence of artificial knee joint, bilateral: Secondary | ICD-10-CM | POA: Insufficient documentation

## 2015-11-05 DIAGNOSIS — E039 Hypothyroidism, unspecified: Secondary | ICD-10-CM | POA: Diagnosis not present

## 2015-11-05 DIAGNOSIS — E785 Hyperlipidemia, unspecified: Secondary | ICD-10-CM | POA: Diagnosis not present

## 2015-11-05 DIAGNOSIS — K219 Gastro-esophageal reflux disease without esophagitis: Secondary | ICD-10-CM | POA: Diagnosis not present

## 2015-11-05 DIAGNOSIS — F17210 Nicotine dependence, cigarettes, uncomplicated: Secondary | ICD-10-CM | POA: Insufficient documentation

## 2015-11-05 DIAGNOSIS — N183 Chronic kidney disease, stage 3 (moderate): Secondary | ICD-10-CM | POA: Diagnosis not present

## 2015-11-05 DIAGNOSIS — Z79899 Other long term (current) drug therapy: Secondary | ICD-10-CM | POA: Insufficient documentation

## 2015-11-05 DIAGNOSIS — M48061 Spinal stenosis, lumbar region without neurogenic claudication: Secondary | ICD-10-CM | POA: Insufficient documentation

## 2015-11-05 DIAGNOSIS — M109 Gout, unspecified: Secondary | ICD-10-CM | POA: Insufficient documentation

## 2015-11-05 DIAGNOSIS — M545 Low back pain: Secondary | ICD-10-CM | POA: Diagnosis not present

## 2015-11-05 DIAGNOSIS — Z7982 Long term (current) use of aspirin: Secondary | ICD-10-CM | POA: Insufficient documentation

## 2015-11-05 DIAGNOSIS — G8929 Other chronic pain: Secondary | ICD-10-CM | POA: Diagnosis not present

## 2015-11-05 DIAGNOSIS — Z981 Arthrodesis status: Secondary | ICD-10-CM | POA: Diagnosis not present

## 2015-11-05 DIAGNOSIS — F411 Generalized anxiety disorder: Secondary | ICD-10-CM | POA: Insufficient documentation

## 2015-11-05 DIAGNOSIS — M069 Rheumatoid arthritis, unspecified: Secondary | ICD-10-CM | POA: Diagnosis not present

## 2015-11-05 DIAGNOSIS — E7800 Pure hypercholesterolemia, unspecified: Secondary | ICD-10-CM | POA: Insufficient documentation

## 2015-11-05 DIAGNOSIS — I129 Hypertensive chronic kidney disease with stage 1 through stage 4 chronic kidney disease, or unspecified chronic kidney disease: Secondary | ICD-10-CM | POA: Diagnosis not present

## 2015-11-05 DIAGNOSIS — G894 Chronic pain syndrome: Secondary | ICD-10-CM | POA: Insufficient documentation

## 2015-11-05 DIAGNOSIS — Z86718 Personal history of other venous thrombosis and embolism: Secondary | ICD-10-CM | POA: Insufficient documentation

## 2015-11-05 DIAGNOSIS — K5909 Other constipation: Secondary | ICD-10-CM | POA: Insufficient documentation

## 2015-11-05 LAB — CBC
HCT: 46.8 % — ABNORMAL HIGH (ref 36.0–46.0)
HEMOGLOBIN: 15.2 g/dL — AB (ref 12.0–15.0)
MCH: 32 pg (ref 26.0–34.0)
MCHC: 32.5 g/dL (ref 30.0–36.0)
MCV: 98.5 fL (ref 78.0–100.0)
PLATELETS: 295 10*3/uL (ref 150–400)
RBC: 4.75 MIL/uL (ref 3.87–5.11)
RDW: 14 % (ref 11.5–15.5)
WBC: 10.7 10*3/uL — AB (ref 4.0–10.5)

## 2015-11-05 LAB — BASIC METABOLIC PANEL
ANION GAP: 8 (ref 5–15)
BUN: 11 mg/dL (ref 6–20)
CALCIUM: 9 mg/dL (ref 8.9–10.3)
CO2: 32 mmol/L (ref 22–32)
CREATININE: 1.17 mg/dL — AB (ref 0.44–1.00)
Chloride: 97 mmol/L — ABNORMAL LOW (ref 101–111)
GFR calc non Af Amer: 51 mL/min — ABNORMAL LOW (ref >60)
GFR, EST AFRICAN AMERICAN: 60 mL/min — AB (ref >60)
Glucose, Bld: 94 mg/dL (ref 65–99)
Potassium: 4 mmol/L (ref 3.5–5.1)
SODIUM: 137 mmol/L (ref 135–145)

## 2015-11-05 LAB — URINALYSIS, ROUTINE W REFLEX MICROSCOPIC
BILIRUBIN URINE: NEGATIVE
Glucose, UA: NEGATIVE mg/dL
Hgb urine dipstick: NEGATIVE
KETONES UR: NEGATIVE mg/dL
LEUKOCYTES UA: NEGATIVE
NITRITE: NEGATIVE
Protein, ur: NEGATIVE mg/dL
SPECIFIC GRAVITY, URINE: 1.016 (ref 1.005–1.030)
pH: 5 (ref 5.0–8.0)

## 2015-11-05 NOTE — ED Triage Notes (Signed)
Pt  C/o back pain for a few days. Pt describes pain as bilateral flank and spinal pain. Hx of back surgery. Reports decreased urinary output and incontinence. Reports hx of kidney failure. Pt takes oxycontin 40mg  for chronic pain

## 2015-11-06 ENCOUNTER — Encounter (HOSPITAL_COMMUNITY): Payer: Self-pay | Admitting: General Practice

## 2015-11-06 ENCOUNTER — Observation Stay (HOSPITAL_COMMUNITY)
Admission: EM | Admit: 2015-11-06 | Discharge: 2015-11-06 | Discharge status: Against Medical Advice | Payer: No Typology Code available for payment source | Attending: Family Medicine | Admitting: Family Medicine

## 2015-11-06 ENCOUNTER — Emergency Department (HOSPITAL_COMMUNITY): Payer: No Typology Code available for payment source

## 2015-11-06 DIAGNOSIS — M545 Low back pain, unspecified: Secondary | ICD-10-CM | POA: Diagnosis present

## 2015-11-06 DIAGNOSIS — M5416 Radiculopathy, lumbar region: Secondary | ICD-10-CM

## 2015-11-06 DIAGNOSIS — G8929 Other chronic pain: Secondary | ICD-10-CM | POA: Diagnosis not present

## 2015-11-06 DIAGNOSIS — M549 Dorsalgia, unspecified: Secondary | ICD-10-CM

## 2015-11-06 LAB — CREATININE, SERUM
CREATININE: 1.24 mg/dL — AB (ref 0.44–1.00)
GFR calc Af Amer: 56 mL/min — ABNORMAL LOW (ref >60)
GFR calc non Af Amer: 48 mL/min — ABNORMAL LOW (ref >60)

## 2015-11-06 LAB — CBC
HCT: 44 % (ref 36.0–46.0)
HEMOGLOBIN: 13.9 g/dL (ref 12.0–15.0)
MCH: 31.1 pg (ref 26.0–34.0)
MCHC: 31.6 g/dL (ref 30.0–36.0)
MCV: 98.4 fL (ref 78.0–100.0)
PLATELETS: 257 10*3/uL (ref 150–400)
RBC: 4.47 MIL/uL (ref 3.87–5.11)
RDW: 14.1 % (ref 11.5–15.5)
WBC: 9.6 10*3/uL (ref 4.0–10.5)

## 2015-11-06 MED ORDER — ACETAMINOPHEN 325 MG PO TABS: 650.0000 mg | ORAL_TABLET | Freq: Four times a day (QID) | ORAL | Status: DC | PRN

## 2015-11-06 MED ORDER — GABAPENTIN 300 MG PO CAPS: 300.0000 mg | ORAL_CAPSULE | Freq: Every day | ORAL | Status: DC

## 2015-11-06 MED ORDER — ONDANSETRON HCL 4 MG/2ML IJ SOLN
4.0000 mg | Freq: Once | INTRAMUSCULAR | Status: AC
  Administered 2015-11-06: 4 mg via INTRAVENOUS
  Filled 2015-11-06: qty 2

## 2015-11-06 MED ORDER — BACLOFEN 5 MG HALF TABLET
10.0000 mg | ORAL_TABLET | Freq: Every day | ORAL | Status: DC
  Filled 2015-11-06: qty 2

## 2015-11-06 MED ORDER — ALLOPURINOL 100 MG PO TABS
100.0000 mg | ORAL_TABLET | Freq: Every morning | ORAL | Status: DC
  Administered 2015-11-06: 100 mg via ORAL
  Filled 2015-11-06: qty 1

## 2015-11-06 MED ORDER — LORAZEPAM 0.5 MG PO TABS: 0.5000 mg | ORAL_TABLET | Freq: Every evening | ORAL | Status: DC | PRN

## 2015-11-06 MED ORDER — ATORVASTATIN CALCIUM 10 MG PO TABS
20.0000 mg | ORAL_TABLET | Freq: Every day | ORAL | Status: DC
  Administered 2015-11-06: 20 mg via ORAL
  Filled 2015-11-06: qty 2

## 2015-11-06 MED ORDER — LEVOTHYROXINE SODIUM 75 MCG PO TABS
75.0000 ug | ORAL_TABLET | Freq: Every day | ORAL | Status: DC
  Administered 2015-11-06: 75 ug via ORAL
  Filled 2015-11-06: qty 1

## 2015-11-06 MED ORDER — METOPROLOL SUCCINATE ER 25 MG PO TB24
25.0000 mg | ORAL_TABLET | Freq: Two times a day (BID) | ORAL | Status: DC
  Administered 2015-11-06: 25 mg via ORAL
  Filled 2015-11-06: qty 1

## 2015-11-06 MED ORDER — HYDROMORPHONE HCL 1 MG/ML IJ SOLN
2.0000 mg | Freq: Once | INTRAMUSCULAR | Status: AC
Start: 2015-11-06 — End: 2015-11-06
  Administered 2015-11-06: 2 mg via INTRAVENOUS
  Filled 2015-11-06: qty 2

## 2015-11-06 MED ORDER — ASPIRIN EC 81 MG PO TBEC
81.0000 mg | DELAYED_RELEASE_TABLET | Freq: Every day | ORAL | Status: DC
  Administered 2015-11-06: 81 mg via ORAL
  Filled 2015-11-06: qty 1

## 2015-11-06 MED ORDER — ONDANSETRON HCL 4 MG/2ML IJ SOLN: 4.0000 mg | Freq: Four times a day (QID) | INTRAMUSCULAR | Status: DC | PRN

## 2015-11-06 MED ORDER — LUBIPROSTONE 24 MCG PO CAPS
24.0000 ug | ORAL_CAPSULE | Freq: Two times a day (BID) | ORAL | Status: DC
  Filled 2015-11-06: qty 1

## 2015-11-06 MED ORDER — SODIUM CHLORIDE 0.9 % IV BOLUS (SEPSIS)
500.0000 mL | Freq: Once | INTRAVENOUS | Status: AC
  Administered 2015-11-06: 500 mL via INTRAVENOUS

## 2015-11-06 MED ORDER — HYDROMORPHONE HCL 1 MG/ML IJ SOLN
1.0000 mg | INTRAMUSCULAR | Status: DC | PRN
  Administered 2015-11-06 (×2): 1 mg via INTRAVENOUS
  Filled 2015-11-06 (×3): qty 1

## 2015-11-06 MED ORDER — TRAZODONE HCL 50 MG PO TABS: 50.0000 mg | ORAL_TABLET | Freq: Every evening | ORAL | Status: DC | PRN

## 2015-11-06 MED ORDER — POLYETHYLENE GLYCOL 3350 17 G PO PACK
17.0000 g | PACK | Freq: Two times a day (BID) | ORAL | Status: DC
  Administered 2015-11-06: 17 g via ORAL
  Filled 2015-11-06: qty 1

## 2015-11-06 MED ORDER — GABAPENTIN 300 MG PO CAPS
300.0000 mg | ORAL_CAPSULE | Freq: Three times a day (TID) | ORAL | Status: DC
  Administered 2015-11-06: 300 mg via ORAL
  Filled 2015-11-06: qty 1

## 2015-11-06 MED ORDER — LORAZEPAM 0.5 MG PO TABS: 0.5000 mg | ORAL_TABLET | Freq: Every day | ORAL | Status: DC

## 2015-11-06 MED ORDER — HEPARIN SODIUM (PORCINE) 5000 UNIT/ML IJ SOLN
5000.0000 [IU] | Freq: Three times a day (TID) | INTRAMUSCULAR | Status: DC
  Administered 2015-11-06: 5000 [IU] via SUBCUTANEOUS
  Filled 2015-11-06: qty 1

## 2015-11-06 MED ORDER — ACETAMINOPHEN 650 MG RE SUPP: 650.0000 mg | Freq: Four times a day (QID) | RECTAL | Status: DC | PRN

## 2015-11-06 MED ORDER — ONDANSETRON HCL 4 MG PO TABS: 4.0000 mg | ORAL_TABLET | Freq: Four times a day (QID) | ORAL | Status: DC | PRN

## 2015-11-06 MED ORDER — SENNOSIDES-DOCUSATE SODIUM 8.6-50 MG PO TABS
1.0000 | ORAL_TABLET | Freq: Two times a day (BID) | ORAL | Status: DC
  Administered 2015-11-06: 1 via ORAL
  Filled 2015-11-06: qty 1

## 2015-11-06 NOTE — Progress Notes (Signed)
FPTS Interim Progress Note  Ordered IR to perform epidural injection per Dr. Clarice Pole recommendation. Spoke with IR PA, Claiborne Billings, who states that no one is available to do epidural steroid injection in the inpatient setting until Wednesday (10/18). If patient still here on Wednesday, they can do it. Otherwise, patient may need to have this done outpatient. Will continue to follow neurosurgery's recommendations, appreciate Dr. Clarice Pole assistance.   Carlyle Dolly, MD 11/06/2015, 3:03 PM PGY-2, [place] [facility] Service pager [phone]

## 2015-11-06 NOTE — ED Notes (Signed)
Please call husband, Kasandra Knudsen, when patient returns to room [phone]

## 2015-11-06 NOTE — Progress Notes (Signed)
RN was notified by nurse tech that patient wanted to leave AMA. RN went into room to speak with patient. Patient began yelling at RN, states that RNs were not giving her medication today. See RN note.

## 2015-11-06 NOTE — ED Notes (Signed)
Pt being admitted for chronic back pain and pain control. Pt a&ox4. VSS. Pt is wearing 3L of O2. Pt ambulates with cane one assist. Last pain medication given at 08:07. Family at bedside. Admitting MD at bedside as well.

## 2015-11-06 NOTE — ED Notes (Signed)
Pt calling out for pain medication. RN informed pt that she would call admitting MD for pain medication. Pt educated to not take home pain medication while in the hospital. Pt demanding pain medication at this time. Admitting MD paged.

## 2015-11-06 NOTE — Progress Notes (Addendum)
I spoke with Dr. Ellene Route (Neurosurgery) over the phone. He will come by to see her this afternoon or evening. He is recommending that she receive an epidural steroid injection to help with her pain.  - Will order epidural steroid injection via IR - Will follow-up any new neurosurgery recs  Hyman Bible, MD PGY-2

## 2015-11-06 NOTE — Plan of Care (Signed)
Problem: Pain Managment: Goal: General experience of comfort will improve Outcome: Progressing See MAR for pain med needs  Comments: See MAR for pain meds given

## 2015-11-06 NOTE — Progress Notes (Signed)
Pt upset and said she had called out at 1600 for pain meds, nurse not notified. When nurse went in room pt was dressing to go home. Pt was adament about going home. Pt signed AMA form and I pulled her iv out. Security called to escort her out as she had several pieces of luggage w/her.
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
## 2015-11-06 NOTE — Progress Notes (Signed)
We do not have a neuro radiologist here for the next 2 days who can do the epidural injections.  This was relayed to the primary service.  If she is stable, she can be discharged and have this done as an outpatient.  If she remains inpatient, then this could be done later this week if still wanted.  Vicky Mccanless E 3:04 PM 11/06/2015

## 2015-11-06 NOTE — ED Provider Notes (Signed)
[facility] DEPT Provider Note   CSN: [id number] Arrival date & time: 11/05/15  [id number]  By signing my name below, I, Neta Mends, attest that this documentation has been prepared under the direction and in the presence of Orpah Greek, MD . Electronically Signed: Neta Mends, ED Scribe. 11/06/2015. 12:56 AM.    History   Chief Complaint Chief Complaint  Patient presents with  . Back Pain    The history is provided by the patient. No language interpreter was used.   HPI Comments:  [name] is a 55 y.o. female who presents to the Emergency Department complaining of constant back pain x 4 days. Pt states that the pain is worsened when moving which causes the pain to radiate down her elgs. Pt complains of difficulty urinating and dark/malordorous urine. Pt took a muscle relaxer, pain medication and medication for gout with some relief earlier, but now she is getting no relief. Pt notes that she has had back surgery 4 times. Pt sees Dr. Andree Elk for pain management. Pt denies other associated symptoms.  Past Medical History:  Diagnosis Date  . Allergy   . Ankylosing spondylitis ([place])   . Anxiety   . Arthritis   . Chronic pain syndrome   . CKD (chronic kidney disease) stage 3, GFR 30-59 ml/min   . Gastroparesis   . GERD (gastroesophageal reflux disease)   . Hyperlipidemia   . Hypertension   . Hypothyroidism   . Nephrolithiasis   . Obesity   . Osteoporosis   . Tobacco abuse     Patient Active Problem List   Diagnosis Date Noted  . GAD (generalized anxiety disorder) 10/24/2015  . Allergic rhinitis 07/18/2015  . Chest discomfort   . Precordial chest pain   . Chest pain 06/01/2015  . Encounter for smoking cessation counseling 11/29/2014  . Skin lesion 03/18/2014  . Headache 02/08/2014  . Tobacco abuse 02/08/2014  . Myalgia 07/22/2013  . Vertigo 05/12/2013  . Hip numbness 05/12/2013  . Gout 05/12/2013  . Ringing in ears 04/10/2013  . Obesity  (BMI 30-39.9) 12/31/2012  . Palpitations 06/10/2012  . Healthcare maintenance 04/08/2012  . Hot flashes 06/22/2011  . Coagulopathy ([place]) 12/14/2010  . Abnormal ECG 09/17/2010  . Vitamin D deficiency 05/18/2010  . Nonallopathic lesion of cervicothoracic region 05/18/2010  . ASCUS PAP 06/07/2008  . Chronic kidney disease (CKD), stage III (moderate) 06/10/2007  . Hypothyroidism 03/20/2006  . HYPERCHOLESTEROLEMIA 03/20/2006  . DEPRESSION, MAJOR, RECURRENT 03/20/2006  . Anxiety state 03/20/2006  . Essential hypertension 03/20/2006  . PEPTIC ULCER DIS., UNSPEC. W/O OBSTRUCTION 03/20/2006  . 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 Ankylosing spondylitis ([place]) 03/20/2006  . OSTEOPOROSIS, UNSPECIFIED 03/20/2006  . SCOLIOSIS 03/20/2006  . INSOMNIA NOS 03/20/2006    Past Surgical History:  Procedure Laterality Date  . APPENDECTOMY    . BACK SURGERY    . CHOLECYSTECTOMY    . DOPPLER ECHOCARDIOGRAPHY  09/26/2010   EF=>55%, LV norm  . NECK SURGERY    . NM MYOCAR PERF WALL MOTION  09/26/2010   Lexiscan EF 83%  EF may be overestimated due to LVH  . RIGHT KNEE ARTHROPLASTY    . TOTAL KNEE ARTHROPLASTY     left    OB History    No data available       Home Medications    Prior to Admission medications   Medication Sig Start Date End Date Taking? Authorizing Provider  allopurinol (ZYLOPRIM) 100 MG tablet Take 1  tablet (100 mg total) by mouth every morning. 11/22/14   Aquilla Hacker, MD  aspirin EC 81 MG tablet Take 81 mg by mouth daily.      Historical Provider, MD  atorvastatin (LIPITOR) 20 MG tablet Take 1 tablet (20 mg total) by mouth daily. 09/21/15   Hillary Corinda Gubler, MD  baclofen (LIORESAL) 10 MG tablet Take 10 mg by mouth at bedtime.  05/03/15   Historical Provider, MD  cetirizine (ZYRTEC) 10 MG tablet Take 1 tablet (10 mg total) by mouth daily. Patient not taking: Reported on 10/24/2015 07/18/15   Virginia Crews, MD  colchicine 0.6 MG tablet Take 1 tablet  (0.6 mg total) by mouth daily as needed. 09/19/15   Hillary Corinda Gubler, MD  gabapentin (NEURONTIN) 300 MG capsule Take 300 mg by mouth at bedtime.     Historical Provider, MD  lansoprazole (PREVACID) 30 MG capsule TAKE 1 CAPSULE BY MOUTH EVERY DAY 04/25/15   Aquilla Hacker, MD  levothyroxine (SYNTHROID, LEVOTHROID) 75 MCG tablet Take 1 tablet (75 mcg total) by mouth daily. 11/22/14   York Ram Melancon, MD  LORazepam (ATIVAN) 1 MG tablet Take 0.5 mg nightly as needed for insomnia. 10/12/15   Hillary Corinda Gubler, MD  lubiprostone (AMITIZA) 24 MCG capsule Take 24 mcg by mouth 2 (two) times daily with a meal.    Historical Provider, MD  metoprolol succinate (TOPROL-XL) 25 MG 24 hr tablet Take 1 tablet (25 mg total) by mouth 2 (two) times daily. 12/21/14   Rhonda G Barrett, PA-C  MOVANTIK 25 MG TABS tablet Take 25 mg by mouth as needed.  08/28/15   Historical Provider, MD  oxyCODONE (OXYCONTIN) 20 mg 12 hr tablet Take 20 mg by mouth every 12 (twelve) hours.    Historical Provider, MD  traZODone (DESYREL) 50 MG tablet Take 50 mg by mouth at bedtime.    Historical Provider, MD    Family History Family History  Problem Relation Age of Onset  . Diabetes Mother   . Heart failure Mother   . Hyperlipidemia Mother   . Hypertension Mother   . Hyperlipidemia Father   . Cancer Son     lymphoma stage 4 at 17    Social History Social History  Substance Use Topics  . Smoking status: Current Every Day Smoker    Packs/day: 1.00    Types: Cigarettes    Last attempt to quit: 10/12/2010  . Smokeless tobacco: Never Used  . Alcohol use No     Allergies   Codeine; Latex; and Acyclovir and related   Review of Systems Review of Systems  Genitourinary: Positive for difficulty urinating.       Positive for dark/malordourous urine  Musculoskeletal: Positive for back pain.  All other systems reviewed and are negative.    Physical Exam Updated Vital Signs BP (!) 92/53   Pulse 68   Temp 98.5 F  (36.9 C) (Oral)   Resp 12   LMP 05/17/2009   SpO2 98%   Physical Exam  Constitutional: She is oriented to person, place, and time. She appears well-developed and well-nourished. No distress.  HENT:  Head: Normocephalic and atraumatic.  Right Ear: Hearing normal.  Left Ear: Hearing normal.  Nose: Nose normal.  Mouth/Throat: Oropharynx is clear and moist and mucous membranes are normal.  Eyes: Conjunctivae and EOM are normal. Pupils are equal, round, and reactive to light.  Neck: Normal range of motion. Neck supple.  Cardiovascular: Regular rhythm, S1 normal and S2 normal.  Exam reveals no gallop and no friction rub.   No murmur heard. Pulmonary/Chest: Effort normal and breath sounds normal. No respiratory distress. She exhibits no tenderness.  Abdominal: Soft. Normal appearance and bowel sounds are normal. There is no hepatosplenomegaly. There is no tenderness. There is no rebound, no guarding, no tenderness at McBurney's point and negative Murphy's sign. No hernia.  Musculoskeletal: Normal range of motion.  Diffuse lumbar tenderness with muscle spasm.   Neurological: She is alert and oriented to person, place, and time. She has normal strength. No cranial nerve deficit or sensory deficit. Coordination normal. GCS eye subscore is 4. GCS verbal subscore is 5. GCS motor subscore is 6.  Skin: Skin is warm, dry and intact. No rash noted. No cyanosis.  Psychiatric: She has a normal mood and affect. Her speech is normal and behavior is normal. Thought content normal.  Nursing note and vitals reviewed.    ED Treatments / Results  DIAGNOSTIC STUDIES:  Oxygen Saturation is 96% on RA, normal by my interpretation.    COORDINATION OF CARE:  12:56 AM Discussed treatment plan with pt at bedside and pt agreed to plan.   Labs (all labs ordered are listed, but only abnormal results are displayed) Labs Reviewed  BASIC METABOLIC PANEL - Abnormal; Notable for the following:       Result Value     Chloride 97 (*)    Creatinine, Ser 1.17 (*)    GFR calc non Af Amer 51 (*)    GFR calc Af Amer 60 (*)    All other components within normal limits  CBC - Abnormal; Notable for the following:    WBC 10.7 (*)    Hemoglobin 15.2 (*)    HCT 46.8 (*)    All other components within normal limits  URINALYSIS, ROUTINE W REFLEX MICROSCOPIC (NOT AT [facility])    EKG  EKG Interpretation  Date/Time:  Monday November 06 2015 02:16:57 EDT Ventricular Rate:  89 PR Interval:    QRS Duration: 87 QT Interval:  325 QTC Calculation: 396 R Axis:   7 Text Interpretation:  Sinus rhythm Anterior infarct, old No significant change since last tracing Confirmed by Gervase Colberg  MD, Samone Guhl [phone]) on 11/06/2015 2:19:16 AM       Radiology Mr Lumbar Spine Wo Contrast  Result Date: 11/06/2015 CLINICAL DATA:  55 y/o  F; 08/16/2013 lumbar MRI. EXAM: MRI LUMBAR SPINE WITHOUT CONTRAST TECHNIQUE: Multiplanar, multisequence MR imaging of the lumbar spine was performed. No intravenous contrast was administered. COMPARISON:  08/16/2013 lumbar spine MRI FINDINGS: Segmentation:  Standard. Alignment:  Physiologic. Vertebrae: Posterior instrumented fusion, laminectomy, and interbody fusion at L4-5 with susceptibility artifact from fusion hardware partially obscuring the vertebral bodies and posterior elements at that level. Conus medullaris: Extends to the L1 level and appears normal. Paraspinal and other soft tissues: Atrophic left kidney with multiple simple appearing cysts. Disc levels: L1-2: No significant disc displacement, foraminal narrowing, or canal stenosis. L2-3: Small disc bulge and facet hypertrophy with trace effusions. Mild bilateral foraminal narrowing. No significant canal stenosis. L3-4: Small disc bulge and extensive facet/ ligamentum flavum hypertrophy with trace effusions. Moderate bilateral foraminal narrowing and severe canal stenosis increased in comparison with the prior MRI. L4-5: No significant  foraminal narrowing or canal stenosis. L5-S1:  No significant foraminal narrowing or canal stenosis. IMPRESSION: 1. Posterior instrumented fusion and laminectomy at the L4 and L5 levels. No acute osseous abnormality identified. 2. 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 Electronically Signed   By: Kristine Garbe M.D.   On: 11/06/2015 06:00    Procedures Procedures (including critical care time)  Medications Ordered in ED Medications  sodium chloride 0.9 % bolus 500 mL (0 mLs Intravenous Stopped 11/06/15 0645)  HYDROmorphone (DILAUDID) injection 2 mg (2 mg Intravenous Given 11/06/15 0202)  ondansetron (ZOFRAN) injection 4 mg (4 mg Intravenous Given 11/06/15 0202)     Initial Impression / Assessment and Plan / ED Course  I have reviewed the triage vital signs and the nursing notes.  Pertinent labs & imaging results that were available during my care of the patient were reviewed by me and considered in my medical decision making (see chart for details).  Clinical Course   Patient presents to the emergency room with complaints of severe low back pain. She does have a history of chronic back pain and has had multiple spinal surgeries. Her last surgery was 7 years ago. Patient denies any direct injury. She has been experiencing severe low back pain that is constant but worsens with any movement. She has noticed some urinary urgency and occasional incontinence. She cannot lift her legs off the bed secondary to severe pain but does have normal sensation and flexion extension at the ankles. No evidence of foot drop.  Patient administered analgesia. She has had only minimal pain control. She is already on large doses of narcotic analgesia as an outpatient. Additional Dilaudid here in the ER has caused desaturation secondary to hypoventilation but has not achieved good  pain control. MRI was performed, patient has severe disease at L3-4. Will ask her memory care physicians to admit her to the hospital for pain management. Her neurosurgeon is Dr. Kristeen Miss.  Final Clinical Impressions(s) / ED Diagnoses   Final diagnoses:  Lumbar radiculopathy    New Prescriptions New Prescriptions   No medications on file  I personally performed the services described in this documentation, which was scribed in my presence. The recorded information has been reviewed and is accurate.     Orpah Greek, MD 11/06/15 [phone]

## 2015-11-06 NOTE — Progress Notes (Signed)
Patient was escorted by security to car. She stated her husband was waiting for her. Security took Radiation protection practitioner with.

## 2015-11-06 NOTE — Progress Notes (Signed)
Teaching service was also notified of patient leaving AMA at 1740

## 2015-11-12 NOTE — Discharge Summary (Signed)
[facility] Discharge Summary  Patient name: [name] Medical record number: [id number] Date of birth: [birth year] Age: 55 y.o. Gender: female Date of Admission: 11/06/2015  Date of Discharge: Left AMA on 10/16 Admitting Physician: Zenia Resides, MD  Primary Care Provider: Lavon Paganini, MD Consultants: Neurosurgery  Indication for Hospitalization: acute on chronic back pain  Discharge Diagnoses/Problem List:  Patient Active Problem List   Diagnosis Date Noted  . Lumbar radiculopathy 11/06/2015  . Lower back pain 11/06/2015  . GAD (generalized anxiety disorder) 10/24/2015  . Allergic rhinitis 07/18/2015  . Chest discomfort   . Precordial chest pain   . Chest pain 06/01/2015  . Encounter for smoking cessation counseling 11/29/2014  . Skin lesion 03/18/2014  . Headache 02/08/2014  . Tobacco abuse 02/08/2014  . Myalgia 07/22/2013  . Vertigo 05/12/2013  . Hip numbness 05/12/2013  . Gout 05/12/2013  . Ringing in ears 04/10/2013  . Obesity (BMI 30-39.9) 12/31/2012  . Palpitations 06/10/2012  . Healthcare maintenance 04/08/2012  . Hot flashes 06/22/2011  . Coagulopathy ([place]) 12/14/2010  . Abnormal ECG 09/17/2010  . Vitamin D deficiency 05/18/2010  . Nonallopathic lesion of cervicothoracic region 05/18/2010  . ASCUS PAP 06/07/2008  . Chronic kidney disease (CKD), stage III (moderate) 06/10/2007  . Hypothyroidism 03/20/2006  . HYPERCHOLESTEROLEMIA 03/20/2006  . DEPRESSION, MAJOR, RECURRENT 03/20/2006  . Anxiety state 03/20/2006  . Essential hypertension 03/20/2006  . PEPTIC ULCER DIS., UNSPEC. W/O OBSTRUCTION 03/20/2006  . 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 Ankylosing spondylitis ([place]) 03/20/2006  . OSTEOPOROSIS, UNSPECIFIED 03/20/2006  . SCOLIOSIS 03/20/2006  . INSOMNIA NOS 03/20/2006    Disposition: ?  Discharge Condition: ?  Discharge Exam: did not examine as patient left [facility]  Brief Hospital Course:  [name] is a 55 y.o. female presenting with severe lower back pain. PMH is significant for ankylosing spondylitis, rheumatoid arthritis, chronic pain, HTN, CKD III, HLD, hypothyroidism, anxiety, gout, chronic constipation.  Chronic low back pain with acute worsening and radiculopathy.  Patient was admitted for acute on chronic low back pain. MRI lumbar spine in the ED with multifactorial adjacent segment disease at L3-L4 progressed from previous MRI, with moderate bilateral foraminal narrowing and moderate to severe canal stenosis. Neuro exam significant for decreased sensation to light touch of the right forearm and entire right lower extremity, 4/5 strength in lower extremities bilaterally (likely secondary to pain), blunted but symmetric reflexes, and normal anal sphincter tone. She was admitted to the hospital for pain control and neurosurgery consult. She was started on Dilaudid 1 mg q3 hrs. Her home Baclofen was continued and home Gabapentin increased. Spoke with Dr. Ellene Route (neurosurgeon) who recommended steroid epidural injection. PT/OT was consulted. Shortly after admission, patient left AMA for unknown reasons.   Issues for Follow Up:  1. Back Pain- follow up with neurosurgery  Significant Procedures: None  Significant Labs and Imaging:   Recent Labs Lab 11/06/15 1017  WBC 9.6  HGB 13.9  HCT 44.0  PLT 257    Recent Labs Lab 11/06/15 1017  CREATININE 1.24*    Mr Lumbar Spine Wo Contrast  Result Date: 11/06/2015 CLINICAL DATA:  55 y/o  F; 08/16/2013 lumbar MRI. EXAM: MRI LUMBAR SPINE WITHOUT CONTRAST TECHNIQUE: Multiplanar, multisequence MR imaging of the lumbar spine was performed. No intravenous contrast was administered. COMPARISON:  08/16/2013 lumbar spine MRI FINDINGS: Segmentation:  Standard. Alignment:  Physiologic. Vertebrae: Posterior instrumented fusion, laminectomy, and interbody fusion at L4-5 with susceptibility artifact from fusion  hardware partially obscuring the  vertebral bodies and posterior elements at that level. Conus medullaris: Extends to the L1 level and appears normal. Paraspinal and other soft tissues: Atrophic left kidney with multiple simple appearing cysts. Disc levels: L1-2: No significant disc displacement, foraminal narrowing, or canal stenosis. L2-3: Small disc bulge and facet hypertrophy with trace effusions. Mild bilateral foraminal narrowing. No significant canal stenosis. L3-4: Small disc bulge and extensive facet/ ligamentum flavum hypertrophy with trace effusions. Moderate bilateral foraminal narrowing and severe canal stenosis increased in comparison with the prior MRI. L4-5: No significant foraminal narrowing or canal stenosis. L5-S1:  No significant foraminal narrowing or canal stenosis. IMPRESSION: 1. Posterior instrumented fusion and laminectomy at the L4 and L5 levels. No acute osseous abnormality identified. 2. 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 Electronically Signed   By: Kristine Garbe M.D.   On: 11/06/2015 06:00    Results/Tests Pending at Time of Discharge: None  Discharge Medications:    Medication List    ASK your doctor about these medications   allopurinol 100 MG tablet Commonly known as:  ZYLOPRIM Take 1 tablet (100 mg total) by mouth every morning.   AMITIZA 24 MCG capsule Generic drug:  lubiprostone Take 24 mcg by mouth 2 (two) times daily with a meal.   aspirin EC 81 MG tablet Take 81 mg by mouth daily.   atorvastatin 20 MG tablet Commonly known as:  LIPITOR Take 1 tablet (20 mg total) by mouth daily.   baclofen 10 MG tablet Commonly known as:  LIORESAL Take 10 mg by mouth at bedtime.   cetirizine 10 MG tablet Commonly known as:  ZYRTEC Take 1 tablet (10 mg total) by mouth daily.   colchicine 0.6 MG tablet Take 1 tablet (0.6 mg total) by mouth daily as  needed.   gabapentin 300 MG capsule Commonly known as:  NEURONTIN Take 300 mg by mouth at bedtime.   lansoprazole 30 MG capsule Commonly known as:  PREVACID TAKE 1 CAPSULE BY MOUTH EVERY DAY   levothyroxine 75 MCG tablet Commonly known as:  SYNTHROID, LEVOTHROID Take 1 tablet (75 mcg total) by mouth daily.   LORazepam 1 MG tablet Commonly known as:  ATIVAN Take 0.5 mg nightly as needed for insomnia.   metoprolol succinate 25 MG 24 hr tablet Commonly known as:  TOPROL-XL Take 1 tablet (25 mg total) by mouth 2 (two) times daily.   MOVANTIK 25 MG Tabs tablet Generic drug:  naloxegol oxalate Take 25 mg by mouth as needed (for constipation if Amitiza isn't effective).   NYQUIL MULTI-SYMPTOM PO Take 5-15 mLs by mouth at bedtime as needed (for allergic symptoms).   OXYCONTIN 20 mg 12 hr tablet Generic drug:  oxyCODONE Take 20 mg by mouth every 12 (twelve) hours.   traZODone 50 MG tablet Commonly known as:  DESYREL Take 50 mg by mouth at bedtime.       Discharge Instructions: Please refer to Patient Instructions section of EMR for full details.  Patient was counseled important signs and symptoms that should prompt return to medical care, changes in medications, dietary instructions, activity restrictions, and follow up appointments.   Follow-Up Appointments:   Carlyle Dolly, MD 11/12/2015, 9:05 PM PGY-2, [place]

## 2015-11-29 ENCOUNTER — Other Ambulatory Visit: Payer: Self-pay | Admitting: Physician Assistant

## 2015-12-04 ENCOUNTER — Ambulatory Visit (INDEPENDENT_AMBULATORY_CARE_PROVIDER_SITE_OTHER): Payer: No Typology Code available for payment source | Admitting: Cardiology

## 2015-12-04 ENCOUNTER — Telehealth (HOSPITAL_COMMUNITY): Payer: Self-pay

## 2015-12-04 ENCOUNTER — Encounter: Payer: Self-pay | Admitting: Cardiology

## 2015-12-04 VITALS — BP 144/84 | HR 66 | Ht 64.0 in | Wt 183.6 lb

## 2015-12-04 DIAGNOSIS — I10 Essential (primary) hypertension: Secondary | ICD-10-CM

## 2015-12-04 DIAGNOSIS — R002 Palpitations: Secondary | ICD-10-CM | POA: Diagnosis not present

## 2015-12-04 DIAGNOSIS — R072 Precordial pain: Secondary | ICD-10-CM | POA: Diagnosis not present

## 2015-12-04 DIAGNOSIS — Z720 Tobacco use: Secondary | ICD-10-CM

## 2015-12-04 DIAGNOSIS — F411 Generalized anxiety disorder: Secondary | ICD-10-CM

## 2015-12-04 DIAGNOSIS — R9431 Abnormal electrocardiogram [ECG] [EKG]: Secondary | ICD-10-CM

## 2015-12-04 MED ORDER — METOPROLOL SUCCINATE ER 25 MG PO TB24: 37.5000 mg | ORAL_TABLET | Freq: Two times a day (BID) | ORAL | 11 refills | Status: DC

## 2015-12-04 NOTE — Patient Instructions (Signed)
INCREASE METOPROLOL SUCCINATE TO 37.5 MG  TWICE A DAY ( ONE TABLET PLUS HALF TABLET OF 50 MG ).  MAY TAKE 1 OR 2 TABLETS IF NEEDED A DAY.   Your physician recommends that you schedule a follow-up appointment in: [place] wants you to follow-up in: [place].You will receive a reminder letter in the mail two months in advance. If you don't receive a letter, please call our office to schedule the follow-up appointment.   If you need a refill on your cardiac medications before your next appointment, please call your pharmacy.
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
## 2015-12-04 NOTE — Telephone Encounter (Signed)
Patient stopped by and is asking for a refill on her ativan, she had her initial appointment on 10/3 and you state in the plan that the prescription will be taken over by psychiatry. She is currently on 1 mg 1/2 to 1 at bed time and 1/2 to 1 for a panic attack. Patient last got filled on 10/17 and has one left. Please review and advise, do you want me to call in the same rx?

## 2015-12-05 ENCOUNTER — Other Ambulatory Visit: Payer: Self-pay | Admitting: *Deleted

## 2015-12-05 ENCOUNTER — Encounter: Payer: Self-pay | Admitting: Cardiology

## 2015-12-05 NOTE — Assessment & Plan Note (Signed)
She deathly has anxiety, and her chest pain, palpitations or all associated with this. Will defer to her other providers Owens Shark to manage this. I think that as long as she has this overwhelming symptom, she will continue to have episodes of chest pain and continued to have evaluation that is not necessary.

## 2015-12-05 NOTE — Assessment & Plan Note (Signed)
Not is well-controlled on current dose of beta blocker. She is having to take additional doses. Plan will be to increase the Toprol to 37.5 twice a day. We may potentially even goes far 50 twice a day if she continues to use a when necessary additional dose on a frequent basis.  Mostly a think she is needs her anxiety treated. She is on a limited dose of lorazepam which probably is getting more effective. What I don't see her on is a long-acting anxiolytic/SSRI type medication.

## 2015-12-05 NOTE — Telephone Encounter (Signed)
Prescription says refill on 10/21. She was given a refill on the original script

## 2015-12-05 NOTE — Assessment & Plan Note (Signed)
Smoking cessation instruction/counseling given:  counseled patient on the dangers of tobacco use, advised patient to stop smoking, and reviewed strategies to maximize success -- at least now she is a knowledge and the issue and is working on it. But not yet ready to quit

## 2015-12-05 NOTE — Assessment & Plan Note (Signed)
Her pain does not sound anginal in nature at all. She's had a relatively normal follow-up Myoview now in the setting of persistent symptoms to suggest that these are not anginal symptoms. Probably most likely musculoskeletal discomfort related to her anxiety. If the palpitations are negative worse, increasing beta blocker will help. But otherwise just simply recommend discussing with PCP for other potential symptoms.

## 2015-12-06 MED ORDER — LEVOTHYROXINE SODIUM 75 MCG PO TABS: 75.0000 ug | ORAL_TABLET | Freq: Every day | ORAL | 4 refills | Status: DC

## 2015-12-20 ENCOUNTER — Ambulatory Visit: Payer: No Typology Code available for payment source | Admitting: Cardiology

## 2016-01-02 ENCOUNTER — Ambulatory Visit (INDEPENDENT_AMBULATORY_CARE_PROVIDER_SITE_OTHER): Payer: No Typology Code available for payment source | Admitting: Psychiatry

## 2016-01-02 ENCOUNTER — Other Ambulatory Visit (HOSPITAL_COMMUNITY): Payer: Self-pay

## 2016-01-02 ENCOUNTER — Ambulatory Visit (HOSPITAL_COMMUNITY): Payer: Self-pay | Admitting: Psychiatry

## 2016-01-02 ENCOUNTER — Encounter (HOSPITAL_COMMUNITY): Payer: Self-pay | Admitting: Psychiatry

## 2016-01-02 VITALS — BP 124/78 | HR 74 | Ht 64.0 in | Wt 180.4 lb

## 2016-01-02 DIAGNOSIS — Z8349 Family history of other endocrine, nutritional and metabolic diseases: Secondary | ICD-10-CM

## 2016-01-02 DIAGNOSIS — Z91040 Latex allergy status: Secondary | ICD-10-CM

## 2016-01-02 DIAGNOSIS — F411 Generalized anxiety disorder: Secondary | ICD-10-CM

## 2016-01-02 DIAGNOSIS — Z888 Allergy status to other drugs, medicaments and biological substances status: Secondary | ICD-10-CM

## 2016-01-02 DIAGNOSIS — G4701 Insomnia due to medical condition: Secondary | ICD-10-CM | POA: Diagnosis not present

## 2016-01-02 DIAGNOSIS — Z79891 Long term (current) use of opiate analgesic: Secondary | ICD-10-CM

## 2016-01-02 DIAGNOSIS — Z9889 Other specified postprocedural states: Secondary | ICD-10-CM

## 2016-01-02 DIAGNOSIS — Z7982 Long term (current) use of aspirin: Secondary | ICD-10-CM

## 2016-01-02 DIAGNOSIS — F17210 Nicotine dependence, cigarettes, uncomplicated: Secondary | ICD-10-CM

## 2016-01-02 DIAGNOSIS — Z833 Family history of diabetes mellitus: Secondary | ICD-10-CM

## 2016-01-02 DIAGNOSIS — F419 Anxiety disorder, unspecified: Secondary | ICD-10-CM

## 2016-01-02 DIAGNOSIS — Z807 Family history of other malignant neoplasms of lymphoid, hematopoietic and related tissues: Secondary | ICD-10-CM

## 2016-01-02 DIAGNOSIS — Z79899 Other long term (current) drug therapy: Secondary | ICD-10-CM

## 2016-01-02 DIAGNOSIS — Z8249 Family history of ischemic heart disease and other diseases of the circulatory system: Secondary | ICD-10-CM

## 2016-01-02 MED ORDER — LORAZEPAM 1 MG PO TABS: ORAL_TABLET | ORAL | 2 refills | Status: DC

## 2016-01-02 MED ORDER — TRAZODONE HCL 50 MG PO TABS: 50.0000 mg | ORAL_TABLET | Freq: Every day | ORAL | 2 refills | Status: DC
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
## 2016-01-03 ENCOUNTER — Telehealth: Payer: Self-pay | Admitting: Cardiology

## 2016-01-03 NOTE — Telephone Encounter (Signed)
Every now and then drinking Gatorade to help keeping electrolytes stable is helpful. But for the most part I would drink water. I agree with adequate hydration and keep urine clear. This will help avoid dizziness and palpitations.  Water is better overall Gatorade.  Glenetta Hew, MD

## 2016-01-03 NOTE — Telephone Encounter (Signed)
Spoke with patient states blood pressure has increased since taking Gatorade .  patient state [name] instructed her drink liquid to keep hydrated   RN  Informed patient to drink water  Until urine is clear on daily bases.  patient states she understood and wanted dr Paul to be aware. rn will forward to dr Paul.

## 2016-01-03 NOTE — Telephone Encounter (Signed)
°  Pt says she has been drinking Gatorade to keep hydrated, states that it was making heart pound. Made bp go up extremely high. It has been as high as 139/101. Pt wants to know what she can drink in place of Gatorade? Please call pt

## 2016-01-03 NOTE — Telephone Encounter (Signed)
Pt says she has been drinking Gatorade to keep hydrated, states that it was making heart pound. Made bp go up extremely high. It has been as high as 139/101. Pt wants to know what she can drink in place of Gatorade? Please call pt

## 2016-01-04 NOTE — Telephone Encounter (Signed)
Left detailed message of Dr Ellyn Hack instruction on secure voicemail  Any question may call back

## 2016-01-05 ENCOUNTER — Other Ambulatory Visit: Payer: Self-pay | Admitting: Neurological Surgery

## 2016-01-10 ENCOUNTER — Other Ambulatory Visit: Payer: Self-pay | Admitting: *Deleted

## 2016-01-11 MED ORDER — ALLOPURINOL 100 MG PO TABS: 100.0000 mg | ORAL_TABLET | Freq: Every morning | ORAL | 5 refills | Status: DC

## 2016-01-16 ENCOUNTER — Telehealth: Payer: Self-pay | Admitting: Cardiology

## 2016-01-16 NOTE — Telephone Encounter (Signed)
New Message  Pt c/o BP issue: STAT if pt c/o blurred vision, one-sided weakness or slurred speech  1. What are your last 5 BP readings? 9 pm 146/103, this morning 143/93 and most recent 156/105  2. Are you having any other symptoms (ex. Dizziness, headache, blurred vision, passed out)? Lightheaded, headaches, sick-on-stomach, & Blurred vision-intermittently.  3. What is your BP issue? Pt voiced not feeling well yesterday/today, also, feels like heart is coming through her chest. Pt voiced took anxiety and bp meds last night due to high bp and bp came down but still higher than normal per pt. Pt voiced felt like she was going to vomit this morning. Pt voiced took bp medicine and anxiety meds again and pt bp is still higher than normal.

## 2016-01-16 NOTE — Telephone Encounter (Signed)
Call from patient put through. She has several nonspecific concerns that including lightheadedness, headaches, nausea, generally "not feeling well" are due to her BP running up. She notes pressures as recorded in operator's note. States that her typical readings are more 100s/60s.  She goes on to explain that she is not feeling well, but declines to elicit further information. She denies that she is having acute URI symptoms or is taking new medications. Patient thinks this is anxiety driven, though reports that intervention w anxiolytic failed to produce discernable change to BP.  I've encouraged her to wait, rest, then check her BP at rest/w anxiety controlled, and see if readings are similarly elevated.  Unsure how to further advise - routed to Dr. Ellyn Hack to address further.

## 2016-01-19 ENCOUNTER — Ambulatory Visit (INDEPENDENT_AMBULATORY_CARE_PROVIDER_SITE_OTHER): Payer: No Typology Code available for payment source | Admitting: *Deleted

## 2016-01-19 VITALS — BP 144/94 | HR 71

## 2016-01-19 DIAGNOSIS — Z9289 Personal history of other medical treatment: Secondary | ICD-10-CM | POA: Diagnosis not present

## 2016-01-19 DIAGNOSIS — I10 Essential (primary) hypertension: Secondary | ICD-10-CM | POA: Diagnosis not present

## 2016-01-19 NOTE — Telephone Encounter (Signed)
Spoke w patient. She notes she is still having problems w her BP. Very anxious. No other symptoms. As noted, we discussed her symptoms earlier in the week. She voices that she would have a lot of reassurance if she could come in for an EKG & BP check today. I advised OK - let her know I will do this & have added to nurse schedule. Will review findings w [facility]D (routing this note as FYI)

## 2016-01-19 NOTE — Telephone Encounter (Signed)
EKG signed by Dr. Martinique. Nurse visit encounter charted and sent to Dr. Ellyn Hack for review.

## 2016-01-19 NOTE — Patient Instructions (Signed)
I will inform Dr. Ellyn Hack of your visit today. Let him review your chart and today's findings. I'll follow up with physician's instructions. Call if you have new concerns in the meantime.

## 2016-01-19 NOTE — Progress Notes (Signed)
Saw patient for nurse visit today. Note EKG performed. Confirmed NSR w signature by Dr. Martinique. I have had thorough discussion w patient regarding her BPs and anxiety. This is a carryover conversation from a triage call placed this week. Note that she does often check her BP in response to anxiety, but is also checking it at rest 2-3 times a day regardless. Will route to Dr. Ellyn Hack for review. While patient does note the expected situation that her BP will increase w episodes of anxiety (such as a 150/110 reading she obtained and reported today), she thinks there is an element of this that is not explained from just anxiety. Patient keeps a thorough diary of BP and is attentive to note when and if she has symptoms. I reviewed her BP journal entries from the past few months. She has had a noticeable increase in her BP trends at rest, independent of anxiety.  A few months ago her trends were 100-110/60-70. She's now more commonly at 125-145/85-100.  Seems lowest reading she's obtained in the past 3-4 weeks was 125/74. She is very unused to this. Wonders if she is having more anxiety due to being able to tell her BP is running higher. Pt aware I will seek a review of this by Dr. Ellyn Hack, and see if BP med adjustment is warranted.  She notes she takes her metoprolol BID, but sometimes takes a PRN dose if her anxiety medication fails to improve symptoms during her BP spikes. I informed her I would see if Dr. Ellyn Hack recommended a baseline dose increase to this med instead, or other med recommendation.  Patient was agreeable to waiting on Dr. Allison Quarry advice. I will follow up with her accordingly.

## 2016-01-19 NOTE — Telephone Encounter (Signed)
Pt's blood pressure is up to 155/110. She wants to come in for an ekg please.

## 2016-01-25 ENCOUNTER — Encounter (HOSPITAL_COMMUNITY)
Admission: RE | Admit: 2016-01-25 | Discharge: 2016-01-25 | Disposition: A | Discharge status: Home / Self Care | Payer: No Typology Code available for payment source | Source: Ambulatory Visit | Attending: Neurological Surgery | Admitting: Neurological Surgery

## 2016-01-25 ENCOUNTER — Encounter (HOSPITAL_COMMUNITY): Payer: Self-pay

## 2016-01-25 ENCOUNTER — Ambulatory Visit (HOSPITAL_COMMUNITY): Payer: Self-pay | Admitting: Psychiatry

## 2016-01-25 ENCOUNTER — Encounter (HOSPITAL_COMMUNITY): Payer: Self-pay | Admitting: Vascular Surgery

## 2016-01-25 DIAGNOSIS — Z01818 Encounter for other preprocedural examination: Secondary | ICD-10-CM | POA: Diagnosis present

## 2016-01-25 DIAGNOSIS — M4316 Spondylolisthesis, lumbar region: Secondary | ICD-10-CM | POA: Diagnosis not present

## 2016-01-25 HISTORY — DX: Dependence on supplemental oxygen: Z99.81

## 2016-01-25 LAB — CBC
HCT: 51.3 % — ABNORMAL HIGH (ref 36.0–46.0)
HEMOGLOBIN: 16.9 g/dL — AB (ref 12.0–15.0)
MCH: 31.7 pg (ref 26.0–34.0)
MCHC: 32.9 g/dL (ref 30.0–36.0)
MCV: 96.2 fL (ref 78.0–100.0)
Platelets: 225 10*3/uL (ref 150–400)
RBC: 5.33 MIL/uL — AB (ref 3.87–5.11)
RDW: 16.7 % — ABNORMAL HIGH (ref 11.5–15.5)
WBC: 9.2 10*3/uL (ref 4.0–10.5)

## 2016-01-25 LAB — BASIC METABOLIC PANEL
Anion gap: 7 (ref 5–15)
BUN: 9 mg/dL (ref 6–20)
CHLORIDE: 98 mmol/L — AB (ref 101–111)
CO2: 31 mmol/L (ref 22–32)
Calcium: 9.2 mg/dL (ref 8.9–10.3)
Creatinine, Ser: 1.41 mg/dL — ABNORMAL HIGH (ref 0.44–1.00)
GFR calc non Af Amer: 41 mL/min — ABNORMAL LOW (ref >60)
GFR, EST AFRICAN AMERICAN: 48 mL/min — AB (ref >60)
Glucose, Bld: 102 mg/dL — ABNORMAL HIGH (ref 65–99)
POTASSIUM: 4.4 mmol/L (ref 3.5–5.1)
SODIUM: 136 mmol/L (ref 135–145)

## 2016-01-25 LAB — SURGICAL PCR SCREEN
MRSA, PCR: NEGATIVE
Staphylococcus aureus: NEGATIVE

## 2016-01-25 NOTE — Progress Notes (Signed)
PCP: Dr. Ola Spurr @ [facility] Cardiologist: Dr. Ellyn Hack  Pt. On oxygen 2 liter at night
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
## 2016-01-25 NOTE — Pre-Procedure Instructions (Signed)
    KAYBRI MCROBERTS  01/25/2016      [id number] [place], [place] [phone] [facility] [place] [address] [place] [address] Phone: [phone] Fax: [phone]    Your procedure is scheduled on Tues., Jan. 9  Report to [facility] at 8:15 A.M.  Call this number if you have problems the morning of surgery:  [phone]   Remember:  Do not eat food or drink liquids after midnight on Mon., Jan 8   Take these medicines the morning of surgery with A SIP OF WATER : allopurinol, lansoprazole (prevacid), levothyroxine (synthroid),lorazepam (ativan), metoprolol succinate (toprol-xl), oxycodone is needed             Stop aspirin, advil, motrin, aleve,BC Powders, goody's,    Do not wear jewelry, make-up or nail polish.  Do not wear lotions, powders, or perfumes, or deoderant.  Do not shave 48 hours prior to surgery.  Men may shave face and neck.  Do not bring valuables to the hospital.  [facility] is not responsible for any belongings or valuables.  Contacts, dentures or bridgework may not be worn into surgery.  Leave your suitcase in the car.  After surgery it may be brought to your room.  For patients admitted to the hospital, discharge time will be determined by your treatment team.  Patients discharged the day of surgery will not be allowed to drive home.    Special instructions:  Review preparing for surgery  Please read over the following fact sheets that you were given. Coughing and Deep Breathing and MRSA Information

## 2016-01-25 NOTE — Anesthesia Preprocedure Evaluation (Deleted)
Anesthesia Evaluation    Airway        Dental   Pulmonary Current Smoker,           Cardiovascular hypertension,      Neuro/Psych    GI/Hepatic   Endo/Other    Renal/GU      Musculoskeletal   Abdominal   Peds  Hematology   Anesthesia Other Findings   Reproductive/Obstetrics                             Anesthesia Physical Anesthesia Plan  ASA:   Anesthesia Plan:    Post-op Pain Management:    Induction:   Airway Management Planned:   Additional Equipment:   Intra-op Plan:   Post-operative Plan:   Informed Consent:   Plan Discussed with:   Anesthesia Plan Comments: (See my anesthesia note re: anesthesia concerns (bold print). Myra Gianotti, PA-C)        Anesthesia Quick Evaluation

## 2016-01-26 ENCOUNTER — Telehealth: Payer: Self-pay | Admitting: Cardiology

## 2016-01-26 ENCOUNTER — Other Ambulatory Visit: Payer: Self-pay | Admitting: *Deleted

## 2016-01-26 DIAGNOSIS — R002 Palpitations: Secondary | ICD-10-CM

## 2016-01-26 DIAGNOSIS — I10 Essential (primary) hypertension: Secondary | ICD-10-CM

## 2016-01-26 LAB — TYPE AND SCREEN
ABO/RH(D): O POS
Antibody Screen: POSITIVE
DAT, IGG: NEGATIVE
PT AG Type: POSITIVE
Unit division: 0
Unit division: 0

## 2016-01-26 NOTE — Telephone Encounter (Signed)
Dr. Ellyn Hack... Following up on last week's nurse encounter and recommendation to increase her metoprolol. Spoke to patient. Noted, she is already on 1.5 tablets BID of the metoprolol (37.5mg ). She is also taking 1-2 tablets PRN. Should we go ahead and move her up to 50mg  BID?  Please advise... Thank you!

## 2016-01-26 NOTE — Progress Notes (Signed)
Rx sent to local pharmacy. I've left pt detailed msg w instructions, advised to call if questions or clarification needed.

## 2016-01-26 NOTE — Telephone Encounter (Signed)
New Message     [facility]  Very important you call her back, they have already changed her medication

## 2016-01-30 ENCOUNTER — Encounter (HOSPITAL_COMMUNITY): Admission: RE | Payer: Self-pay | Source: Ambulatory Visit

## 2016-01-30 ENCOUNTER — Inpatient Hospital Stay (HOSPITAL_COMMUNITY)
Admission: RE | Admit: 2016-01-30 | Payer: No Typology Code available for payment source | Source: Ambulatory Visit | Admitting: Neurological Surgery

## 2016-01-30 SURGERY — POSTERIOR LUMBAR FUSION 1 LEVEL
Anesthesia: General | Site: Back

## 2016-01-30 NOTE — Telephone Encounter (Signed)
Yes DH 

## 2016-01-31 ENCOUNTER — Telehealth: Payer: Self-pay | Admitting: Cardiology

## 2016-01-31 DIAGNOSIS — I10 Essential (primary) hypertension: Secondary | ICD-10-CM

## 2016-01-31 DIAGNOSIS — R002 Palpitations: Secondary | ICD-10-CM

## 2016-01-31 MED ORDER — METOPROLOL SUCCINATE ER 50 MG PO TB24: 50.0000 mg | ORAL_TABLET | Freq: Two times a day (BID) | ORAL | 3 refills | Status: DC

## 2016-01-31 NOTE — Telephone Encounter (Signed)
Follow Up   Patient returning phone call. Stated that she has some questions regarding voicemail left.

## 2016-01-31 NOTE — Telephone Encounter (Signed)
Outgoing call to patient goes to VM. Per DPR and prior discussion w patient, OK to leave detailed msg w instructions on her VM. I have done so, w advice to go ahead and increase dose as specified and to call if she has questions about this. Rx sent to pharmacy.

## 2016-02-01 ENCOUNTER — Ambulatory Visit (HOSPITAL_COMMUNITY): Payer: Self-pay | Admitting: Psychiatry

## 2016-02-01 MED ORDER — METOPROLOL SUCCINATE ER 50 MG PO TB24: 50.0000 mg | ORAL_TABLET | Freq: Two times a day (BID) | ORAL | 3 refills | Status: DC

## 2016-02-01 NOTE — Telephone Encounter (Signed)
Spoke to patient.  She voiced understanding of recommendations on dosage increase. Cites concern that med cost has gone up. She's not sure if the 50mg  dose is more expensive. She will contact pharmacy and find out if it would be cheaper to write for the 25mg  tablets at equivalent dosing, informed her I would be happy to send it in for her this way if that's the case.   She acknowledged, and will call them and follow up w [place].

## 2016-02-01 NOTE — Telephone Encounter (Signed)
States she needs refill to notify pharmacy of increase. Also to specify the PRN use so they will refill for larger count. She has enough of her current pills to last until her renewal date of Jan 21st. She's aware to call though, if she has issues, so that we may contact pharmacy and help. Got this worked out with patient.  Rx sent to pharmacy for updated count - patient aware, voiced thanks.

## 2016-02-05 ENCOUNTER — Telehealth: Payer: Self-pay | Admitting: Family Medicine

## 2016-02-05 ENCOUNTER — Ambulatory Visit: Payer: Self-pay | Admitting: Internal Medicine

## 2016-02-05 NOTE — Telephone Encounter (Signed)
Pt wanted to let Dr. Brett Albino know that back in 2012, around Thanksgiving pt went into a coma with liver and kidney failure. Symptoms prior to that were foul smelling urine and unable to urinate. Pt is experiencing  these symptoms again and wanted to make you aware. Pt's appt is tomorrow @ 11:30am ep

## 2016-02-05 NOTE — Telephone Encounter (Signed)
Will forward to MD to make her aware.  Yohan Samons,CMA  

## 2016-02-05 NOTE — Telephone Encounter (Signed)
Great, happy that she has an appointment. We will see her tomorrow at 11:30AM.

## 2016-02-06 ENCOUNTER — Telehealth: Payer: Self-pay | Admitting: Family Medicine

## 2016-02-06 ENCOUNTER — Ambulatory Visit (INDEPENDENT_AMBULATORY_CARE_PROVIDER_SITE_OTHER): Payer: No Typology Code available for payment source | Admitting: Internal Medicine

## 2016-02-06 VITALS — BP 110/72 | HR 75 | Temp 97.7°F | Ht 64.0 in | Wt 181.0 lb

## 2016-02-06 DIAGNOSIS — R39198 Other difficulties with micturition: Secondary | ICD-10-CM | POA: Insufficient documentation

## 2016-02-06 DIAGNOSIS — M5416 Radiculopathy, lumbar region: Secondary | ICD-10-CM | POA: Diagnosis not present

## 2016-02-06 DIAGNOSIS — M4726 Other spondylosis with radiculopathy, lumbar region: Secondary | ICD-10-CM | POA: Diagnosis not present

## 2016-02-06 DIAGNOSIS — M545 Low back pain: Secondary | ICD-10-CM | POA: Diagnosis not present

## 2016-02-06 DIAGNOSIS — M4727 Other spondylosis with radiculopathy, lumbosacral region: Secondary | ICD-10-CM | POA: Diagnosis not present

## 2016-02-06 LAB — POCT URINALYSIS DIPSTICK
Bilirubin, UA: NEGATIVE
Glucose, UA: NEGATIVE
Leukocytes, UA: NEGATIVE
Nitrite, UA: NEGATIVE
PH UA: 6
PROTEIN UA: 30
RBC UA: NEGATIVE
SPEC GRAV UA: 1.025
Urobilinogen, UA: 0.2

## 2016-02-06 NOTE — Telephone Encounter (Signed)
Referral placed for colonoscopy.  Typically patients do not need referral for mammogram.  Will send to referral coordinator.  Virginia Crews, MD, MPH PGY-3,  [facility] 02/06/2016 1:56 PM

## 2016-02-06 NOTE — Telephone Encounter (Signed)
Patient came in stated needs referral for mammogram and colonoscopy. Please follow up with patient.

## 2016-02-06 NOTE — Telephone Encounter (Signed)
Patient should not need referral for mammogram UNLESS she is having an issue with her breast. If she is needing a regular mammogram, she can just call the [facility] to schedule.

## 2016-02-06 NOTE — Telephone Encounter (Signed)
Patient came in to request referral for mammogram and colonoscopy Please call patient  stated

## 2016-02-06 NOTE — Progress Notes (Signed)
   [facility] Phone: [phone]  Subjective:  [name] is a 56 year old female presenting to clinic for a same day visit to discuss problems urinating for the last year. She states she has a normal amount of urine in the morning, but her urine is orange and smells weird. Then for the rest of the day, she only has dribbles of urine. She notes urinary urgency multiple times throughout the day. No dysuria. She also has some urinary incontinence with coughing or sneezing. No fecal incontinence.   ROS: See HPI for pertinent positives and negatives  Past Medical History- HTN, hypothyroidism, lumbar radiculopathy, CKD III, HLD, depression, anxiety, RA  Family history reviewed for today's visit. No changes.  Social history- patient is a current smoker.  Objective: BP 110/72 (BP Location: Right Arm, Patient Position: Sitting, Cuff Size: Normal)   Pulse 75   Temp 97.7 F (36.5 C) (Oral)   Ht 5\' 4"  (1.626 m)   Wt 181 lb (82.1 kg)   LMP 05/17/2009 (LMP Unknown)   SpO2 98%   BMI 31.07 kg/m  Gen: NAD, alert, cooperative with exam GI: +BS, soft, non-tender, non-distended GU: External genitalia normal in appearance, no masses, no uterine prolapse noted  Assessment/Plan: Difficulty urinating: Has been going on for a year, but she has never seen a doctor for this. She is able to pee normally first thing in the morning, but then has urgency and dribbling the rest of the day. Drinking normally. Sounds like she also has chronic stress incontinence as well. Oxycodone could be contributing, but Pt has been on this since the '90s. UA performed in clinic was negative for signs of infection. Pelvic exam was negative for any masses or prolapse. Unclear if her chronic back issues could be contributing, but she denies any fecal incontinence or saddle anesthesia. - Will refer to urology for further evaluation and management   Hyman Bible, MD PGY-2

## 2016-02-06 NOTE — Patient Instructions (Signed)
It was so nice to meet you!  Your urine did not show any signs of infection. Your exam looked normal. We will send you to the urologist for further evaluation.  -Dr. Brett Albino

## 2016-02-06 NOTE — Assessment & Plan Note (Signed)
Has been going on for a year, but she has never seen a doctor for this. She is able to pee normally first thing in the morning, but then has urgency and dribbling the rest of the day. Drinking normally. Sounds like she also has chronic stress incontinence as well. Oxycodone could be contributing, but Pt has been on this since the '90s. UA performed in clinic was negative for signs of infection. Pelvic exam was negative for any masses or prolapse. Unclear if her chronic back issues could be contributing, but she denies any fecal incontinence or saddle anesthesia. - Will refer to urology for further evaluation and management

## 2016-02-11 NOTE — Telephone Encounter (Signed)
Please let patient know that Colonoscopy referral placed and she can call breast center to schedule mammogram (no referral needed for screening mammogram).  Thanks!  Virginia Crews, MD, MPH PGY-3,  [facility] 02/11/2016 9:05 AM

## 2016-02-12 DIAGNOSIS — M545 Low back pain: Secondary | ICD-10-CM | POA: Diagnosis not present

## 2016-02-12 DIAGNOSIS — M4726 Other spondylosis with radiculopathy, lumbar region: Secondary | ICD-10-CM | POA: Diagnosis not present

## 2016-02-12 DIAGNOSIS — M4727 Other spondylosis with radiculopathy, lumbosacral region: Secondary | ICD-10-CM | POA: Diagnosis not present

## 2016-02-12 DIAGNOSIS — M5416 Radiculopathy, lumbar region: Secondary | ICD-10-CM | POA: Diagnosis not present

## 2016-02-12 NOTE — Telephone Encounter (Signed)
Informed patient of placed referral and her not needing referral for mammogram. Patient needs numbers to Breast Center,told her I would place it at the front desk for her to pick up. Nat Christen, CMA

## 2016-02-14 ENCOUNTER — Telehealth: Payer: Self-pay | Admitting: Family Medicine

## 2016-02-14 NOTE — Telephone Encounter (Signed)
-----   Message from Valerie Roys, [place] sent at 02/14/2016  9:48 AM EST ----- Hi,  I am going through a list of our patients who have A1cs in diabetic range but may or may not have it on their problem list. Patient last had an A1C of 6.5 on 06/02/15.  We are unsure if she is aware of this due to no documentation.  Feel free to utilize [facility] for diabetic education and health coaching once you have spoken with patient about this.

## 2016-02-14 NOTE — Telephone Encounter (Signed)
Please call patient and schedule a f/u appt for chronic disease management.  Thanks!  Virginia Crews, MD, MPH PGY-3,  [facility] 02/14/2016 1:31 PM

## 2016-02-15 DIAGNOSIS — M4727 Other spondylosis with radiculopathy, lumbosacral region: Secondary | ICD-10-CM | POA: Diagnosis not present

## 2016-02-15 DIAGNOSIS — M4726 Other spondylosis with radiculopathy, lumbar region: Secondary | ICD-10-CM | POA: Diagnosis not present

## 2016-02-15 DIAGNOSIS — M5416 Radiculopathy, lumbar region: Secondary | ICD-10-CM | POA: Diagnosis not present

## 2016-02-15 DIAGNOSIS — M545 Low back pain: Secondary | ICD-10-CM | POA: Diagnosis not present

## 2016-02-19 DIAGNOSIS — M4727 Other spondylosis with radiculopathy, lumbosacral region: Secondary | ICD-10-CM | POA: Diagnosis not present

## 2016-02-19 DIAGNOSIS — M5416 Radiculopathy, lumbar region: Secondary | ICD-10-CM | POA: Diagnosis not present

## 2016-02-19 DIAGNOSIS — M4726 Other spondylosis with radiculopathy, lumbar region: Secondary | ICD-10-CM | POA: Diagnosis not present

## 2016-02-19 DIAGNOSIS — M545 Low back pain: Secondary | ICD-10-CM | POA: Diagnosis not present

## 2016-02-20 ENCOUNTER — Telehealth: Payer: Self-pay | Admitting: *Deleted

## 2016-02-20 NOTE — Telephone Encounter (Signed)
Pt scheduled for an appt. Deseree Blount, CMA  

## 2016-02-20 NOTE — Telephone Encounter (Signed)
-----   Message from Virginia Crews, MD sent at 02/14/2016  1:27 PM EST ----- Please have patient schedule a follow-up appointment for chronic disease management. Thanks!  Virginia Crews, MD, MPH PGY-3,  [facility] 02/14/2016 1:27 PM

## 2016-02-22 ENCOUNTER — Other Ambulatory Visit: Payer: Self-pay | Admitting: Internal Medicine

## 2016-02-27 DIAGNOSIS — M5416 Radiculopathy, lumbar region: Secondary | ICD-10-CM | POA: Diagnosis not present

## 2016-02-27 DIAGNOSIS — M4726 Other spondylosis with radiculopathy, lumbar region: Secondary | ICD-10-CM | POA: Diagnosis not present

## 2016-02-27 DIAGNOSIS — M545 Low back pain: Secondary | ICD-10-CM | POA: Diagnosis not present

## 2016-02-27 DIAGNOSIS — M4727 Other spondylosis with radiculopathy, lumbosacral region: Secondary | ICD-10-CM | POA: Diagnosis not present

## 2016-03-04 NOTE — Progress Notes (Signed)
Subjective:   [name] is a 56 y.o. female with a history of HTN, PUD, hypothyroidism, T2DM, CKD3, obesity here for chronic disease management  HTN: - Medications: Toprol-XL 50 mg twice a day - Compliance: good - Checking BP at home: no - Denies any SOB, CP, vision changes, LE edema, medication SEs, or symptoms of hypotension  T2DM - Checking BG at home: n/a - Medications: None - Compliance: n/a - A1c 6.5 in 05/2015 - Diet: denies any carb intake, reports she only eats vegetables - Exercise: PT 3x/wk pending back surgery - eye exam: reports she recently had retinal exam - foot exam: declines - wants to ensure she has diabetes before testing - microalbumin: needs - denies symptoms of hypoglycemia, polyuria, polydipsia, numbness extremities, foot ulcers/trauma  Hypothyroidism - last TSH 0.805 in 05/2015 - taking Synthroid 75 mcg daily - compliance: good - denies any missed doses  Rash - reports red skin on inner thighs between upper thighs - worsened by stress urinary incontinence - has tried hand cream and has not helped - present intermittently for several years  Review of Systems:  Per HPI.   Social History: current smoker  Objective:  BP 110/80   Pulse 66   Temp 98.2 F (36.8 C) (Oral)   Ht 5\' 4"  (1.626 m)   Wt 175 lb (79.4 kg)   LMP 05/17/2009 (LMP Unknown)   BMI 30.04 kg/m   Gen:  56 y.o. female in NAD, somewhat agitated, will not sit still HEENT: NCAT, MMM, EOMI, PERRL, anicteric sclerae CV: RRR, no MRG Resp: Non-labored, CTAB, no wheezes noted Abd: Soft, NTND, BS present, no guarding or organomegaly Ext: WWP, no edema MSK: No obvious deformity, gait intact Skin: Erythematous skin of inner thighs Neuro: Alert and oriented, speech normal       Chemistry      Component Value Date/Time   NA 140 03/06/2016 1641   K 4.5 03/06/2016 1641   CL 102 03/06/2016 1641   CO2 32 (H) 03/06/2016 1641   BUN 9 03/06/2016 1641   CREATININE 1.22 (H) 03/06/2016  1641      Component Value Date/Time   CALCIUM 9.2 03/06/2016 1641   ALKPHOS 84 03/06/2016 1641   AST 12 03/06/2016 1641   ALT 11 03/06/2016 1641   BILITOT 0.4 03/06/2016 1641      Lab Results  Component Value Date   WBC 9.2 01/25/2016   HGB 16.9 (H) 01/25/2016   HCT 51.3 (H) 01/25/2016   MCV 96.2 01/25/2016   PLT 225 01/25/2016   Lab Results  Component Value Date   TSH 0.24 (L) 03/06/2016   Lab Results  Component Value Date   HGBA1C 5.8 03/06/2016   Assessment & Plan:     [name] is a 56 y.o. female here for   Essential hypertension Well controlled on Toprol Interesting that she is taking XL with BID dosing Will not adjust  Hypothyroidism Recheck TSH - shows low TSH Decrease Synthroid to 50 mcg daily Recheck TSH in 6 wks   Intertrigo Rash c/w intertrigo Nystatin cream Rx BID until resolution Advised keeping area clean and dry RTC precautions including signs of infection, discussed  Chronic kidney disease (CKD), stage III (moderate) Creatinine stable on repeat CMP  T2DM (type 2 diabetes mellitus) ([place]) Previously with A1c 6.5 - though does not seem diagnosis of DM was discussed with patient Repeat A1c 5.8 - discussed dietary changes with patient No need for medications, foot exam, etc  Obesity (BMI 30-39.9) Discussed diet and exercise   Virginia Crews, MD MPH PGY-3,  [facility] 03/07/2016  10:29 AM

## 2016-03-06 ENCOUNTER — Ambulatory Visit (INDEPENDENT_AMBULATORY_CARE_PROVIDER_SITE_OTHER): Payer: No Typology Code available for payment source | Admitting: Family Medicine

## 2016-03-06 VITALS — BP 110/80 | HR 66 | Temp 98.2°F | Ht 64.0 in | Wt 175.0 lb

## 2016-03-06 DIAGNOSIS — L304 Erythema intertrigo: Secondary | ICD-10-CM | POA: Diagnosis not present

## 2016-03-06 DIAGNOSIS — N183 Chronic kidney disease, stage 3 unspecified: Secondary | ICD-10-CM

## 2016-03-06 DIAGNOSIS — E669 Obesity, unspecified: Secondary | ICD-10-CM | POA: Diagnosis not present

## 2016-03-06 DIAGNOSIS — I10 Essential (primary) hypertension: Secondary | ICD-10-CM

## 2016-03-06 DIAGNOSIS — E119 Type 2 diabetes mellitus without complications: Secondary | ICD-10-CM | POA: Insufficient documentation

## 2016-03-06 DIAGNOSIS — E039 Hypothyroidism, unspecified: Secondary | ICD-10-CM

## 2016-03-06 LAB — COMPLETE METABOLIC PANEL WITH GFR
ALT: 11 U/L (ref 6–29)
AST: 12 U/L (ref 10–35)
Albumin: 3.7 g/dL (ref 3.6–5.1)
Alkaline Phosphatase: 84 U/L (ref 33–130)
BUN: 9 mg/dL (ref 7–25)
CALCIUM: 9.2 mg/dL (ref 8.6–10.4)
CHLORIDE: 102 mmol/L (ref 98–110)
CO2: 32 mmol/L — AB (ref 20–31)
Creat: 1.22 mg/dL — ABNORMAL HIGH (ref 0.50–1.05)
GFR, EST AFRICAN AMERICAN: 58 mL/min — AB (ref >=60)
GFR, EST NON AFRICAN AMERICAN: 50 mL/min — AB (ref >=60)
Glucose, Bld: 97 mg/dL (ref 65–99)
POTASSIUM: 4.5 mmol/L (ref 3.5–5.3)
Sodium: 140 mmol/L (ref 135–146)
Total Bilirubin: 0.4 mg/dL (ref 0.2–1.2)
Total Protein: 5.7 g/dL — ABNORMAL LOW (ref 6.1–8.1)

## 2016-03-06 LAB — POCT GLYCOSYLATED HEMOGLOBIN (HGB A1C): HEMOGLOBIN A1C: 5.8

## 2016-03-06 MED ORDER — NYSTATIN 100000 UNIT/GM EX CREA: 1.0000 "application " | TOPICAL_CREAM | Freq: Two times a day (BID) | CUTANEOUS | 0 refills | Status: DC

## 2016-03-06 NOTE — Patient Instructions (Addendum)
Nice to see you again today. Your blood pressure is well controlled. We are getting some labs today and someone will call you or send you a letter with the results when they're available. I will let you know if we need to start a medicine for diabetes. If we do start a medicine, allergies see back in one month. Otherwise I'll see back in 3 months.  Take care,  Dr. B  Diabetes Mellitus and Food It is important for you to manage your blood sugar (glucose) level. Your blood glucose level can be greatly affected by what you eat. Eating healthier foods in the appropriate amounts throughout the day at about the same time each day will help you control your blood glucose level. It can also help slow or prevent worsening of your diabetes mellitus. Healthy eating may even help you improve the level of your blood pressure and reach or maintain a healthy weight. General recommendations for healthful eating and cooking habits include:  Eating meals and snacks regularly. Avoid going long periods of time without eating to lose weight.  Eating a diet that consists mainly of plant-based foods, such as fruits, vegetables, nuts, legumes, and whole grains.  Using low-heat cooking methods, such as baking, instead of high-heat cooking methods, such as deep frying. Work with your dietitian to make sure you understand how to use the Nutrition Facts information on food labels. How can food affect me? Carbohydrates  Carbohydrates affect your blood glucose level more than any other type of food. Your dietitian will help you determine how many carbohydrates to eat at each meal and teach you how to count carbohydrates. Counting carbohydrates is important to keep your blood glucose at a healthy level, especially if you are using insulin or taking certain medicines for diabetes mellitus. Alcohol  Alcohol can cause sudden decreases in blood glucose (hypoglycemia), especially if you use insulin or take certain medicines for  diabetes mellitus. Hypoglycemia can be a life-threatening condition. Symptoms of hypoglycemia (sleepiness, dizziness, and disorientation) are similar to symptoms of having too much alcohol. If your health care provider has given you approval to drink alcohol, do so in moderation and use the following guidelines:  Women should not have more than one drink per day, and men should not have more than two drinks per day. One drink is equal to:  12 oz of beer.  5 oz of wine.  1 oz of hard liquor.  Do not drink on an empty stomach.  Keep yourself hydrated. Have water, diet soda, or unsweetened iced tea.  Regular soda, juice, and other mixers might contain a lot of carbohydrates and should be counted. What foods are not recommended? As you make food choices, it is important to remember that all foods are not the same. Some foods have fewer nutrients per serving than other foods, even though they might have the same number of calories or carbohydrates. It is difficult to get your body what it needs when you eat foods with fewer nutrients. Examples of foods that you should avoid that are high in calories and carbohydrates but low in nutrients include:  Trans fats (most processed foods list trans fats on the Nutrition Facts label).  Regular soda.  Juice.  Candy.  Sweets, such as cake, pie, doughnuts, and cookies.  Fried foods. What foods can I eat? Eat nutrient-rich foods, which will nourish your body and keep you healthy. The food you should eat also will depend on several factors, including:  The calories you  need.  The medicines you take.  Your weight.  Your blood glucose level.  Your blood pressure level.  Your cholesterol level. You should eat a variety of foods, including:  Protein.  Lean cuts of meat.  Proteins low in saturated fats, such as fish, egg whites, and beans. Avoid processed meats.  Fruits and vegetables.  Fruits and vegetables that may help control blood  glucose levels, such as apples, mangoes, and yams.  Dairy products.  Choose fat-free or low-fat dairy products, such as milk, yogurt, and cheese.  Grains, bread, pasta, and rice.  Choose whole grain products, such as multigrain bread, whole oats, and brown rice. These foods may help control blood pressure.  Fats.  Foods containing healthful fats, such as nuts, avocado, olive oil, canola oil, and fish. Does everyone with diabetes mellitus have the same meal plan? Because every person with diabetes mellitus is different, there is not one meal plan that works for everyone. It is very important that you meet with a dietitian who will help you create a meal plan that is just right for you. This information is not intended to replace advice given to you by your health care provider. Make sure you discuss any questions you have with your health care provider. Document Released: 10/04/2004 Document Revised: 06/15/2015 Document Reviewed: 12/04/2012 Elsevier Interactive Patient Education  2017 [employer].

## 2016-03-07 ENCOUNTER — Encounter (HOSPITAL_COMMUNITY): Payer: Self-pay | Admitting: Psychiatry

## 2016-03-07 ENCOUNTER — Ambulatory Visit (INDEPENDENT_AMBULATORY_CARE_PROVIDER_SITE_OTHER): Payer: Medicare Other | Admitting: Psychiatry

## 2016-03-07 ENCOUNTER — Telehealth: Payer: Self-pay | Admitting: Family Medicine

## 2016-03-07 DIAGNOSIS — Z833 Family history of diabetes mellitus: Secondary | ICD-10-CM | POA: Diagnosis not present

## 2016-03-07 DIAGNOSIS — Z9049 Acquired absence of other specified parts of digestive tract: Secondary | ICD-10-CM

## 2016-03-07 DIAGNOSIS — G4701 Insomnia due to medical condition: Secondary | ICD-10-CM | POA: Diagnosis not present

## 2016-03-07 DIAGNOSIS — Z9889 Other specified postprocedural states: Secondary | ICD-10-CM | POA: Diagnosis not present

## 2016-03-07 DIAGNOSIS — Z888 Allergy status to other drugs, medicaments and biological substances status: Secondary | ICD-10-CM

## 2016-03-07 DIAGNOSIS — F411 Generalized anxiety disorder: Secondary | ICD-10-CM | POA: Diagnosis not present

## 2016-03-07 DIAGNOSIS — Z79891 Long term (current) use of opiate analgesic: Secondary | ICD-10-CM

## 2016-03-07 DIAGNOSIS — Z7982 Long term (current) use of aspirin: Secondary | ICD-10-CM

## 2016-03-07 DIAGNOSIS — Z808 Family history of malignant neoplasm of other organs or systems: Secondary | ICD-10-CM | POA: Diagnosis not present

## 2016-03-07 DIAGNOSIS — Z8249 Family history of ischemic heart disease and other diseases of the circulatory system: Secondary | ICD-10-CM

## 2016-03-07 DIAGNOSIS — F17210 Nicotine dependence, cigarettes, uncomplicated: Secondary | ICD-10-CM | POA: Diagnosis not present

## 2016-03-07 DIAGNOSIS — Z79899 Other long term (current) drug therapy: Secondary | ICD-10-CM

## 2016-03-07 DIAGNOSIS — Z9851 Tubal ligation status: Secondary | ICD-10-CM | POA: Diagnosis not present

## 2016-03-07 DIAGNOSIS — Z91040 Latex allergy status: Secondary | ICD-10-CM

## 2016-03-07 LAB — TSH: TSH: 0.24 mIU/L — ABNORMAL LOW

## 2016-03-07 MED ORDER — LEVOTHYROXINE SODIUM 50 MCG PO TABS: 75.0000 ug | ORAL_TABLET | Freq: Every day | ORAL | 3 refills | Status: DC

## 2016-03-07 MED ORDER — TRAZODONE HCL 50 MG PO TABS: 50.0000 mg | ORAL_TABLET | Freq: Every day | ORAL | 2 refills | Status: DC

## 2016-03-07 MED ORDER — LORAZEPAM 1 MG PO TABS
ORAL_TABLET | ORAL | 2 refills | Status: DC
Start: 2016-03-07 — End: 2016-03-15

## 2016-03-07 MED ORDER — LEVOTHYROXINE SODIUM 50 MCG PO TABS: 50.0000 ug | ORAL_TABLET | Freq: Every day | ORAL | 3 refills | Status: DC

## 2016-03-07 NOTE — Assessment & Plan Note (Signed)
Rash c/w intertrigo Nystatin cream Rx BID until resolution Advised keeping area clean and dry RTC precautions including signs of infection, discussed

## 2016-03-07 NOTE — Assessment & Plan Note (Signed)
Creatinine stable on repeat CMP

## 2016-03-07 NOTE — Assessment & Plan Note (Signed)
Well controlled on Toprol Interesting that she is taking XL with BID dosing Will not adjust

## 2016-03-07 NOTE — Assessment & Plan Note (Signed)
Recheck TSH - shows low TSH Decrease Synthroid to 50 mcg daily Recheck TSH in 6 wks

## 2016-03-07 NOTE — Assessment & Plan Note (Signed)
Discussed diet and exercise 
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
## 2016-03-07 NOTE — Assessment & Plan Note (Addendum)
Previously with A1c 6.5 - though does not seem diagnosis of DM was discussed with patient Repeat A1c 5.8 - discussed dietary changes with patient No need for medications, foot exam, etc

## 2016-03-07 NOTE — Telephone Encounter (Signed)
Attempted to call patient back.  Her TSH (thyroid function) is down, which means that synthroid (levothyroxin) dose is too high.  Prescription for 50 g daily sent to her pharmacy. This is what she should switch to taking.    We will recheck her thyroid function in 6 weeks to determine if this is the correct dose for her.  Also if patient calls back, please inform her that her kidney function is stable. Her liver is functioning normal. Her electrolytes are normal. She does not have diabetes, but does have prediabetes. She still works on the dietary changes that we discussed. She does not need medication for this at this point.  Virginia Crews, MD, MPH PGY-3,  [facility] 03/07/2016 5:11 PM

## 2016-03-07 NOTE — Telephone Encounter (Signed)
Pt states she has 3 different Rx's for levothyroxine at [facility]. Pt wants the one were she only takes one 54mcg tablet a day. Pt wants the correct one called into Pharmacy and for PCP to call her to clarify. ep

## 2016-03-08 NOTE — Telephone Encounter (Signed)
Attempted to call patient again.  No answer.  Left VM asking her to call back to clinic.  Virginia Crews, MD, MPH PGY-3,  [facility] 03/08/2016 3:00 PM

## 2016-03-12 ENCOUNTER — Telehealth: Payer: Self-pay | Admitting: Student

## 2016-03-12 ENCOUNTER — Ambulatory Visit: Payer: Self-pay | Admitting: Family Medicine

## 2016-03-12 NOTE — Telephone Encounter (Signed)
Patient called because she is concerned her synthroid dose is causing anxiety. Her synthroid was decreased from 75 to 50. She was asked to take her prescribed anti anxiety medications as needed and to call the office in the AM if she has further concerns

## 2016-03-13 ENCOUNTER — Telehealth (HOSPITAL_COMMUNITY): Payer: Self-pay

## 2016-03-13 DIAGNOSIS — M4727 Other spondylosis with radiculopathy, lumbosacral region: Secondary | ICD-10-CM | POA: Diagnosis not present

## 2016-03-13 DIAGNOSIS — M545 Low back pain: Secondary | ICD-10-CM | POA: Diagnosis not present

## 2016-03-13 DIAGNOSIS — M4726 Other spondylosis with radiculopathy, lumbar region: Secondary | ICD-10-CM | POA: Diagnosis not present

## 2016-03-13 DIAGNOSIS — M5416 Radiculopathy, lumbar region: Secondary | ICD-10-CM | POA: Diagnosis not present

## 2016-03-13 NOTE — Telephone Encounter (Signed)
Patient called to ask if the medication change made by her PCP could cause an increase in her anxiety. Patient saw PCP on 2/14 and they reduced her Synthroid from 75 mcg daily to 50 mcg daily.She said she was finally getting her anxiety under control, but now it seems to be back. Please review and advise, thank you

## 2016-03-14 NOTE — Telephone Encounter (Signed)
Yes it is possible for anxiety symptoms to increase with thyroid issues

## 2016-03-15 ENCOUNTER — Telehealth: Payer: Self-pay | Admitting: *Deleted

## 2016-03-15 ENCOUNTER — Other Ambulatory Visit (HOSPITAL_COMMUNITY): Payer: Self-pay

## 2016-03-15 DIAGNOSIS — F411 Generalized anxiety disorder: Secondary | ICD-10-CM

## 2016-03-15 MED ORDER — LORAZEPAM 1 MG PO TABS: ORAL_TABLET | ORAL | 1 refills | Status: DC

## 2016-03-15 NOTE — Progress Notes (Signed)
Per Dr. Doyne Keel, send in a prescription for Ativan 1 mg TID as needed. I called pharmacy and discontinued the old prescription and gave the new one. Patient was called and she is in agreement with this plan.

## 2016-03-15 NOTE — Telephone Encounter (Signed)
Pt calling in wanting to talk with dr about thyroid and medication problems. Please advise and call pt. [name] Kennon Holter, CMA

## 2016-03-15 NOTE — Telephone Encounter (Signed)
I called patient and left a voicemail letting her know what Dr. Doyne Keel said. I told patient to call me back with any questions

## 2016-03-18 NOTE — Telephone Encounter (Signed)
Attempted to call patient back regarding her questions about her thyroid. No answer. Left voicemail asking patient to call back to the clinic if she starts questions.  If patient returns the call, she can be advised that her levothyroxine dose was decreased because her TSH was low meaning that her Synthroid dose was too high.  She should be taking the 50 g daily dosing. We'll plan to recheck her thyroid levels in 6 weeks and adjust further as necessary. If she is having new symptoms or concerns, she can make a follow-up appointment.  Virginia Crews, MD, MPH PGY-3,  [facility] 03/18/2016 8:52 AM

## 2016-03-19 DIAGNOSIS — R3912 Poor urinary stream: Secondary | ICD-10-CM | POA: Diagnosis not present

## 2016-03-19 DIAGNOSIS — N393 Stress incontinence (female) (male): Secondary | ICD-10-CM | POA: Diagnosis not present

## 2016-03-20 DIAGNOSIS — M4316 Spondylolisthesis, lumbar region: Secondary | ICD-10-CM | POA: Diagnosis not present

## 2016-03-26 ENCOUNTER — Ambulatory Visit: Payer: Self-pay | Admitting: Family Medicine

## 2016-04-08 ENCOUNTER — Encounter: Payer: Self-pay | Admitting: Family Medicine

## 2016-04-16 ENCOUNTER — Ambulatory Visit (INDEPENDENT_AMBULATORY_CARE_PROVIDER_SITE_OTHER): Payer: No Typology Code available for payment source | Admitting: Family Medicine

## 2016-04-16 ENCOUNTER — Encounter: Payer: Self-pay | Admitting: Family Medicine

## 2016-04-16 VITALS — BP 118/80 | HR 67 | Temp 98.7°F | Ht 64.0 in | Wt 170.0 lb

## 2016-04-16 DIAGNOSIS — E039 Hypothyroidism, unspecified: Secondary | ICD-10-CM

## 2016-04-16 NOTE — Progress Notes (Signed)
   Subjective:   [name] is a 56 y.o. female with a history of HTN, T2 DM, hypothyroidism, ankylosing spondylitis, rheumatoid arthritis, insomnia, anxiety, tobacco abuse here for hypothyroidism follow up  Hypothyroidism  - dose of Synthroid decreased from 75 to 50 mcg daily - feeling more fatigued and drier skin - continues to have constipation at baseline - Compliance: Good with no missed doses  Urinary incontinence/OAB - seeing Urology for workup - had to reschedule appt for today  Review of Systems:  Per HPI.   Social History: Current smoker  Objective:  BP 118/80   Pulse 67   Temp 98.7 F (37.1 C) (Oral)   Ht 5\' 4"  (1.626 m)   Wt 170 lb (77.1 kg)   LMP 05/17/2009 (LMP Unknown)   SpO2 98%   BMI 29.18 kg/m   Gen:  56 y.o. female in NAD HEENT: NCAT, MMM, anicteric sclerae.  Neck: Supple, no LAD, no thyromegaly CV: RRR, no MRG Resp: Non-labored, CTAB, no wheezes noted Abd: Soft, NTND, BS present, no guarding or organomegaly Ext: WWP, no edema MSK:No obvious deformities, gait intact  Neuro: Alert and oriented, speech normal     Lab Results  Component Value Date   TSH 0.24 (L) 03/06/2016    Assessment & Plan:     [name] is a 56 y.o. female here for   Hypothyroidism Patient symptomatic on her decreased dose of Synthroid Recheck TSH and adjust Synthroid dose as indicated    Virginia Crews, MD MPH PGY-3,  [facility] 04/16/2016  4:39 PM

## 2016-04-16 NOTE — Assessment & Plan Note (Signed)
Patient symptomatic on her decreased dose of Synthroid Recheck TSH and adjust Synthroid dose as indicated

## 2016-04-16 NOTE — Patient Instructions (Signed)
Nice to see you again today. We will recheck your thyroid labs. I will call you or send you a letter with this result when it is available. We will adjust your Synthroid dose as needed based on this lab result.  Take care, Dr. Jacinto Reap

## 2016-04-17 LAB — TSH: TSH: 1.46 u[IU]/mL (ref 0.450–4.500)

## 2016-04-18 ENCOUNTER — Telehealth: Payer: Self-pay | Admitting: *Deleted

## 2016-04-18 NOTE — Telephone Encounter (Signed)
-----   Message from Virginia Crews, MD sent at 04/17/2016  8:58 AM EDT ----- Please let patient know that TSH is normal and she should stay on same Synthroid dose (50 mcg daily).  Thanks!  Virginia Crews, MD, MPH PGY-3,  [facility] 04/17/2016 8:58 AM

## 2016-04-18 NOTE — Telephone Encounter (Signed)
LM for patient with results and dosing instructions ok per DPR. Fallen Crisostomo,CMA

## 2016-04-22 ENCOUNTER — Other Ambulatory Visit: Payer: Self-pay | Admitting: *Deleted

## 2016-04-22 DIAGNOSIS — K219 Gastro-esophageal reflux disease without esophagitis: Secondary | ICD-10-CM

## 2016-04-22 MED ORDER — LANSOPRAZOLE 30 MG PO CPDR: DELAYED_RELEASE_CAPSULE | ORAL | 0 refills | Status: DC

## 2016-04-25 ENCOUNTER — Ambulatory Visit (INDEPENDENT_AMBULATORY_CARE_PROVIDER_SITE_OTHER): Payer: No Typology Code available for payment source | Admitting: Psychiatry

## 2016-04-25 ENCOUNTER — Encounter (HOSPITAL_COMMUNITY): Payer: Self-pay | Admitting: Psychiatry

## 2016-04-25 DIAGNOSIS — Z7982 Long term (current) use of aspirin: Secondary | ICD-10-CM

## 2016-04-25 DIAGNOSIS — Z79899 Other long term (current) drug therapy: Secondary | ICD-10-CM

## 2016-04-25 DIAGNOSIS — Z79891 Long term (current) use of opiate analgesic: Secondary | ICD-10-CM | POA: Diagnosis not present

## 2016-04-25 DIAGNOSIS — F17210 Nicotine dependence, cigarettes, uncomplicated: Secondary | ICD-10-CM

## 2016-04-25 DIAGNOSIS — F411 Generalized anxiety disorder: Secondary | ICD-10-CM

## 2016-04-25 DIAGNOSIS — G4701 Insomnia due to medical condition: Secondary | ICD-10-CM

## 2016-04-25 MED ORDER — TRAZODONE HCL 50 MG PO TABS: 50.0000 mg | ORAL_TABLET | Freq: Every day | ORAL | 2 refills | Status: DC

## 2016-04-25 MED ORDER — LORAZEPAM 1 MG PO TABS: ORAL_TABLET | ORAL | 1 refills | Status: DC
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
## 2016-05-07 ENCOUNTER — Other Ambulatory Visit: Payer: Self-pay | Admitting: *Deleted

## 2016-05-07 MED ORDER — ATORVASTATIN CALCIUM 20 MG PO TABS: 20.0000 mg | ORAL_TABLET | Freq: Every day | ORAL | 3 refills | Status: DC

## 2016-05-07 MED ORDER — LEVOTHYROXINE SODIUM 50 MCG PO TABS: 50.0000 ug | ORAL_TABLET | Freq: Every day | ORAL | 3 refills | Status: DC

## 2016-05-15 ENCOUNTER — Other Ambulatory Visit: Payer: Self-pay | Admitting: *Deleted

## 2016-05-15 MED ORDER — LEVOTHYROXINE SODIUM 50 MCG PO TABS: 50.0000 ug | ORAL_TABLET | Freq: Every day | ORAL | 0 refills | Status: DC

## 2016-05-15 NOTE — Telephone Encounter (Signed)
Refill request for 90 day supply.  Amy Gothard L, RN  

## 2016-05-27 ENCOUNTER — Telehealth (HOSPITAL_COMMUNITY): Payer: Self-pay

## 2016-05-27 NOTE — Telephone Encounter (Signed)
Medication management - Telephone message left for patient after she left one requesting refills of Trazodone and Lorazepam.  Informed pt. should still have refills as a new order for Trazodone was sent to her [facility] on [address] 04/25/16 + 2 refills and that patient was given a printed prescription for Lorazepam + 1 refill that same date.  Patient to call back once checks with pharmacy if still has problems getting refills.

## 2016-06-04 ENCOUNTER — Ambulatory Visit: Payer: No Typology Code available for payment source | Admitting: Physician Assistant

## 2016-06-11 ENCOUNTER — Ambulatory Visit: Payer: No Typology Code available for payment source | Admitting: Physician Assistant

## 2016-06-13 ENCOUNTER — Ambulatory Visit (HOSPITAL_COMMUNITY): Payer: Self-pay | Admitting: Psychiatry

## 2016-06-16 ENCOUNTER — Other Ambulatory Visit: Payer: Self-pay | Admitting: Family Medicine

## 2016-06-16 DIAGNOSIS — K219 Gastro-esophageal reflux disease without esophagitis: Secondary | ICD-10-CM

## 2016-06-28 ENCOUNTER — Ambulatory Visit: Payer: No Typology Code available for payment source | Admitting: Cardiology

## 2016-07-02 ENCOUNTER — Ambulatory Visit: Payer: Self-pay | Admitting: Family Medicine

## 2016-07-03 ENCOUNTER — Telehealth: Payer: Self-pay | Admitting: Radiology
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
## 2016-07-16 ENCOUNTER — Ambulatory Visit: Payer: No Typology Code available for payment source | Admitting: Family Medicine

## 2016-07-19 ENCOUNTER — Ambulatory Visit: Payer: No Typology Code available for payment source | Admitting: Family Medicine

## 2016-07-25 ENCOUNTER — Other Ambulatory Visit: Payer: Self-pay | Admitting: *Deleted

## 2016-07-25 MED ORDER — GABAPENTIN 300 MG PO CAPS: 300.0000 mg | ORAL_CAPSULE | Freq: Every day | ORAL | 3 refills | Status: DC

## 2016-08-02 ENCOUNTER — Encounter: Payer: Self-pay | Admitting: Internal Medicine

## 2016-08-02 ENCOUNTER — Ambulatory Visit (INDEPENDENT_AMBULATORY_CARE_PROVIDER_SITE_OTHER): Payer: Medicare Other | Admitting: Internal Medicine

## 2016-08-02 VITALS — BP 130/74 | HR 74 | Temp 97.4°F | Ht 64.0 in | Wt 156.0 lb

## 2016-08-02 DIAGNOSIS — M5416 Radiculopathy, lumbar region: Secondary | ICD-10-CM

## 2016-08-02 DIAGNOSIS — L304 Erythema intertrigo: Secondary | ICD-10-CM

## 2016-08-02 DIAGNOSIS — M545 Low back pain: Secondary | ICD-10-CM

## 2016-08-02 DIAGNOSIS — G8929 Other chronic pain: Secondary | ICD-10-CM

## 2016-08-02 MED ORDER — NYSTATIN 100000 UNIT/GM EX CREA: 1.0000 "application " | TOPICAL_CREAM | Freq: Two times a day (BID) | CUTANEOUS | 2 refills | Status: DC

## 2016-08-02 MED ORDER — BACLOFEN 10 MG PO TABS: 10.0000 mg | ORAL_TABLET | Freq: Two times a day (BID) | ORAL | 3 refills | Status: DC | PRN

## 2016-08-02 NOTE — Progress Notes (Signed)
Patient was originally scheduled to see Dr. Ola Spurr. Dr. was running way behind so I saw her. Main issues were need for referral to new pain clinic. She was dismissed from her current pain medicine clinic about 2 months ago and she's having significant back pain. She tells me she needs to have another back surgery by Dr. Ellene Route and neurosurgeon in the near future.  She also needs refills on her baclofen and her nystatin. The nystatin mistreating a rash she's had in the groin area. Has significantly improved but not totally resolved.  The third issue is some elevated blood sugars. She was supposed to come back and talk with her new PCP about this.  I will not charge her for today's visit but I will refill her baclofen and nystatin. I urged her to make another appointment with her new PCP, Dr. Ola Spurr. I apologize for the delay today. I will also go ahead and place her pain medicine clinic referral.

## 2016-08-06 ENCOUNTER — Telehealth (HOSPITAL_COMMUNITY): Payer: Self-pay

## 2016-08-06 ENCOUNTER — Encounter (HOSPITAL_COMMUNITY): Payer: Self-pay | Admitting: Emergency Medicine

## 2016-08-06 ENCOUNTER — Emergency Department (HOSPITAL_COMMUNITY)
Admission: EM | Admit: 2016-08-06 | Discharge: 2016-08-06 | Disposition: A | Discharge status: Home / Self Care | Payer: No Typology Code available for payment source | Attending: Emergency Medicine | Admitting: Emergency Medicine

## 2016-08-06 DIAGNOSIS — F17210 Nicotine dependence, cigarettes, uncomplicated: Secondary | ICD-10-CM | POA: Insufficient documentation

## 2016-08-06 DIAGNOSIS — Z91040 Latex allergy status: Secondary | ICD-10-CM | POA: Diagnosis not present

## 2016-08-06 DIAGNOSIS — E039 Hypothyroidism, unspecified: Secondary | ICD-10-CM | POA: Insufficient documentation

## 2016-08-06 DIAGNOSIS — I129 Hypertensive chronic kidney disease with stage 1 through stage 4 chronic kidney disease, or unspecified chronic kidney disease: Secondary | ICD-10-CM | POA: Insufficient documentation

## 2016-08-06 DIAGNOSIS — Y9389 Activity, other specified: Secondary | ICD-10-CM | POA: Insufficient documentation

## 2016-08-06 DIAGNOSIS — E119 Type 2 diabetes mellitus without complications: Secondary | ICD-10-CM | POA: Diagnosis not present

## 2016-08-06 DIAGNOSIS — S3982XA Other specified injuries of lower back, initial encounter: Secondary | ICD-10-CM | POA: Insufficient documentation

## 2016-08-06 DIAGNOSIS — G8929 Other chronic pain: Secondary | ICD-10-CM | POA: Insufficient documentation

## 2016-08-06 DIAGNOSIS — Y999 Unspecified external cause status: Secondary | ICD-10-CM | POA: Insufficient documentation

## 2016-08-06 DIAGNOSIS — M791 Myalgia: Secondary | ICD-10-CM | POA: Diagnosis not present

## 2016-08-06 DIAGNOSIS — Y929 Unspecified place or not applicable: Secondary | ICD-10-CM | POA: Diagnosis not present

## 2016-08-06 DIAGNOSIS — Z79899 Other long term (current) drug therapy: Secondary | ICD-10-CM | POA: Insufficient documentation

## 2016-08-06 DIAGNOSIS — N183 Chronic kidney disease, stage 3 (moderate): Secondary | ICD-10-CM | POA: Insufficient documentation

## 2016-08-06 DIAGNOSIS — S3992XA Unspecified injury of lower back, initial encounter: Secondary | ICD-10-CM | POA: Diagnosis present

## 2016-08-06 MED ORDER — METHOCARBAMOL 500 MG PO TABS
500.0000 mg | ORAL_TABLET | Freq: Once | ORAL | Status: AC
  Administered 2016-08-06: 500 mg via ORAL
  Filled 2016-08-06: qty 1

## 2016-08-06 MED ORDER — METHOCARBAMOL 500 MG PO TABS: 500.0000 mg | ORAL_TABLET | Freq: Two times a day (BID) | ORAL | 0 refills | Status: DC

## 2016-08-06 MED ORDER — NAPROXEN 500 MG PO TABS: 500.0000 mg | ORAL_TABLET | Freq: Two times a day (BID) | ORAL | 0 refills | Status: DC

## 2016-08-06 MED ORDER — IBUPROFEN 400 MG PO TABS
600.0000 mg | ORAL_TABLET | Freq: Once | ORAL | Status: AC
  Administered 2016-08-06: 600 mg via ORAL
  Filled 2016-08-06: qty 1

## 2016-08-06 MED ORDER — OXYCODONE-ACETAMINOPHEN 5-325 MG PO TABS
1.0000 | ORAL_TABLET | Freq: Once | ORAL | Status: AC
  Administered 2016-08-06: 1 via ORAL
  Filled 2016-08-06: qty 1
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
## 2016-08-06 NOTE — Telephone Encounter (Signed)
Medication refill request - Message received from patient with request for a new Lorazepam order. Patient is scheduled with Dr. Doyne Keel 08/08/16 after no show 5/24. Last order 04/25/16 for 90 day supply.  Telephone call with patient to inform request would be sent to Dr. Lovena Le as Dr. Doyne Keel does not return until 719/18 when patient is scheduled to see her at 4:15pm that date

## 2016-08-08 ENCOUNTER — Encounter (HOSPITAL_COMMUNITY): Payer: Self-pay | Admitting: Psychiatry

## 2016-08-08 ENCOUNTER — Ambulatory Visit (INDEPENDENT_AMBULATORY_CARE_PROVIDER_SITE_OTHER): Payer: No Typology Code available for payment source | Admitting: Psychiatry

## 2016-08-08 DIAGNOSIS — F17210 Nicotine dependence, cigarettes, uncomplicated: Secondary | ICD-10-CM | POA: Diagnosis not present

## 2016-08-08 DIAGNOSIS — M2550 Pain in unspecified joint: Secondary | ICD-10-CM | POA: Diagnosis not present

## 2016-08-08 DIAGNOSIS — M549 Dorsalgia, unspecified: Secondary | ICD-10-CM | POA: Diagnosis not present

## 2016-08-08 DIAGNOSIS — M791 Myalgia: Secondary | ICD-10-CM

## 2016-08-08 DIAGNOSIS — Z79899 Other long term (current) drug therapy: Secondary | ICD-10-CM | POA: Diagnosis not present

## 2016-08-08 DIAGNOSIS — F411 Generalized anxiety disorder: Secondary | ICD-10-CM | POA: Diagnosis not present

## 2016-08-08 DIAGNOSIS — R109 Unspecified abdominal pain: Secondary | ICD-10-CM

## 2016-08-08 DIAGNOSIS — M542 Cervicalgia: Secondary | ICD-10-CM

## 2016-08-08 DIAGNOSIS — G4701 Insomnia due to medical condition: Secondary | ICD-10-CM | POA: Diagnosis not present

## 2016-08-08 DIAGNOSIS — R112 Nausea with vomiting, unspecified: Secondary | ICD-10-CM | POA: Diagnosis not present

## 2016-08-08 MED ORDER — LORAZEPAM 1 MG PO TABS: ORAL_TABLET | ORAL | 1 refills | Status: DC

## 2016-08-08 MED ORDER — TRAZODONE HCL 100 MG PO TABS: 100.0000 mg | ORAL_TABLET | Freq: Every evening | ORAL | 1 refills | Status: DC | PRN
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
## 2016-08-13 ENCOUNTER — Telehealth (HOSPITAL_COMMUNITY): Payer: Self-pay

## 2016-08-13 NOTE — Telephone Encounter (Signed)
Patient called and said that the Trazodone 100 mg was too much. Patient states that her throat closed up making it hard to breathe and then she suffered a panic attack because she could not breathe. She went back to 50 mg and just wanted you to know

## 2016-08-22 DIAGNOSIS — M4316 Spondylolisthesis, lumbar region: Secondary | ICD-10-CM | POA: Diagnosis not present

## 2016-08-23 ENCOUNTER — Encounter: Payer: Self-pay | Admitting: Internal Medicine

## 2016-08-23 ENCOUNTER — Ambulatory Visit (INDEPENDENT_AMBULATORY_CARE_PROVIDER_SITE_OTHER): Payer: No Typology Code available for payment source | Admitting: Internal Medicine

## 2016-08-23 VITALS — BP 118/82 | HR 71 | Temp 98.6°F | Ht 64.0 in | Wt 162.0 lb

## 2016-08-23 DIAGNOSIS — G8929 Other chronic pain: Secondary | ICD-10-CM

## 2016-08-23 DIAGNOSIS — M549 Dorsalgia, unspecified: Secondary | ICD-10-CM

## 2016-08-23 DIAGNOSIS — M545 Low back pain: Secondary | ICD-10-CM | POA: Diagnosis not present

## 2016-08-23 DIAGNOSIS — K219 Gastro-esophageal reflux disease without esophagitis: Secondary | ICD-10-CM

## 2016-08-23 DIAGNOSIS — E039 Hypothyroidism, unspecified: Secondary | ICD-10-CM | POA: Diagnosis not present

## 2016-08-23 DIAGNOSIS — E119 Type 2 diabetes mellitus without complications: Secondary | ICD-10-CM | POA: Diagnosis not present

## 2016-08-23 LAB — POCT GLYCOSYLATED HEMOGLOBIN (HGB A1C): Hemoglobin A1C: 5.9

## 2016-08-23 MED ORDER — LANSOPRAZOLE 30 MG PO CPDR: DELAYED_RELEASE_CAPSULE | ORAL | 3 refills | Status: DC

## 2016-08-23 NOTE — Assessment & Plan Note (Signed)
A1C 5.9 today, 5.8 at last appt. No need to begin medication or to continue rechecking A1C regularly for now.

## 2016-08-23 NOTE — Progress Notes (Signed)
   Subjective:   Patient: [name]       Birthdate: 1960-05-07       MRN: [id number]      HPI  [name] is a 56 y.o. female presenting for prediabetes, hypothyroidism, and pain clinic referral.   Prediabetes Patient told in past she has prediabetes. Has never been on medication before.   Hypothyroidism Patient with chronic history of this. Was on Synthroid 50mcg until about 4 months ago, when dose was decreased to 75mcg. Would like her TSH checked today to make sure she is on the correct dose.   Chronic pain Patient with history of chronic pain which she says is secondary to multiple back and leg surgeries. She is followed by [facility], and is supposed to be having another spinal surgery soon. She was previously seen at [facility] by Drs. Andree Elk and [place], but says she was dismissed from this clinic after her UDS came back positive for other drugs. She says she was taking Nyquil and this is why her UDS had other substances in it. She has been without any oxycodone for over a month. She is not asking for refill today, but would like to referral to pain clinic affiliated with [facility]. She says she has already spoken with them about this and they will be happy to accept her as a patient as long as she has a referral from [place].   Smoking status reviewed. Patient is current every day smoker.   Review of Systems See HPI.     Objective:  Physical Exam  Constitutional:  Appears older than stated age, in NAD  HENT:  Head: Normocephalic and atraumatic.  Eyes: Conjunctivae and EOM are normal. Right eye exhibits no discharge. Left eye exhibits no discharge.  Pulmonary/Chest: Effort normal. No respiratory distress.  Musculoskeletal:  Limited ROM primarily of L leg  Neurological:  Some difficulty with ambulation, walks slowly, difficulty getting on and off exam table at times  Skin: Skin is warm and dry.  Psychiatric:  Affect and judgment normal.      Assessment & Plan:  Lower back pain Followed by [facility] and reportedly has a repeat procedure coming up soon. As such would like referral to pain clinic associated with [facility]. Placed referral today. Did not prescribe any medications.   Hypothyroidism Check TSH today. Will call patient if adjustment needs to be made in Synthroid dose.   T2DM (type 2 diabetes mellitus) (HCC) A1C 5.9 today, 5.8 at last appt. No need to begin medication or to continue rechecking A1C regularly for now.    Adin Hector, MD, MPH PGY-3 [facility] Pager [phone]

## 2016-08-23 NOTE — Assessment & Plan Note (Signed)
Check TSH today. Will call patient if adjustment needs to be made in Synthroid dose.

## 2016-08-23 NOTE — Patient Instructions (Signed)
It was nice meeting you today Ms. Perot!  I have placed the referral for the pain clinic. You will be contacted with the date and time of your appointment.   We are also testing your thyroid hormone level today. If we need to make any adjustments in your dose of Synthroid, I will call to let you know.   If you have any questions or concerns, please feel free to call the clinic.   Be well,  Dr. Avon Gully

## 2016-08-23 NOTE — Assessment & Plan Note (Signed)
Followed by [facility] and reportedly has a repeat procedure coming up soon. As such would like referral to pain clinic associated with [facility]. Placed referral today. Did not prescribe any medications.

## 2016-08-24 LAB — TSH: TSH: 1.65 u[IU]/mL (ref 0.450–4.500)

## 2016-08-28 ENCOUNTER — Other Ambulatory Visit: Payer: Self-pay | Admitting: Neurological Surgery

## 2016-09-12 ENCOUNTER — Encounter (HOSPITAL_COMMUNITY)
Admission: RE | Admit: 2016-09-12 | Discharge: 2016-09-12 | Disposition: A | Discharge status: Home / Self Care | Payer: No Typology Code available for payment source | Source: Ambulatory Visit | Attending: Neurological Surgery | Admitting: Neurological Surgery

## 2016-09-12 ENCOUNTER — Encounter (HOSPITAL_COMMUNITY): Payer: Self-pay

## 2016-09-12 ENCOUNTER — Telehealth: Payer: Self-pay | Admitting: *Deleted

## 2016-09-12 DIAGNOSIS — Z01812 Encounter for preprocedural laboratory examination: Secondary | ICD-10-CM | POA: Diagnosis present

## 2016-09-12 DIAGNOSIS — I129 Hypertensive chronic kidney disease with stage 1 through stage 4 chronic kidney disease, or unspecified chronic kidney disease: Secondary | ICD-10-CM | POA: Insufficient documentation

## 2016-09-12 DIAGNOSIS — G894 Chronic pain syndrome: Secondary | ICD-10-CM | POA: Diagnosis not present

## 2016-09-12 DIAGNOSIS — N183 Chronic kidney disease, stage 3 (moderate): Secondary | ICD-10-CM | POA: Insufficient documentation

## 2016-09-12 DIAGNOSIS — F17210 Nicotine dependence, cigarettes, uncomplicated: Secondary | ICD-10-CM | POA: Insufficient documentation

## 2016-09-12 DIAGNOSIS — R0902 Hypoxemia: Secondary | ICD-10-CM | POA: Insufficient documentation

## 2016-09-12 HISTORY — DX: Cardiac arrhythmia, unspecified: I49.9

## 2016-09-12 HISTORY — DX: Prediabetes: R73.03

## 2016-09-12 HISTORY — DX: Other complications of anesthesia, initial encounter: T88.59XA

## 2016-09-12 HISTORY — DX: Adverse effect of unspecified anesthetic, initial encounter: T41.45XA

## 2016-09-12 HISTORY — DX: Fibromyalgia: M79.7

## 2016-09-12 HISTORY — DX: Headache, unspecified: R51.9

## 2016-09-12 HISTORY — DX: Headache: R51

## 2016-09-12 HISTORY — DX: Personal history of other diseases of the digestive system: Z87.19

## 2016-09-12 LAB — CBC
HEMATOCRIT: 41.6 % (ref 36.0–46.0)
HEMOGLOBIN: 13.7 g/dL (ref 12.0–15.0)
MCH: 31.6 pg (ref 26.0–34.0)
MCHC: 32.9 g/dL (ref 30.0–36.0)
MCV: 95.9 fL (ref 78.0–100.0)
Platelets: 301 10*3/uL (ref 150–400)
RBC: 4.34 MIL/uL (ref 3.87–5.11)
RDW: 14.1 % (ref 11.5–15.5)
WBC: 8.1 10*3/uL (ref 4.0–10.5)

## 2016-09-12 LAB — BASIC METABOLIC PANEL
Anion gap: 6 (ref 5–15)
BUN: 13 mg/dL (ref 6–20)
CHLORIDE: 104 mmol/L (ref 101–111)
CO2: 31 mmol/L (ref 22–32)
Calcium: 9 mg/dL (ref 8.9–10.3)
Creatinine, Ser: 1.04 mg/dL — ABNORMAL HIGH (ref 0.44–1.00)
GFR calc Af Amer: >60 mL/min (ref >60)
GFR calc non Af Amer: 59 mL/min — ABNORMAL LOW (ref >60)
Glucose, Bld: 101 mg/dL — ABNORMAL HIGH (ref 65–99)
POTASSIUM: 4.3 mmol/L (ref 3.5–5.1)
SODIUM: 141 mmol/L (ref 135–145)

## 2016-09-12 LAB — SURGICAL PCR SCREEN
MRSA, PCR: NEGATIVE
STAPHYLOCOCCUS AUREUS: NEGATIVE

## 2016-09-12 NOTE — Pre-Procedure Instructions (Signed)
[name]  09/12/2016      [id number] [place], [place] - [address] [address] Phone: [phone] Fax: [phone]    Your procedure is scheduled on Aug. 27  Report to [facility] at 820 A.M.  Call this number if you have problems the morning of surgery:  [phone]   Remember:  Do not eat food or drink liquids after midnight.  Take these medicines the morning of surgery with A SIP OF WATER Allopurinol (Zyloprim), Baclofen (Lioresal), Colchicine if needed, Gabapentin (Neurontin), Lansoprazole (Prevacid), Levothyroxine (Synthroid), Metoprolol succinate (Toprol XL), Oxycodone if needed  Stop taking Aspirin, BC's, Goody's, Herbal medications, Fish Oil, Aleve, Ibuprofen, Advil, Motrin, Vitamins, Naprosyn     Do not wear jewelry, make-up or nail polish.  Do not wear lotions, powders, or perfumes, or deoderant.  Do not shave 48 hours prior to surgery.  Men may shave face and neck.  Do not bring valuables to the hospital.  [facility] is not responsible for any belongings or valuables.  Contacts, dentures or bridgework may not be worn into surgery.  Leave your suitcase in the car.  After surgery it may be brought to your room.  For patients admitted to the hospital, discharge time will be determined by your treatment team.  Patients discharged the day of surgery will not be allowed to drive home.    Special instructions:  [place] - Preparing for Surgery  Before surgery, you can play an important role.  Because skin is not sterile, your skin needs to be as free of germs as possible.  You can reduce the number of germs on you skin by washing with CHG (chlorahexidine gluconate) soap before surgery.  CHG is an antiseptic cleaner which kills germs and bonds with the skin to continue killing germs even after washing.  Please DO NOT use if you have an allergy to CHG or  antibacterial soaps.  If your skin becomes reddened/irritated stop using the CHG and inform your nurse when you arrive at Short Stay.  Do not shave (including legs and underarms) for at least 48 hours prior to the first CHG shower.  You may shave your face.  Please follow these instructions carefully:   1.  Shower with CHG Soap the night before surgery and the                                morning of Surgery.  2.  If you choose to wash your hair, wash your hair first as usual with your       normal shampoo.  3.  After you shampoo, rinse your hair and body thoroughly to remove the                      Shampoo.  4.  Use CHG as you would any other liquid soap.  You can apply chg directly       to the skin and wash gently with scrungie or a clean washcloth.  5.  Apply the CHG Soap to your body ONLY FROM THE NECK DOWN.        Do not use on open wounds or open sores.  Avoid contact with your eyes,       ears, mouth and genitals (private parts).  Wash genitals (private parts)  with your normal soap.  6.  Wash thoroughly, paying special attention to the area where your surgery        will be performed.  7.  Thoroughly rinse your body with warm water from the neck down.  8.  DO NOT shower/wash with your normal soap after using and rinsing off       the CHG Soap.  9.  Pat yourself dry with a clean towel.            10.  Wear clean pajamas.            11.  Place clean sheets on your bed the night of your first shower and do not        sleep with pets.  Day of Surgery  Do not apply any lotions/deoderants the morning of surgery.  Please wear clean clothes to the hospital/surgery center.     Please read over the following fact sheets that you were given. Pain Booklet, Coughing and Deep Breathing, MRSA Information and Surgical Site Infection Prevention

## 2016-09-12 NOTE — Progress Notes (Addendum)
PCP is Dr. Sydnee Levans Cardiologist is Dr. Ellyn Hack Denies any cough, fever, or chest pain. Reports last dose  Of aspirin was 09-06-16 Echo and stress test  Noted 2012 Wears O2 2liters at bedtime HGBA1c was 5.9 on 08-23-16 Reports that she doesn't check her CBG's at home, and was told she was not diabetic. States she may have had a stroke many years ago. She had numbness on the right side of her face (this was 20 or more years ago).  States she was told she had a heart murmur by Dr. Ellyn Hack. Reports that several times after surgery the Dr's and nurses couldn't fine her heart beat. She states the last few surgeries she has not had this problem.  Sleep study noted in epic 2017.
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
## 2016-09-13 ENCOUNTER — Ambulatory Visit: Payer: Medicare Other

## 2016-09-13 NOTE — Telephone Encounter (Signed)
Patient informed, placed in overflow slot this afternoon.
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
## 2016-09-16 ENCOUNTER — Encounter (HOSPITAL_COMMUNITY)
Admission: RE | Disposition: A | Discharge status: Home / Self Care | Payer: Self-pay | Source: Ambulatory Visit | Attending: Neurological Surgery

## 2016-09-16 ENCOUNTER — Inpatient Hospital Stay (HOSPITAL_COMMUNITY): Payer: No Typology Code available for payment source | Admitting: Emergency Medicine

## 2016-09-16 ENCOUNTER — Inpatient Hospital Stay (HOSPITAL_COMMUNITY): Payer: No Typology Code available for payment source

## 2016-09-16 ENCOUNTER — Inpatient Hospital Stay (HOSPITAL_COMMUNITY): Payer: No Typology Code available for payment source | Admitting: Certified Registered Nurse Anesthetist

## 2016-09-16 ENCOUNTER — Inpatient Hospital Stay (HOSPITAL_COMMUNITY)
Admission: RE | Admit: 2016-09-16 | Discharge: 2016-09-18 | DRG: 455 | Disposition: A | Discharge status: Home / Self Care | Payer: No Typology Code available for payment source | Source: Ambulatory Visit | Attending: Neurological Surgery | Admitting: Neurological Surgery

## 2016-09-16 ENCOUNTER — Encounter (HOSPITAL_COMMUNITY): Payer: Self-pay | Admitting: Certified Registered Nurse Anesthetist

## 2016-09-16 DIAGNOSIS — M5416 Radiculopathy, lumbar region: Secondary | ICD-10-CM | POA: Diagnosis present

## 2016-09-16 DIAGNOSIS — Z888 Allergy status to other drugs, medicaments and biological substances status: Secondary | ICD-10-CM

## 2016-09-16 DIAGNOSIS — E039 Hypothyroidism, unspecified: Secondary | ICD-10-CM | POA: Diagnosis present

## 2016-09-16 DIAGNOSIS — F17210 Nicotine dependence, cigarettes, uncomplicated: Secondary | ICD-10-CM | POA: Diagnosis present

## 2016-09-16 DIAGNOSIS — Z9981 Dependence on supplemental oxygen: Secondary | ICD-10-CM

## 2016-09-16 DIAGNOSIS — Z886 Allergy status to analgesic agent status: Secondary | ICD-10-CM | POA: Diagnosis not present

## 2016-09-16 DIAGNOSIS — M797 Fibromyalgia: Secondary | ICD-10-CM | POA: Diagnosis present

## 2016-09-16 DIAGNOSIS — K219 Gastro-esophageal reflux disease without esophagitis: Secondary | ICD-10-CM | POA: Diagnosis present

## 2016-09-16 DIAGNOSIS — N183 Chronic kidney disease, stage 3 (moderate): Secondary | ICD-10-CM | POA: Diagnosis present

## 2016-09-16 DIAGNOSIS — F419 Anxiety disorder, unspecified: Secondary | ICD-10-CM | POA: Diagnosis present

## 2016-09-16 DIAGNOSIS — Z91048 Other nonmedicinal substance allergy status: Secondary | ICD-10-CM | POA: Diagnosis not present

## 2016-09-16 DIAGNOSIS — Z981 Arthrodesis status: Secondary | ICD-10-CM

## 2016-09-16 DIAGNOSIS — I129 Hypertensive chronic kidney disease with stage 1 through stage 4 chronic kidney disease, or unspecified chronic kidney disease: Secondary | ICD-10-CM | POA: Diagnosis present

## 2016-09-16 DIAGNOSIS — Z91040 Latex allergy status: Secondary | ICD-10-CM

## 2016-09-16 DIAGNOSIS — Z96651 Presence of right artificial knee joint: Secondary | ICD-10-CM | POA: Diagnosis present

## 2016-09-16 DIAGNOSIS — M4316 Spondylolisthesis, lumbar region: Secondary | ICD-10-CM | POA: Diagnosis present

## 2016-09-16 DIAGNOSIS — E785 Hyperlipidemia, unspecified: Secondary | ICD-10-CM | POA: Diagnosis present

## 2016-09-16 DIAGNOSIS — Z419 Encounter for procedure for purposes other than remedying health state, unspecified: Secondary | ICD-10-CM

## 2016-09-16 DIAGNOSIS — E669 Obesity, unspecified: Secondary | ICD-10-CM | POA: Diagnosis present

## 2016-09-16 DIAGNOSIS — Z885 Allergy status to narcotic agent status: Secondary | ICD-10-CM | POA: Diagnosis not present

## 2016-09-16 DIAGNOSIS — Z6828 Body mass index (BMI) 28.0-28.9, adult: Secondary | ICD-10-CM | POA: Diagnosis not present

## 2016-09-16 LAB — TYPE AND SCREEN
ABO/RH(D): O POS
Antibody Screen: NEGATIVE

## 2016-09-16 LAB — GLUCOSE, CAPILLARY: Glucose-Capillary: 95 mg/dL (ref 65–99)

## 2016-09-16 SURGERY — POSTERIOR LUMBAR FUSION 1 LEVEL
Anesthesia: General | Site: Back

## 2016-09-16 MED ORDER — METHOCARBAMOL 500 MG PO TABS
500.0000 mg | ORAL_TABLET | Freq: Four times a day (QID) | ORAL | Status: DC | PRN
  Administered 2016-09-16 – 2016-09-18 (×4): 500 mg via ORAL
  Filled 2016-09-16 (×3): qty 1

## 2016-09-16 MED ORDER — ROCURONIUM BROMIDE 100 MG/10ML IV SOLN
INTRAVENOUS | Status: DC | PRN
  Administered 2016-09-16: 60 mg via INTRAVENOUS

## 2016-09-16 MED ORDER — OXYCODONE HCL 5 MG/5ML PO SOLN: 5.0000 mg | Freq: Once | ORAL | Status: DC | PRN

## 2016-09-16 MED ORDER — SODIUM CHLORIDE 0.9% FLUSH: 3.0000 mL | INTRAVENOUS | Status: DC | PRN

## 2016-09-16 MED ORDER — ALLOPURINOL 100 MG PO TABS
100.0000 mg | ORAL_TABLET | Freq: Every morning | ORAL | Status: DC
  Administered 2016-09-17: 100 mg via ORAL
  Filled 2016-09-16 (×2): qty 1

## 2016-09-16 MED ORDER — THROMBIN 5000 UNITS EX SOLR
CUTANEOUS | Status: AC
  Filled 2016-09-16: qty 5000

## 2016-09-16 MED ORDER — LIDOCAINE 2% (20 MG/ML) 5 ML SYRINGE
INTRAMUSCULAR | Status: AC
  Filled 2016-09-16: qty 5

## 2016-09-16 MED ORDER — DOCUSATE SODIUM 100 MG PO CAPS
100.0000 mg | ORAL_CAPSULE | Freq: Two times a day (BID) | ORAL | Status: DC
  Administered 2016-09-16: 100 mg via ORAL
  Filled 2016-09-16 (×3): qty 1

## 2016-09-16 MED ORDER — KETAMINE HCL-SODIUM CHLORIDE 100-0.9 MG/10ML-% IV SOSY
PREFILLED_SYRINGE | INTRAVENOUS | Status: AC
  Filled 2016-09-16: qty 10

## 2016-09-16 MED ORDER — LACTATED RINGERS IV SOLN
INTRAVENOUS | Status: DC
  Administered 2016-09-16 (×2): via INTRAVENOUS

## 2016-09-16 MED ORDER — CEFAZOLIN SODIUM-DEXTROSE 2-4 GM/100ML-% IV SOLN
2.0000 g | INTRAVENOUS | Status: DC
  Filled 2016-09-16: qty 100

## 2016-09-16 MED ORDER — MIDAZOLAM HCL 5 MG/5ML IJ SOLN
INTRAMUSCULAR | Status: DC | PRN
  Administered 2016-09-16: 2 mg via INTRAVENOUS

## 2016-09-16 MED ORDER — CEFAZOLIN SODIUM-DEXTROSE 2-3 GM-% IV SOLR
INTRAVENOUS | Status: DC | PRN
  Administered 2016-09-16: 2 g via INTRAVENOUS

## 2016-09-16 MED ORDER — METHOCARBAMOL 500 MG PO TABS
ORAL_TABLET | ORAL | Status: AC
  Administered 2016-09-16: 500 mg via ORAL
  Filled 2016-09-16: qty 1

## 2016-09-16 MED ORDER — KETAMINE HCL 10 MG/ML IJ SOLN
INTRAMUSCULAR | Status: DC | PRN
  Administered 2016-09-16: 40 mg via INTRAVENOUS

## 2016-09-16 MED ORDER — COLCHICINE 0.6 MG PO TABS: 0.6000 mg | ORAL_TABLET | Freq: Every day | ORAL | Status: DC | PRN

## 2016-09-16 MED ORDER — 0.9 % SODIUM CHLORIDE (POUR BTL) OPTIME
TOPICAL | Status: DC | PRN
  Administered 2016-09-16: 1000 mL

## 2016-09-16 MED ORDER — ALBUMIN HUMAN 5 % IV SOLN
INTRAVENOUS | Status: DC | PRN
  Administered 2016-09-16: 14:00:00 via INTRAVENOUS

## 2016-09-16 MED ORDER — DEXAMETHASONE SODIUM PHOSPHATE 10 MG/ML IJ SOLN
INTRAMUSCULAR | Status: DC | PRN
  Administered 2016-09-16: 10 mg via INTRAVENOUS

## 2016-09-16 MED ORDER — FLEET ENEMA 7-19 GM/118ML RE ENEM: 1.0000 | ENEMA | Freq: Once | RECTAL | Status: DC | PRN

## 2016-09-16 MED ORDER — BACLOFEN 10 MG PO TABS
10.0000 mg | ORAL_TABLET | Freq: Two times a day (BID) | ORAL | Status: DC | PRN
  Administered 2016-09-17: 10 mg via ORAL
  Filled 2016-09-16 (×2): qty 1

## 2016-09-16 MED ORDER — LIDOCAINE-EPINEPHRINE 1 %-1:100000 IJ SOLN
INTRAMUSCULAR | Status: DC | PRN
  Administered 2016-09-16: 5 mL

## 2016-09-16 MED ORDER — HYDROMORPHONE HCL 1 MG/ML IJ SOLN
INTRAMUSCULAR | Status: AC
  Administered 2016-09-16: 0.5 mg via INTRAVENOUS
  Filled 2016-09-16: qty 1

## 2016-09-16 MED ORDER — ARTIFICIAL TEARS OPHTHALMIC OINT
TOPICAL_OINTMENT | OPHTHALMIC | Status: DC | PRN
  Administered 2016-09-16: 1 via OPHTHALMIC

## 2016-09-16 MED ORDER — LEVOTHYROXINE SODIUM 100 MCG PO TABS
50.0000 ug | ORAL_TABLET | Freq: Every day | ORAL | Status: DC
Start: 2016-09-17 — End: 2016-09-18
  Administered 2016-09-17 – 2016-09-18 (×2): 50 ug via ORAL
  Filled 2016-09-16 (×2): qty 1

## 2016-09-16 MED ORDER — CEFAZOLIN SODIUM-DEXTROSE 2-4 GM/100ML-% IV SOLN
2.0000 g | Freq: Three times a day (TID) | INTRAVENOUS | Status: AC
  Administered 2016-09-16 – 2016-09-17 (×2): 2 g via INTRAVENOUS
  Filled 2016-09-16 (×2): qty 100

## 2016-09-16 MED ORDER — LORAZEPAM 0.5 MG PO TABS: 1.0000 mg | ORAL_TABLET | ORAL | Status: DC

## 2016-09-16 MED ORDER — MENTHOL 3 MG MT LOZG: 1.0000 | LOZENGE | OROMUCOSAL | Status: DC | PRN

## 2016-09-16 MED ORDER — SODIUM CHLORIDE 0.9 % IV SOLN: 250.0000 mL | INTRAVENOUS | Status: DC

## 2016-09-16 MED ORDER — SODIUM CHLORIDE 0.9% FLUSH
3.0000 mL | Freq: Two times a day (BID) | INTRAVENOUS | Status: DC
Start: 2016-09-16 — End: 2016-09-16

## 2016-09-16 MED ORDER — DEXAMETHASONE SODIUM PHOSPHATE 10 MG/ML IJ SOLN
INTRAMUSCULAR | Status: AC
  Filled 2016-09-16: qty 2

## 2016-09-16 MED ORDER — CHLORHEXIDINE GLUCONATE CLOTH 2 % EX PADS: 6.0000 | MEDICATED_PAD | Freq: Once | CUTANEOUS | Status: DC

## 2016-09-16 MED ORDER — LIDOCAINE HCL (CARDIAC) 20 MG/ML IV SOLN
INTRAVENOUS | Status: DC | PRN
  Administered 2016-09-16: 60 mg via INTRAVENOUS

## 2016-09-16 MED ORDER — MIDAZOLAM HCL 2 MG/2ML IJ SOLN
INTRAMUSCULAR | Status: AC
  Filled 2016-09-16: qty 2

## 2016-09-16 MED ORDER — HYDROCODONE-ACETAMINOPHEN 5-325 MG PO TABS
ORAL_TABLET | ORAL | Status: AC
  Administered 2016-09-16: 1 via ORAL
  Filled 2016-09-16: qty 1

## 2016-09-16 MED ORDER — BUPIVACAINE HCL (PF) 0.5 % IJ SOLN
INTRAMUSCULAR | Status: DC | PRN
  Administered 2016-09-16: 5 mL

## 2016-09-16 MED ORDER — ONDANSETRON HCL 4 MG PO TABS: 4.0000 mg | ORAL_TABLET | Freq: Four times a day (QID) | ORAL | Status: DC | PRN

## 2016-09-16 MED ORDER — THROMBIN 20000 UNITS EX SOLR
CUTANEOUS | Status: DC | PRN
  Administered 2016-09-16: 12:00:00 via TOPICAL

## 2016-09-16 MED ORDER — THROMBIN 5000 UNITS EX SOLR
OROMUCOSAL | Status: DC | PRN
  Administered 2016-09-16 (×2): via TOPICAL

## 2016-09-16 MED ORDER — POLYETHYLENE GLYCOL 3350 17 G PO PACK: 17.0000 g | PACK | Freq: Every day | ORAL | Status: DC | PRN

## 2016-09-16 MED ORDER — ACETAMINOPHEN 325 MG PO TABS: 650.0000 mg | ORAL_TABLET | ORAL | Status: DC | PRN

## 2016-09-16 MED ORDER — ROCURONIUM BROMIDE 10 MG/ML (PF) SYRINGE
PREFILLED_SYRINGE | INTRAVENOUS | Status: AC
  Filled 2016-09-16: qty 5

## 2016-09-16 MED ORDER — FENTANYL CITRATE (PF) 100 MCG/2ML IJ SOLN
INTRAMUSCULAR | Status: DC | PRN
  Administered 2016-09-16: 50 ug via INTRAVENOUS
  Administered 2016-09-16 (×2): 100 ug via INTRAVENOUS
  Administered 2016-09-16 (×2): 50 ug via INTRAVENOUS

## 2016-09-16 MED ORDER — BUPIVACAINE HCL (PF) 0.5 % IJ SOLN
INTRAMUSCULAR | Status: AC
  Filled 2016-09-16: qty 30

## 2016-09-16 MED ORDER — NALOXEGOL OXALATE 25 MG PO TABS: 25.0000 mg | ORAL_TABLET | Freq: Every day | ORAL | Status: DC

## 2016-09-16 MED ORDER — SUGAMMADEX SODIUM 200 MG/2ML IV SOLN
INTRAVENOUS | Status: DC | PRN
  Administered 2016-09-16: 150 mg via INTRAVENOUS

## 2016-09-16 MED ORDER — BISACODYL 10 MG RE SUPP: 10.0000 mg | Freq: Every day | RECTAL | Status: DC | PRN

## 2016-09-16 MED ORDER — THROMBIN 20000 UNITS EX SOLR
CUTANEOUS | Status: AC
  Filled 2016-09-16: qty 20000

## 2016-09-16 MED ORDER — LORAZEPAM 0.5 MG PO TABS
1.0000 mg | ORAL_TABLET | ORAL | Status: DC
  Administered 2016-09-16 – 2016-09-17 (×3): 1 mg via ORAL
  Filled 2016-09-16 (×3): qty 2

## 2016-09-16 MED ORDER — HYDROMORPHONE HCL 1 MG/ML IJ SOLN
0.5000 mg | INTRAMUSCULAR | Status: DC | PRN
  Administered 2016-09-16 – 2016-09-18 (×6): 0.5 mg via INTRAVENOUS
  Filled 2016-09-16 (×6): qty 0.5

## 2016-09-16 MED ORDER — ARTIFICIAL TEARS OPHTHALMIC OINT
TOPICAL_OINTMENT | OPHTHALMIC | Status: AC
  Filled 2016-09-16: qty 3.5

## 2016-09-16 MED ORDER — ATORVASTATIN CALCIUM 20 MG PO TABS
20.0000 mg | ORAL_TABLET | Freq: Every day | ORAL | Status: DC
  Administered 2016-09-16: 20 mg via ORAL
  Filled 2016-09-16 (×2): qty 1

## 2016-09-16 MED ORDER — PROPOFOL 10 MG/ML IV BOLUS
INTRAVENOUS | Status: DC | PRN
  Administered 2016-09-16: 160 mg via INTRAVENOUS

## 2016-09-16 MED ORDER — TRAZODONE HCL 50 MG PO TABS
50.0000 mg | ORAL_TABLET | Freq: Every day | ORAL | Status: DC
  Administered 2016-09-16 – 2016-09-17 (×2): 50 mg via ORAL
  Filled 2016-09-16 (×2): qty 1

## 2016-09-16 MED ORDER — LORAZEPAM 0.5 MG PO TABS: 1.0000 mg | ORAL_TABLET | Freq: Every day | ORAL | Status: DC | PRN

## 2016-09-16 MED ORDER — EPHEDRINE 5 MG/ML INJ
INTRAVENOUS | Status: AC
  Filled 2016-09-16: qty 10

## 2016-09-16 MED ORDER — NICOTINE 21 MG/24HR TD PT24
21.0000 mg | MEDICATED_PATCH | Freq: Every day | TRANSDERMAL | Status: DC
  Administered 2016-09-16 – 2016-09-17 (×2): 21 mg via TRANSDERMAL
  Filled 2016-09-16 (×3): qty 1

## 2016-09-16 MED ORDER — HYDROMORPHONE HCL 1 MG/ML IJ SOLN
0.2500 mg | INTRAMUSCULAR | Status: DC | PRN
  Administered 2016-09-16 (×4): 0.5 mg via INTRAVENOUS

## 2016-09-16 MED ORDER — ONDANSETRON HCL 4 MG/2ML IJ SOLN
INTRAMUSCULAR | Status: DC | PRN
  Administered 2016-09-16: 4 mg via INTRAVENOUS

## 2016-09-16 MED ORDER — ACETAMINOPHEN 650 MG RE SUPP: 650.0000 mg | RECTAL | Status: DC | PRN

## 2016-09-16 MED ORDER — LACTATED RINGERS IV SOLN: INTRAVENOUS | Status: DC

## 2016-09-16 MED ORDER — EPHEDRINE SULFATE 50 MG/ML IJ SOLN
INTRAMUSCULAR | Status: DC | PRN
  Administered 2016-09-16 (×3): 5 mg via INTRAVENOUS
  Administered 2016-09-16 (×2): 10 mg via INTRAVENOUS
  Administered 2016-09-16: 5 mg via INTRAVENOUS

## 2016-09-16 MED ORDER — SODIUM CHLORIDE 0.9 % IR SOLN
Status: DC | PRN
  Administered 2016-09-16: 12:00:00

## 2016-09-16 MED ORDER — METOPROLOL SUCCINATE ER 25 MG PO TB24
50.0000 mg | ORAL_TABLET | Freq: Two times a day (BID) | ORAL | Status: DC
  Administered 2016-09-16 – 2016-09-17 (×3): 50 mg via ORAL
  Filled 2016-09-16 (×3): qty 2

## 2016-09-16 MED ORDER — FENTANYL CITRATE (PF) 250 MCG/5ML IJ SOLN
INTRAMUSCULAR | Status: AC
  Filled 2016-09-16: qty 5

## 2016-09-16 MED ORDER — SENNA 8.6 MG PO TABS
1.0000 | ORAL_TABLET | Freq: Two times a day (BID) | ORAL | Status: DC
  Administered 2016-09-16: 8.6 mg via ORAL
  Filled 2016-09-16 (×3): qty 1

## 2016-09-16 MED ORDER — DEXTROSE 5 % IV SOLN
500.0000 mg | Freq: Four times a day (QID) | INTRAVENOUS | Status: DC | PRN
  Filled 2016-09-16: qty 5

## 2016-09-16 MED ORDER — HYDROCODONE-ACETAMINOPHEN 5-325 MG PO TABS
1.0000 | ORAL_TABLET | ORAL | Status: DC | PRN
  Administered 2016-09-16: 1 via ORAL
  Administered 2016-09-16 – 2016-09-18 (×8): 2 via ORAL
  Filled 2016-09-16 (×8): qty 2

## 2016-09-16 MED ORDER — LIDOCAINE-EPINEPHRINE 1 %-1:100000 IJ SOLN
INTRAMUSCULAR | Status: AC
  Filled 2016-09-16: qty 1

## 2016-09-16 MED ORDER — PANTOPRAZOLE SODIUM 20 MG PO TBEC
20.0000 mg | DELAYED_RELEASE_TABLET | Freq: Every day | ORAL | Status: DC
  Administered 2016-09-17: 20 mg via ORAL
  Filled 2016-09-16: qty 1

## 2016-09-16 MED ORDER — PROPOFOL 10 MG/ML IV BOLUS
INTRAVENOUS | Status: AC
  Filled 2016-09-16: qty 20

## 2016-09-16 MED ORDER — ONDANSETRON HCL 4 MG/2ML IJ SOLN: 4.0000 mg | Freq: Four times a day (QID) | INTRAMUSCULAR | Status: DC | PRN

## 2016-09-16 MED ORDER — OXYCODONE HCL 5 MG PO TABS: 5.0000 mg | ORAL_TABLET | Freq: Once | ORAL | Status: DC | PRN

## 2016-09-16 MED ORDER — ONDANSETRON HCL 4 MG/2ML IJ SOLN
INTRAMUSCULAR | Status: AC
  Filled 2016-09-16: qty 2

## 2016-09-16 MED ORDER — PHENOL 1.4 % MT LIQD: 1.0000 | OROMUCOSAL | Status: DC | PRN

## 2016-09-16 MED ORDER — METHOCARBAMOL 500 MG PO TABS
500.0000 mg | ORAL_TABLET | Freq: Two times a day (BID) | ORAL | Status: DC
  Administered 2016-09-16 – 2016-09-17 (×3): 500 mg via ORAL
  Filled 2016-09-16 (×3): qty 1

## 2016-09-16 SURGICAL SUPPLY — 74 items
ADH SKN CLS APL DERMABOND .7 (GAUZE/BANDAGES/DRESSINGS) ×1
APL SRG 60D 8 XTD TIP BNDBL (TIP)
BAG DECANTER FOR FLEXI CONT (MISCELLANEOUS) ×3 IMPLANT
BASKET BONE COLLECTION (BASKET) ×3 IMPLANT
BLADE CLIPPER SURG (BLADE) IMPLANT
BONE CANC CHIPS 20CC PCAN1/4 (Bone Implant) ×3 IMPLANT
BUR MATCHSTICK NEURO 3.0 LAGG (BURR) ×3 IMPLANT
CAGE MAS PLIF 9X9X23-8 LUMBAR (Cage) ×4 IMPLANT
CANISTER SUCT 3000ML PPV (MISCELLANEOUS) ×3 IMPLANT
CARTRIDGE OIL MAESTRO DRILL (MISCELLANEOUS) ×1 IMPLANT
CHIPS CANC BONE 20CC PCAN1/4 (Bone Implant) ×1 IMPLANT
CONT SPEC 4OZ CLIKSEAL STRL BL (MISCELLANEOUS) ×3 IMPLANT
COVER BACK TABLE 60X90IN (DRAPES) ×3 IMPLANT
DECANTER SPIKE VIAL GLASS SM (MISCELLANEOUS) ×3 IMPLANT
DERMABOND ADVANCED (GAUZE/BANDAGES/DRESSINGS) ×2
DERMABOND ADVANCED .7 DNX12 (GAUZE/BANDAGES/DRESSINGS) ×1 IMPLANT
DEVICE DISSECT PLASMABLAD 3.0S (MISCELLANEOUS) ×1 IMPLANT
DIFFUSER DRILL AIR PNEUMATIC (MISCELLANEOUS) ×3 IMPLANT
DRAPE C-ARM 42X72 X-RAY (DRAPES) ×6 IMPLANT
DRAPE HALF SHEET 40X57 (DRAPES) IMPLANT
DRAPE LAPAROTOMY 100X72X124 (DRAPES) ×3 IMPLANT
DRAPE POUCH INSTRU U-SHP 10X18 (DRAPES) ×3 IMPLANT
DRSG OPSITE POSTOP 4X6 (GAUZE/BANDAGES/DRESSINGS) ×2 IMPLANT
DURAPREP 26ML APPLICATOR (WOUND CARE) ×3 IMPLANT
DURASEAL APPLICATOR TIP (TIP) IMPLANT
DURASEAL SPINE SEALANT 3ML (MISCELLANEOUS) IMPLANT
ELECT REM PT RETURN 9FT ADLT (ELECTROSURGICAL) ×3
ELECTRODE REM PT RTRN 9FT ADLT (ELECTROSURGICAL) ×1 IMPLANT
GAUZE SPONGE 4X4 12PLY STRL (GAUZE/BANDAGES/DRESSINGS) ×3 IMPLANT
GAUZE SPONGE 4X4 16PLY XRAY LF (GAUZE/BANDAGES/DRESSINGS) IMPLANT
GLOVE BIOGEL PI IND STRL 6.5 (GLOVE) IMPLANT
GLOVE BIOGEL PI IND STRL 7.5 (GLOVE) IMPLANT
GLOVE BIOGEL PI IND STRL 8.5 (GLOVE) ×2 IMPLANT
GLOVE BIOGEL PI INDICATOR 6.5 (GLOVE) ×2
GLOVE BIOGEL PI INDICATOR 7.5 (GLOVE) ×2
GLOVE BIOGEL PI INDICATOR 8.5 (GLOVE) ×4
GLOVE ECLIPSE 8.5 STRL (GLOVE) ×4 IMPLANT
GLOVE SURG SS PI 6.5 STRL IVOR (GLOVE) ×2 IMPLANT
GLOVE SURG SS PI 8.5 STRL IVOR (GLOVE) ×2
GLOVE SURG SS PI 8.5 STRL STRW (GLOVE) IMPLANT
GOWN STRL REUS W/ TWL LRG LVL3 (GOWN DISPOSABLE) IMPLANT
GOWN STRL REUS W/ TWL XL LVL3 (GOWN DISPOSABLE) IMPLANT
GOWN STRL REUS W/TWL 2XL LVL3 (GOWN DISPOSABLE) ×6 IMPLANT
GOWN STRL REUS W/TWL LRG LVL3 (GOWN DISPOSABLE)
GOWN STRL REUS W/TWL XL LVL3 (GOWN DISPOSABLE)
GRAFT BNE CANC CHIPS 1-8 20CC (Bone Implant) IMPLANT
HEMOSTAT POWDER KIT SURGIFOAM (HEMOSTASIS) ×4 IMPLANT
KIT BASIN OR (CUSTOM PROCEDURE TRAY) ×3 IMPLANT
KIT ROOM TURNOVER OR (KITS) ×3 IMPLANT
MODULE POWER NUVASIVE (MISCELLANEOUS) IMPLANT
NEEDLE HYPO 22GX1.5 SAFETY (NEEDLE) ×3 IMPLANT
NS IRRIG 1000ML POUR BTL (IV SOLUTION) ×3 IMPLANT
OIL CARTRIDGE MAESTRO DRILL (MISCELLANEOUS) ×3
PACK LAMINECTOMY NEURO (CUSTOM PROCEDURE TRAY) ×3 IMPLANT
PAD ARMBOARD 7.5X6 YLW CONV (MISCELLANEOUS) ×9 IMPLANT
PATTIES SURGICAL .5 X1 (DISPOSABLE) ×3 IMPLANT
PLASMABLADE 3.0S (MISCELLANEOUS) ×3
POWER MODULE NUVASIVE (MISCELLANEOUS) ×3
ROD RELINE-O LORD 5.5X35MM (Rod) ×4 IMPLANT
SCREW LOCK RELINE 5.5 TULIP (Screw) ×8 IMPLANT
SCREW RELINE-O POLY 6.5X45 (Screw) ×8 IMPLANT
SPONGE LAP 4X18 X RAY DECT (DISPOSABLE) IMPLANT
SPONGE SURGIFOAM ABS GEL 100 (HEMOSTASIS) ×3 IMPLANT
SUT PROLENE 6 0 BV (SUTURE) IMPLANT
SUT VIC AB 1 CT1 18XBRD ANBCTR (SUTURE) ×1 IMPLANT
SUT VIC AB 1 CT1 8-18 (SUTURE) ×3
SUT VIC AB 2-0 CP2 18 (SUTURE) ×3 IMPLANT
SUT VIC AB 3-0 SH 8-18 (SUTURE) ×3 IMPLANT
SYR 3ML LL SCALE MARK (SYRINGE) ×12 IMPLANT
TOWEL GREEN STERILE (TOWEL DISPOSABLE) ×3 IMPLANT
TOWEL GREEN STERILE FF (TOWEL DISPOSABLE) ×3 IMPLANT
TRAY FOLEY BAG SILVER LF 16FR (SET/KITS/TRAYS/PACK) ×2 IMPLANT
TRAY FOLEY W/METER SILVER 16FR (SET/KITS/TRAYS/PACK) ×1 IMPLANT
WATER STERILE IRR 1000ML POUR (IV SOLUTION) ×3 IMPLANT

## 2016-09-16 NOTE — Transfer of Care (Signed)
Immediate Anesthesia Transfer of Care Note  Patient: [name]  Procedure(s) Performed: Procedure(s) with comments: Lumbar Three- Four Posterior lumbar interbody fusion (N/A) - L3-4 Posterior lumbar interbody fusion  Patient Location: PACU  Anesthesia Type:General  Level of Consciousness: awake, alert  and oriented  Airway & Oxygen Therapy: Patient Spontanous Breathing and Patient connected to nasal cannula oxygen  Post-op Assessment: Report given to RN and Post -op Vital signs reviewed and stable  Post vital signs: Reviewed and stable  Last Vitals:  Vitals:   09/16/16 0904 09/16/16 1446  BP: 112/71   Pulse: 60 77  Resp: 18 15  Temp: 37.1 C   SpO2: 97% 96%    Last Pain:  Vitals:   09/16/16 0916  TempSrc:   PainSc: 9          Complications: No apparent anesthesia complications

## 2016-09-16 NOTE — Anesthesia Procedure Notes (Signed)
Procedure Name: Intubation Date/Time: 09/16/2016 11:19 AM Performed by: Candis Shine Pre-anesthesia Checklist: Patient identified, Emergency Drugs available, Suction available and Patient being monitored Patient Re-evaluated:Patient Re-evaluated prior to induction Oxygen Delivery Method: Circle System Utilized Preoxygenation: Pre-oxygenation with 100% oxygen Induction Type: IV induction Ventilation: Mask ventilation without difficulty Laryngoscope Size: Mac and 3 Grade View: Grade I Tube type: Oral Tube size: 7.0 mm Number of attempts: 1 Airway Equipment and Method: Stylet Placement Confirmation: ETT inserted through vocal cords under direct vision,  positive ETCO2 and breath sounds checked- equal and bilateral Secured at: 22 cm Tube secured with: Tape Dental Injury: Teeth and Oropharynx as per pre-operative assessment

## 2016-09-16 NOTE — Anesthesia Preprocedure Evaluation (Signed)
Anesthesia Evaluation  Patient identified by MRN, date of birth, ID band Patient awake    Reviewed: Allergy & Precautions, NPO status , Patient's Chart, lab work & pertinent test results, reviewed documented beta blocker date and time   History of Anesthesia Complications Negative for: history of anesthetic complications  Airway Mallampati: II  TM Distance: >3 FB Neck ROM: Full    Dental  (+) Upper Dentures, Poor Dentition,    Pulmonary neg shortness of breath, sleep apnea and Oxygen sleep apnea , neg COPD, Current Smoker,    breath sounds clear to auscultation       Cardiovascular (-) angina+ dysrhythmias Supra Ventricular Tachycardia  Rhythm:Regular     Neuro/Psych  Headaches, PSYCHIATRIC DISORDERS Anxiety Depression  Neuromuscular disease    GI/Hepatic Neg liver ROS, hiatal hernia, PUD, GERD  Medicated and Controlled,  Endo/Other  diabetesHypothyroidism   Renal/GU CRFRenal diseaseSingle kidney s/p trauma     Musculoskeletal  (+) Arthritis , Fibromyalgia -  Abdominal   Peds  Hematology   Anesthesia Other Findings   Reproductive/Obstetrics                             Anesthesia Physical Anesthesia Plan  ASA: III  Anesthesia Plan: General   Post-op Pain Management:    Induction: Intravenous  PONV Risk Score and Plan: 2 and Ondansetron and Dexamethasone  Airway Management Planned: Oral ETT  Additional Equipment: None  Intra-op Plan:   Post-operative Plan: Extubation in OR  Informed Consent: I have reviewed the patients History and Physical, chart, labs and discussed the procedure including the risks, benefits and alternatives for the proposed anesthesia with the patient or authorized representative who has indicated his/her understanding and acceptance.   Dental advisory given  Plan Discussed with: CRNA and Surgeon  Anesthesia Plan Comments:         Anesthesia Quick  Evaluation

## 2016-09-16 NOTE — H&P (Signed)
[name] is an 56 y.o. female.   Chief Complaint: Bilateral lower extremity pain HPI: Patient is 56 year old individual is had previous decompression fusion at L4-5 and also at L5-S1. She is had significant arthropathies elsewhere including her knees and her hips but has now developed worsening back and leg pain she has a known spondylolisthesis at the level of L3-L4 which is been getting progressively worse. She's been advised regarding the need for surgical decompression and stabilization and is now admitted for that procedure.  Past Medical History:  Diagnosis Date  . Allergy   . Ankylosing spondylitis ([place])   . Anxiety   . Arthritis   . Chronic pain syndrome   . CKD (chronic kidney disease) stage 3, GFR 30-59 ml/min   . Complication of anesthesia    reports that they couldn't find her heart beat after anesthia, but last few times no problems   . Depression   . Dysrhythmia   . Fibromyalgia   . Gastroparesis   . GERD (gastroesophageal reflux disease)   . Headache   . History of hiatal hernia   . Hyperlipidemia   . Hypertension   . Hypothyroidism   . Nephrolithiasis   . Obesity   . On home oxygen therapy    uses 2L/[place] at night  . Osteoporosis   . Pre-diabetes   . Tobacco abuse     Past Surgical History:  Procedure Laterality Date  . adnoidectomy    . APPENDECTOMY    . BACK SURGERY     x4  . CHOLECYSTECTOMY    . DOPPLER ECHOCARDIOGRAPHY  09/26/2010   EF=>55%, LV norm  . KNEE ARTHROSCOPY Right   . MANDIBLE FRACTURE SURGERY    . NECK SURGERY    . NM MYOCAR PERF WALL MOTION  09/26/2010   Lexiscan EF 83%  EF may be overestimated due to LVH  . RIGHT KNEE ARTHROPLASTY    . TONSILLECTOMY    . TOTAL KNEE ARTHROPLASTY     left  . TUBAL LIGATION      Family History  Problem Relation Age of Onset  . Diabetes Mother   . Heart failure Mother   . Hyperlipidemia Mother   . Hypertension Mother   . Hyperlipidemia Father   . Cancer Son        lymphoma stage 4 at 70    Social History:  reports that she has been smoking Cigarettes.  She has a 32.00 pack-year smoking history. She has never used smokeless tobacco. She reports that she does not drink alcohol or use drugs.  Allergies:  Allergies  Allergen Reactions  . Codeine Nausea And Vomiting and Other (See Comments)    "Will stop her heart" CARDIAC ARREST (per [facility])  . Aleve [Naproxen] Other (See Comments)    Was told by physician to NOT take this  . Latex Itching  . Lipitor [Atorvastatin] Other (See Comments)    Muscle aches  . Nyquil Severe Cold-Flu [Phenyleph-Doxylamine-Dm-Apap] Other (See Comments)    WAS TOLD TO NOT TAKE THIS  . Tape Other (See Comments)    Plastic takes off skin; please use an alternative!!  . Tylenol [Acetaminophen] Other (See Comments)    Was told by physician to NOT take "plain Tylenol"  . Acyclovir And Related Itching and Rash    Medications Prior to Admission  Medication Sig Dispense Refill  . allopurinol (ZYLOPRIM) 100 MG tablet Take 1 tablet (100 mg total) by mouth every morning. (Patient taking differently: Take 100  mg by mouth daily. ) 60 tablet 5  . Aromatic Inhalants (VICKS VAPOINHALER) INHA One to two inhalations into each nostril at bedtime    . aspirin EC 81 MG tablet Take 81 mg by mouth at bedtime. STOP PRIOR TO PROCEDURE    . atorvastatin (LIPITOR) 20 MG tablet Take 1 tablet (20 mg total) by mouth daily. (Patient taking differently: Take 20 mg by mouth at bedtime. ) 30 tablet 3  . baclofen (LIORESAL) 10 MG tablet Take 1 tablet (10 mg total) by mouth 2 (two) times daily as needed for muscle spasms. 60 each 3  . gabapentin (NEURONTIN) 300 MG capsule Take 1 capsule (300 mg total) by mouth daily. (Patient taking differently: Take 300 mg by mouth at bedtime. ) 90 capsule 3  . lansoprazole (PREVACID) 30 MG capsule TAKE 1 CAPSULE BY MOUTH EVERY DAY (Patient taking differently: Take 30 mg by mouth daily. ) 90 capsule 3  . levothyroxine (SYNTHROID,  LEVOTHROID) 50 MCG tablet Take 1 tablet (50 mcg total) by mouth daily. 90 tablet 0  . LORazepam (ATIVAN) 1 MG tablet Take 1mg  by mouth three times a day as needed for insomnia. (Patient taking differently: Take 1 mg by mouth See admin instructions. Two to three times a day as needed for palpitations or panic attacks) 90 tablet 1  . methocarbamol (ROBAXIN) 500 MG tablet Take 1 tablet (500 mg total) by mouth 2 (two) times daily. 20 tablet 0  . metoprolol succinate (TOPROL-XL) 50 MG 24 hr tablet Take 1 tablet (50 mg total) by mouth 2 (two) times daily. May take 0.5-1 tablets PRN for palpitations. (Patient taking differently: Take 50 mg by mouth See admin instructions. Two times a day and may take a third dose of 25-50 mg AS NEEDED for palpitations) 210 tablet 3  . MOVANTIK 25 MG TABS tablet Take 25 mg by mouth daily.   2  . nystatin cream (MYCOSTATIN) Apply 1 application topically 2 (two) times daily. (Patient taking differently: Apply 1 application topically 2 (two) times daily. TO AFFECTED AREA) 60 g 2  . OXYGEN Inhale 2 L into the lungs at bedtime.    . traZODone (DESYREL) 50 MG tablet Take 50 mg by mouth at bedtime.  2  . colchicine 0.6 MG tablet Take 1 tablet (0.6 mg total) by mouth daily as needed. (Patient taking differently: Take 0.6 mg by mouth daily as needed (for gout flares). ) 30 tablet 5  . naproxen (NAPROSYN) 500 MG tablet Take 1 tablet (500 mg total) by mouth 2 (two) times daily. (Patient not taking: Reported on 09/12/2016) 30 tablet 0  . traZODone (DESYREL) 100 MG tablet Take 1 tablet (100 mg total) by mouth at bedtime as needed for sleep. (Patient not taking: Reported on 09/12/2016) 30 tablet 1    Results for orders placed or performed during the hospital encounter of 09/16/16 (from the past 48 hour(s))  Glucose, capillary     Status: None   Collection Time: 09/16/16  9:03 AM  Result Value Ref Range   Glucose-Capillary 95 65 - 99 mg/dL   Comment 1 Notify RN   Type and screen [facility]     Status: None (Preliminary result)   Collection Time: 09/16/16  9:25 AM  Result Value Ref Range   ABO/RH(D) O POS    Antibody Screen PENDING    Sample Expiration 09/19/2016    No results found.  Review of Systems  HENT: Negative.   Eyes: Negative.  Respiratory: Negative.   Cardiovascular: Negative.   Gastrointestinal: Negative.   Genitourinary: Negative.   Musculoskeletal: Positive for back pain.  Skin: Negative.   Neurological: Positive for tingling, focal weakness and weakness.  Endo/Heme/Allergies: Negative.   Psychiatric/Behavioral: Negative.     Blood pressure 112/71, pulse 60, temperature 98.7 F (37.1 C), temperature source Oral, resp. rate 18, height 5\' 3"  (1.6 m), weight 73.9 kg (163 lb), last menstrual period 05/17/2009, SpO2 97 %. Physical Exam  Constitutional: She appears well-developed and well-nourished.  HENT:  Head: Normocephalic and atraumatic.  Eyes: Pupils are equal, round, and reactive to light. Conjunctivae and EOM are normal.  Neck: Normal range of motion. Neck supple.  GI: Soft. Bowel sounds are normal.  Musculoskeletal: Normal range of motion.  Neurological: She is alert.  Weakness of the tibialis anterior and the quad on the right side at 4 out of 5. Other strength is intact. Reflexes are symmetrically depressed.  Skin: Skin is warm and dry.  Psychiatric: She has a normal mood and affect. Her behavior is normal. Judgment and thought content normal.     Assessment/Plan Spondylolisthesis L3-L4. History of fusion L4 to sacrum.  Plan: Decompression and fusion L3-L4.  Earleen Newport, MD 09/16/2016, 10:39 AM

## 2016-09-16 NOTE — Progress Notes (Signed)
Orthopedic Tech Progress Note Patient Details:  [name] 1961/01/17 [id number] Patient has brace. Patient ID: [name], female   DOB: May 26, 1960, 56 y.o.   MRN: [id number]   Braulio Bosch 09/16/2016, 4:40 PM

## 2016-09-16 NOTE — Anesthesia Postprocedure Evaluation (Signed)
Anesthesia Post Note  Patient: KECHIA YAHNKE  Procedure(s) Performed: Procedure(s) (LRB): Lumbar Three- Four Posterior lumbar interbody fusion (N/A)     Patient location during evaluation: PACU Anesthesia Type: General Level of consciousness: awake and alert Pain management: pain level controlled Vital Signs Assessment: post-procedure vital signs reviewed and stable Respiratory status: spontaneous breathing, nonlabored ventilation, respiratory function stable and patient connected to nasal cannula oxygen Cardiovascular status: blood pressure returned to baseline and stable Postop Assessment: no signs of nausea or vomiting Anesthetic complications: no    Last Vitals:  Vitals:   09/16/16 1600 09/16/16 1642  BP: 107/65 122/85  Pulse: (!) 58 64  Resp: 13 18  Temp: (!) 36.3 C   SpO2: 99% 97%    Last Pain:  Vitals:   09/16/16 1610  TempSrc:   PainSc: 4                  Matyas Baisley

## 2016-09-17 MED FILL — Thrombin For Soln 5000 Unit: CUTANEOUS | Qty: 5000 | Status: AC

## 2016-09-17 MED FILL — Gelatin Absorbable MT Powder: OROMUCOSAL | Qty: 1 | Status: AC

## 2016-09-17 NOTE — Progress Notes (Signed)
Occupational Therapy Evaluation Patient Details Name: [name] MRN: [id number] DOB: 25-May-1960 Today's Date: 09/17/2016    History of Present Illness 56 yo s/p Lumbar Three- Four Posterior lumbar interbody fusion. Extensive PMH for previous back surgery x 4, fibromyalgia, CKD. See chart for further history.    Clinical Impression   Completed education regarding back precautions for ADL, IADL tasks and functional mobility for ADL. Pt would benefit from use of AE during ADL due to back precaations in addition to limitations of L knee limitations. Recommend use of 3in1 as shower seat. Pt had multiple concerns regarding sexual activity after her surgery - discussed with nsg. Pt given information regarding this topic and nsg to discuss these concerns further with pt/husband. Recommend pt discuss concerns regarding sexual activity with her surgeon. If pt has further questions regarding ADL/IADL tasks prior to DC, please page number below. Otherwise, feel pt is safe to DC home with intermittent S.     Follow Up Recommendations  No OT follow up;Supervision - Intermittent    Equipment Recommendations  3 in 1 bedside commode    Recommendations for Other Services       Precautions / Restrictions Precautions Precautions: Back Restrictions Weight Bearing Restrictions: No      Mobility Bed Mobility Overal bed mobility: Needs Assistance Bed Mobility: Sidelying to Sit   Sidelying to sit: Supervision       General bed mobility comments: vc for correct technique. Pt able to return demonstrate.  Transfers Overall transfer level: Needs assistance               General transfer comment: vc for correct technique.Pt able to return demonstrate    Balance Overall balance assessment: No apparent balance deficits (not formally assessed)                                         ADL either performed or assessed with clinical judgement   ADL Overall ADL's : Needs  assistance/impaired     Grooming: Supervision/safety;Standing   Upper Body Bathing: Supervision/ safety;Set up;Sitting   Lower Body Bathing: Minimal assistance;Sit to/from stand;Cueing for compensatory techniques;Cueing for back precautions;With adaptive equipment   Upper Body Dressing : Supervision/safety;Set up;Sitting   Lower Body Dressing: Minimal assistance;Sit to/from stand;With adaptive equipment;Cueing for compensatory techniques;Cueing for back precautions   Toilet Transfer: Supervision/safety;BSC;Ambulation (over toilet)   Toileting- Clothing Manipulation and Hygiene: Minimal assistance;Sit to/from stand   Tub/ Shower Transfer: Minimal assistance;Ambulation;Tub transfer   Functional mobility during ADLs: Supervision/safety General ADL Comments: Completed education regarding back precautions and ADL with use of AE and DME. Pt limited by L knee ROM limitations and is able to complete LB ADL wtih use of AE. Discussed useof 3in1 as tub seat. Pt verbalized understanding. Pt with questions regarding sexual activity after back surgery. Pt given booklet on alternative positions for sex after surgery but advised to not have sex until she followed up with her surgeon. Pt expressing concerns regarding her husband understanding that she does not want to have sex until she is cleared by her MD. Will discuss this with nsg. Pt asked that nursing discuss this with her husband so that he will not "make me have sex with him when I get home". Discussed home situation and if she had any concerns for her safety. Pt states her husband is not abusive but that he is not respectful of her pain.  Vision Baseline Vision/History: Wears glasses       Perception     Praxis      Pertinent Vitals/Pain Pain Assessment: 0-10 Pain Score: 4  Pain Location: back Pain Descriptors / Indicators: Aching Pain Intervention(s): Limited activity within patient's tolerance     Hand Dominance Right    Extremity/Trunk Assessment Upper Extremity Assessment Upper Extremity Assessment: Overall WFL for tasks assessed   Lower Extremity Assessment Lower Extremity Assessment: Defer to PT evaluation   Cervical / Trunk Assessment Cervical / Trunk Assessment: Other exceptions (lumbar fusion)   Communication Communication Communication: No difficulties   Cognition Arousal/Alertness: Awake/alert Behavior During Therapy: WFL for tasks assessed/performed Overall Cognitive Status: Within Functional Limits for tasks assessed                                     General Comments       Exercises     Shoulder Instructions      Home Living Family/patient expects to be discharged to:: Private residence   Available Help at Discharge: Family;Friend(s);Available 24 hours/day Type of Home: House Home Access: Stairs to enter [employer] of Steps: 7 Entrance Stairs-Rails: Right;Left Home Layout: One level     Bathroom Shower/Tub: Tub only   Biochemist, clinical: Standard Bathroom Accessibility: Yes How Accessible: Accessible via walker Home Equipment: [place] - single point          Prior Functioning/Environment Level of Independence: Independent                 OT Problem List: Decreased knowledge of use of DME or AE;Decreased knowledge of precautions;Pain      OT Treatment/Interventions: Self-care/ADL training;DME and/or AE instruction;Therapeutic activities;Patient/family education    OT Goals(Current goals can be found in the care plan section) Acute Rehab OT Goals Patient Stated Goal: to go home OT Goal Formulation: With patient Time For Goal Achievement: 10/01/16 Potential to Achieve Goals: Good  OT Frequency: Min 2X/week   Barriers to D/C:            Co-evaluation              AM-PAC PT "6 Clicks" Daily Activity     Outcome Measure Help from another person eating meals?: None Help from another person taking care of personal  grooming?: None Help from another person toileting, which includes using toliet, bedpan, or urinal?: A Little Help from another person bathing (including washing, rinsing, drying)?: A Little Help from another person to put on and taking off regular upper body clothing?: None Help from another person to put on and taking off regular lower body clothing?: A Little 6 Click Score: 21   End of Session Equipment Utilized During Treatment: Back brace Nurse Communication: Other (comment) (DC conerns)  Activity Tolerance: Patient tolerated treatment well Patient left: in chair;with call bell/phone within reach  OT Visit Diagnosis: Pain;Muscle weakness (generalized) (M62.81) Pain - part of body:  (back)                Time: 0254-2706 OT Time Calculation (min): 40 min Charges:  OT General Charges $OT Visit: 1 Visit OT Evaluation $OT Eval Moderate Complexity: 1 Mod OT Treatments $Self Care/Home Management : 23-37 mins G-Codes:     Hektor Huston, OT/L  [phone] 09/17/2016  Peggie Hornak,HILLARY 09/17/2016, 10:31 AM

## 2016-09-17 NOTE — Evaluation (Signed)
Physical Therapy Evaluation and Discharge Patient Details Name: [name] MRN: [id number] DOB: [birth year] Today's Date: 09/17/2016   History of Present Illness  Lumbar Three- Four Posterior lumbar interbody fusion   Clinical Impression  Patient is s/p above surgery resulting in the deficits listed below (see PT Problem List). Pt functioning at modI/supervision level of assist. Pt able to recall back precautions. Pt with no further acute PT needs at this time. PT SIGNING off. Please re-consult if needed in future.    Follow Up Recommendations No PT follow up;Supervision - Intermittent    Equipment Recommendations  None recommended by PT    Recommendations for Other Services       Precautions / Restrictions Precautions Precautions: Back Precaution Booklet Issued: Yes (comment) Precaution Comments: pt able to recall precautions but requires reminders to hear functionally, able to catch self in moment Required Braces or Orthoses: Spinal Brace Spinal Brace: Lumbar corset;Applied in sitting position (pt able to don/doff indep) Restrictions Weight Bearing Restrictions: No      Mobility  Bed Mobility Overal bed mobility: Needs Assistance Bed Mobility: Sidelying to Sit;Sit to Sidelying   Sidelying to sit: Modified independent (Device/Increase time)     Sit to sidelying: Modified independent (Device/Increase time) General bed mobility comments: pt with good technique, increased time  Transfers Overall transfer level: Needs assistance   Transfers: Sit to/from Stand Sit to Stand: Modified independent (Device/Increase time)         General transfer comment: pt with good technique  Ambulation/Gait Ambulation/Gait assistance: Modified independent (Device/Increase time) Ambulation Distance (Feet): 200 Feet Assistive device: None Gait Pattern/deviations: Step-through pattern;Wide base of support;Decreased stride length Gait velocity: guarded Gait velocity interpretation:  Below normal speed for age/gender General Gait Details: no episodes of LOB, guarded and cautious, educated to contract abdominal muscles to help take pressure off back  Stairs Stairs: Yes Stairs assistance: Supervision Stair Management: Two rails;Alternating pattern Number of Stairs: 12 General stair comments: good technique, no difficulty, educated on going up with strong leg and down with weaker leg  Wheelchair Mobility    Modified Rankin (Stroke Patients Only)       Balance Overall balance assessment: No apparent balance deficits (not formally assessed)                                           Pertinent Vitals/Pain Pain Assessment: 0-10 Pain Score: 8  Pain Location: back Pain Descriptors / Indicators: Aching Pain Intervention(s): Monitored during session    Home Living Family/patient expects to be discharged to:: Private residence Living Arrangements: Spouse/significant other;Children Available Help at Discharge: Family;Friend(s);Available 24 hours/day Type of Home: House Home Access: Stairs to enter Entrance Stairs-Rails: Psychiatric nurse of Steps: 7 Home Layout: One level Home Equipment: Cane - single point      Prior Function Level of Independence: Independent               Hand Dominance   Dominant Hand: Right    Extremity/Trunk Assessment   Upper Extremity Assessment Upper Extremity Assessment: Overall WFL for tasks assessed    Lower Extremity Assessment Lower Extremity Assessment: RLE deficits/detail RLE Deficits / Details: pt reports mild burning and grossly 4/5    Cervical / Trunk Assessment Cervical / Trunk Assessment: Other exceptions (lumbar fusion)  Communication   Communication: No difficulties  Cognition Arousal/Alertness: Awake/alert Behavior During Therapy: WFL for tasks assessed/performed Overall  Cognitive Status: Within Functional Limits for tasks assessed                                         General Comments      Exercises     Assessment/Plan    PT Assessment Patent does not need any further PT services  PT Problem List         PT Treatment Interventions      PT Goals (Current goals can be found in the Care Plan section)  Acute Rehab PT Goals Patient Stated Goal: to go home PT Goal Formulation: All assessment and education complete, DC therapy    Frequency     Barriers to discharge        Co-evaluation               AM-PAC PT "6 Clicks" Daily Activity  Outcome Measure Difficulty turning over in bed (including adjusting bedclothes, sheets and blankets)?: None Difficulty moving from lying on back to sitting on the side of the bed? : None Difficulty sitting down on and standing up from a chair with arms (e.g., wheelchair, bedside commode, etc,.)?: None Help needed moving to and from a bed to chair (including a wheelchair)?: None Help needed walking in hospital room?: A Little Help needed climbing 3-5 steps with a railing? : A Little 6 Click Score: 22    End of Session Equipment Utilized During Treatment: Gait belt;Back brace Activity Tolerance: Patient tolerated treatment well Patient left: in bed;with call bell/phone within reach Nurse Communication: Patient requests pain meds;Mobility status PT Visit Diagnosis: Difficulty in walking, not elsewhere classified (R26.2)    Time: 2409-7353 PT Time Calculation (min) (ACUTE ONLY): 20 min   Charges:   PT Evaluation $PT Eval Moderate Complexity: 1 Mod     PT G Codes:        Kittie Plater, PT, DPT Pager #: [phone] Office #: [phone]   [place] 09/17/2016, 12:04 PM

## 2016-09-17 NOTE — Progress Notes (Signed)
Patient ID: [name], female   DOB: 1960-04-01, 56 y.o.   MRN: [id number] Vital signs are stable Motor function appears intact and the lower extremities Patient complains of moderate soreness Mobilizing fairly well with coarse.

## 2016-09-18 MED ORDER — METHOCARBAMOL 500 MG PO TABS: 500.0000 mg | ORAL_TABLET | Freq: Four times a day (QID) | ORAL | 3 refills | Status: DC | PRN

## 2016-09-18 MED ORDER — HYDROCODONE-ACETAMINOPHEN 5-325 MG PO TABS: 1.0000 | ORAL_TABLET | ORAL | 0 refills | Status: DC | PRN

## 2016-09-18 MED FILL — Heparin Sodium (Porcine) Inj 1000 Unit/ML: INTRAMUSCULAR | Qty: 30 | Status: AC

## 2016-09-18 MED FILL — Sodium Chloride IV Soln 0.9%: INTRAVENOUS | Qty: 1000 | Status: AC

## 2016-09-18 NOTE — Progress Notes (Signed)
Pt doing well. Pt and husband given D/C instructions with Rx's, verbal understanding was provided. Pt's incision is clean and dry with no sign of infection. Pt's IV was removed prior to D/C. Pt D/C'd home via wheelchair @ 1020 per MD order. Pt is stable @ D/C and has no other needs at this time. Holli Humbles, RN

## 2016-09-18 NOTE — Op Note (Signed)
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 Pedicle screw fixation L3-L4 with removal of old hardware from L4-L5. Posterior lateral arthrodesis with local autograft and allograft. Surgeon: Kristeen Miss First assistant: [place] Bailiff M.D. Anesthesia: Gen. endotracheal Indications: [name] is a 56 year old individual is had significant back and bilateral lower extremity pain. She has evidence of marked spondylitic stenosis at L3-4. She is advised regarding the need for surgery. She's had previous decompression and fusion from L4 to the sacrum.  Procedure: Patient was brought to the operating room supine on a stretcher. After the smooth induction of general endotracheal anesthesia, she was turned prone. The back was prepped with alcohol and DuraPrep and draped in a sterile fashion. Midline incision was created and carried down to the lumbar dorsal fascia. The old hardware was exposed at L4-L5 this giving positive localization. The hardware was loosened and removed. Then the L3-4 area was explored and the large laminectomy removing the inferior margin lamina out to and including the entirety of the facet at L3-L4 was performed this was done bilaterally. Common dural tube was dissected free and the L3 nerve root superiorly and the L4 nerve root inferiorly were dissected in carefully protected. The disc space was entered. A substantial quantity of severely degenerated desiccated disc material was removed. Complete discectomy was performed and the endplates were decorticated thoroughly. Then the interspace was sized for appropriate size interbody spacer. This was felt to be an 8 lordotic 10 mm tall spacer measuring 23 mm in length. With the spacers placed  at interspace was packed with 6 mL of autograft and allograft mixture. Lateral gutters were decorticated and these were also packed with bone graft. Pedicle entry sites were chosen at L3 and 6.5 x 45 mm screws were placed in L3 and L4. Recontoured 74mm rods were placed between the screw heads and tightened in the neutral construct. Final radiographs were obtained. The retractor was removed hemostasis was doubly checked and was checked at the L3 and L4 nerve roots were well decompressed. Hemostasis was well established. Lumbar dorsal fascia was closed with #1 Vicryl 2-0 Vicryl was used in subcutaneous tissues and 3-0 Vicryl subcuticularly. Dermabond was placed on the skin. Blood loss is estimated at less than 200 mL.

## 2016-09-18 NOTE — Discharge Instructions (Signed)

## 2016-09-18 NOTE — Discharge Summary (Signed)
Date of admission: 09/16/2016 Date of discharge: 09/18/2016 Admitting diagnosis: Lumbar spondylolisthesis L3-L4 with neurogenic claudication, lumbar radiculopathy Discharge diagnoses: Lumbar spondylolisthesis L3-L4 with neurogenic claudication, lumbar radiculopathy, history of fusion L4 to sacrum. Condition on discharge: [place] Hospital course: Patient was admitted to undergo surgical decompression arthrodesis which she tolerated well. Pain management has been adequate with hydrocodone. She is discharged home on this medication addition to Robaxin. Incision is clean and dry. Station and gait are intact.

## 2016-09-19 LAB — BPAM RBC
BLOOD PRODUCT EXPIRATION DATE: 201809252359
Blood Product Expiration Date: 201809252359
Unit Type and Rh: 5100
Unit Type and Rh: 5100

## 2016-09-19 LAB — TYPE AND SCREEN
ABO/RH(D): O POS
Antibody Screen: NEGATIVE
UNIT DIVISION: 0
Unit division: 0

## 2016-09-24 ENCOUNTER — Other Ambulatory Visit (HOSPITAL_COMMUNITY): Payer: Self-pay | Admitting: Psychiatry

## 2016-09-24 DIAGNOSIS — G4701 Insomnia due to medical condition: Secondary | ICD-10-CM

## 2016-09-26 ENCOUNTER — Other Ambulatory Visit: Payer: Self-pay | Admitting: *Deleted

## 2016-09-26 MED ORDER — LEVOTHYROXINE SODIUM 50 MCG PO TABS: 50.0000 ug | ORAL_TABLET | Freq: Every day | ORAL | 3 refills | Status: DC

## 2016-10-03 ENCOUNTER — Other Ambulatory Visit (HOSPITAL_COMMUNITY): Payer: Self-pay

## 2016-10-03 DIAGNOSIS — G4701 Insomnia due to medical condition: Secondary | ICD-10-CM

## 2016-10-03 MED ORDER — TRAZODONE HCL 100 MG PO TABS: 100.0000 mg | ORAL_TABLET | Freq: Every evening | ORAL | 0 refills | Status: DC | PRN

## 2016-10-24 ENCOUNTER — Ambulatory Visit (HOSPITAL_COMMUNITY): Payer: Self-pay | Admitting: Psychiatry

## 2016-10-24 ENCOUNTER — Other Ambulatory Visit (HOSPITAL_COMMUNITY): Payer: Self-pay

## 2016-10-24 DIAGNOSIS — F411 Generalized anxiety disorder: Secondary | ICD-10-CM

## 2016-10-24 DIAGNOSIS — G4701 Insomnia due to medical condition: Secondary | ICD-10-CM

## 2016-10-24 MED ORDER — TRAZODONE HCL 100 MG PO TABS: 100.0000 mg | ORAL_TABLET | Freq: Every evening | ORAL | 0 refills | Status: DC | PRN

## 2016-10-24 MED ORDER — LORAZEPAM 1 MG PO TABS: ORAL_TABLET | ORAL | 0 refills | Status: DC

## 2016-11-17 ENCOUNTER — Other Ambulatory Visit (HOSPITAL_COMMUNITY): Payer: Self-pay | Admitting: Psychiatry

## 2016-11-17 DIAGNOSIS — G4701 Insomnia due to medical condition: Secondary | ICD-10-CM

## 2016-11-25 ENCOUNTER — Other Ambulatory Visit (HOSPITAL_COMMUNITY): Payer: Self-pay

## 2016-11-25 DIAGNOSIS — G4701 Insomnia due to medical condition: Secondary | ICD-10-CM

## 2016-11-25 MED ORDER — TRAZODONE HCL 100 MG PO TABS: 100.0000 mg | ORAL_TABLET | Freq: Every evening | ORAL | 0 refills | Status: DC | PRN

## 2016-11-28 ENCOUNTER — Encounter (HOSPITAL_COMMUNITY): Payer: Self-pay | Admitting: Psychiatry

## 2016-11-28 ENCOUNTER — Ambulatory Visit (INDEPENDENT_AMBULATORY_CARE_PROVIDER_SITE_OTHER): Payer: Medicare Other | Admitting: Psychiatry

## 2016-11-28 DIAGNOSIS — F411 Generalized anxiety disorder: Secondary | ICD-10-CM | POA: Diagnosis not present

## 2016-11-28 DIAGNOSIS — G4701 Insomnia due to medical condition: Secondary | ICD-10-CM

## 2016-11-28 DIAGNOSIS — F17210 Nicotine dependence, cigarettes, uncomplicated: Secondary | ICD-10-CM | POA: Diagnosis not present

## 2016-11-28 MED ORDER — TRAZODONE HCL 100 MG PO TABS: 100.0000 mg | ORAL_TABLET | Freq: Every evening | ORAL | 2 refills | Status: DC | PRN

## 2016-11-28 MED ORDER — LORAZEPAM 1 MG PO TABS: ORAL_TABLET | ORAL | 2 refills | Status: DC
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
## 2017-01-23 DIAGNOSIS — Z6832 Body mass index (BMI) 32.0-32.9, adult: Secondary | ICD-10-CM | POA: Diagnosis not present

## 2017-01-23 DIAGNOSIS — M4316 Spondylolisthesis, lumbar region: Secondary | ICD-10-CM | POA: Diagnosis not present

## 2017-03-06 ENCOUNTER — Encounter (HOSPITAL_COMMUNITY): Payer: Self-pay | Admitting: Psychiatry

## 2017-03-06 ENCOUNTER — Ambulatory Visit (INDEPENDENT_AMBULATORY_CARE_PROVIDER_SITE_OTHER): Payer: No Typology Code available for payment source | Admitting: Psychiatry

## 2017-03-06 VITALS — BP 130/74 | HR 78 | Ht 64.0 in | Wt 186.0 lb

## 2017-03-06 DIAGNOSIS — M549 Dorsalgia, unspecified: Secondary | ICD-10-CM | POA: Diagnosis not present

## 2017-03-06 DIAGNOSIS — G4701 Insomnia due to medical condition: Secondary | ICD-10-CM | POA: Diagnosis not present

## 2017-03-06 DIAGNOSIS — F411 Generalized anxiety disorder: Secondary | ICD-10-CM | POA: Diagnosis not present

## 2017-03-06 DIAGNOSIS — M2550 Pain in unspecified joint: Secondary | ICD-10-CM | POA: Diagnosis not present

## 2017-03-06 DIAGNOSIS — F17210 Nicotine dependence, cigarettes, uncomplicated: Secondary | ICD-10-CM

## 2017-03-06 DIAGNOSIS — Z560 Unemployment, unspecified: Secondary | ICD-10-CM | POA: Diagnosis not present

## 2017-03-06 MED ORDER — LORAZEPAM 1 MG PO TABS: ORAL_TABLET | ORAL | 2 refills | Status: DC

## 2017-03-06 MED ORDER — TRAZODONE HCL 100 MG PO TABS: 100.0000 mg | ORAL_TABLET | Freq: Every evening | ORAL | 2 refills | Status: DC | PRN
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
## 2017-03-08 ENCOUNTER — Other Ambulatory Visit: Payer: Self-pay | Admitting: Cardiology

## 2017-03-08 DIAGNOSIS — I10 Essential (primary) hypertension: Secondary | ICD-10-CM

## 2017-03-08 DIAGNOSIS — R002 Palpitations: Secondary | ICD-10-CM

## 2017-03-10 NOTE — Telephone Encounter (Signed)
Follow up      *STAT* If patient is at the pharmacy, call can be transferred to refill team.   1. Which medications need to be refilled? (please list name of each medication and dose if known)metoprolol succinate (TOPROL-XL) 50 MG 24 hr tablet  2. Which pharmacy/location (including street and city if local pharmacy) is medication to be sent to? [facility]  [address]  3. Do they need a 30 day or 90 day supply? [place]

## 2017-03-12 ENCOUNTER — Other Ambulatory Visit: Payer: Self-pay

## 2017-03-12 MED ORDER — ATORVASTATIN CALCIUM 20 MG PO TABS: 20.0000 mg | ORAL_TABLET | Freq: Every day | ORAL | 3 refills | Status: DC

## 2017-03-14 ENCOUNTER — Ambulatory Visit (INDEPENDENT_AMBULATORY_CARE_PROVIDER_SITE_OTHER): Payer: No Typology Code available for payment source | Admitting: Cardiology

## 2017-03-14 ENCOUNTER — Encounter: Payer: Self-pay | Admitting: Cardiology

## 2017-03-14 VITALS — BP 98/66 | HR 78 | Ht 63.5 in | Wt 186.4 lb

## 2017-03-14 DIAGNOSIS — I10 Essential (primary) hypertension: Secondary | ICD-10-CM | POA: Diagnosis not present

## 2017-03-14 DIAGNOSIS — R079 Chest pain, unspecified: Secondary | ICD-10-CM

## 2017-03-14 DIAGNOSIS — R002 Palpitations: Secondary | ICD-10-CM | POA: Diagnosis not present

## 2017-03-14 MED ORDER — METOPROLOL TARTRATE 25 MG PO TABS
ORAL_TABLET | ORAL | 3 refills | Status: DC
Start: 2017-03-14 — End: 2018-01-12

## 2017-03-14 MED ORDER — METOPROLOL SUCCINATE ER 50 MG PO TB24: ORAL_TABLET | ORAL | 1 refills | Status: DC

## 2017-03-14 NOTE — Patient Instructions (Signed)
Medication Instructions:  USE BENADRYL AT NIGHT FOR ALLERGIES AND TO HELP YOU SLEEP   START TAKING METOPROLOL SUC 50 MG 2 TABLETS AT NIGHT  START USING METOPROLOL TART 25 MG AS NEEDED FOR BREAKTHROUGH IRREGULAR HEARTBEATS/PALPITATIONS   Labwork: NONE  Testing/Procedures: NONE  Follow-Up: Your physician recommends that you schedule a follow-up appointment in: [place]   If you need a refill on your cardiac medications before your next appointment, please call your pharmacy.
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
## 2017-03-17 ENCOUNTER — Encounter: Payer: Self-pay | Admitting: Cardiology

## 2017-03-17 NOTE — Assessment & Plan Note (Addendum)
She has somewhat atypical sounding chest discomfort which sounds probably consistent with musculoskeletal costochondritis, but she is very determined to exclude CAD.  This is the same pain that she was having when she had a Myoview back in 2017.  Am not convinced that her symptoms are cardiac, therefore would not recommend further testing at this point.

## 2017-03-17 NOTE — Assessment & Plan Note (Signed)
She is now thinking of essentially 150 mg a day of Toprol.  I did not intend for her to take as much as she is doing.  I do think that treating her anxiety symptoms will help, and she is still not on an SSRI.  I have spent 30-35 minutes out of the 45 minutes spent with her talking about how to manage her palpitations. Plan: I am having her take 100 mg of Toprol in the evening, none in the morning and only use as needed metoprolol tartrate (a new prescription) for as needed palpitations in the afternoon.  I want to minimize how much of the long-acting Toprol she is taking. She is stay adequately hydrated.  Her PCP has put her on Lorazepam which is not listed, and that seems to have kept things under control, but I think she would benefit from true SSRI medication.

## 2017-03-21 ENCOUNTER — Telehealth: Payer: Self-pay | Admitting: Cardiology

## 2017-03-21 NOTE — Telephone Encounter (Signed)
New Message:     Pt said since she changed the way she is taking her Metoprolol,her blood pressure is going up.It is averaging 130/100.,Please call to advise.

## 2017-03-21 NOTE — Telephone Encounter (Signed)
Spoke to patient.she states blood pressure is ranging 120-130/100 midday since she statred taking metoprolol xl   (2) 50mg  tablet ( total of 100 mg) at bedtime. She states she still having palp, "heart aches" at times. Informed patient to continue taking medications . Will defer to Dr Ellyn Hack.

## 2017-03-22 NOTE — Telephone Encounter (Signed)
These blood pressure readings are okay.  Heart palpitations are not dangerous, but if they are bothering her, that is the reason why I prescribed the short acting metoprolol for her to take. The intention was to minimize how much of the long-acting medication she was taking.  Depending on how much of the as needed short acting medicine she takes, we may increase the nighttime dose.  Glenetta Hew, MD

## 2017-03-25 DIAGNOSIS — M961 Postlaminectomy syndrome, not elsewhere classified: Secondary | ICD-10-CM | POA: Diagnosis not present

## 2017-03-27 NOTE — Telephone Encounter (Signed)
SPOKE TO PATIENT , PER DR HARDING ,TAKE 100 MG SUCCINATE AT BEDTIME , IF NEEDED MAY TAKE SHORT ACTING  METORPOLOL DURING THE DAY.   PATIENT STATES SHE IS TAKING 50 MG TOPROL XL TWICE A DAY AT 8 AND 8. SHE STATES SHE IS NOT TAKING THE AFTERNOON DOSE.   SHE VERBALIZED UNDERSTANDING.,BUT WIL CONTINUE AS ABOVE -"SHE FEELS BETTER AND BLOOD PRESSURE IS GOOD"

## 2017-04-17 DIAGNOSIS — M545 Low back pain: Secondary | ICD-10-CM | POA: Diagnosis not present

## 2017-04-17 DIAGNOSIS — M961 Postlaminectomy syndrome, not elsewhere classified: Secondary | ICD-10-CM | POA: Diagnosis not present

## 2017-05-14 DIAGNOSIS — M961 Postlaminectomy syndrome, not elsewhere classified: Secondary | ICD-10-CM | POA: Diagnosis not present

## 2017-05-14 DIAGNOSIS — Z6833 Body mass index (BMI) 33.0-33.9, adult: Secondary | ICD-10-CM | POA: Diagnosis not present

## 2017-06-05 ENCOUNTER — Encounter (HOSPITAL_COMMUNITY): Payer: Self-pay | Admitting: Psychiatry

## 2017-06-05 ENCOUNTER — Ambulatory Visit (INDEPENDENT_AMBULATORY_CARE_PROVIDER_SITE_OTHER): Payer: No Typology Code available for payment source | Admitting: Psychiatry

## 2017-06-05 DIAGNOSIS — F411 Generalized anxiety disorder: Secondary | ICD-10-CM | POA: Diagnosis not present

## 2017-06-05 DIAGNOSIS — Z560 Unemployment, unspecified: Secondary | ICD-10-CM

## 2017-06-05 DIAGNOSIS — F17210 Nicotine dependence, cigarettes, uncomplicated: Secondary | ICD-10-CM | POA: Diagnosis not present

## 2017-06-05 DIAGNOSIS — G4701 Insomnia due to medical condition: Secondary | ICD-10-CM

## 2017-06-05 MED ORDER — LORAZEPAM 1 MG PO TABS: ORAL_TABLET | ORAL | 2 refills | Status: DC

## 2017-06-05 MED ORDER — TRAZODONE HCL 50 MG PO TABS
50.0000 mg | ORAL_TABLET | Freq: Every evening | ORAL | 1 refills | Status: DC | PRN
Start: 2017-06-05 — End: 2017-08-03
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
## 2017-07-04 ENCOUNTER — Other Ambulatory Visit: Payer: Self-pay | Admitting: Internal Medicine

## 2017-08-03 ENCOUNTER — Other Ambulatory Visit (HOSPITAL_COMMUNITY): Payer: Self-pay | Admitting: Psychiatry

## 2017-08-03 DIAGNOSIS — G4701 Insomnia due to medical condition: Secondary | ICD-10-CM

## 2017-08-06 DIAGNOSIS — Z6835 Body mass index (BMI) 35.0-35.9, adult: Secondary | ICD-10-CM | POA: Diagnosis not present

## 2017-08-06 DIAGNOSIS — M961 Postlaminectomy syndrome, not elsewhere classified: Secondary | ICD-10-CM | POA: Diagnosis not present

## 2017-08-06 DIAGNOSIS — M4316 Spondylolisthesis, lumbar region: Secondary | ICD-10-CM | POA: Diagnosis not present

## 2017-08-20 ENCOUNTER — Other Ambulatory Visit: Payer: Self-pay | Admitting: Internal Medicine

## 2017-08-20 DIAGNOSIS — K219 Gastro-esophageal reflux disease without esophagitis: Secondary | ICD-10-CM

## 2017-08-21 DIAGNOSIS — Z6835 Body mass index (BMI) 35.0-35.9, adult: Secondary | ICD-10-CM | POA: Diagnosis not present

## 2017-08-21 DIAGNOSIS — M961 Postlaminectomy syndrome, not elsewhere classified: Secondary | ICD-10-CM | POA: Diagnosis not present

## 2017-09-03 ENCOUNTER — Other Ambulatory Visit (HOSPITAL_COMMUNITY): Payer: Self-pay | Admitting: Psychiatry

## 2017-09-03 DIAGNOSIS — G4701 Insomnia due to medical condition: Secondary | ICD-10-CM

## 2017-09-04 ENCOUNTER — Ambulatory Visit (HOSPITAL_COMMUNITY): Payer: Self-pay | Admitting: Psychiatry

## 2017-09-07 ENCOUNTER — Other Ambulatory Visit: Payer: Self-pay | Admitting: Family Medicine

## 2017-09-07 DIAGNOSIS — L304 Erythema intertrigo: Secondary | ICD-10-CM

## 2017-09-25 ENCOUNTER — Ambulatory Visit (HOSPITAL_COMMUNITY): Payer: Self-pay | Admitting: Psychiatry

## 2017-10-05 IMAGING — DX DG CHEST 2V
2 series · 2 of 2 positions shown · non-contrast
Comparison: 05/27/2012

CLINICAL DATA: Central chest pain with shortness of breath, cough,
emesis, and diaphoresis. Onset this evening. Symptoms present for 3
weeks but now worsening.

EXAM:
CHEST  2 VIEW

[chest pa]
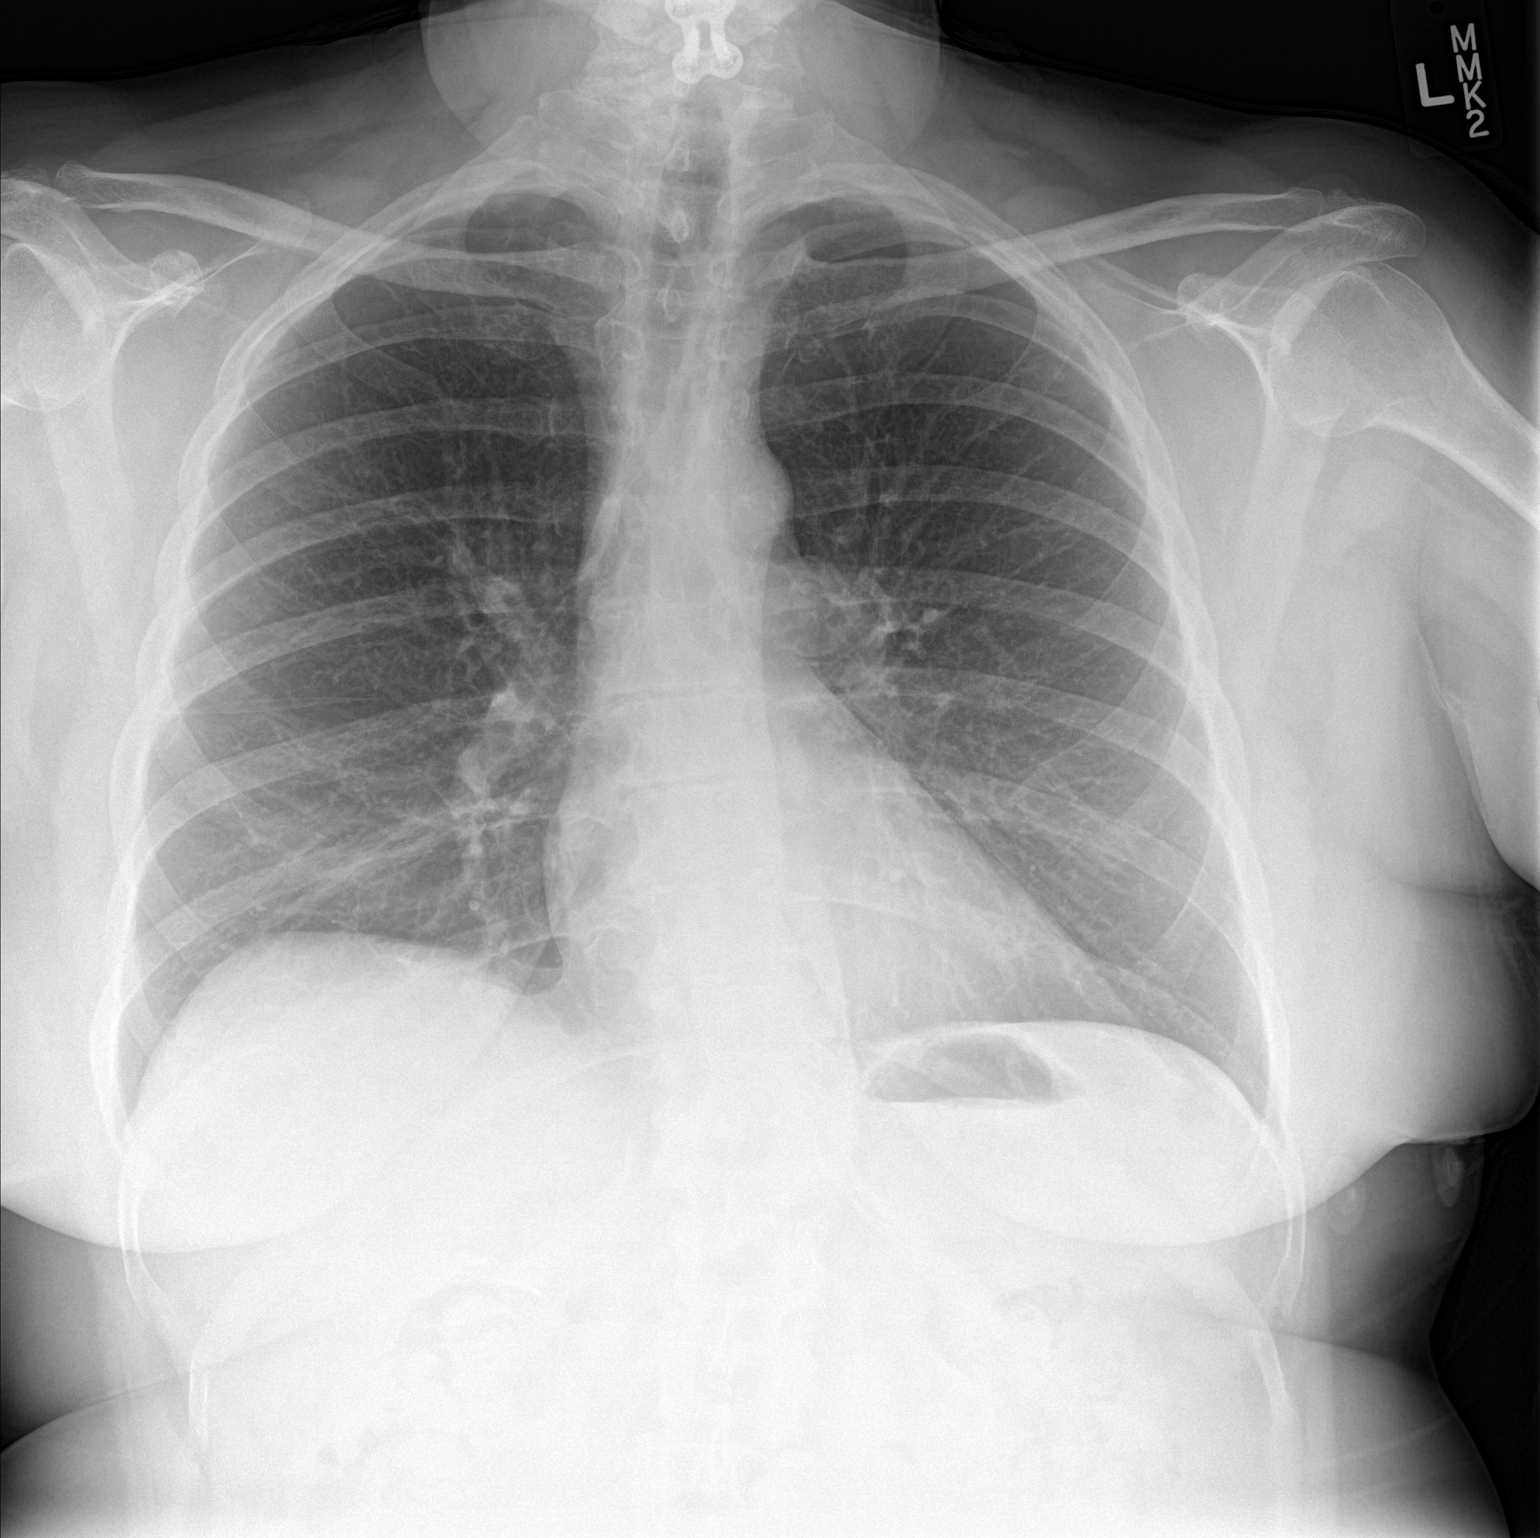

[chest lat]
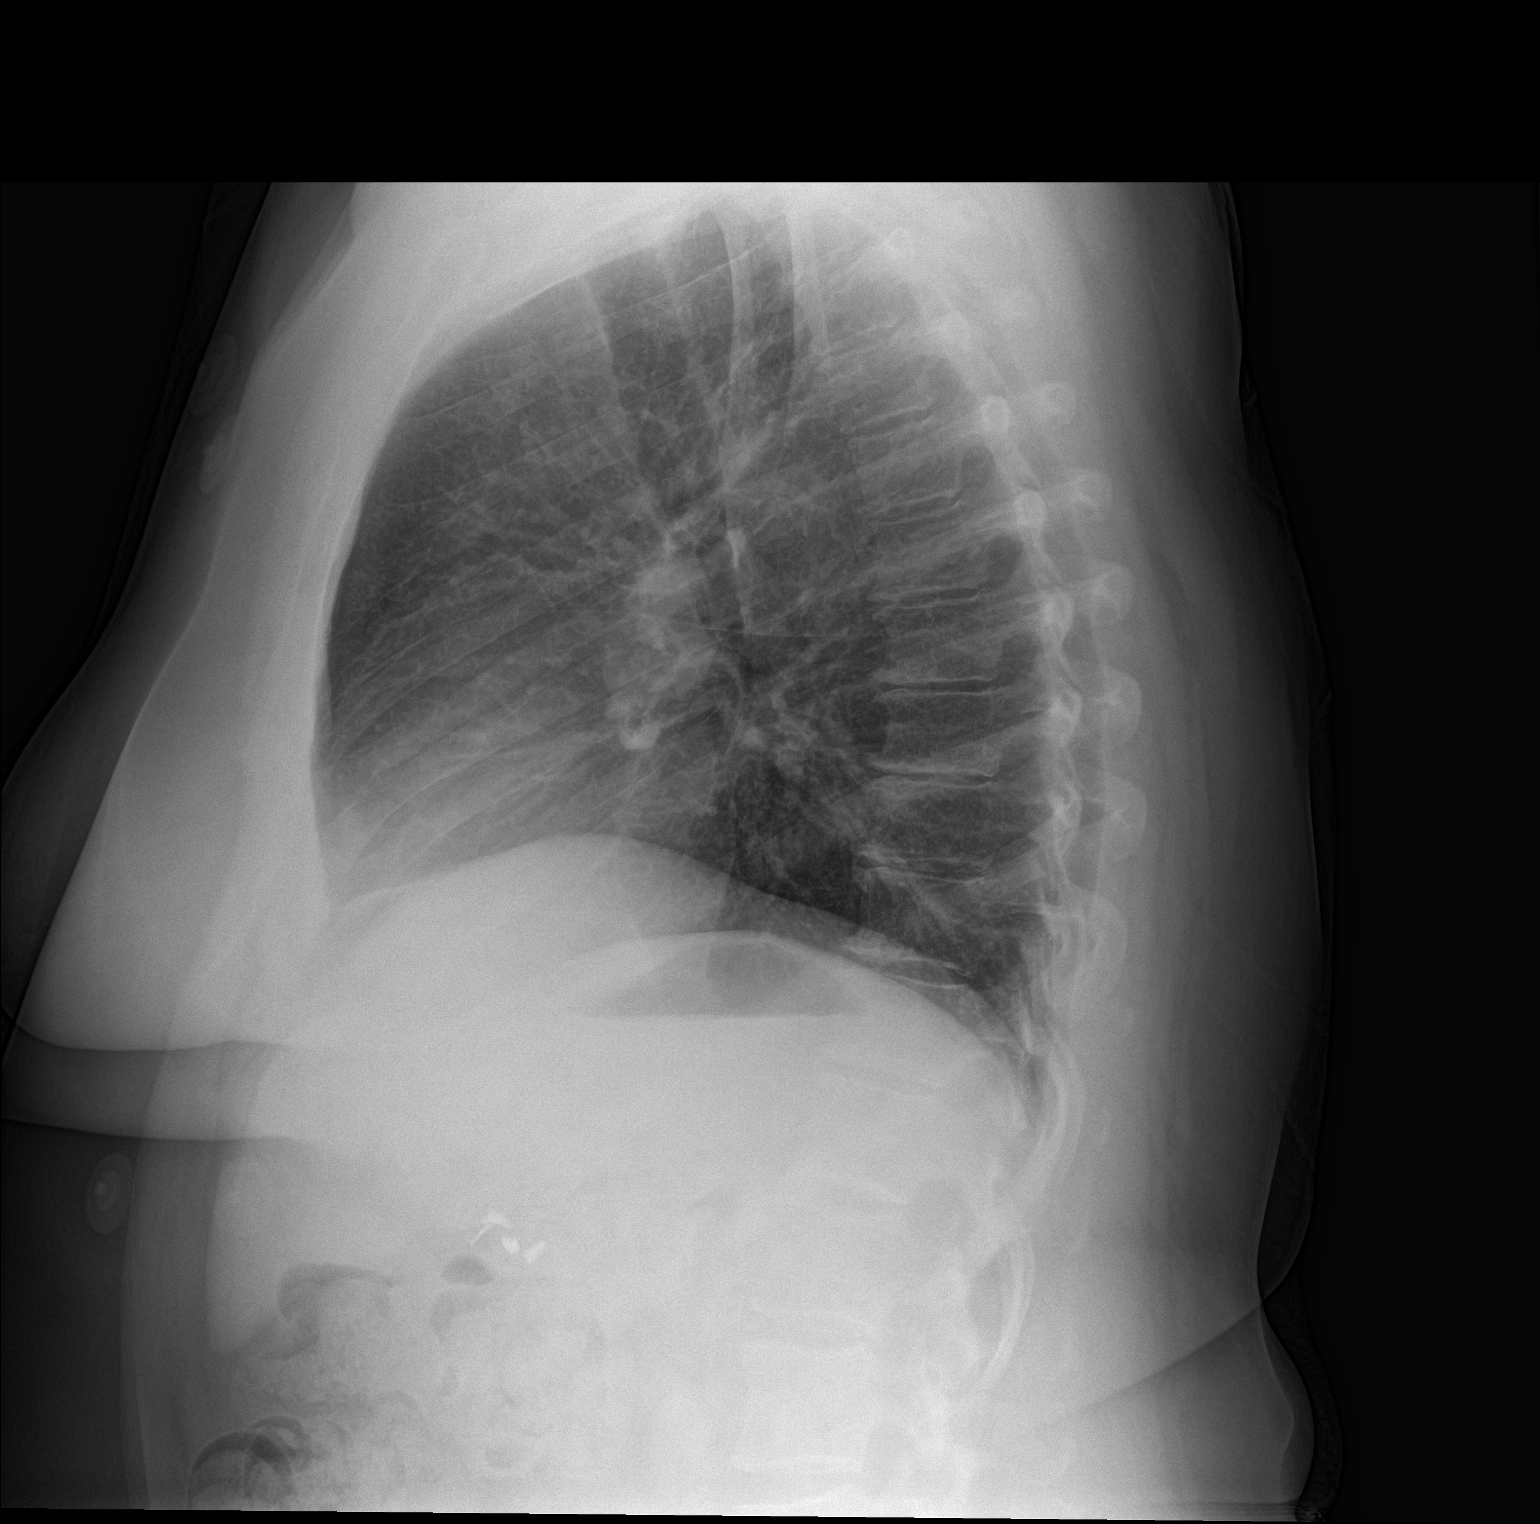

[2 of 2 positions shown; findings below may reference images not displayed]

FINDINGS: Normal heart size and pulmonary vascularity. No focal airspace
disease or consolidation in the lungs. No blunting of costophrenic
angles. No pneumothorax. Mediastinal contours appear intact.
Postoperative changes in the cervical spine and right upper
quadrant. Mild linear scarring in the lung bases. Degenerative
changes in the spine.
IMPRESSION: No active cardiopulmonary disease.

## 2017-10-10 ENCOUNTER — Other Ambulatory Visit: Payer: Self-pay | Admitting: Internal Medicine

## 2017-10-11 NOTE — Telephone Encounter (Signed)
Will refill for 1 month. Please have patient schedule yearly appointment for further refills as it has been over a year since she has been seen.

## 2017-10-13 NOTE — Telephone Encounter (Signed)
Pt informed and scheduled an appt. Arnoldo Hildreth Kennon Holter, CMA

## 2017-10-18 ENCOUNTER — Other Ambulatory Visit (HOSPITAL_COMMUNITY): Payer: Self-pay | Admitting: Psychiatry

## 2017-10-18 DIAGNOSIS — F411 Generalized anxiety disorder: Secondary | ICD-10-CM

## 2017-10-18 DIAGNOSIS — G4701 Insomnia due to medical condition: Secondary | ICD-10-CM

## 2017-10-20 NOTE — Telephone Encounter (Signed)
Patient called back requesting a refill. She cancelled her 09-25-17 appointment. No future appointment is scheduled.

## 2017-10-27 DIAGNOSIS — M4316 Spondylolisthesis, lumbar region: Secondary | ICD-10-CM | POA: Diagnosis not present

## 2017-10-27 DIAGNOSIS — Z6835 Body mass index (BMI) 35.0-35.9, adult: Secondary | ICD-10-CM | POA: Diagnosis not present

## 2017-10-27 DIAGNOSIS — M961 Postlaminectomy syndrome, not elsewhere classified: Secondary | ICD-10-CM | POA: Diagnosis not present

## 2017-11-04 NOTE — Progress Notes (Signed)
Subjective:   Patient ID: [name]    DOB: Aug 15, 1960, 57 y.o. female   MRN: [id number]  [name] is a 57 y.o. female with a history of hypothyroidism, prediabetes, HTN, HLD, and CKD stage 3 here for yearly physical and med refills.  Concerns for today:  Insomnia: Has trouble falling asleep and staying asleep. Has history of staying awake for several days. Every other day cant sleep for >24 hours. Denies impulsive behaviors though.Takes Trazodone 50mg  and Gabapentin. Has tried benadryl in the past. Notes she gets up a lot to pee. Seeing urologist on 11/4 for further evaluation. Notes being very thirsty for years. Has had insomnia for years. But appears to be worse lately. May wake up often, but doesn't have trouble going back to sleep. No naps during the day. Patient uses 2L O2 at night. The noise makes sleeping worse. Reports waking up in sweats. Notes she does not drink coffee or sodas, no tv in the room. Does stay up late watching TV in the living room.   Prediabetes Patient told in past she has prediabetes. Has never been on medication before.  Last AIC in August 2018 was 5.9.  Hypothyroidism Patient with chronic history of this. Currently on Levothyroixine 41mcg daily. Last TSH WNL in August 2018. Denies any hypo- or hyper-thyroid symptoms.   HTN: BP today: 114/68. Followed by cardiologist Dr. Ellyn Hack. Currently takes Metoprolol XL (Toprol) 50mg  BID with occasional Metoprolol (Lopressor) when experiences chest pain. No workup has been completed for said chest pain as it has been associated with anxiety. She was previously prescribed Ativan to help with this.  HLD: Last lipid panel in 2017. Currently taking Atorvastatin 20mg  QD. Denies any symptoms.  Health Maintenance: Due for flu, mammogram, colonoscopy/fobt, papsmear, HIV  Review of Systems:  Per HPI.   [place], medications and smoking status reviewed.  Objective:   BP 114/68   Pulse 60   Temp 98.2 F (36.8 C)  (Oral)   Ht 5' 3.5" (1.613 m)   Wt 212 lb 3.2 oz (96.3 kg)   LMP 05/17/2009 (LMP Unknown)   SpO2 96%   BMI 37.00 kg/m  Vitals and nursing note reviewed.  General: well nourished, well developed, in no acute distress with non-toxic appearance HEENT: normocephalic, atraumatic, moist mucous membranes Neck: supple, non-tender without lymphadenopathy CV: regular rate and rhythm without murmurs, rubs, or gallops, no lower extremity edema Lungs: clear to auscultation bilaterally with normal work of breathing Abdomen: soft, non-tender, non-distended, normoactive bowel sounds Skin: warm, dry, no rashes or lesions Extremities: warm and well perfused, normal tone  Assessment & Plan:   INSOMNIA NOS  Patient on several sedating medications such as gabapentin, trazodone, and hydrocodone without relief in symptoms. Notes 100mg  of Trazodone gave side effects.   At this time, it appears this is a very chronic issue that may not have a simple solution.  Will check TSH level and A1C level for cause of excessive urination.  Recommend OTC Melatonin 5-10mg  and working on her sleep hygiene. Will follow-up in a month to further evaluate.   Can consider Mirtazepine although she has had significant weight gain over the past year.   Prediabetes  A1C has been increasing slowly over the years. She has also experienced some significant weight gain.   Will recheck A1C  Will follow-up in 1 month to go over labs and evaluate weight gain in more depth  Hypothyroidism  Could be potential cause for her insomnia.  Will recheck TSH  level.   Refilled Synthroid 88mcg at this time. Will call in new script if need to make adjustements  Essential hypertension  Currently normotensive.   Continue current regimen  Will check CBC and CMP  HYPERCHOLESTEROLEMIA  Compliant with medication  Will check lipid panel   Healthcare maintenance  Patient opted to do FOBT.   Patient got flu vaccine. Return  precautions given.   HIV screen ordered  Will have patient return in 1 month to go over remainder of health maintenance   Orders Placed This Encounter  Procedures  . Fecal occult blood, imunochemical([facility])    Standing Status:   Future    Standing Expiration Date:   11/14/2018  . Flu Vaccine QUAD 36+ mos IM  . TSH    Standing Status:   Future    Standing Expiration Date:   11/14/2018  . Lipid Panel    Standing Status:   Future    Standing Expiration Date:   11/14/2018  . Hemoglobin A1c    Standing Status:   Future    Standing Expiration Date:   11/14/2018  . HIV antibody (with reflex)  . CBC with Differential    Standing Status:   Future    Standing Expiration Date:   11/14/2018  . Basic Metabolic Panel    Standing Status:   Future    Standing Expiration Date:   11/14/2018   Meds ordered this encounter  Medications  . allopurinol (ZYLOPRIM) 100 MG tablet    Sig: Take 1 tablet (100 mg total) by mouth daily.    Dispense:  90 tablet    Refill:  3  . levothyroxine (SYNTHROID, LEVOTHROID) 50 MCG tablet    Sig: Take 1 tablet (50 mcg total) by mouth daily.    Dispense:  30 tablet    Refill:  0   Mina Marble, DO PGY-1, [facility] 11/13/2017 7:31 PM

## 2017-11-06 ENCOUNTER — Other Ambulatory Visit: Payer: Self-pay | Admitting: Family Medicine

## 2017-11-06 NOTE — Telephone Encounter (Signed)
Spoke to patient concerning refill request. Patient is out of rx already. Will refill at this time. Patient has appt on 8/24. Will recheck TSH then and if adjustments need to be made, will call in new script. Patient agreed to this plan.

## 2017-11-13 ENCOUNTER — Encounter: Payer: Self-pay | Admitting: Family Medicine

## 2017-11-13 ENCOUNTER — Ambulatory Visit (INDEPENDENT_AMBULATORY_CARE_PROVIDER_SITE_OTHER): Payer: No Typology Code available for payment source | Admitting: Family Medicine

## 2017-11-13 ENCOUNTER — Other Ambulatory Visit: Payer: Self-pay

## 2017-11-13 VITALS — BP 114/68 | HR 60 | Temp 98.2°F | Ht 63.5 in | Wt 212.2 lb

## 2017-11-13 DIAGNOSIS — I10 Essential (primary) hypertension: Secondary | ICD-10-CM | POA: Diagnosis not present

## 2017-11-13 DIAGNOSIS — E039 Hypothyroidism, unspecified: Secondary | ICD-10-CM

## 2017-11-13 DIAGNOSIS — E7800 Pure hypercholesterolemia, unspecified: Secondary | ICD-10-CM | POA: Diagnosis not present

## 2017-11-13 DIAGNOSIS — Z114 Encounter for screening for human immunodeficiency virus [HIV]: Secondary | ICD-10-CM | POA: Diagnosis not present

## 2017-11-13 DIAGNOSIS — G4700 Insomnia, unspecified: Secondary | ICD-10-CM

## 2017-11-13 DIAGNOSIS — Z23 Encounter for immunization: Secondary | ICD-10-CM

## 2017-11-13 DIAGNOSIS — Z0000 Encounter for general adult medical examination without abnormal findings: Secondary | ICD-10-CM | POA: Diagnosis not present

## 2017-11-13 DIAGNOSIS — R7303 Prediabetes: Secondary | ICD-10-CM | POA: Diagnosis not present

## 2017-11-13 DIAGNOSIS — Z1211 Encounter for screening for malignant neoplasm of colon: Secondary | ICD-10-CM | POA: Diagnosis not present

## 2017-11-13 DIAGNOSIS — M1A0720 Idiopathic chronic gout, left ankle and foot, without tophus (tophi): Secondary | ICD-10-CM

## 2017-11-13 MED ORDER — ALLOPURINOL 100 MG PO TABS: 100.0000 mg | ORAL_TABLET | Freq: Every day | ORAL | 3 refills | Status: DC

## 2017-11-13 MED ORDER — LEVOTHYROXINE SODIUM 50 MCG PO TABS: 50.0000 ug | ORAL_TABLET | Freq: Every day | ORAL | 0 refills | Status: DC

## 2017-11-13 NOTE — Assessment & Plan Note (Addendum)
   Compliant with medication  Will check lipid panel

## 2017-11-13 NOTE — Assessment & Plan Note (Deleted)
Refilled Allopurinol

## 2017-11-13 NOTE — Patient Instructions (Addendum)
Thank you for coming to see me today. It was a pleasure. Today we talked about:   Your sleep trouble. Please try Melatonin 5mg -10mg . Also try to have good sleep hygiene. Please schedule a lab visit to get your lab work done.   Please follow-up with me in 1-2 months for Health Maintenance and follow-up your lab work.  If you have any questions or concerns, please do not hesitate to call the office at [phone].  Take Care,   Dr. Mina Marble, DO Resident Physician [place] [phone]  Sleep Hygiene: Good sleep habits (sometimes referred to as "sleep hygiene") can help you get a good night's sleep. Some habits that can improve your sleep health:  Be consistent. Go to bed at the same time each night and get up at the same time each morning, including on the weekends (7 1/2 to 8 1/2 hours for the average adult) Make sure your bedroom is quiet, dark, relaxing, and at a comfortable temperature Remove electronic devices, such as TVs, computers, and smart phones, from the bedroom. Try not to use or play on electronic devices at bedtime. If you like to read at bedtime on an electronic device, try to dim the background light as much as possible. Avoid large meals, caffeine, and alcohol before bedtime (at least 2 hours before bedtime).  Get some exercise. Being physically active during the day can help you fall asleep more easily at night. But avoid exercise 2 hours prior to bedtime For stress relief, try meditation, deep breathing exercises (there are many books and CDs available), a white noise machine or fan can help to diffuse other noise distractors, such as traffic noise. Avoid narcotic pain medication close to bedtime, as opioids/narcotics can suppress breathing drive and breathing effort.  Never mix alcohol and sedating medications!

## 2017-11-13 NOTE — Assessment & Plan Note (Addendum)
   Patient opted to do FOBT.   Patient got flu vaccine. Return precautions given.   HIV screen ordered  Will have patient return in 1 month to go over remainder of health maintenance

## 2017-11-13 NOTE — Assessment & Plan Note (Addendum)
   Currently normotensive.   Continue current regimen  Will check CBC and CMP

## 2017-11-13 NOTE — Assessment & Plan Note (Addendum)
   Could be potential cause for her insomnia.  Will recheck TSH level.   Refilled Synthroid 23mcg at this time. Will call in new script if need to make adjustements

## 2017-11-13 NOTE — Assessment & Plan Note (Addendum)
   A1C has been increasing slowly over the years. She has also experienced some significant weight gain.   Will recheck A1C  Will follow-up in 1 month to go over labs and evaluate weight gain in more depth

## 2017-11-13 NOTE — Assessment & Plan Note (Addendum)
   Patient on several sedating medications such as gabapentin, trazodone, and hydrocodone without relief in symptoms. Notes 100mg  of Trazodone gave side effects.   At this time, it appears this is a very chronic issue that may not have a simple solution.  Will check TSH level and A1C level for cause of excessive urination.  Recommend OTC Melatonin 5-10mg  and working on her sleep hygiene. Will follow-up in a month to further evaluate.   Can consider Mirtazepine although she has had significant weight gain over the past year.

## 2017-11-23 ENCOUNTER — Encounter: Payer: Self-pay | Admitting: Family Medicine

## 2017-11-24 ENCOUNTER — Telehealth: Payer: Self-pay | Admitting: *Deleted

## 2017-11-24 NOTE — Telephone Encounter (Signed)
Pt informed and scheduled for an appt. Deseree Blount, CMA  

## 2017-11-24 NOTE — Telephone Encounter (Signed)
-----   Message from [name], [place] sent at 11/24/2017  9:34 AM EST ----- Regarding: Labs to be completed + f/u appt Can you call patient and remind her of her labs that she needs to have done. She will just need to make a lab appt.  She also needs to make a follow-up appointment with me to go over those her labs in a couple of weeks.   Thank you!!

## 2017-11-25 ENCOUNTER — Ambulatory Visit: Payer: Self-pay | Admitting: Family Medicine

## 2017-11-25 ENCOUNTER — Other Ambulatory Visit: Payer: Self-pay

## 2017-12-01 ENCOUNTER — Telehealth: Payer: Self-pay | Admitting: Family Medicine

## 2017-12-01 NOTE — Telephone Encounter (Signed)
Called pt to reminder her of lab results fu appt but she stated that she still needed to get the lab work done first. I am sending this note to her PCP so they can verify which labs she needs done. She would like to come in tomorrow for the lab work and please give her a time for her appt tomorrow.

## 2017-12-02 ENCOUNTER — Ambulatory Visit: Payer: Self-pay | Admitting: Family Medicine

## 2017-12-02 NOTE — Telephone Encounter (Signed)
Exactly. She just needs to come in for her labs. They are already ordered, she just needs to come in to have them done. She does not need to see me.

## 2017-12-03 ENCOUNTER — Telehealth: Payer: Self-pay | Admitting: Family Medicine

## 2017-12-03 NOTE — Telephone Encounter (Signed)
Called pt to set up appt for lab work

## 2017-12-03 NOTE — Telephone Encounter (Signed)
Got it. I called her back today and she is scheduled to come in for lab work tomorrow.

## 2017-12-04 ENCOUNTER — Other Ambulatory Visit: Payer: Self-pay

## 2017-12-04 ENCOUNTER — Other Ambulatory Visit: Payer: Medicare Other

## 2017-12-04 DIAGNOSIS — E039 Hypothyroidism, unspecified: Secondary | ICD-10-CM

## 2017-12-04 DIAGNOSIS — E7800 Pure hypercholesterolemia, unspecified: Secondary | ICD-10-CM

## 2017-12-04 DIAGNOSIS — I10 Essential (primary) hypertension: Secondary | ICD-10-CM

## 2017-12-04 DIAGNOSIS — R7303 Prediabetes: Secondary | ICD-10-CM

## 2017-12-05 LAB — HEMOGLOBIN A1C
ESTIMATED AVERAGE GLUCOSE: 123 mg/dL
HEMOGLOBIN A1C: 5.9 % — AB (ref 4.8–5.6)

## 2017-12-05 LAB — CBC WITH DIFFERENTIAL/PLATELET
Basophils Absolute: 0 10*3/uL (ref 0.0–0.2)
Basos: 1 %
EOS (ABSOLUTE): 0.2 10*3/uL (ref 0.0–0.4)
Eos: 3 %
Hematocrit: 42.9 % (ref 34.0–46.6)
Hemoglobin: 14.6 g/dL (ref 11.1–15.9)
Immature Grans (Abs): 0 10*3/uL (ref 0.0–0.1)
Immature Granulocytes: 0 %
LYMPHS ABS: 2.5 10*3/uL (ref 0.7–3.1)
LYMPHS: 36 %
MCH: 30.1 pg (ref 26.6–33.0)
MCHC: 34 g/dL (ref 31.5–35.7)
MCV: 89 fL (ref 79–97)
MONOCYTES: 5 %
Monocytes Absolute: 0.4 10*3/uL (ref 0.1–0.9)
NEUTROS ABS: 3.9 10*3/uL (ref 1.4–7.0)
Neutrophils: 55 %
PLATELETS: 345 10*3/uL (ref 150–450)
RBC: 4.85 x10E6/uL (ref 3.77–5.28)
RDW: 13.7 % (ref 12.3–15.4)
WBC: 7 10*3/uL (ref 3.4–10.8)

## 2017-12-05 LAB — BASIC METABOLIC PANEL
BUN / CREAT RATIO: 13 (ref 9–23)
BUN: 15 mg/dL (ref 6–24)
CALCIUM: 9.3 mg/dL (ref 8.7–10.2)
CHLORIDE: 100 mmol/L (ref 96–106)
CO2: 26 mmol/L (ref 20–29)
Creatinine, Ser: 1.19 mg/dL — ABNORMAL HIGH (ref 0.57–1.00)
GFR calc non Af Amer: 51 mL/min/{1.73_m2} — ABNORMAL LOW (ref >59)
GFR, EST AFRICAN AMERICAN: 59 mL/min/{1.73_m2} — AB (ref >59)
GLUCOSE: 124 mg/dL — AB (ref 65–99)
POTASSIUM: 5.2 mmol/L (ref 3.5–5.2)
Sodium: 139 mmol/L (ref 134–144)

## 2017-12-05 LAB — LIPID PANEL
CHOL/HDL RATIO: 3.5 ratio (ref 0.0–4.4)
Cholesterol, Total: 167 mg/dL (ref 100–199)
HDL: 48 mg/dL (ref >39)
LDL Calculated: 84 mg/dL (ref 0–99)
Triglycerides: 173 mg/dL — ABNORMAL HIGH (ref 0–149)
VLDL Cholesterol Cal: 35 mg/dL (ref 5–40)

## 2017-12-05 LAB — TSH: TSH: 2.33 u[IU]/mL (ref 0.450–4.500)

## 2017-12-08 ENCOUNTER — Encounter: Payer: Self-pay | Admitting: Family Medicine

## 2017-12-08 NOTE — Telephone Encounter (Signed)
Attempted to make an appt but pt declined and said she would rather the doctor tell her over the phone, due to it being hard for her to get here. Please advise. Deseree Kennon Holter, CMA

## 2017-12-08 NOTE — Telephone Encounter (Signed)
-----   Message from [name], [place] sent at 12/08/2017  3:01 PM EST ----- Please have patient schedule a follow-up appointment for 1 month or at earliest convenience to go over lab results.

## 2017-12-10 ENCOUNTER — Telehealth: Payer: Self-pay | Admitting: Family Medicine

## 2017-12-10 NOTE — Telephone Encounter (Signed)
Spoke to patient about her lab results. Patient to continue current medications as prescribed. She understood and agreed to plan. Discussed slightly elevated A1C and recommended life-style modifications and exercise as tolerated. Patient to RTC in 6 months for a check up, sooner prn problems. If all is going well, can increase visits to yearly.   Patient endorsed "increased bruising" around her joints. Platelets WNL on most recent CBC. Patient on baby aspirin. Instructed patient if she is concerned she should make an appointment to have it evaluated. She understood.  Patient is also concerned about her weight. She asked about advice. She eats 1 small meal a day on a small saucer. She denies any fried foods or excessive carbs. Unable to exercise due to back and knees. Has had a discussion in past about "stomach surgery" but did not want to do it at the time. Recommended that we discuss this further at her follow-up visit. She agreed and understood.

## 2018-01-03 ENCOUNTER — Other Ambulatory Visit (HOSPITAL_COMMUNITY): Payer: Self-pay | Admitting: Psychiatry

## 2018-01-03 DIAGNOSIS — G4701 Insomnia due to medical condition: Secondary | ICD-10-CM

## 2018-01-12 ENCOUNTER — Other Ambulatory Visit: Payer: Self-pay | Admitting: Cardiology

## 2018-01-19 DIAGNOSIS — M4316 Spondylolisthesis, lumbar region: Secondary | ICD-10-CM | POA: Diagnosis not present

## 2018-01-19 DIAGNOSIS — Z6837 Body mass index (BMI) 37.0-37.9, adult: Secondary | ICD-10-CM | POA: Diagnosis not present

## 2018-01-19 DIAGNOSIS — M961 Postlaminectomy syndrome, not elsewhere classified: Secondary | ICD-10-CM | POA: Diagnosis not present

## 2018-01-22 ENCOUNTER — Other Ambulatory Visit: Payer: Self-pay | Admitting: Family Medicine

## 2018-01-22 DIAGNOSIS — E039 Hypothyroidism, unspecified: Secondary | ICD-10-CM

## 2018-02-04 ENCOUNTER — Other Ambulatory Visit: Payer: Self-pay | Admitting: Cardiology

## 2018-02-04 NOTE — Telephone Encounter (Signed)
Rx request sent to pharmacy.  

## 2018-03-04 ENCOUNTER — Other Ambulatory Visit: Payer: Self-pay | Admitting: Cardiology

## 2018-03-16 DIAGNOSIS — M4316 Spondylolisthesis, lumbar region: Secondary | ICD-10-CM | POA: Diagnosis not present

## 2018-03-16 DIAGNOSIS — Z6838 Body mass index (BMI) 38.0-38.9, adult: Secondary | ICD-10-CM | POA: Diagnosis not present

## 2018-03-16 DIAGNOSIS — M961 Postlaminectomy syndrome, not elsewhere classified: Secondary | ICD-10-CM | POA: Diagnosis not present

## 2018-03-31 ENCOUNTER — Other Ambulatory Visit: Payer: Self-pay | Admitting: Cardiology

## 2018-04-10 ENCOUNTER — Other Ambulatory Visit: Payer: Self-pay

## 2018-04-10 ENCOUNTER — Telehealth: Payer: Self-pay | Admitting: *Deleted

## 2018-04-10 NOTE — Telephone Encounter (Signed)
Pt LM on  Nurse line.  She states that she is unsure if she is sick or suffering from allergies.   She c/o of sore throat, cough, sneezing, eye watering.  She does not think she is running a fever but does not have a thermometer. She states that she tried allergy medicine but it did not help.  Attempted to call back, no answer and no machine.  Christen Bame, CMA

## 2018-04-12 MED ORDER — ATORVASTATIN CALCIUM 20 MG PO TABS: 20.0000 mg | ORAL_TABLET | Freq: Every day | ORAL | 3 refills | Status: DC

## 2018-04-23 ENCOUNTER — Other Ambulatory Visit: Payer: Self-pay | Admitting: Family Medicine

## 2018-04-23 DIAGNOSIS — E039 Hypothyroidism, unspecified: Secondary | ICD-10-CM

## 2018-05-01 ENCOUNTER — Telehealth: Payer: Self-pay | Admitting: *Deleted

## 2018-05-01 NOTE — Telephone Encounter (Signed)
Need to talk with patient about appt on 4/16 pm -- ? What sx , can it be postpone ,and discuss what type of e -visit is neded.

## 2018-05-04 MED ORDER — METOPROLOL TARTRATE 25 MG PO TABS: ORAL_TABLET | ORAL | 6 refills | Status: DC

## 2018-05-04 MED ORDER — METOPROLOL SUCCINATE ER 50 MG PO TB24: 50.0000 mg | ORAL_TABLET | Freq: Two times a day (BID) | ORAL | 2 refills | Status: DC

## 2018-05-04 NOTE — Telephone Encounter (Signed)
   Primary Cardiologist:  No primary care provider on file.   Patient contacted.  History reviewed.  No symptoms to suggest any unstable cardiac conditions.  Based on discussion, with current pandemic situation, we will be postponing this appointment for [name] with a plan for f/u in SEPT 21,20  AT 3:40 PM or sooner if feasible/necessary.  If symptoms change, she has been instructed to contact our office.   MEDICATION WS REFILLED UNTIL SEPT VISIT  Raiford Simmonds, RN  05/04/2018 5:29 PM         .

## 2018-05-07 ENCOUNTER — Ambulatory Visit: Payer: Self-pay | Admitting: Cardiology

## 2018-05-29 ENCOUNTER — Ambulatory Visit: Payer: Medicare Other | Admitting: Family Medicine

## 2018-06-09 DIAGNOSIS — M961 Postlaminectomy syndrome, not elsewhere classified: Secondary | ICD-10-CM | POA: Diagnosis not present

## 2018-06-09 DIAGNOSIS — M1710 Unilateral primary osteoarthritis, unspecified knee: Secondary | ICD-10-CM | POA: Diagnosis not present

## 2018-06-09 DIAGNOSIS — M179 Osteoarthritis of knee, unspecified: Secondary | ICD-10-CM | POA: Insufficient documentation

## 2018-07-05 ENCOUNTER — Other Ambulatory Visit: Payer: Self-pay | Admitting: Family Medicine

## 2018-07-05 DIAGNOSIS — E039 Hypothyroidism, unspecified: Secondary | ICD-10-CM

## 2018-07-24 ENCOUNTER — Other Ambulatory Visit: Payer: Self-pay | Admitting: Family Medicine

## 2018-07-24 DIAGNOSIS — K219 Gastro-esophageal reflux disease without esophagitis: Secondary | ICD-10-CM

## 2018-07-27 ENCOUNTER — Other Ambulatory Visit: Payer: Self-pay

## 2018-07-27 DIAGNOSIS — L304 Erythema intertrigo: Secondary | ICD-10-CM

## 2018-07-28 MED ORDER — NYSTATIN 100000 UNIT/GM EX CREA: TOPICAL_CREAM | CUTANEOUS | 2 refills | Status: DC

## 2018-09-17 DIAGNOSIS — M1710 Unilateral primary osteoarthritis, unspecified knee: Secondary | ICD-10-CM | POA: Diagnosis not present

## 2018-09-17 DIAGNOSIS — M961 Postlaminectomy syndrome, not elsewhere classified: Secondary | ICD-10-CM | POA: Diagnosis not present

## 2018-09-17 DIAGNOSIS — Z6839 Body mass index (BMI) 39.0-39.9, adult: Secondary | ICD-10-CM | POA: Diagnosis not present

## 2018-09-17 DIAGNOSIS — M4316 Spondylolisthesis, lumbar region: Secondary | ICD-10-CM | POA: Diagnosis not present

## 2018-10-12 ENCOUNTER — Ambulatory Visit: Payer: Self-pay | Admitting: Cardiology

## 2018-10-13 ENCOUNTER — Other Ambulatory Visit: Payer: Self-pay | Admitting: Family Medicine

## 2018-10-13 DIAGNOSIS — E039 Hypothyroidism, unspecified: Secondary | ICD-10-CM

## 2018-11-20 ENCOUNTER — Other Ambulatory Visit: Payer: Self-pay | Admitting: Family Medicine

## 2018-11-20 DIAGNOSIS — M1A0720 Idiopathic chronic gout, left ankle and foot, without tophus (tophi): Secondary | ICD-10-CM

## 2018-11-26 ENCOUNTER — Encounter: Payer: Self-pay | Admitting: Cardiology

## 2018-11-26 ENCOUNTER — Ambulatory Visit (INDEPENDENT_AMBULATORY_CARE_PROVIDER_SITE_OTHER): Payer: No Typology Code available for payment source | Admitting: Cardiology

## 2018-11-26 ENCOUNTER — Other Ambulatory Visit: Payer: Self-pay

## 2018-11-26 VITALS — BP 127/87 | HR 70 | Temp 97.1°F | Ht 63.5 in | Wt 237.8 lb

## 2018-11-26 DIAGNOSIS — E7800 Pure hypercholesterolemia, unspecified: Secondary | ICD-10-CM | POA: Diagnosis not present

## 2018-11-26 DIAGNOSIS — I10 Essential (primary) hypertension: Secondary | ICD-10-CM | POA: Diagnosis not present

## 2018-11-26 DIAGNOSIS — E669 Obesity, unspecified: Secondary | ICD-10-CM

## 2018-11-26 DIAGNOSIS — Z720 Tobacco use: Secondary | ICD-10-CM | POA: Diagnosis not present

## 2018-11-26 DIAGNOSIS — M7989 Other specified soft tissue disorders: Secondary | ICD-10-CM | POA: Diagnosis not present

## 2018-11-26 DIAGNOSIS — R002 Palpitations: Secondary | ICD-10-CM

## 2018-11-26 MED ORDER — ROSUVASTATIN CALCIUM 20 MG PO TABS: 20.0000 mg | ORAL_TABLET | Freq: Every day | ORAL | 3 refills | Status: DC
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
## 2018-11-26 NOTE — Patient Instructions (Signed)
Medication Instructions:  DO  NOT RESTART ATORVASTATIN   -START TAKING ROSUVASTATIN 10 MG - ( 1/2 TABLET OF 20 MG )  3 DAYS A WEEK  THEN INCREASE TO 20 MG FOR 3 DAYS A WEEK FOR 2 WEEKS THEN INCREASE 1 TABLET DAILY UNTIL YOU REACH 7 DAYS IF TOLERATED.   If you need a refill on your cardiac medications before your next appointment, please call your pharmacy*  Lab Work:  [place] 4 MONTHS - April  2021- FASTING   If you have labs (blood work) drawn today and your tests are completely normal, you will receive your results only by: Marland Kitchen MyChart Message (if you have MyChart) OR . A paper copy in the mail If you have any lab test that is abnormal or we need to change your treatment, we will call you to review the results.  Testing/Procedures:   not needed Follow-Up: At [facility], you and your health needs are our priority.  As part of our continuing mission to provide you with exceptional heart care, we have created designated Provider Care Teams.  These Care Teams include your primary Cardiologist (physician) and Advanced Practice Providers (APPs -  Physician Assistants and Nurse Practitioners) who all work together to provide you with the care you need, when you need it.  Your next appointment:   12 months  The format for your next appointment:   In Person  Provider:   Glenetta Hew, MD  Other Instructions

## 2018-12-01 ENCOUNTER — Encounter: Payer: Self-pay | Admitting: Cardiology

## 2018-12-01 ENCOUNTER — Other Ambulatory Visit: Payer: Self-pay | Admitting: Cardiology

## 2018-12-01 NOTE — Assessment & Plan Note (Signed)
Well-controlled on beta-blocker.  No change.

## 2018-12-01 NOTE — Telephone Encounter (Signed)
°*  STAT* If patient is at the pharmacy, call can be transferred to refill team.   1. Which medications need to be refilled? (please list name of each medication and dose if known)  Furosemide    2. Which pharmacy/location (including street and city if local pharmacy) is medication to be sent to?  [id number] [place], [place] - [address]  3. Do they need a 30 day or 90 day supply? 30   Patient thought she was going to get a new rx for lasix at her appt on 11/05, but nothing was sent to her pharmacy.

## 2018-12-01 NOTE — Assessment & Plan Note (Signed)
We talked again about the importance of smoking cessation.  She acknowledges that she should quit but is still finding it very difficult.  Not ready to do it at this point.

## 2018-12-01 NOTE — Assessment & Plan Note (Addendum)
A little bit of edema which is intermittent.  I think but will do was add a low-dose diuretic with Lasix 20 mg daily.  Not really noticing any PND orthopnea.  I doubt that this is truly cardiac in nature -- more likely venous stasis related.  We discussed foot elevation and potentially using support hose.

## 2018-12-01 NOTE — Assessment & Plan Note (Signed)
Doing pretty well now taking the 50 mg twice daily of Toprol.  She prefers doing it this way when taking 100 mg 1 time because of hypotension issues.  Not having to use much in the way of any as needed metoprolol tartrate.

## 2018-12-01 NOTE — Assessment & Plan Note (Signed)
Unfortunately, she has gained quite a bit of weight in the last year and a half.  Not sure the overall reason.  We discussed dietary modifications and increased exercise.  I think a lot of it has to do with her arthritis pains etc.

## 2018-12-01 NOTE — Assessment & Plan Note (Addendum)
She has been on atorvastatin, but has subsequently stopped taking it.  I think for risk factor modification is important for her to be on statin.  I would like for her to try rosuvastatin.   -START TAKING ROSUVASTATIN 10 MG - ( 1/2 TABLET OF 20 MG )  3 DAYS A WEEK  THEN INCREASE TO 20 MG FOR 3 DAYS A WEEK FOR 2 WEEKS THEN INCREASE 1 TABLET DAILY UNTIL YOU REACH 7 DAYS IF TOLERATED.

## 2018-12-02 NOTE — Telephone Encounter (Signed)
Yes the plan was 20 mg daily Lasix (take as needed)  Glenetta Hew, MD

## 2018-12-02 NOTE — Telephone Encounter (Signed)
Furosemide 20 mg 1 tab daily as needed/as directed. Dispense 90 tabs, 4 refills

## 2018-12-03 MED ORDER — FUROSEMIDE 20 MG PO TABS: 20.0000 mg | ORAL_TABLET | Freq: Every day | ORAL | 4 refills | Status: DC | PRN

## 2018-12-03 NOTE — Telephone Encounter (Signed)
Ok, thank you! Rx has been sent to requested pharmacy.

## 2019-02-05 ENCOUNTER — Other Ambulatory Visit: Payer: Self-pay | Admitting: *Deleted

## 2019-02-05 DIAGNOSIS — E039 Hypothyroidism, unspecified: Secondary | ICD-10-CM

## 2019-02-10 MED ORDER — LEVOTHYROXINE SODIUM 50 MCG PO TABS: 50.0000 ug | ORAL_TABLET | Freq: Every day | ORAL | 2 refills | Status: DC

## 2019-02-10 NOTE — Telephone Encounter (Signed)
Will refill. However it has been >1 year since she has been seen. Please have her schedule annual exam at earliest convenience. Thank you.

## 2019-03-08 DIAGNOSIS — M4316 Spondylolisthesis, lumbar region: Secondary | ICD-10-CM | POA: Diagnosis not present

## 2019-03-08 DIAGNOSIS — M79673 Pain in unspecified foot: Secondary | ICD-10-CM | POA: Insufficient documentation

## 2019-03-08 DIAGNOSIS — M961 Postlaminectomy syndrome, not elsewhere classified: Secondary | ICD-10-CM | POA: Diagnosis not present

## 2019-03-08 DIAGNOSIS — M79671 Pain in right foot: Secondary | ICD-10-CM | POA: Diagnosis not present

## 2019-03-19 ENCOUNTER — Other Ambulatory Visit: Payer: Self-pay | Admitting: Cardiology

## 2019-03-23 ENCOUNTER — Other Ambulatory Visit: Payer: Self-pay | Admitting: Cardiology

## 2019-04-02 ENCOUNTER — Ambulatory Visit: Payer: No Typology Code available for payment source | Admitting: Family Medicine

## 2019-04-08 ENCOUNTER — Ambulatory Visit: Payer: No Typology Code available for payment source | Admitting: Family Medicine

## 2019-04-09 ENCOUNTER — Ambulatory Visit: Payer: No Typology Code available for payment source | Admitting: Family Medicine

## 2019-06-16 ENCOUNTER — Other Ambulatory Visit: Payer: Self-pay | Admitting: Family Medicine

## 2019-06-16 DIAGNOSIS — K219 Gastro-esophageal reflux disease without esophagitis: Secondary | ICD-10-CM

## 2019-08-11 ENCOUNTER — Other Ambulatory Visit: Payer: Self-pay | Admitting: Family Medicine

## 2019-09-06 ENCOUNTER — Other Ambulatory Visit: Payer: Self-pay | Admitting: Cardiology

## 2019-09-12 ENCOUNTER — Telehealth: Payer: Self-pay | Admitting: Cardiology

## 2019-09-12 NOTE — Telephone Encounter (Signed)
   Received call from patient regarding palpitations and chest discomfort. She states that approximately three days ago she began having intermittent anterior chest pain which would radiate to her left arm. She also states she has been having palpitations which are sometimes associated with the chest pain. Per chart review, it appears that she has had difficulty with this in the past. She is prescribed PRN metoprolol which she has not taken this AM however she has taken her regular dose of Toprol 50. BP 149/94. She reports HR was 84. This was checked at the local [place]. She has no documented hx of CAD however has significant risk factors that include. HLD, tobacco use and HTN. I recommended that if her symptoms do not improve with the addition of metoprolol, she will need to go to an UC or ED for EKG and labs. Patient understands and agrees.    Kathyrn Drown NP-C [place] Pager: [phone]

## 2019-09-29 ENCOUNTER — Other Ambulatory Visit: Payer: Self-pay | Admitting: Family Medicine

## 2019-09-29 DIAGNOSIS — L304 Erythema intertrigo: Secondary | ICD-10-CM

## 2019-10-15 ENCOUNTER — Other Ambulatory Visit: Payer: Self-pay | Admitting: Cardiology

## 2019-11-13 ENCOUNTER — Other Ambulatory Visit: Payer: Self-pay | Admitting: Family Medicine

## 2019-11-13 DIAGNOSIS — E039 Hypothyroidism, unspecified: Secondary | ICD-10-CM

## 2019-11-17 ENCOUNTER — Ambulatory Visit (INDEPENDENT_AMBULATORY_CARE_PROVIDER_SITE_OTHER): Payer: Medicare HMO | Admitting: Family Medicine

## 2019-11-17 ENCOUNTER — Encounter: Payer: Self-pay | Admitting: Family Medicine

## 2019-11-17 ENCOUNTER — Other Ambulatory Visit: Payer: Self-pay

## 2019-11-17 VITALS — BP 110/72 | HR 76 | Wt 243.8 lb

## 2019-11-17 DIAGNOSIS — Z8781 Personal history of (healed) traumatic fracture: Secondary | ICD-10-CM

## 2019-11-17 DIAGNOSIS — Z1211 Encounter for screening for malignant neoplasm of colon: Secondary | ICD-10-CM

## 2019-11-17 DIAGNOSIS — R0683 Snoring: Secondary | ICD-10-CM | POA: Diagnosis not present

## 2019-11-17 DIAGNOSIS — M1A0720 Idiopathic chronic gout, left ankle and foot, without tophus (tophi): Secondary | ICD-10-CM

## 2019-11-17 DIAGNOSIS — E039 Hypothyroidism, unspecified: Secondary | ICD-10-CM

## 2019-11-17 DIAGNOSIS — I10 Essential (primary) hypertension: Secondary | ICD-10-CM | POA: Diagnosis not present

## 2019-11-17 DIAGNOSIS — K219 Gastro-esophageal reflux disease without esophagitis: Secondary | ICD-10-CM

## 2019-11-17 DIAGNOSIS — R7303 Prediabetes: Secondary | ICD-10-CM

## 2019-11-17 DIAGNOSIS — E782 Mixed hyperlipidemia: Secondary | ICD-10-CM

## 2019-11-17 DIAGNOSIS — K279 Peptic ulcer, site unspecified, unspecified as acute or chronic, without hemorrhage or perforation: Secondary | ICD-10-CM

## 2019-11-17 DIAGNOSIS — Z1231 Encounter for screening mammogram for malignant neoplasm of breast: Secondary | ICD-10-CM

## 2019-11-17 DIAGNOSIS — Z720 Tobacco use: Secondary | ICD-10-CM

## 2019-11-17 LAB — POCT GLYCOSYLATED HEMOGLOBIN (HGB A1C): Hemoglobin A1C: 6.3 % — AB (ref 4.0–5.6)

## 2019-11-17 MED ORDER — ALLOPURINOL 100 MG PO TABS: 100.0000 mg | ORAL_TABLET | Freq: Every day | ORAL | 3 refills | Status: DC

## 2019-11-17 MED ORDER — LANSOPRAZOLE 30 MG PO CPDR: DELAYED_RELEASE_CAPSULE | ORAL | 1 refills | Status: DC

## 2019-11-17 MED ORDER — METFORMIN HCL 500 MG PO TABS: 500.0000 mg | ORAL_TABLET | Freq: Two times a day (BID) | ORAL | 2 refills | Status: DC

## 2019-11-17 MED ORDER — LEVOTHYROXINE SODIUM 50 MCG PO TABS: 50.0000 ug | ORAL_TABLET | Freq: Every day | ORAL | 0 refills | Status: DC

## 2019-11-17 NOTE — Assessment & Plan Note (Signed)
A1C continues to slowly trend upwards, up from 5.9 to 6.3. Likely secondary to weight gain, poor diet, and sedentary lifestyle. Patient amendable to starting medication. - start Metformin 500mg  BID. Can increase to 1000mg  BID if tolerates after 1-2 weeks.  - follow up in 3 months for continued monitoring

## 2019-11-17 NOTE — Progress Notes (Signed)
Subjective:   Patient ID: [name]    DOB: 02/03/1960, 59 y.o. female   MRN: [id number]  [name] is a 59 y.o. female with a history of hypertension, allergic rhinitis, hypothyroidism, lumbar radiculopathy, ankylosing spondylitis, osteoporosis, scoliosis, spondylolisthesis at L3/L4 level, CKD stage III, coagulopathy, anxiety/depression, insomnia, abnormal Pap smear, obesity, prediabetes, rheumatoid arthritis, tobacco abuse, vitamin D deficiency here for follow up.  Pre-Diabetes: Last three A1C's below. Currently diet controlled. Endorses compliance. Denies any polyuria, polydipsia, polyphagia.   Lab Results  Component Value Date   HGBA1C 6.3 (A) 11/17/2019   HGBA1C 5.9 (H) 12/04/2017   HGBA1C 5.9 08/23/2016    HTN:  BP: 110/72 today. Currently on Metoprolol 50mg  BID with 25mg  PRN for anxiety or palpitations. She notes that she is often taking it everyday due to sleep issues. Endorses compliance. Current everyday smoker: 3/4 PPD x 36 years. Denies any chest pain, SOB, vision changes, or headaches.  Last kidney function obtained in 11/2017 Cr of 1.19 (BL 1.0-1.2).    HLD: Last lipid panel below. Currently on Rosuvastatin 20mg . Endorses compliance. Denies any muscles aches or weakness. The 10-year ASCVD risk score Mikey Bussing DC Jr., et al., 2013) is: 6.1%   Lab Results  Component Value Date   CHOL 167 12/04/2017   HDL 48 12/04/2017   LDLCALC 84 12/04/2017   LDLDIRECT 174 (H) 09/06/2013   TRIG 173 (H) 12/04/2017   CHOLHDL 3.5 12/04/2017    Hypothyroidism: Last TSH 2.330 in 11/2017. Current medications include Levothyroxine 76mcg QD. Has been out for 5 days.   History of nasal fracture Difficulty breathing out of nose at night. Notes that she broke her nose at work in her 36's. She notes that they braced it up which improved thus did not require surgery. She has tried nasal strips but they cause irritation to her skin. She desires referral to ENT for further evaluation.    Snoring  Morbid Obesity: Has not had official evaluation for OSA. Does currently wear oxygen at night. Patient notes her husband notes that she snores now at night.  Health Maintenance: Health Maintenance Due  Topic  . COLON CANCER SCREENING ANNUAL FOBT   . MAMMOGRAM   . PAP SMEAR-Modifier   . INFLUENZA VACCINE    Review of Systems:  Per HPI.   Objective:   BP 110/72   Pulse 76   Wt 243 lb 12.8 oz (110.6 kg)   LMP 05/17/2009 (LMP Unknown)   SpO2 94%   BMI 42.51 kg/m  Vitals and nursing note reviewed.  General: pleasant older woman, sitting comfortably in exam chair, well nourished, well developed, in no acute distress with non-toxic appearance CV: regular rate and rhythm without murmurs, rubs, or gallops Lungs: clear to auscultation bilaterally with normal work of breathing on room air Resp: breathing comfortably on room air, speaking in full sentences Skin: warm, dry MSK:  gait normal Neuro: Alert and oriented, speech normal  Assessment & Plan:   Essential hypertension Normotensive. Well controlled with Metoprolol 50mg  BID.  Prediabetes status with worsening A1C and morbid obesity. Chronic tobacco user. Would likely benefit from ACE/ARB in the future.   Hypothyroidism Chronic.  TSH obtained today Has been out of Synthroid 16mcg x 5 day. Refill provided. Follow up TSH and adjust if needed  Hyperlipidemia Lipid panel obtained. Continue Crestor. Will increase if LDL>100  Morbid obesity ([place]) Continues to gain weight, now with BMI >40. Difficult for patient to exercise. Interested in referral for weight management and  bariatric surgery. - referral to weight management clinic and bariatric surgery - recommended lifsteyle changes, focused on activity and healthy eating including heart healthy/Mediterranean diet, with whole grains, fruits, vegetable, fish, lean meats, nuts, and olive oil. Limit salt. -recommend moderate walking, 3-5 times/week for 30-50 minutes each  session. Aim for at least 150 minutes.week. Goal should be pace of 3 miles/hours, or walking 1.5 miles in 30 minutes -recommend avoidance of tobacco products. Avoid excess alcohol.  Prediabetes A1C continues to slowly trend upwards, up from 5.9 to 6.3. Likely secondary to weight gain, poor diet, and sedentary lifestyle. Patient amendable to starting medication. - start Metformin 500mg  BID. Can increase to 1000mg  BID if tolerates after 1-2 weeks.  - follow up in 3 months for continued monitoring  Tobacco abuse Tobacco use: Smoked 3/4 pack/day x 36 years (27 pack year history). Does qualify for lung cancer screen at this time. Patient was counseled on the risks of tobacco use and cessation strongly encouraged. - Low dose CT scan ordered and scheduled  Snoring Morbid obesity and snoring. Wears oxygen at night for hypoxia.  - referral for sleep study  History of fracture of nasal bone Patient endorses difficulty breathing out of her nose at night. Intolerant to nasal strips. History of nasal fracture that she feels has led to abnormality to her anatomy. Desires referral to ENT. - Will refer for sleep study first as I suspect OSA/OHS is contributing to this - If persists can consider referral to ENT  Peptic ulcer GERD symptoms well controlled with Prevacid.  - refill provided  Gout Well controlled with Allopurinol 100mg  QD - refill provided  Health Maintenance: - Cologuard ordered. NO family or personal history of colon cancer.  - Mammogram ordered. Patient instructed to call - Due for flu shot - will continue to offer - Due for pap-smear. Has history of abnormal paps. Given number of acute problems, will defer this to next visit.   Orders Placed This Encounter  Procedures  . MM Digital Screening    Standing Status:   Future    Standing Expiration Date:   11/16/2020    Order Specific Question:   Reason for Exam (SYMPTOM  OR DIAGNOSIS REQUIRED)    Answer:   screening    Order  Specific Question:   Is the patient pregnant?    Answer:   No    Order Specific Question:   Preferred imaging location?    Answer:   [facility]  . CT CHEST LUNG CA SCREEN LOW DOSE W/O CM    [facility]/epic order/ No covid questions pt aware to wear mask and come alone  [id number]/pt uses cane/no spinal stimulator/no glucose monitor/no body injectors [facility],[place] [phone]    Standing Status:   Future    Standing Expiration Date:   11/16/2020    Order Specific Question:   Reason for Exam (SYMPTOM  OR DIAGNOSIS REQUIRED)    Answer:   screen for lung cancer    Order Specific Question:   Preferred Imaging Location?    Answer:   [id number]    Order Specific Question:   Is patient pregnant?    Answer:   No  . Basic Metabolic Panel  . Lipid Panel  . TSH  . Cologuard  . Ambulatory referral to Sleep Studies    Referral Priority:   Routine    Referral Type:   Consultation    Referral Reason:   Specialty Services Required    Number of  Visits Requested:   1  . Amb Ref to Medical Weight Management    Referral Priority:   Routine    Referral Type:   Consultation    Number of Visits Requested:   1  . Amb Referral to Bariatric Surgery    Referral Priority:   Routine    Referral Type:   Consultation    Number of Visits Requested:   1  . POCT glycosylated hemoglobin (Hb A1C)   Meds ordered this encounter  Medications  . metFORMIN (GLUCOPHAGE) 500 MG tablet    Sig: Take 1 tablet (500 mg total) by mouth 2 (two) times daily with a meal.    Dispense:  60 tablet    Refill:  2  . levothyroxine (SYNTHROID) 50 MCG tablet    Sig: Take 1 tablet (50 mcg total) by mouth daily. 30 mins-1 hour prior to a meal    Dispense:  90 tablet    Refill:  0    DX Code Needed  .  . lansoprazole (PREVACID) 30 MG capsule    Sig: TAKE 1 CAPSULE BY MOUTH EVERY DAY    Dispense:  90 capsule    Refill:  1  . allopurinol (ZYLOPRIM) 100 MG tablet    Sig: Take 1 tablet (100 mg total) by mouth daily.     Dispense:  90 tablet    Refill:  3    DX Code Needed  .   Mina Marble, DO PGY-3, [facility] 11/17/2019 4:10 PM

## 2019-11-17 NOTE — Patient Instructions (Addendum)
It was a pleasure to see you today!  Thank you for choosing [facility] for your primary care.  [name] was seen for follow up.   Our plans for today were:  Pre-diabetes: Please start Metformin 500mg . Take 1 tablet twice a day with a meal. If you are tolerating well after 1-2 weeks, you may increase to 2-tablets twice a day  I have referred you to the sleep center for a sleep study to evaluate for sleep apnea. Please expect a call from them to schedule.  I have also referred you to Medical Weight Management clinic. Please expect a call from them to schedule an appointment.  I have placed a order for your mammogram for your beast cancer screen. Please call to schedule at earliest convenience  I have place order for Cologuard (colon cancer screen). Please complete as instructed and return in Pre-stamped box. I will call you with the results when they return  I have gotten some labs. I will call you with the results if they are abnormal.   To keep you healthy, please keep in mind the following health maintenance items that you are due for:   1. Mammogram for breast cancer screen - order placed. Call to schedule 2. Colon cancer screen - Cologuard provided. Please complete as instructed 3. Flu vaccine - given today 4. Pap-smear 5. Lung cancer screen - CT scan ordered. We will let you know the date it is scheduled   You should return to our clinic in 3 months for follow up.   Best Wishes,   Mina Marble, DO

## 2019-11-17 NOTE — Assessment & Plan Note (Signed)
Patient endorses difficulty breathing out of her nose at night. Intolerant to nasal strips. History of nasal fracture that she feels has led to abnormality to her anatomy. Desires referral to ENT. - Will refer for sleep study first as I suspect OSA/OHS is contributing to this - If persists can consider referral to ENT

## 2019-11-17 NOTE — Assessment & Plan Note (Signed)
GERD symptoms well controlled with Prevacid.  - refill provided

## 2019-11-17 NOTE — Assessment & Plan Note (Signed)
Morbid obesity and snoring. Wears oxygen at night for hypoxia.  - referral for sleep study

## 2019-11-17 NOTE — Assessment & Plan Note (Signed)
Tobacco use: Smoked 3/4 pack/day x 36 years (27 pack year history). Does qualify for lung cancer screen at this time. Patient was counseled on the risks of tobacco use and cessation strongly encouraged. - Low dose CT scan ordered and scheduled

## 2019-11-17 NOTE — Assessment & Plan Note (Signed)
Lipid panel obtained. Continue Crestor. Will increase if LDL>100

## 2019-11-17 NOTE — Assessment & Plan Note (Signed)
Continues to gain weight, now with BMI >40. Difficult for patient to exercise. Interested in referral for weight management and bariatric surgery. - referral to weight management clinic and bariatric surgery - recommended lifsteyle changes, focused on activity and healthy eating including heart healthy/Mediterranean diet, with whole grains, fruits, vegetable, fish, lean meats, nuts, and olive oil. Limit salt. -recommend moderate walking, 3-5 times/week for 30-50 minutes each session. Aim for at least 150 minutes.week. Goal should be pace of 3 miles/hours, or walking 1.5 miles in 30 minutes -recommend avoidance of tobacco products. Avoid excess alcohol.

## 2019-11-17 NOTE — Assessment & Plan Note (Signed)
Chronic.  TSH obtained today Has been out of Synthroid 37mcg x 5 day. Refill provided. Follow up TSH and adjust if needed

## 2019-11-17 NOTE — Assessment & Plan Note (Signed)
Normotensive. Well controlled with Metoprolol 50mg  BID.  Prediabetes status with worsening A1C and morbid obesity. Chronic tobacco user. Would likely benefit from ACE/ARB in the future.

## 2019-11-17 NOTE — Assessment & Plan Note (Signed)
Well controlled with Allopurinol 100mg  QD - refill provided

## 2019-11-18 LAB — LIPID PANEL
Chol/HDL Ratio: 6.1 ratio — ABNORMAL HIGH (ref 0.0–4.4)
Cholesterol, Total: 230 mg/dL — ABNORMAL HIGH (ref 100–199)
HDL: 38 mg/dL — ABNORMAL LOW (ref >39)
LDL Chol Calc (NIH): 132 mg/dL — ABNORMAL HIGH (ref 0–99)
Triglycerides: 332 mg/dL — ABNORMAL HIGH (ref 0–149)
VLDL Cholesterol Cal: 60 mg/dL — ABNORMAL HIGH (ref 5–40)

## 2019-11-18 LAB — BASIC METABOLIC PANEL
BUN/Creatinine Ratio: 13 (ref 9–23)
BUN: 17 mg/dL (ref 6–24)
CO2: 27 mmol/L (ref 20–29)
Calcium: 9.2 mg/dL (ref 8.7–10.2)
Chloride: 99 mmol/L (ref 96–106)
Creatinine, Ser: 1.33 mg/dL — ABNORMAL HIGH (ref 0.57–1.00)
GFR calc Af Amer: 50 mL/min/{1.73_m2} — ABNORMAL LOW (ref >59)
GFR calc non Af Amer: 44 mL/min/{1.73_m2} — ABNORMAL LOW (ref >59)
Glucose: 101 mg/dL — ABNORMAL HIGH (ref 65–99)
Potassium: 5 mmol/L (ref 3.5–5.2)
Sodium: 139 mmol/L (ref 134–144)

## 2019-11-18 LAB — TSH: TSH: 4.51 u[IU]/mL — ABNORMAL HIGH (ref 0.450–4.500)

## 2019-11-18 NOTE — Addendum Note (Signed)
Addended by: [name] on: 11/18/2019 06:08 PM   Modules accepted: Orders

## 2019-11-20 ENCOUNTER — Other Ambulatory Visit: Payer: Self-pay | Admitting: Family Medicine

## 2019-11-20 DIAGNOSIS — E039 Hypothyroidism, unspecified: Secondary | ICD-10-CM

## 2019-11-20 DIAGNOSIS — N1832 Chronic kidney disease, stage 3b: Secondary | ICD-10-CM

## 2019-11-20 MED ORDER — LEVOTHYROXINE SODIUM 75 MCG PO TABS: 75.0000 ug | ORAL_TABLET | ORAL | 3 refills | Status: DC

## 2019-11-30 ENCOUNTER — Inpatient Hospital Stay: Admission: RE | Admit: 2019-11-30 | Payer: Medicare HMO | Source: Ambulatory Visit

## 2019-12-20 ENCOUNTER — Inpatient Hospital Stay: Admission: RE | Admit: 2019-12-20 | Payer: Medicare HMO | Source: Ambulatory Visit

## 2020-01-05 ENCOUNTER — Ambulatory Visit: Payer: Medicare HMO

## 2020-01-11 ENCOUNTER — Inpatient Hospital Stay: Admission: RE | Admit: 2020-01-11 | Payer: Medicare HMO | Source: Ambulatory Visit

## 2020-01-12 ENCOUNTER — Other Ambulatory Visit: Payer: Medicare HMO

## 2020-02-10 ENCOUNTER — Other Ambulatory Visit: Payer: Self-pay | Admitting: Family Medicine

## 2020-02-10 ENCOUNTER — Inpatient Hospital Stay: Admission: RE | Admit: 2020-02-10 | Payer: PRIVATE HEALTH INSURANCE | Source: Ambulatory Visit

## 2020-02-10 DIAGNOSIS — R7303 Prediabetes: Secondary | ICD-10-CM

## 2020-02-23 ENCOUNTER — Ambulatory Visit (INDEPENDENT_AMBULATORY_CARE_PROVIDER_SITE_OTHER): Payer: Self-pay | Admitting: Bariatrics

## 2020-02-29 ENCOUNTER — Other Ambulatory Visit: Payer: Self-pay | Admitting: Cardiology

## 2020-03-03 ENCOUNTER — Ambulatory Visit: Payer: PRIVATE HEALTH INSURANCE

## 2020-03-06 ENCOUNTER — Other Ambulatory Visit: Payer: Self-pay | Admitting: Cardiology

## 2020-03-07 ENCOUNTER — Other Ambulatory Visit: Payer: Self-pay | Admitting: *Deleted

## 2020-03-07 MED ORDER — ROSUVASTATIN CALCIUM 20 MG PO TABS: 20.0000 mg | ORAL_TABLET | Freq: Every day | ORAL | 3 refills | Status: DC

## 2020-03-08 ENCOUNTER — Ambulatory Visit (INDEPENDENT_AMBULATORY_CARE_PROVIDER_SITE_OTHER): Payer: Self-pay | Admitting: Bariatrics

## 2020-03-08 ENCOUNTER — Encounter: Payer: Self-pay | Admitting: Cardiology

## 2020-03-08 DIAGNOSIS — M546 Pain in thoracic spine: Secondary | ICD-10-CM | POA: Diagnosis not present

## 2020-03-08 DIAGNOSIS — M961 Postlaminectomy syndrome, not elsewhere classified: Secondary | ICD-10-CM | POA: Diagnosis not present

## 2020-03-20 ENCOUNTER — Other Ambulatory Visit: Payer: Self-pay | Admitting: Cardiology

## 2020-05-18 ENCOUNTER — Other Ambulatory Visit: Payer: Self-pay

## 2020-05-18 ENCOUNTER — Ambulatory Visit (INDEPENDENT_AMBULATORY_CARE_PROVIDER_SITE_OTHER): Payer: PRIVATE HEALTH INSURANCE

## 2020-05-18 VITALS — BP 102/58 | HR 76 | Ht 64.0 in | Wt 232.0 lb

## 2020-05-18 DIAGNOSIS — Z23 Encounter for immunization: Secondary | ICD-10-CM

## 2020-05-18 DIAGNOSIS — Z0000 Encounter for general adult medical examination without abnormal findings: Secondary | ICD-10-CM
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
## 2020-05-18 NOTE — Patient Instructions (Signed)
You spoke to Dorna Bloom, [place] for your annual wellness visit.  We discussed goals: Goals    . Quit Smoking     1-800-QUIT-NOW line provided       We also discussed recommended health maintenance. As discussed, you are due for the following. Health maintenance items can be addressed at upcoming PCP apt.   Health Maintenance  Topic Date Due  . COLON CANCER SCREENING ANNUAL FOBT  Never done  . MAMMOGRAM  10/07/2013  . PAP SMEAR-Modifier  09/22/2017  . INFLUENZA VACCINE  08/21/2020  . TETANUS/TDAP  06/13/2021  . COVID-19 Vaccine  Completed  . Hepatitis C Screening  Completed  . HIV Screening  Completed  . HPV VACCINES  Aged Out  . COLONOSCOPY (Pts 45-64yrs Insurance coverage will need to be confirmed)  Discontinued   Start walking the trails with your husband! PCP apt scheduled for 5/16 @3 :50pm. You got your Covid Booster today! Fill out an advance directive.  We also discussed smoking cessation.  1-800-QUIT-NOW  Health Maintenance, Female Adopting a healthy lifestyle and getting preventive care are important in promoting health and wellness. Ask your health care provider about:  The right schedule for you to have regular tests and exams.  Things you can do on your own to prevent diseases and keep yourself healthy. What should I know about diet, weight, and exercise? Eat a healthy diet  Eat a diet that includes plenty of vegetables, fruits, low-fat dairy products, and lean protein.  Do not eat a lot of foods that are high in solid fats, added sugars, or sodium.   Maintain a healthy weight Body mass index (BMI) is used to identify weight problems. It estimates body fat based on height and weight. Your health care provider can help determine your BMI and help you achieve or maintain a healthy weight. Get regular exercise Get regular exercise. This is one of the most important things you can do for your health. Most adults should:  Exercise for at least 150 minutes each  week. The exercise should increase your heart rate and make you sweat (moderate-intensity exercise).  Do strengthening exercises at least twice a week. This is in addition to the moderate-intensity exercise.  Spend less time sitting. Even light physical activity can be beneficial. Watch cholesterol and blood lipids Have your blood tested for lipids and cholesterol at 60 years of age, then have this test every 5 years. Have your cholesterol levels checked more often if:  Your lipid or cholesterol levels are high.  You are older than 60 years of age.  You are at high risk for heart disease. What should I know about cancer screening? Depending on your health history and family history, you may need to have cancer screening at various ages. This may include screening for:  Breast cancer.  Cervical cancer.  Colorectal cancer.  Skin cancer.  Lung cancer. What should I know about heart disease, diabetes, and high blood pressure? Blood pressure and heart disease  High blood pressure causes heart disease and increases the risk of stroke. This is more likely to develop in people who have high blood pressure readings, are of African descent, or are overweight.  Have your blood pressure checked: ? Every 3-5 years if you are 48-77 years of age. ? Every year if you are 76 years old or older. Diabetes Have regular diabetes screenings. This checks your fasting blood sugar level. Have the screening done:  Once every three years after age 78 if you  are at a normal weight and have a low risk for diabetes.  More often and at a younger age if you are overweight or have a high risk for diabetes. What should I know about preventing infection? Hepatitis B If you have a higher risk for hepatitis B, you should be screened for this virus. Talk with your health care provider to find out if you are at risk for hepatitis B infection. Hepatitis C Testing is recommended for:  Everyone born from [birth year]  through 1965.  Anyone with known risk factors for hepatitis C. Sexually transmitted infections (STIs)  Get screened for STIs, including gonorrhea and chlamydia, if: ? You are sexually active and are younger than 60 years of age. ? You are older than 60 years of age and your health care provider tells you that you are at risk for this type of infection. ? Your sexual activity has changed since you were last screened, and you are at increased risk for chlamydia or gonorrhea. Ask your health care provider if you are at risk.  Ask your health care provider about whether you are at high risk for HIV. Your health care provider may recommend a prescription medicine to help prevent HIV infection. If you choose to take medicine to prevent HIV, you should first get tested for HIV. You should then be tested every 3 months for as long as you are taking the medicine. Pregnancy  If you are about to stop having your period (premenopausal) and you may become pregnant, seek counseling before you get pregnant.  Take 400 to 800 micrograms (mcg) of folic acid every day if you become pregnant.  Ask for birth control (contraception) if you want to prevent pregnancy. Osteoporosis and menopause Osteoporosis is a disease in which the bones lose minerals and strength with aging. This can result in bone fractures. If you are 46 years old or older, or if you are at risk for osteoporosis and fractures, ask your health care provider if you should:  Be screened for bone loss.  Take a calcium or vitamin D supplement to lower your risk of fractures.  Be given hormone replacement therapy (HRT) to treat symptoms of menopause. Follow these instructions at home: Lifestyle  Do not use any products that contain nicotine or tobacco, such as cigarettes, e-cigarettes, and chewing tobacco. If you need help quitting, ask your health care provider.  Do not use street drugs.  Do not share needles.  Ask your health care  provider for help if you need support or information about quitting drugs. Alcohol use  Do not drink alcohol if: ? Your health care provider tells you not to drink. ? You are pregnant, may be pregnant, or are planning to become pregnant.  If you drink alcohol: ? Limit how much you use to 0-1 drink a day. ? Limit intake if you are breastfeeding.  Be aware of how much alcohol is in your drink. In the [place] one drink equals one 12 oz bottle of beer (355 mL), one 5 oz glass of wine (148 mL), or one 1 oz glass of hard liquor (44 mL). General instructions  Schedule regular health, dental, and eye exams.  Stay current with your vaccines.  Tell your health care provider if: ? You often feel depressed. ? You have ever been abused or do not feel safe at home. Summary  Adopting a healthy lifestyle and getting preventive care are important in promoting health and wellness.  Follow your health care provider's instructions  about healthy diet, exercising, and getting tested or screened for diseases.  Follow your health care provider's instructions on monitoring your cholesterol and blood pressure. This information is not intended to replace advice given to you by your health care provider. Make sure you discuss any questions you have with your health care provider. Document Revised: 12/31/2017 Document Reviewed: 12/31/2017 Elsevier Patient Education  2021 [employer].  Here is an example of what a healthy plate looks like:    ? Make half your plate fruits and vegetables.     ? Focus on whole fruits.     ? Vary your veggies.  ? Make half your grains whole grains. -     ? Look for the word "whole" at the beginning of the ingredients list    ? Some whole-grain ingredients include whole oats, whole-wheat flour,        whole-grain corn, whole-grain brown rice, and whole rye.  ? Move to low-fat and fat-free milk or yogurt.  ? Vary your protein routine. - Meat, fish, poultry (chicken,  [place]), eggs, beans (kidney, pinto), dairy.  ? Drink and eat less sodium, saturated fat, and added sugars.   Our clinic's number is [phone]. Please call with questions or concerns about what we discussed today.

## 2020-06-01 ENCOUNTER — Ambulatory Visit (INDEPENDENT_AMBULATORY_CARE_PROVIDER_SITE_OTHER): Payer: Self-pay | Admitting: Family Medicine

## 2020-06-01 DIAGNOSIS — R0602 Shortness of breath: Secondary | ICD-10-CM

## 2020-06-01 DIAGNOSIS — Z0289 Encounter for other administrative examinations: Secondary | ICD-10-CM

## 2020-06-01 DIAGNOSIS — R5383 Other fatigue: Secondary | ICD-10-CM

## 2020-06-03 NOTE — Progress Notes (Deleted)
Subjective:   Patient ID: [name]    DOB: Dec 08, 1960, 60 y.o. female   MRN: [id number]  [name] is a 60 y.o. female with a history of HTN, allergic rhinitis, peptic ulcer, hypothyroidism, lumbar radiculopathy, ankylosing spondylitis, osteoporosis, scoliosis, spondylolisthesis, abnormal PAP, CKD III, coagulopathy, anxiety/depression, gout, HLD, insomnia, leg swelling, morbid obesity, palpitations, prediabetes, RA, snoring, vitamin d deficiency here for check up  ***UPDATE MEDS***  Diabetes: Last three A1C's below. Currently on Metformin 500mg  BID. Endorses compliance. Notes CBGs range ***. Denies any hypoglycemia. Denies any polyuria, polydipsia, polyphagia. Due for ***. Dexcom: document in plan "A1C ***. Currently monitoring and taking >3 injection per day". Initial RX send message to RN team. Refills - just send to pharmacy "Patient is a good candidate for a CGM device given history of hypoglycemia and using 3 insulin injections per day."  - Continuous Blood Gluc Receiver (DEXCOM G6 RECEIVER) DEVI  - Continuous Blood Gluc Sensor (DEXCOM G6 SENSOR) MISC  - Continuous Blood Gluc Transmit (DEXCOM G6 TRANSMITTER) MISC Lantus/Novolug/Humalog Kwikpens/GLP-1 pens - still need to order BD Insulin Pen Needle (B-D UF III MINI PEN NEEDLES) 31G X 5 MM MISC  Lab Results  Component Value Date   HGBA1C 6.3 (A) 11/17/2019   HGBA1C 5.9 (H) 12/04/2017   HGBA1C 5.9 08/23/2016    HTN:    today. Currently on Metoprolol XL 50mg  BID, . Endorses compliance. Non-smoker/Current everyday smoker***. Denies any chest pain, SOB, vision changes, or headaches.   Lab Results  Component Value Date   CREATININE 1.33 (H) 11/17/2019   CREATININE 1.19 (H) 12/04/2017   CREATININE 1.04 (H) 09/12/2016    HLD: Last lipid panel below. Currently on Crestor 20mg  QD. Endorses compliance. Denies any muscles aches or weakness. The 10-year ASCVD risk score Mikey Bussing DC Jr., et al., 2013) is: 8%   Lab Results   Component Value Date   CHOL 230 (H) 11/17/2019   HDL 38 (L) 11/17/2019   LDLCALC 132 (H) 11/17/2019   LDLDIRECT 174 (H) 09/06/2013   TRIG 332 (H) 11/17/2019   CHOLHDL 6.1 (H) 11/17/2019   Hypothyroidism: Last TSH below. Currently on Synthroid 63mcg daily. Endorses compliance. Denies unexplained weight changes, skin chances, worsening anxiety, difficulty sleeping, diarrhea/constipation.  Lab Results  Component Value Date   TSH 4.510 (H) 11/17/2019   TSH 2.330 12/04/2017   TSH 1.650 08/23/2016   Health Maintenance: Health Maintenance Due  Topic  . COLON CANCER SCREENING ANNUAL FOBT   . MAMMOGRAM   . PAP SMEAR-Modifier    Review of Systems:  Per HPI.   Objective:   LMP 05/17/2009 (LMP Unknown)  Vitals and nursing note reviewed.  General: pleasant ***, sitting comfortably in exam chair, well nourished, well developed, in no acute distress with non-toxic appearance HEENT: normocephalic, atraumatic, moist mucous membranes, oropharynx clear without erythema or exudate, TM normal bilaterally  Neck: supple, non-tender without lymphadenopathy CV: regular rate and rhythm without murmurs, rubs, or gallops, no lower extremity edema, 2+ radial and pedal pulses bilaterally Lungs: clear to auscultation bilaterally with normal work of breathing on room air Resp: breathing comfortably on room air, speaking in full sentences Abdomen: soft, non-tender, non-distended, no masses or organomegaly palpable, normoactive bowel sounds Skin: warm, dry, no rashes or lesions Extremities: warm and well perfused, normal tone MSK: ROM grossly intact, strength intact, gait normal Neuro: Alert and oriented, speech normal  Assessment & Plan:   No problem-specific Assessment & Plan notes found for this encounter.  No orders of  the defined types were placed in this encounter.  No orders of the defined types were placed in this encounter.  Tobacco Use: Chronic. Has smoked about 3/4 PPD x 36 years (27  pack year history). - CT chest ordered but never completed. Strongly encouraged to complete.  - strongly encouraged cessation - Nicotine replacement ***  History of Vitamin D Deficiency Last Vitamin D level 21 in 2016. Level today to monitor Encouraged to take daily Vitamin D3 1000IU  Health Maintenance: - mammogram ordered - encouraged to schedule  - colon cancer screen - never completed cologuard as instructed. Discussed***  {    This will disappear when note is signed, click to select method of visit    :1}  Mina Marble, DO PGY-3, [facility] 06/03/2020 1:34 PM

## 2020-06-05 ENCOUNTER — Ambulatory Visit: Payer: PRIVATE HEALTH INSURANCE | Admitting: Family Medicine

## 2020-06-05 DIAGNOSIS — I10 Essential (primary) hypertension: Secondary | ICD-10-CM

## 2020-06-05 DIAGNOSIS — N1831 Chronic kidney disease, stage 3a: Secondary | ICD-10-CM

## 2020-06-05 DIAGNOSIS — Z720 Tobacco use: Secondary | ICD-10-CM

## 2020-06-05 DIAGNOSIS — E039 Hypothyroidism, unspecified: Secondary | ICD-10-CM

## 2020-06-05 DIAGNOSIS — E782 Mixed hyperlipidemia: Secondary | ICD-10-CM

## 2020-06-05 DIAGNOSIS — R7303 Prediabetes: Secondary | ICD-10-CM

## 2020-06-05 DIAGNOSIS — E559 Vitamin D deficiency, unspecified: Secondary | ICD-10-CM

## 2020-06-13 ENCOUNTER — Telehealth: Payer: Self-pay | Admitting: Cardiology

## 2020-06-13 NOTE — Telephone Encounter (Signed)
STAT if patient feels like he/she is going to faint   1) Are you dizzy now? Yes  2) Do you feel faint or have you passed out? Yes  3) Do you have any other symptoms? Nausea, fallen twice in past week; said arms was tingling and could feel legs. Everything turned black.  4) Have you checked your HR and BP (record if available)? No

## 2020-06-13 NOTE — Telephone Encounter (Signed)
Spoke to patient. She was calling to arrange an appointment for her and her husband Danisha Brassfield. She states she has been having some almost passing out spells .  She states it occurs abut everyday. She states she has fallen x 2 in the last week or more. She had episode last night.   No chest pain ,some nausea, she states she has been having some diarrhea past few weeks. Per patient no slurred speech, no loss of bladder or bowels, no weakness of either side,no facial drooping.  She has not taken blood pressure or heart rate . Does not have any equipment at home. Patient states she and husband needs to checked out .   RN informed patient she wil need to be evaluated by her primary for these symptoms-- but an appointment has been set up for patient 06/16/20 at 2:45 pm with [place] [facility]. Patient had requested afternoon appointments.  Patient verbalized she will contact primary for symptoms  Feeling weak , dizziness, fallen. Aware of appointment.

## 2020-06-15 ENCOUNTER — Ambulatory Visit (INDEPENDENT_AMBULATORY_CARE_PROVIDER_SITE_OTHER): Payer: Self-pay | Admitting: Family Medicine

## 2020-07-03 ENCOUNTER — Other Ambulatory Visit: Payer: Self-pay | Admitting: Family Medicine

## 2020-07-03 DIAGNOSIS — K219 Gastro-esophageal reflux disease without esophagitis: Secondary | ICD-10-CM

## 2020-07-17 ENCOUNTER — Ambulatory Visit: Payer: PRIVATE HEALTH INSURANCE | Admitting: Physician Assistant

## 2020-09-08 ENCOUNTER — Other Ambulatory Visit: Payer: Self-pay | Admitting: Cardiology

## 2020-10-22 ENCOUNTER — Other Ambulatory Visit: Payer: Self-pay | Admitting: Cardiovascular Disease

## 2020-10-27 NOTE — Telephone Encounter (Signed)
Rx e-sent to pharmacy ( will need an office appointment for additional refills

## 2020-11-15 ENCOUNTER — Other Ambulatory Visit: Payer: Self-pay | Admitting: Family Medicine

## 2020-11-15 DIAGNOSIS — M1A0720 Idiopathic chronic gout, left ankle and foot, without tophus (tophi): Secondary | ICD-10-CM

## 2020-11-15 DIAGNOSIS — E039 Hypothyroidism, unspecified: Secondary | ICD-10-CM

## 2020-11-15 DIAGNOSIS — R7303 Prediabetes: Secondary | ICD-10-CM

## 2020-11-29 ENCOUNTER — Other Ambulatory Visit: Payer: Self-pay | Admitting: Cardiology

## 2020-12-01 ENCOUNTER — Telehealth: Payer: Self-pay

## 2020-12-01 NOTE — Telephone Encounter (Signed)
Patient calls nurse line requesting antibiotic for dental abscess. Reports she woke up with swelling and pain in left lower tooth. Reports that she only has three bottom teeth and is supposed to be having procedure for dental plate in the future. Reports that dental office has closed down and she has to establish care at new office.   Advised patient that she would likely need an appointment prior to abx being prescribed. Offered same day appointment for this afternoon, however, patient does not have transportation to come into the office.   ED/UC precautions provided to patient.   Talbot Grumbling, RN

## 2020-12-01 NOTE — Telephone Encounter (Signed)
Patient returns call to nurse line, stating that she missed phone call from provider.   Patient reports that she now has phone plugged up and is available anytime.   Talbot Grumbling, RN

## 2020-12-04 NOTE — Telephone Encounter (Signed)
Patient should be seen by medical provider as soon as possible for evaluation. Can use [facility], please give number for transport.  Gladys Damme, MD [facility], PGY-3

## 2020-12-10 ENCOUNTER — Other Ambulatory Visit: Payer: Self-pay | Admitting: Cardiology

## 2020-12-19 ENCOUNTER — Other Ambulatory Visit: Payer: Self-pay | Admitting: Cardiology

## 2020-12-22 ENCOUNTER — Other Ambulatory Visit: Payer: Self-pay | Admitting: Family Medicine

## 2020-12-22 DIAGNOSIS — L304 Erythema intertrigo: Secondary | ICD-10-CM

## 2020-12-24 ENCOUNTER — Other Ambulatory Visit: Payer: Self-pay | Admitting: Family Medicine

## 2020-12-24 DIAGNOSIS — K219 Gastro-esophageal reflux disease without esophagitis: Secondary | ICD-10-CM

## 2020-12-25 ENCOUNTER — Ambulatory Visit (INDEPENDENT_AMBULATORY_CARE_PROVIDER_SITE_OTHER): Payer: Medicare Other | Admitting: Family Medicine

## 2020-12-25 ENCOUNTER — Encounter: Payer: Self-pay | Admitting: Family Medicine

## 2020-12-25 ENCOUNTER — Other Ambulatory Visit: Payer: Self-pay

## 2020-12-25 VITALS — BP 120/89 | HR 67 | Ht 63.0 in | Wt 219.0 lb

## 2020-12-25 DIAGNOSIS — E039 Hypothyroidism, unspecified: Secondary | ICD-10-CM | POA: Diagnosis not present

## 2020-12-25 DIAGNOSIS — Z23 Encounter for immunization: Secondary | ICD-10-CM

## 2020-12-25 DIAGNOSIS — I951 Orthostatic hypotension: Secondary | ICD-10-CM | POA: Diagnosis not present

## 2020-12-25 DIAGNOSIS — R42 Dizziness and giddiness: Secondary | ICD-10-CM | POA: Diagnosis not present

## 2020-12-25 NOTE — Patient Instructions (Signed)
It was great seeing you today!  Today we discussed your dizziness. This seems to be due to a combination of nerve pain and posture changes. Please make sure to drink plenty of water to stay hydrated and change positions very slowly. We will also get blood work to make sure nothing further is causing this dizziness, I will notify you of any abnormal results. Please make sure to also follow up with your neurosurgeon as well.   Please follow up at your next scheduled appointment in 1 month, if anything arises between now and then, please don't hesitate to contact our office.   Thank you for allowing [place] to be a part of your medical care!  Thank you, Dr. Larae Grooms

## 2020-12-25 NOTE — Assessment & Plan Note (Addendum)
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

## 2020-12-25 NOTE — Progress Notes (Signed)
    SUBJECTIVE:   CHIEF COMPLAINT / HPI:   Presents with dizziness with onset of 6 months ago, she mainly experience when she is laying on her back. She usually lays on her side which helps her feel better. Has had multiple surgeries as she follows up with a neurosurgeon. Last saw neurosurgeon in about 4-5 years but follows up with pain management routinely currently. Endorsing vision changes when this occurs, sometimes sees black which resolves within 20 minutes, sometimes sooner. Occurs most times when she changes positions, primarily if she is on her back so it occurs almost nightly. Denies any new medications. Denies tinnitus but has had this intermittently for years well before this new dizziness developed.   OBJECTIVE:   BP 120/89   Pulse 67   Ht 5\' 3"  (1.6 m)   Wt 219 lb (99.3 kg)   LMP 05/17/2009 (LMP Unknown)   SpO2 97%   BMI 38.79 kg/m   General: Patient well-appearing, in no acute distress. 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

## 2020-12-26 LAB — BASIC METABOLIC PANEL
BUN/Creatinine Ratio: 15 (ref 12–28)
BUN: 15 mg/dL (ref 8–27)
CO2: 19 mmol/L — ABNORMAL LOW (ref 20–29)
Calcium: 8.9 mg/dL (ref 8.7–10.3)
Chloride: 103 mmol/L (ref 96–106)
Creatinine, Ser: 1 mg/dL (ref 0.57–1.00)
Glucose: 124 mg/dL — ABNORMAL HIGH (ref 70–99)
Potassium: 4.8 mmol/L (ref 3.5–5.2)
Sodium: 143 mmol/L (ref 134–144)
eGFR: 64 mL/min/{1.73_m2} (ref >59)

## 2020-12-26 LAB — CBC
Hematocrit: 45.1 % (ref 34.0–46.6)
Hemoglobin: 14.5 g/dL (ref 11.1–15.9)
MCH: 30.3 pg (ref 26.6–33.0)
MCHC: 32.2 g/dL (ref 31.5–35.7)
MCV: 94 fL (ref 79–97)
Platelets: 336 10*3/uL (ref 150–450)
RBC: 4.79 x10E6/uL (ref 3.77–5.28)
RDW: 13.5 % (ref 11.7–15.4)
WBC: 11.1 10*3/uL — ABNORMAL HIGH (ref 3.4–10.8)

## 2020-12-26 LAB — TSH: TSH: 1.44 u[IU]/mL (ref 0.450–4.500)

## 2021-01-09 ENCOUNTER — Other Ambulatory Visit: Payer: Self-pay | Admitting: Cardiology

## 2021-01-11 ENCOUNTER — Other Ambulatory Visit: Payer: Self-pay | Admitting: Family Medicine

## 2021-01-11 DIAGNOSIS — R7303 Prediabetes: Secondary | ICD-10-CM

## 2021-01-12 NOTE — Telephone Encounter (Signed)
Patient needs appointment for Prediabetes with [facility] physician before further refills

## 2021-02-06 ENCOUNTER — Ambulatory Visit: Payer: No Typology Code available for payment source | Admitting: Family Medicine

## 2021-02-11 ENCOUNTER — Other Ambulatory Visit: Payer: Self-pay | Admitting: Family Medicine

## 2021-02-11 DIAGNOSIS — M1A0720 Idiopathic chronic gout, left ankle and foot, without tophus (tophi): Secondary | ICD-10-CM

## 2021-02-20 DIAGNOSIS — M961 Postlaminectomy syndrome, not elsewhere classified: Secondary | ICD-10-CM | POA: Diagnosis not present

## 2021-02-20 DIAGNOSIS — M546 Pain in thoracic spine: Secondary | ICD-10-CM | POA: Diagnosis not present

## 2021-02-20 DIAGNOSIS — M25561 Pain in right knee: Secondary | ICD-10-CM | POA: Diagnosis not present

## 2021-02-20 DIAGNOSIS — M542 Cervicalgia: Secondary | ICD-10-CM | POA: Diagnosis not present

## 2021-02-23 ENCOUNTER — Other Ambulatory Visit: Payer: Self-pay | Admitting: Family Medicine

## 2021-02-23 DIAGNOSIS — E039 Hypothyroidism, unspecified: Secondary | ICD-10-CM

## 2021-02-27 ENCOUNTER — Other Ambulatory Visit: Payer: Self-pay

## 2021-02-27 MED ORDER — ROSUVASTATIN CALCIUM 20 MG PO TABS: 20.0000 mg | ORAL_TABLET | Freq: Every day | ORAL | 3 refills | Status: DC

## 2021-02-28 ENCOUNTER — Telehealth: Payer: Self-pay | Admitting: Cardiology

## 2021-02-28 NOTE — Telephone Encounter (Signed)
Pt c/o of Chest Pain: STAT if CP now or developed within 24 hours  1. Are you having CP right now? Yes. Patient said the pain is like someone is hitting her sternum with an axe and her chest is cracked in two. The pain also radiates to her spine on the opposite side.   2. Are you experiencing any other symptoms (ex. SOB, nausea, vomiting, sweating)? Sweating, nauseated but no vomiting  3. How long have you been experiencing CP? Since yesterday  4. Is your CP continuous or coming and going? continuous  5. Have you taken Nitroglycerin? No. Patient did take aspirin two hours ago  Patient is recovering from pneumonia. She is also a pain patient and took all of her regular pain medication and it did not dull the pain. She is not sure what to do

## 2021-02-28 NOTE — Telephone Encounter (Signed)
Received a call from patient she stated she has been having chest pain off and on all day.Stated she recently had pneumonia and has 2 more days of antibiotics.Stated she is presently having mid sternal chest pain radiating into back and under left arm.Rates pain # 8 to # 9.Advised to call 911 and go to ED to be evaluated.I will make Dr.Harding aware.

## 2021-03-01 NOTE — Telephone Encounter (Signed)
Looks like she did not go to the ER.  I have not seen her in almost 2-1/2 years.  Probably related to pneumonia.   Glenetta Hew, MD

## 2021-03-05 ENCOUNTER — Emergency Department (HOSPITAL_COMMUNITY)
Admission: EM | Admit: 2021-03-05 | Discharge: 2021-03-05 | Disposition: A | Discharge status: Home / Self Care | Payer: Medicare Other | Attending: Emergency Medicine | Admitting: Emergency Medicine

## 2021-03-05 ENCOUNTER — Emergency Department (HOSPITAL_COMMUNITY): Payer: Medicare Other

## 2021-03-05 ENCOUNTER — Encounter (HOSPITAL_COMMUNITY): Payer: Self-pay | Admitting: Emergency Medicine

## 2021-03-05 ENCOUNTER — Other Ambulatory Visit: Payer: Self-pay

## 2021-03-05 DIAGNOSIS — Z91040 Latex allergy status: Secondary | ICD-10-CM | POA: Diagnosis not present

## 2021-03-05 DIAGNOSIS — R10816 Epigastric abdominal tenderness: Secondary | ICD-10-CM | POA: Diagnosis not present

## 2021-03-05 DIAGNOSIS — R0789 Other chest pain: Secondary | ICD-10-CM | POA: Diagnosis not present

## 2021-03-05 DIAGNOSIS — Z7982 Long term (current) use of aspirin: Secondary | ICD-10-CM | POA: Insufficient documentation

## 2021-03-05 DIAGNOSIS — Z79899 Other long term (current) drug therapy: Secondary | ICD-10-CM | POA: Diagnosis not present

## 2021-03-05 DIAGNOSIS — R142 Eructation: Secondary | ICD-10-CM | POA: Diagnosis not present

## 2021-03-05 DIAGNOSIS — Z7984 Long term (current) use of oral hypoglycemic drugs: Secondary | ICD-10-CM | POA: Insufficient documentation

## 2021-03-05 DIAGNOSIS — R079 Chest pain, unspecified: Secondary | ICD-10-CM | POA: Diagnosis not present

## 2021-03-05 LAB — BASIC METABOLIC PANEL
Anion gap: 9 (ref 5–15)
BUN: 9 mg/dL (ref 6–20)
CO2: 25 mmol/L (ref 22–32)
Calcium: 8.7 mg/dL — ABNORMAL LOW (ref 8.9–10.3)
Chloride: 103 mmol/L (ref 98–111)
Creatinine, Ser: 1.23 mg/dL — ABNORMAL HIGH (ref 0.44–1.00)
GFR, Estimated: 50 mL/min — ABNORMAL LOW (ref >60)
Glucose, Bld: 129 mg/dL — ABNORMAL HIGH (ref 70–99)
Potassium: 4.6 mmol/L (ref 3.5–5.1)
Sodium: 137 mmol/L (ref 135–145)

## 2021-03-05 LAB — CBC
HCT: 45.6 % (ref 36.0–46.0)
Hemoglobin: 14.3 g/dL (ref 12.0–15.0)
MCH: 31.2 pg (ref 26.0–34.0)
MCHC: 31.4 g/dL (ref 30.0–36.0)
MCV: 99.3 fL (ref 80.0–100.0)
Platelets: 300 10*3/uL (ref 150–400)
RBC: 4.59 MIL/uL (ref 3.87–5.11)
RDW: 15.8 % — ABNORMAL HIGH (ref 11.5–15.5)
WBC: 8 10*3/uL (ref 4.0–10.5)
nRBC: 0 % (ref 0.0–0.2)

## 2021-03-05 LAB — TROPONIN I (HIGH SENSITIVITY)
Troponin I (High Sensitivity): 4 ng/L (ref <18)
Troponin I (High Sensitivity): 3 ng/L (ref <18)

## 2021-03-05 MED ORDER — PANTOPRAZOLE SODIUM 40 MG PO TBEC: 40.0000 mg | DELAYED_RELEASE_TABLET | Freq: Every day | ORAL | 3 refills | Status: DC

## 2021-03-05 MED ORDER — ALUM & MAG HYDROXIDE-SIMETH 200-200-20 MG/5ML PO SUSP
30.0000 mL | Freq: Once | ORAL | Status: AC
  Administered 2021-03-05: 30 mL via ORAL
  Filled 2021-03-05: qty 30

## 2021-03-05 MED ORDER — ACETAMINOPHEN 500 MG PO TABS
1000.0000 mg | ORAL_TABLET | Freq: Once | ORAL | Status: DC
  Filled 2021-03-05: qty 2

## 2021-03-05 MED ORDER — ONDANSETRON 4 MG PO TBDP
8.0000 mg | ORAL_TABLET | Freq: Once | ORAL | Status: AC
  Administered 2021-03-05: 8 mg via ORAL
  Filled 2021-03-05: qty 2

## 2021-03-05 MED ORDER — LIDOCAINE VISCOUS HCL 2 % MT SOLN
15.0000 mL | Freq: Once | OROMUCOSAL | Status: AC
  Administered 2021-03-05: 15 mL via ORAL
  Filled 2021-03-05: qty 15

## 2021-03-05 MED ORDER — CLONIDINE HCL 0.1 MG PO TABS
0.1000 mg | ORAL_TABLET | Freq: Once | ORAL | Status: AC
  Administered 2021-03-05: 0.1 mg via ORAL
  Filled 2021-03-05: qty 1

## 2021-03-05 NOTE — ED Provider Notes (Signed)
[facility] EMERGENCY DEPARTMENT Provider Note   CSN: [id number] Arrival date & time: 03/05/21  0102     History  Chief Complaint  Patient presents with   Chest Pain    [name] is a 61 y.o. female.  Patient presents to the emergency department for evaluation of chest discomfort.  Symptoms have been ongoing for 4 or 5 days continuously.  She reports that 2 days ago the symptoms eased up but did not resolve.  In the last 2 days pain has worsened.  She has had burning sensation in the chest and under the ribs with increased belching.  She does have a history of GERD.  No known cardiac history.      Home Medications Prior to Admission medications   Medication Sig Start Date End Date Taking? Authorizing Provider  allopurinol (ZYLOPRIM) 100 MG tablet Take 1 tablet (100 mg total) by mouth daily. Needs appt for future refills. 02/12/21   Gladys Damme, MD  Aromatic Inhalants [facility] VAPOINHALER) INHA One to two inhalations into each nostril at bedtime    [provider]  aspirin EC 81 MG tablet Take 81 mg by mouth at bedtime. STOP PRIOR TO PROCEDURE    [provider]  baclofen (LIORESAL) 10 MG tablet Take 1 tablet (10 mg total) by mouth 2 (two) times daily as needed for muscle spasms. Patient not taking: Reported on 05/18/2020 08/02/16   Dickie La, MD  carboxymethylcellulose (REFRESH PLUS) 0.5 % SOLN 2 drops 3 (three) times daily as needed.    [provider]  colchicine 0.6 MG tablet TAKE 1 TABLET BY MOUTH EVERY DAY AS NEEDED FOR GOUT FLARES 08/11/19   Mullis, Kiersten P, DO  diphenhydrAMINE (BENADRYL) 25 MG tablet Take 25 mg by mouth at bedtime as needed for allergies or sleep.    [provider]  furosemide (LASIX) 20 MG tablet Take 1 tablet (20 mg total) by mouth daily as needed for fluid or edema. 12/03/18 03/03/19  Leonie Man, MD  gabapentin (NEURONTIN) 300 MG capsule Take 1 capsule (300 mg total) by mouth at bedtime.  07/05/17   Verner Mould, MD  HYDROcodone-acetaminophen (NORCO/VICODIN) 5-325 MG tablet Take 1-2 tablets by mouth every 4 (four) hours as needed (breakthrough pain). 09/18/16   Kristeen Miss, MD  lansoprazole (PREVACID) 30 MG capsule TAKE 1 CAPSULE BY MOUTH EVERY DAY 12/25/20   McDiarmid, Blane Ohara, MD  levothyroxine (SYNTHROID) 75 MCG tablet TAKE 1 TABLET (75 MCG TOTAL) BY MOUTH DAILY BEFORE BREAKFAST. NEED APPT FOR MORE REFILLS. 02/24/21   Gladys Damme, MD  metFORMIN (GLUCOPHAGE) 500 MG tablet TAKE 1 TABLET (500 MG TOTAL) BY MOUTH 2 (TWO) TIMES DAILY WITH A MEAL. NEED APPT FOR MORE REFILLS. 01/12/21   McDiarmid, Blane Ohara, MD  methocarbamol (ROBAXIN) 500 MG tablet Take 1 tablet (500 mg total) by mouth 2 (two) times daily. 08/06/16   Kinnie Feil, PA-C  metoprolol succinate (TOPROL-XL) 50 MG 24 hr tablet TAKE 1 TABLET BY MOUTH TWICE A DAY 09/08/20   Leonie Man, MD  metoprolol tartrate (LOPRESSOR) 25 MG tablet TAKE 1 TABLET (25 MG TOTAL) BY MOUTH AS NEEDED. NEED OV. 12/19/20   Leonie Man, MD  MOVANTIK 25 MG TABS tablet Take 25 mg by mouth daily. 08/28/15   [provider]  naproxen (NAPROSYN) 500 MG tablet Take 1 tablet (500 mg total) by mouth 2 (two) times daily. Patient not taking: Reported on 05/18/2020 08/06/16   Carmon Sails  J, PA-C  nystatin cream (MYCOSTATIN) APPLY TO AFFECTED AREA TWICE A DAY 12/25/20   McDiarmid, Blane Ohara, MD  OXYGEN Inhale 2 L into the lungs at bedtime.    [provider]  rosuvastatin (CRESTOR) 20 MG tablet Take 1 tablet (20 mg total) by mouth daily. 02/27/21 05/28/21  Gladys Damme, MD  traZODone (DESYREL) 50 MG tablet TAKE 1 TABLET BY MOUTH AT BEDTIME AS NEEDED FOR SLEEP 09/11/17   Charlcie Cradle, MD      Allergies    Codeine, Aleve [naproxen], Latex, Lipitor [atorvastatin], Nyquil severe cold-flu [phenyleph-doxylamine-dm-apap], Tape, Tylenol [acetaminophen], and Acyclovir and related    Review of Systems   Review of Systems   Cardiovascular:  Positive for chest pain.   Physical Exam Updated Vital Signs BP 137/84    Pulse 60    Temp 97.7 F (36.5 C) (Oral)    Resp 11    LMP 05/17/2009 (LMP Unknown)    SpO2 94%  Physical Exam Vitals and nursing note reviewed.  Constitutional:      General: She is not in acute distress.    Appearance: She is well-developed.  HENT:     Head: Normocephalic and atraumatic.     Mouth/Throat:     Mouth: Mucous membranes are moist.  Eyes:     General: Vision grossly intact. Gaze aligned appropriately.     Extraocular Movements: Extraocular movements intact.     Conjunctiva/sclera: Conjunctivae normal.  Cardiovascular:     Rate and Rhythm: Normal rate and regular rhythm.     Pulses: Normal pulses.     Heart sounds: Normal heart sounds, S1 normal and S2 normal. No murmur heard.   No friction rub. No gallop.  Pulmonary:     Effort: Pulmonary effort is normal. No respiratory distress.     Breath sounds: Normal breath sounds.  Abdominal:     General: Bowel sounds are normal.     Palpations: Abdomen is soft.     Tenderness: There is abdominal tenderness in the epigastric area. There is no guarding or rebound.     Hernia: No hernia is present.  Musculoskeletal:        General: No swelling.     Cervical back: Full passive range of motion without pain, normal range of motion and neck supple. No spinous process tenderness or muscular tenderness. Normal range of motion.     Right lower leg: No edema.     Left lower leg: No edema.  Skin:    General: Skin is warm and dry.     Capillary Refill: Capillary refill takes less than 2 seconds.     Findings: No ecchymosis, erythema, rash or wound.  Neurological:     General: No focal deficit present.     Mental Status: She is alert and oriented to person, place, and time.     GCS: GCS eye subscore is 4. GCS verbal subscore is 5. GCS motor subscore is 6.     Cranial Nerves: Cranial nerves 2-12 are intact.     Sensory: Sensation is  intact.     Motor: Motor function is intact.     Coordination: Coordination is intact.  Psychiatric:        Attention and Perception: Attention normal.        Mood and Affect: Mood normal.        Speech: Speech normal.        Behavior: Behavior normal.    ED Results / Procedures / Treatments   Labs (  all labs ordered are listed, but only abnormal results are displayed) Labs Reviewed  BASIC METABOLIC PANEL - Abnormal; Notable for the following components:      Result Value   Glucose, Bld 129 (*)    Creatinine, Ser 1.23 (*)    Calcium 8.7 (*)    GFR, Estimated 50 (*)    All other components within normal limits  CBC - Abnormal; Notable for the following components:   RDW 15.8 (*)    All other components within normal limits  TROPONIN I (HIGH SENSITIVITY)  TROPONIN I (HIGH SENSITIVITY)    EKG EKG Interpretation  Date/Time:  Monday March 05 2021 02:35:58 EST Ventricular Rate:  70 PR Interval:  152 QRS Duration: 74 QT Interval:  394 QTC Calculation: 425 R Axis:   9 Text Interpretation: Sinus rhythm with Premature atrial complexes with Abberant conduction Low voltage QRS Septal infarct , age undetermined Abnormal ECG No significant change since last tracing Confirmed by Orpah Greek [phone]) on 03/05/2021 3:13:24 AM  Radiology DG Chest 2 View  Result Date: 03/05/2021 CLINICAL DATA:  Chest pain EXAM: CHEST - 2 VIEW COMPARISON:  06/01/2015 FINDINGS: Heart and mediastinal contours are within normal limits. No focal opacities or effusions. No acute bony abnormality. IMPRESSION: No active cardiopulmonary disease. Electronically Signed   By: Rolm Baptise M.D.   On: 03/05/2021 03:26    Procedures Procedures    Medications Ordered in ED Medications  acetaminophen (TYLENOL) tablet 1,000 mg (1,000 mg Oral Patient Refused/Not Given 03/05/21 0440)  ondansetron (ZOFRAN-ODT) disintegrating tablet 8 mg (8 mg Oral Given 03/05/21 0436)  alum & mag hydroxide-simeth  (MAALOX/MYLANTA) 200-200-20 MG/5ML suspension 30 mL (30 mLs Oral Given 03/05/21 0436)    And  lidocaine (XYLOCAINE) 2 % viscous mouth solution 15 mL (15 mLs Oral Given 03/05/21 0436)  cloNIDine (CATAPRES) tablet 0.1 mg (0.1 mg Oral Given 03/05/21 0435)    ED Course/ Medical Decision Making/ A&P                           Medical Decision Making Amount and/or Complexity of Data Reviewed Labs: ordered. Radiology: ordered.  Risk OTC drugs. Prescription drug management.   Patient presents to the emergency department for evaluation of chest pain.  Patient has been having pain continuously for days.  Patient reports a burning sensation in the chest and upper abdomen with increased belching.  She does have a history of GERD.  Differential diagnosis would be GI chest pain versus cardiac chest pain.  Patient has had cardiac work-up in the past.  Nuclear medicine stress test in 2017 was negative.  Symptoms today are atypical.  EKG without ischemic changes.  Troponin negative x2.  Blood pressure was elevated on arrival, has been treated and improved.  Patient has been thoroughly worked up, does not have any positive findings.  Patient does not require hospitalization at this time, can follow-up with PCP and/or cardiology.        Final Clinical Impression(s) / ED Diagnoses Final diagnoses:  Chest pain, unspecified type    Rx / DC Orders ED Discharge Orders     None         Solene Hereford, Gwenyth Allegra, MD 03/05/21 [id number]

## 2021-03-05 NOTE — ED Triage Notes (Signed)
Pt c/o worsening chest pain that radiates to her back, nausea, and shortness of breath. States she had a similar episode last week, but symptoms resolved.

## 2021-03-06 ENCOUNTER — Other Ambulatory Visit: Payer: Self-pay | Admitting: Cardiology

## 2021-03-09 ENCOUNTER — Telehealth: Payer: Self-pay | Admitting: Cardiology

## 2021-03-09 NOTE — Telephone Encounter (Signed)
Patient called stating she went to the ER the other day.  The told her she had acid reflux and prescribed her medication for it.  She states she already takes medication for it.  They did lab work, EKG, chest x-ray's.  She states her BP is still high.  She hasn't check her BP recently.  She is complaining of feeling nauseous and having a headache today.

## 2021-03-09 NOTE — Telephone Encounter (Signed)
Spoke with patient who reports going to the ed Monday morning for chest pain with a bp of 176/117 in the ed. She hasn't been monitoring bp at home; needs batteries. Recommended that she monitor her bp daily and keep a record of it; and to let us know if it remains elevated. Also, she has GERD and was prescribed pantoprazole. She has March appointment with Arnold Long, FNP.

## 2021-03-20 ENCOUNTER — Other Ambulatory Visit: Payer: Self-pay | Admitting: Family Medicine

## 2021-03-20 DIAGNOSIS — M1A0720 Idiopathic chronic gout, left ankle and foot, without tophus (tophi): Secondary | ICD-10-CM
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
## 2021-04-13 ENCOUNTER — Other Ambulatory Visit: Payer: Self-pay

## 2021-04-13 ENCOUNTER — Other Ambulatory Visit: Payer: Self-pay | Admitting: Family Medicine

## 2021-04-13 ENCOUNTER — Ambulatory Visit (INDEPENDENT_AMBULATORY_CARE_PROVIDER_SITE_OTHER): Payer: Medicare Other | Admitting: Adult Health

## 2021-04-13 ENCOUNTER — Encounter: Payer: Self-pay | Admitting: Adult Health

## 2021-04-13 VITALS — BP 122/68 | HR 72 | Ht 63.0 in | Wt 210.2 lb

## 2021-04-13 DIAGNOSIS — I10 Essential (primary) hypertension: Secondary | ICD-10-CM

## 2021-04-13 DIAGNOSIS — R079 Chest pain, unspecified: Secondary | ICD-10-CM

## 2021-04-13 DIAGNOSIS — E7800 Pure hypercholesterolemia, unspecified: Secondary | ICD-10-CM

## 2021-04-13 DIAGNOSIS — M797 Fibromyalgia: Secondary | ICD-10-CM

## 2021-04-13 DIAGNOSIS — E0590 Thyrotoxicosis, unspecified without thyrotoxic crisis or storm: Secondary | ICD-10-CM

## 2021-04-13 DIAGNOSIS — K279 Peptic ulcer, site unspecified, unspecified as acute or chronic, without hemorrhage or perforation: Secondary | ICD-10-CM | POA: Diagnosis not present

## 2021-04-13 DIAGNOSIS — Z720 Tobacco use: Secondary | ICD-10-CM | POA: Diagnosis not present

## 2021-04-13 DIAGNOSIS — M1A0720 Idiopathic chronic gout, left ankle and foot, without tophus (tophi): Secondary | ICD-10-CM

## 2021-04-13 MED ORDER — METOPROLOL TARTRATE 25 MG PO TABS: 25.0000 mg | ORAL_TABLET | ORAL | 1 refills | Status: DC | PRN

## 2021-04-13 NOTE — Patient Instructions (Signed)
Medication Instructions:  ?No Changes ?*If you need a refill on your cardiac medications before your next appointment, please call your pharmacy* ? ? ?Lab Work: ?No Labs ?If you have labs (blood work) drawn today and your tests are completely normal, you will receive your results only by: ?MyChart Message (if you have MyChart) OR ?A paper copy in the mail ?If you have any lab test that is abnormal or we need to change your treatment, we will call you to review the results. ? ? ?Testing/Procedures: ? ? ?Your cardiac CT will be scheduled at one of the below locations:  ? ?[facility] ?[address] ?[place], [place] [address] ?[phone] ? ? ? ?If scheduled at [facility], please arrive at the [facility] and Children's Entrance (Entrance C2) of [facility] 30 minutes prior to test start time. ?You can use the FREE valet parking offered at entrance C (encouraged to control the heart rate for the test)  ?Proceed to the [facility] Radiology Department (first floor) to check-in and test prep. ? ?All radiology patients and guests should use entrance C2 at [facility], accessed from [facility], even though the hospital's physical address listed is [address]. ? ? ? ?If scheduled at [facility], please arrive 15 mins early for check-in and test prep. ? ?Please follow these instructions carefully (unless otherwise directed): ? ?Hold all erectile dysfunction medications at least 3 days (72 hrs) prior to test. ? ?On the Night Before the Test: ?Be sure to Drink plenty of water. ?Do not consume any caffeinated/decaffeinated beverages or chocolate 12 hours prior to your test. ?Do not take any antihistamines 12 hours prior to your test. ?If the patient has contrast allergy: ?Patient will need a prescription for Prednisone and very clear instructions (as follows): ?Prednisone 50 mg - take 13 hours prior to test ?Take another Prednisone 50 mg 7  hours prior to test ?Take another Prednisone 50 mg 1 hour prior to test ?Take Benadryl 50 mg 1 hour prior to test ?Patient must complete all four doses of above prophylactic medications. ?Patient will need a ride after test due to Benadryl. ? ?On the Day of the Test: ?Drink plenty of water until 1 hour prior to the test. ?Do not eat any food 4 hours prior to the test. ?You may take your regular medications prior to the test.  ?Take metoprolol (Lopressor) two hours prior to test. ?HOLD Furosemide/Hydrochlorothiazide morning of the test. ?FEMALES- please wear underwire-free bra if available, avoid dresses & tight clothing ? ? ?*For Clinical Staff only. Please instruct patient the following:* ?Heart Rate Medication Recommendations for Cardiac CT  ?Resting HR < 50 bpm  ?No medication  ?Resting HR 50-60 bpm and BP >110/50 mmHG   ?Consider Metoprolol tartrate 25 mg PO 90-120 min prior to scan  ?Resting HR 60-65 bpm and BP >110/50 mmHG  ?Metoprolol tartrate 50 mg PO 90-120 minutes prior to scan   ?Resting HR > 65 bpm and BP >110/50 mmHG  ?Metoprolol tartrate 100 mg PO 90-120 minutes prior to scan  ?Consider Ivabradine 10-15 mg PO or a calcium channel blocker for resting HR >60 bpm and contraindication to metoprolol tartrate  ?Consider Ivabradine 10-15 mg PO in combination with metoprolol tartrate for HR >80 bpm   ? ?     ?After the Test: ?Drink plenty of water. ?After receiving IV contrast, you may experience a mild flushed feeling. This is normal. ?On occasion, you  may experience a mild rash up to 24 hours after the test. This is not dangerous. If this occurs, you can take Benadryl 25 mg and increase your fluid intake. ?If you experience trouble breathing, this can be serious. If it is severe call 911 IMMEDIATELY. If it is mild, please call our office. ?If you take any of these medications: Glipizide/Metformin, Avandament, Glucavance, please do not take 48 hours after completing test unless otherwise instructed. ? ?We  will call to schedule your test 2-4 weeks out understanding that some insurance companies will need an authorization prior to the service being performed.  ? ?For non-scheduling related questions, please contact the cardiac imaging nurse navigator should you have any questions/concerns: ?Marchia Bond, Cardiac Imaging Nurse Navigator ?Gordy Clement, Cardiac Imaging Nurse Navigator ?[facility] ?Direct Office Dial: [phone]  ? ?For scheduling needs, including cancellations and rescheduling, please call [place], [phone].  ? ? ?Follow-Up: ?At [facility], you and your health needs are our priority.  As part of our continuing mission to provide you with exceptional heart care, we have created designated Provider Care Teams.  These Care Teams include your primary Cardiologist (physician) and Advanced Practice Providers (APPs -  Physician Assistants and Nurse Practitioners) who all work together to provide you with the care you need, when you need it. ? ?We recommend signing up for the patient portal called "MyChart".  Sign up information is provided on this After Visit Summary.  MyChart is used to connect with patients for Virtual Visits (Telemedicine).  Patients are able to view lab/test results, encounter notes, upcoming appointments, etc.  Non-urgent messages can be sent to your provider as well.   ?To learn more about what you can do with MyChart, go to [email or web address].   ? ?Your next appointment:   ?1 month(s) ? ?The format for your next appointment:   ?In Person ? ?Provider:   ?Jory Sims, DNP, ANP   or Dr. Glenetta Hew, MD ? ?1}  ? ? ? ?

## 2021-04-16 ENCOUNTER — Telehealth: Payer: Self-pay | Admitting: Cardiology

## 2021-04-16 NOTE — Telephone Encounter (Signed)
04/16/21 ? ?Office received FMLA forms. Forms put in [place] box.  ?*pt will need to complete Release, Billing form, and $29 (Cash, Check, Money Order) payment* ?

## 2021-04-27 ENCOUNTER — Telehealth (HOSPITAL_COMMUNITY): Payer: Self-pay | Admitting: *Deleted

## 2021-04-27 ENCOUNTER — Other Ambulatory Visit (HOSPITAL_COMMUNITY): Payer: Self-pay | Admitting: *Deleted

## 2021-04-27 NOTE — Telephone Encounter (Signed)
Reaching out to patient to offer assistance regarding upcoming cardiac imaging study; pt verbalizes understanding of appt date/time, parking situation and where to check in, pre-test NPO status and medications ordered, and verified current allergies; name and call back number provided for further questions should they arise ? ?Gordy Clement RN Navigator Cardiac Imaging ?[facility] ?[phone] office ?[phone] cell ? ?Patient to take '25mg'$  metoprolol two hours prior to her cardiac CT scan.  She is aware to arrive at 4pm. ?

## 2021-04-30 ENCOUNTER — Ambulatory Visit (HOSPITAL_COMMUNITY)
Admission: RE | Admit: 2021-04-30 | Discharge: 2021-04-30 | Disposition: A | Discharge status: Home / Self Care | Payer: Medicare Other | Source: Ambulatory Visit | Attending: Adult Health | Admitting: Adult Health

## 2021-05-10 ENCOUNTER — Other Ambulatory Visit (HOSPITAL_COMMUNITY): Payer: Self-pay | Admitting: *Deleted

## 2021-05-10 ENCOUNTER — Telehealth (HOSPITAL_COMMUNITY): Payer: Self-pay | Admitting: *Deleted

## 2021-05-10 DIAGNOSIS — Z01812 Encounter for preprocedural laboratory examination: Secondary | ICD-10-CM

## 2021-05-10 NOTE — Telephone Encounter (Signed)
Attempted to call patient regarding upcoming cardiac CT appointment. °Left message on voicemail with name and callback number ° °Ilisa Hayworth RN Navigator Cardiac Imaging °[facility] °[phone] Office °[phone] Cell ° °

## 2021-05-11 ENCOUNTER — Telehealth (HOSPITAL_COMMUNITY): Payer: Self-pay | Admitting: *Deleted

## 2021-05-11 NOTE — Telephone Encounter (Signed)
Reaching out to patient to offer assistance regarding upcoming cardiac imaging study; pt verbalizes understanding of appt date/time, parking situation and where to check in, pre-test NPO status and medications ordered, and verified current allergies; name and call back number provided for further questions should they arise  Leotis Isham RN Navigator Cardiac Imaging [facility] [phone] office [phone] cell  Patient to take 25mg metoprolol tartrate two hours prior to her cardiac CT scan. She is aware to arrive at 2pm 

## 2021-05-14 ENCOUNTER — Ambulatory Visit (HOSPITAL_COMMUNITY)
Admission: RE | Admit: 2021-05-14 | Discharge: 2021-05-14 | Disposition: A | Discharge status: Home / Self Care | Payer: Medicare Other | Source: Ambulatory Visit | Attending: Adult Health | Admitting: Adult Health

## 2021-05-14 DIAGNOSIS — I2510 Atherosclerotic heart disease of native coronary artery without angina pectoris: Secondary | ICD-10-CM | POA: Insufficient documentation

## 2021-05-14 DIAGNOSIS — R079 Chest pain, unspecified: Secondary | ICD-10-CM | POA: Diagnosis not present

## 2021-05-14 LAB — POCT I-STAT CREATININE: Creatinine, Ser: 1.2 mg/dL — ABNORMAL HIGH (ref 0.44–1.00)

## 2021-05-14 MED ORDER — NITROGLYCERIN 0.4 MG SL SUBL
0.8000 mg | SUBLINGUAL_TABLET | Freq: Once | SUBLINGUAL | Status: AC
  Administered 2021-05-14: 0.8 mg via SUBLINGUAL

## 2021-05-14 MED ORDER — IOHEXOL 350 MG/ML SOLN
95.0000 mL | Freq: Once | INTRAVENOUS | Status: AC | PRN
  Administered 2021-05-14: 95 mL via INTRAVENOUS

## 2021-05-14 MED ORDER — NITROGLYCERIN 0.4 MG SL SUBL
SUBLINGUAL_TABLET | SUBLINGUAL | Status: AC
  Filled 2021-05-14: qty 2

## 2021-05-15 ENCOUNTER — Telehealth: Payer: Self-pay

## 2021-05-15 NOTE — Telephone Encounter (Addendum)
Called patient regarding results. Left message for patient to call office.----- Message from Lendon Colonel, NP sent at 05/15/2021 10:55 AM EDT ----- ?I have reviewed her CT scan results. She has minimal coronary artery disease which is non obstructive. This is good news. She has multiple risk factors especially, smoking. She is advised to stop smoking, continue her rosuvastatin, and adhere to diabetic diet.   She will have annual follow ups with cardiology.  ? ?KL ?

## 2021-05-16 ENCOUNTER — Telehealth: Payer: Self-pay

## 2021-05-16 NOTE — Telephone Encounter (Addendum)
Patient returned call regarding results. Patient had understanding results.----- Message from Lendon Colonel, NP sent at 05/14/2021  7:47 PM EDT ----- ?Creatinine okay for test.  ? ?KL ?

## 2021-05-16 NOTE — Telephone Encounter (Addendum)
Patient called regarding results. Patient had understanding of results.----- Message from Lendon Colonel, NP sent at 05/14/2021  7:49 PM EDT ----- ?CT scan shows CAD, but will wait for the coronary CTA results before commenting. ? ?KL ?

## 2021-05-16 NOTE — Telephone Encounter (Signed)
-----   Message from Lendon Colonel, NP sent at 05/14/2021  7:49 PM EDT ----- ?CT scan shows CAD, but will wait for the coronary CTA results before commenting. ? ?KL ?
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
## 2021-05-18 ENCOUNTER — Encounter: Payer: Self-pay | Admitting: Adult Health

## 2021-05-18 ENCOUNTER — Ambulatory Visit: Payer: Medicare Other | Admitting: Adult Health

## 2021-05-18 VITALS — BP 120/80 | HR 73 | Ht 63.0 in | Wt 209.4 lb

## 2021-05-18 DIAGNOSIS — R079 Chest pain, unspecified: Secondary | ICD-10-CM

## 2021-05-18 DIAGNOSIS — M797 Fibromyalgia: Secondary | ICD-10-CM

## 2021-05-18 DIAGNOSIS — E7800 Pure hypercholesterolemia, unspecified: Secondary | ICD-10-CM

## 2021-05-18 DIAGNOSIS — Z720 Tobacco use: Secondary | ICD-10-CM

## 2021-05-18 DIAGNOSIS — I10 Essential (primary) hypertension: Secondary | ICD-10-CM | POA: Diagnosis not present

## 2021-05-18 NOTE — Patient Instructions (Addendum)
Medication Instructions:  Your physician recommends that you continue on your current medications as directed. Please refer to the Current Medication list given to you today.   *If you need a refill on your cardiac medications before your next appointment, please call your pharmacy*   Lab Work: NONE ordered at this time of appointment   If you have labs (blood work) drawn today and your tests are completely normal, you will receive your results only by: MyChart Message (if you have MyChart) OR A paper copy in the mail If you have any lab test that is abnormal or we need to change your treatment, we will call you to review the results.   Testing/Procedures: NONE ordered at this time of appointment     Follow-Up: At [facility], you and your health needs are our priority.  As part of our continuing mission to provide you with exceptional heart care, we have created designated Provider Care Teams.  These Care Teams include your primary Cardiologist (physician) and Advanced Practice Providers (APPs -  Physician Assistants and Nurse Practitioners) who all work together to provide you with the care you need, when you need it.  We recommend signing up for the patient portal called "MyChart".  Sign up information is provided on this After Visit Summary.  MyChart is used to connect with patients for Virtual Visits (Telemedicine).  Patients are able to view lab/test results, encounter notes, upcoming appointments, etc.  Non-urgent messages can be sent to your provider as well.   To learn more about what you can do with MyChart, go to [email or web address].    Your next appointment:   6 month(s)  The format for your next appointment:   In Person  Provider:   David Harding, MD     Other Instructions                                                                     Important Information About Sugar       

## 2021-05-25 DIAGNOSIS — M961 Postlaminectomy syndrome, not elsewhere classified: Secondary | ICD-10-CM | POA: Diagnosis not present

## 2021-05-28 ENCOUNTER — Other Ambulatory Visit: Payer: Self-pay | Admitting: Cardiology

## 2021-06-27 ENCOUNTER — Other Ambulatory Visit: Payer: Self-pay | Admitting: Family Medicine

## 2021-06-27 DIAGNOSIS — K219 Gastro-esophageal reflux disease without esophagitis: Secondary | ICD-10-CM

## 2021-07-09 ENCOUNTER — Other Ambulatory Visit: Payer: Self-pay | Admitting: Family Medicine

## 2021-07-09 DIAGNOSIS — M1A0720 Idiopathic chronic gout, left ankle and foot, without tophus (tophi): Secondary | ICD-10-CM

## 2021-07-25 DIAGNOSIS — M542 Cervicalgia: Secondary | ICD-10-CM | POA: Diagnosis not present

## 2021-07-25 DIAGNOSIS — M4316 Spondylolisthesis, lumbar region: Secondary | ICD-10-CM | POA: Diagnosis not present

## 2021-07-25 DIAGNOSIS — M5412 Radiculopathy, cervical region: Secondary | ICD-10-CM | POA: Diagnosis not present

## 2021-07-25 DIAGNOSIS — G959 Disease of spinal cord, unspecified: Secondary | ICD-10-CM | POA: Diagnosis not present

## 2021-07-26 ENCOUNTER — Other Ambulatory Visit: Payer: Self-pay | Admitting: Neurological Surgery

## 2021-07-26 DIAGNOSIS — G959 Disease of spinal cord, unspecified: Secondary | ICD-10-CM

## 2021-08-01 DIAGNOSIS — M4316 Spondylolisthesis, lumbar region: Secondary | ICD-10-CM | POA: Diagnosis not present

## 2021-08-01 DIAGNOSIS — M5412 Radiculopathy, cervical region: Secondary | ICD-10-CM | POA: Diagnosis not present

## 2021-08-01 DIAGNOSIS — M542 Cervicalgia: Secondary | ICD-10-CM | POA: Diagnosis not present

## 2021-08-13 ENCOUNTER — Ambulatory Visit
Admission: RE | Admit: 2021-08-13 | Discharge: 2021-08-13 | Disposition: A | Discharge status: Home / Self Care | Payer: Medicare Other | Source: Ambulatory Visit | Attending: Neurological Surgery | Admitting: Neurological Surgery

## 2021-08-13 DIAGNOSIS — M542 Cervicalgia: Secondary | ICD-10-CM | POA: Diagnosis not present

## 2021-08-13 DIAGNOSIS — G959 Disease of spinal cord, unspecified: Secondary | ICD-10-CM

## 2021-08-22 DIAGNOSIS — G959 Disease of spinal cord, unspecified: Secondary | ICD-10-CM | POA: Diagnosis not present

## 2021-08-29 ENCOUNTER — Encounter (INDEPENDENT_AMBULATORY_CARE_PROVIDER_SITE_OTHER): Payer: Self-pay

## 2021-10-23 ENCOUNTER — Ambulatory Visit: Payer: Medicare Other | Attending: Cardiology | Admitting: Cardiology

## 2021-11-07 DIAGNOSIS — M5412 Radiculopathy, cervical region: Secondary | ICD-10-CM | POA: Diagnosis not present

## 2021-11-07 DIAGNOSIS — M542 Cervicalgia: Secondary | ICD-10-CM | POA: Diagnosis not present

## 2021-11-07 DIAGNOSIS — M4316 Spondylolisthesis, lumbar region: Secondary | ICD-10-CM | POA: Diagnosis not present

## 2021-12-10 ENCOUNTER — Other Ambulatory Visit: Payer: Self-pay

## 2021-12-10 MED ORDER — PANTOPRAZOLE SODIUM 40 MG PO TBEC: 40.0000 mg | DELAYED_RELEASE_TABLET | Freq: Every day | ORAL | 3 refills | Status: DC

## 2021-12-10 MED ORDER — COLCHICINE 0.6 MG PO TABS: ORAL_TABLET | ORAL | 5 refills | Status: DC

## 2021-12-27 ENCOUNTER — Other Ambulatory Visit: Payer: Self-pay | Admitting: Family Medicine

## 2021-12-27 DIAGNOSIS — R7303 Prediabetes: Secondary | ICD-10-CM

## 2021-12-27 NOTE — Progress Notes (Deleted)
    SUBJECTIVE:   CHIEF COMPLAINT / HPI: med refill  ***  PERTINENT  PMH / PSH: HTN, Hypothyroidism, Ankylosing spondylitis, Gout, Depression, Tobacco use, GAD  OBJECTIVE:   LMP 05/17/2009 (LMP Unknown)   ***  ASSESSMENT/PLAN:   No problem-specific Assessment & Plan notes found for this encounter.     Salvadore Oxford, MD [place]

## 2021-12-27 NOTE — Patient Instructions (Incomplete)
It was great to see you! Thank you for allowing me to participate in your care!  I recommend that you always bring your medications to each appointment as this makes it easy to ensure we are on the correct medications and helps [place] not miss when refills are needed.  Our plans for today:  - *** -   We are checking some labs today, I will call you if they are abnormal will send you a MyChart message or a letter if they are normal.  If you do not hear about your labs in the next 2 weeks please let us know.***  Take care and seek immediate care sooner if you develop any concerns.   Dr. Salvadore Oxford, MD [place]

## 2021-12-28 ENCOUNTER — Ambulatory Visit: Payer: Medicare Other | Admitting: Family Medicine

## 2022-01-09 NOTE — Patient Instructions (Incomplete)
It was great to see you! Thank you for allowing me to participate in your care!  I recommend that you always bring your medications to each appointment as this makes it easy to ensure we are on the correct medications and helps [place] not miss when refills are needed.  Our plans for today:  -Stop taking Lasix -Please make appointment with your cardiologist -I have refilled your allopurinol and sent to your pharmacy.  We are checking some labs today, I will call you if they are abnormal will send you a MyChart message or a letter if they are normal.  If you do not hear about your labs in the next 2 weeks please let us know.  Take care and seek immediate care sooner if you develop any concerns.   Dr. Salvadore Oxford, MD [place]

## 2022-01-09 NOTE — Progress Notes (Signed)
    SUBJECTIVE:   CHIEF COMPLAINT / HPI: Medication follow-up  Patient presents today to go over medications and get refills.  She denies any current specific problems. Discussed that she feels like her gout is flaring up but does not have a specific spot that is red or inflamed.  Has not had to use colchicine but did run out of allopurinol 1 month ago.  Patient brought medications to clinic, extensively reviewed and updated in chart.  Patient would like paperwork for handicap parking pass due to ankylosing spondylitis.  Patient describes that she is on oxygen at night that is nasal cannula for of a failed sleep study.  She is unsure who prescribes that to her.  There is not documentation of chronic hypoxic respiratory failure in her chart.    PERTINENT  PMH / PSH: HTN, Hypothyroidism, Ankylosing spondylitis,CKD 3, Depression, prediabetes  OBJECTIVE:   BP 118/68   Pulse 68   Wt 204 lb 3.2 oz (92.6 kg)   LMP 05/17/2009 (LMP Unknown)   SpO2 96%   BMI 36.17 kg/m   General: NAD  Neuro: A&O Cardiovascular: RRR, no murmurs, no peripheral edema Respiratory: normal WOB on RA, CTAB, no wheezes, ronchi or rales Extremities: Moving all 4 extremities equally   ASSESSMENT/PLAN:   Essential hypertension Blood pressure is controlled today.  Is not requiring metoprolol or tartrate for palpitations very often. -Continue metoprolol succinate 50 mg twice daily as prescribed by cardiology -Continue metoprolol tartrate 25 mg -Directed to make an appointment with her cardiologist -Consider losartan in the setting of hypertension and gout  Hypothyroidism TSH ordered today.  Prior within normal limits but 61 years old. -Readjust Synthroid home 75 mcg dose as needed  Chronic kidney disease (CKD), stage III (moderate) Last creatinine 1.20 from April 2023.  Not on renally protective medications currently. -CMP today  Prediabetes Patient stopped taking metformin 2 weeks ago.  Does not have recent  A1c. -Repeat A1c -Follow-up after the New Year's to discuss and possibly continue metformin  Gout Patient requires refill of allopurinol 100 mg daily.  No baseline uric acid. -Uric acid ordered -Allopurinol 100 mg daily refilled -Continue colchicine as needed  Hyperlipidemia Patient currently taking Crestor 20 mg daily.  No recent lipid panel. -Lipid panel today -Continue Crestor 20 mg daily, titrate up as needed   Need for immunization against influenza -Flu vaccine today  Patient is established at this clinic, but has not followed up in decent amount of time.  New PCP visit with me.  Discussed following up in less than 1 month.  Time taken today to address medications and follow-up on chronic conditions above.    Salvadore Oxford, MD [place]

## 2022-01-10 ENCOUNTER — Ambulatory Visit (INDEPENDENT_AMBULATORY_CARE_PROVIDER_SITE_OTHER): Payer: Medicare Other | Admitting: Family Medicine

## 2022-01-10 ENCOUNTER — Encounter: Payer: Self-pay | Admitting: Family Medicine

## 2022-01-10 VITALS — BP 118/68 | HR 68 | Wt 204.2 lb

## 2022-01-10 DIAGNOSIS — I10 Essential (primary) hypertension: Secondary | ICD-10-CM

## 2022-01-10 DIAGNOSIS — M1A0720 Idiopathic chronic gout, left ankle and foot, without tophus (tophi): Secondary | ICD-10-CM

## 2022-01-10 DIAGNOSIS — N1831 Chronic kidney disease, stage 3a: Secondary | ICD-10-CM

## 2022-01-10 DIAGNOSIS — E782 Mixed hyperlipidemia: Secondary | ICD-10-CM

## 2022-01-10 DIAGNOSIS — E039 Hypothyroidism, unspecified: Secondary | ICD-10-CM | POA: Diagnosis not present

## 2022-01-10 DIAGNOSIS — Z23 Encounter for immunization: Secondary | ICD-10-CM | POA: Diagnosis not present

## 2022-01-10 DIAGNOSIS — R7303 Prediabetes: Secondary | ICD-10-CM | POA: Diagnosis not present

## 2022-01-10 LAB — POCT GLYCOSYLATED HEMOGLOBIN (HGB A1C): Hemoglobin A1C: 6 % — AB (ref 4.0–5.6)

## 2022-01-10 MED ORDER — ALLOPURINOL 100 MG PO TABS: 100.0000 mg | ORAL_TABLET | Freq: Every day | ORAL | 0 refills | Status: DC

## 2022-01-10 NOTE — Assessment & Plan Note (Signed)
Patient requires refill of allopurinol 100 mg daily.  No baseline uric acid. -Uric acid ordered -Allopurinol 100 mg daily refilled -Continue colchicine as needed

## 2022-01-10 NOTE — Assessment & Plan Note (Signed)
Patient stopped taking metformin 2 weeks ago.  Does not have recent A1c. -Repeat A1c -Follow-up after the New Year's to discuss and possibly continue metformin

## 2022-01-10 NOTE — Assessment & Plan Note (Addendum)
TSH ordered today.  Prior within normal limits but 61 years old. -Readjust Synthroid home 75 mcg dose as needed

## 2022-01-10 NOTE — Assessment & Plan Note (Signed)
Patient currently taking Crestor 20 mg daily.  No recent lipid panel. -Lipid panel today -Continue Crestor 20 mg daily, titrate up as needed

## 2022-01-10 NOTE — Assessment & Plan Note (Addendum)
Blood pressure is controlled today.  Is not requiring metoprolol or tartrate for palpitations very often. -Continue metoprolol succinate 50 mg twice daily as prescribed by cardiology -Continue metoprolol tartrate 25 mg -Directed to make an appointment with her cardiologist -Consider losartan in the setting of hypertension and gout

## 2022-01-10 NOTE — Assessment & Plan Note (Signed)
Last creatinine 1.20 from April 2023.  Not on renally protective medications currently. -CMP today

## 2022-01-11 LAB — COMPREHENSIVE METABOLIC PANEL
ALT: 9 IU/L (ref 0–32)
AST: 13 IU/L (ref 0–40)
Albumin/Globulin Ratio: 1.8 (ref 1.2–2.2)
Albumin: 3.7 g/dL — ABNORMAL LOW (ref 3.9–4.9)
Alkaline Phosphatase: 75 IU/L (ref 44–121)
BUN/Creatinine Ratio: 14 (ref 12–28)
BUN: 15 mg/dL (ref 8–27)
Bilirubin Total: 0.2 mg/dL (ref 0.0–1.2)
CO2: 28 mmol/L (ref 20–29)
Calcium: 9.1 mg/dL (ref 8.7–10.3)
Chloride: 99 mmol/L (ref 96–106)
Creatinine, Ser: 1.11 mg/dL — ABNORMAL HIGH (ref 0.57–1.00)
Globulin, Total: 2.1 g/dL (ref 1.5–4.5)
Glucose: 86 mg/dL (ref 70–99)
Potassium: 4.4 mmol/L (ref 3.5–5.2)
Sodium: 139 mmol/L (ref 134–144)
Total Protein: 5.8 g/dL — ABNORMAL LOW (ref 6.0–8.5)
eGFR: 57 mL/min/{1.73_m2} — ABNORMAL LOW (ref >59)

## 2022-01-11 LAB — LIPID PANEL
Chol/HDL Ratio: 5.4 ratio — ABNORMAL HIGH (ref 0.0–4.4)
Cholesterol, Total: 177 mg/dL (ref 100–199)
HDL: 33 mg/dL — ABNORMAL LOW (ref >39)
LDL Chol Calc (NIH): 104 mg/dL — ABNORMAL HIGH (ref 0–99)
Triglycerides: 230 mg/dL — ABNORMAL HIGH (ref 0–149)
VLDL Cholesterol Cal: 40 mg/dL (ref 5–40)

## 2022-01-11 LAB — CBC
Hematocrit: 41.6 % (ref 34.0–46.6)
Hemoglobin: 13.3 g/dL (ref 11.1–15.9)
MCH: 30.3 pg (ref 26.6–33.0)
MCHC: 32 g/dL (ref 31.5–35.7)
MCV: 95 fL (ref 79–97)
Platelets: 309 10*3/uL (ref 150–450)
RBC: 4.39 x10E6/uL (ref 3.77–5.28)
RDW: 14.4 % (ref 11.7–15.4)
WBC: 7.7 10*3/uL (ref 3.4–10.8)

## 2022-01-11 LAB — TSH RFX ON ABNORMAL TO FREE T4: TSH: 3.54 u[IU]/mL (ref 0.450–4.500)

## 2022-01-11 LAB — URIC ACID: Uric Acid: 5.7 mg/dL (ref 3.0–7.2)

## 2022-01-12 ENCOUNTER — Telehealth: Payer: Self-pay | Admitting: Family Medicine

## 2022-01-12 NOTE — Telephone Encounter (Signed)
Attempted to call patient x 2 to discuss lab results. Left HIPAA-compliant voicemail x 2. Informed patient she can discuss lab results at follow up visit with Dr. Ruben Im on 01/23/2022 or call [facility] office starting on 12/27 f she wishes to discuss results before then.  Colletta Maryland, DO

## 2022-01-16 ENCOUNTER — Telehealth: Payer: Self-pay

## 2022-01-16 NOTE — Telephone Encounter (Signed)
Received completed handicap placard in RN box. Called patient, LVM and informed that form was ready for pick up. Copy made and placed in batch scanning. Original at front desk for pick up.   Talbot Grumbling, RN

## 2022-01-23 ENCOUNTER — Other Ambulatory Visit: Payer: Self-pay | Admitting: Family Medicine

## 2022-01-23 ENCOUNTER — Ambulatory Visit: Payer: Medicare Other | Admitting: Family Medicine

## 2022-01-23 DIAGNOSIS — R7303 Prediabetes: Secondary | ICD-10-CM

## 2022-01-23 NOTE — Progress Notes (Deleted)
    SUBJECTIVE:   CHIEF COMPLAINT / HPI:   HLD - On Crestor.  Oxygen at night?  HM - Mammogram, Colon Cancer, Pap Smear  Vaccines - Covid, Tdap, Shingrix  PERTINENT  PMH / PSH: HTN, Hypothyroidism, Ankylosing spondylitis, CKD 3, Prediabetes  OBJECTIVE:   LMP 05/17/2009 (LMP Unknown)   ***   LDL 104 Cr 1.11 A1c 6.0  ASSESSMENT/PLAN:   No problem-specific Assessment & Plan notes found for this encounter.     Salvadore Oxford, MD [place]

## 2022-01-28 ENCOUNTER — Ambulatory Visit: Payer: Medicare Other | Admitting: Family Medicine

## 2022-01-29 DIAGNOSIS — M542 Cervicalgia: Secondary | ICD-10-CM | POA: Diagnosis not present

## 2022-01-29 DIAGNOSIS — M5412 Radiculopathy, cervical region: Secondary | ICD-10-CM | POA: Diagnosis not present

## 2022-01-29 DIAGNOSIS — M4316 Spondylolisthesis, lumbar region: Secondary | ICD-10-CM | POA: Diagnosis not present

## 2022-01-31 ENCOUNTER — Other Ambulatory Visit: Payer: Self-pay | Admitting: Adult Health

## 2022-02-20 ENCOUNTER — Telehealth: Payer: Self-pay | Admitting: Cardiology

## 2022-02-20 NOTE — Telephone Encounter (Signed)
Pt called to report that she has not been sleeping the last 2 nights and she woke up this morning with nausea and feeling to vomit... she says she has a dull ache in her chest but no dizziness or SOB.   Her BP:  166/98: 154/91   She will try to hydrate without making herself sick... she will monitor her BP now that she has had her meds.   She will call EMS if her chest dull ache worsens.   She will also reach out to her PCP for her nausea.   I strongly urged her to consider the ED if she feels any worse since it may be cardiac precipitated and she did have some CAD on her 04/2021 CT.

## 2022-02-20 NOTE — Telephone Encounter (Signed)
Pt c/o BP issue: STAT if pt c/o blurred vision, one-sided weakness or slurred speech  1. What are your last 5 BP readings? 166/98: 154/91  2. Are you having any other symptoms (ex. Dizziness, headache, blurred vision, passed out)? Havent been able to sleep, sick on the stomach  3. What is your BP issue?
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
## 2022-02-21 NOTE — Telephone Encounter (Signed)
LMTCB

## 2022-02-21 NOTE — Telephone Encounter (Signed)
Sounds like it could also be GI.    Would check with PCP as well if not wanting to go to ER.  [place]

## 2022-02-22 NOTE — Telephone Encounter (Signed)
Spoke with pt and gave her reply from Dr. Ellyn Hack. Pt stated she has appointment with A. Duke on 2/8. Denies nausea at this time. While on phone, BP 191/105, 61. Took medication 30 minutes ago. Reports dull ache in chest and back. Recommended she be taken to the ED. She said she will have her husband drive her to [facility]. She said she hoped she wouldn't have to wait long. Explained to pt about cardiac enzymes and the time frame for those tests.

## 2022-02-28 ENCOUNTER — Ambulatory Visit: Payer: 59 | Admitting: Physician Assistant

## 2022-03-03 ENCOUNTER — Other Ambulatory Visit: Payer: Self-pay | Admitting: Family Medicine

## 2022-03-03 DIAGNOSIS — R7303 Prediabetes: Secondary | ICD-10-CM

## 2022-03-09 ENCOUNTER — Other Ambulatory Visit: Payer: Self-pay | Admitting: Family Medicine
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
## 2022-03-21 ENCOUNTER — Encounter: Payer: Self-pay | Admitting: Physician Assistant

## 2022-03-21 ENCOUNTER — Ambulatory Visit: Payer: 59 | Attending: Student

## 2022-03-21 ENCOUNTER — Ambulatory Visit: Payer: 59 | Attending: Physician Assistant | Admitting: Student

## 2022-03-21 VITALS — BP 118/72 | HR 67 | Ht 63.5 in | Wt 189.0 lb

## 2022-03-21 DIAGNOSIS — R002 Palpitations: Secondary | ICD-10-CM

## 2022-03-21 DIAGNOSIS — I10 Essential (primary) hypertension: Secondary | ICD-10-CM

## 2022-03-21 DIAGNOSIS — I2510 Atherosclerotic heart disease of native coronary artery without angina pectoris: Secondary | ICD-10-CM

## 2022-03-21 DIAGNOSIS — R55 Syncope and collapse: Secondary | ICD-10-CM | POA: Diagnosis not present

## 2022-03-21 MED ORDER — FUROSEMIDE 20 MG PO TABS: 20.0000 mg | ORAL_TABLET | Freq: Every day | ORAL | 4 refills | Status: DC | PRN

## 2022-03-21 MED ORDER — METOPROLOL SUCCINATE ER 50 MG PO TB24: 75.0000 mg | ORAL_TABLET | Freq: Two times a day (BID) | ORAL | 3 refills | Status: DC

## 2022-03-21 NOTE — Patient Instructions (Signed)
Medication Instructions:  Increase Toprol-XL 50 mg ( Take 1.5 Tablets in the Morning, Take 1 Tablet In The Evening). *If you need a refill on your cardiac medications before your next appointment, please call your pharmacy*   Lab Work: No Labs If you have labs (blood work) drawn today and your tests are completely normal, you will receive your results only by: [place] (if you have MyChart) OR A paper copy in the mail If you have any lab test that is abnormal or we need to change your treatment, we will call you to review the results.   Testing/Procedures: [place]- Long Term Monitor Instructions  Your physician has requested you wear a ZIO patch monitor for 14 days.  This is a single patch monitor. [employer] supplies one patch monitor per enrollment. Additional stickers are not available. Please do not apply patch if you will be having a Nuclear Stress Test,  Echocardiogram, Cardiac CT, MRI, or Chest Xray during the period you would be wearing the  monitor. The patch cannot be worn during these tests. You cannot remove and re-apply the  ZIO XT patch monitor.  Your ZIO patch monitor will be mailed 3 day USPS to your address on file. It may take 3-5 days  to receive your monitor after you have been enrolled.  Once you have received your monitor, please review the enclosed instructions. Your monitor  has already been registered assigning a specific monitor serial # to you.  Billing and Patient Assistance Program Information  We have supplied [employer] with any of your insurance information on file for billing purposes. [employer] offers a sliding scale Patient Assistance Program for patients that do not have  insurance, or whose insurance does not completely cover the cost of the ZIO monitor.  You must apply for the Patient Assistance Program to qualify for this discounted rate.  To apply, please call [employer] at [phone], select option 4, select option 2, ask to apply for  Patient  Assistance Program. Theodore Demark will ask your household income, and how many people  are in your household. They will quote your out-of-pocket cost based on that information.  [employer] will also be able to set up a 49-month interest-free payment plan if needed.  Applying the monitor   Shave hair from upper left chest.  Hold abrader disc by orange tab. Rub abrader in 40 strokes over the upper left chest as  indicated in your monitor instructions.  Clean area with 4 enclosed alcohol pads. Let dry.  Apply patch as indicated in monitor instructions. Patch will be placed under collarbone on left  side of chest with arrow pointing upward.  Rub patch adhesive wings for 2 minutes. Remove white label marked "1". Remove the white  label marked "2". Rub patch adhesive wings for 2 additional minutes.  While looking in a mirror, press and release button in center of patch. A small green light will  flash 3-4 times. This will be your only indicator that the monitor has been turned on.  Do not shower for the first 24 hours. You may shower after the first 24 hours.  Press the button if you feel a symptom. You will hear a small click. Record Date, Time and  Symptom in the Patient Logbook.  When you are ready to remove the patch, follow instructions on the last 2 pages of Patient  Logbook. Stick patch monitor onto the last page of Patient Logbook.  Place Patient Logbook in the blue and white box. Use  locking tab on box and tape box closed  securely. The blue and white box has prepaid postage on it. Please place it in the mailbox as  soon as possible. Your physician should have your test results approximately 7 days after the  monitor has been mailed back to [facility].  Call [place] at [phone] if you have questions regarding  your ZIO XT patch monitor. Call them immediately if you see an orange light blinking on your  monitor.  If your monitor falls off in less than 4 days,  contact our Monitor department at [phone].  If your monitor becomes loose or falls off after 4 days call [employer] at [phone] for  suggestions on securing your monitor    Follow-Up: At [facility], you and your health needs are our priority.  As part of our continuing mission to provide you with exceptional heart care, we have created designated Provider Care Teams.  These Care Teams include your primary Cardiologist (physician) and Advanced Practice Providers (APPs -  Physician Assistants and Nurse Practitioners) who all work together to provide you with the care you need, when you need it.  We recommend signing up for the patient portal called "MyChart".  Sign up information is provided on this After Visit Summary.  MyChart is used to connect with patients for Virtual Visits (Telemedicine).  Patients are able to view lab/test results, encounter notes, upcoming appointments, etc.  Non-urgent messages can be sent to your provider as well.   To learn more about what you can do with MyChart, go to [email or web address].    Your next appointment:   5 -6week(s)  Provider:   Mayra Reel, NP

## 2022-03-21 NOTE — Progress Notes (Unsigned)
Enrolled patient for a 14 day Zio XT monitor to be mailed to patients home  ? ?Dr Harding to read ?

## 2022-03-29 ENCOUNTER — Other Ambulatory Visit: Payer: Self-pay | Admitting: Family Medicine

## 2022-03-29 DIAGNOSIS — R7303 Prediabetes: Secondary | ICD-10-CM

## 2022-03-29 DIAGNOSIS — E119 Type 2 diabetes mellitus without complications: Secondary | ICD-10-CM

## 2022-04-02 DIAGNOSIS — R002 Palpitations: Secondary | ICD-10-CM

## 2022-04-05 ENCOUNTER — Other Ambulatory Visit: Payer: Self-pay

## 2022-04-05 DIAGNOSIS — E039 Hypothyroidism, unspecified: Secondary | ICD-10-CM

## 2022-04-05 MED ORDER — LEVOTHYROXINE SODIUM 75 MCG PO TABS: 75.0000 ug | ORAL_TABLET | Freq: Every day | ORAL | 2 refills | Status: DC

## 2022-04-10 ENCOUNTER — Other Ambulatory Visit: Payer: Self-pay | Admitting: Family Medicine

## 2022-04-23 NOTE — Progress Notes (Deleted)
Cardiology Clinic Note   Date: 04/23/2022 ID: TALAYLA WESTBY, DOB [birth year], MRN [id number]  Primary Cardiologist:  Glenetta Hew, MD  Patient Profile    [name] is a 62 y.o. female who presents to the clinic today for 6-week follow-up.  Past medical history significant for: Nonobstructive CAD.  Nuclear stress test 06/02/2015: Normal, low risk study.  Coronary CT 05/14/2021: Coronary calcium score 109 (90th percentile).  Nonobstructive CAD. Palpitations:  Outpatient telemetry monitor 12/23/2014: Mostly NSR with occasional sinus bradycardia or tachycardia, range 57-120 bpm, average 79 bpm. No afib, PVCs, PACs or other arrhythmia.  Hyperlipidemia. Lipid panel 01/10/2022: LDL 104, HDL 33, TG 230, total 177. GERD/PUD. CKD stage III. Tobacco abuse. Fibromyalgia. Chronic pain syndrome. OSA.    History of Present Illness    [name] is followed by Dr. Ellyn Hack for the above outlined history.  Patient was last seen in the office by me on 03/21/2022 for reports of hypertension the month prior as called into triage.  At that time patient reported BP 191/105 and was instructed to report to the ED.  Patient reported not going to the ED secondary to BP coming down.  During her visit with me she reported since that time she had not checked her BP secondary to knowing when her BP is elevated.  Patient reported left-sided chest/breast pain occurring with stress or after doing to much physically.  She reported taking metoprolol to tartrate for breakthrough palpitations most days of the week on top of scheduled metoprolol succinate twice a day.  Patient also described presyncopal episodes with associated shortness of breath and racing heart.  Metoprolol succinate dose was titrated to 75 mg in the morning and 25 mg in the evening with continued use of metoprolol tartrate 25 mg for breakthrough palpitations.  She was instructed to track BP until return visit.  14-day ZIO was ordered but I do not have  a report.  Today, patient ***    Hypertension. BP today *** Patient*** Palpitations. Event monitor 2016 was mostly sinus rhythm with occasional bradycardia and tachycardia.  Patient*** Presyncope.  *** Nonobstructive CAD. Coronary CT April 2023 showed coronary calcium score of 109 (90th percentile) and nonobstructive CAD. Patient denies chest pain, tightness, or pressure***.  Her activity is limited by back pain.  Continue aspirin, metoprolol and Crestor.   ROS: All other systems reviewed and are otherwise negative except as noted in History of Present Illness.  Studies Reviewed    ECG personally reviewed by me today: ***  No significant changes from ***  Risk Assessment/Calculations    {Does this patient have ATRIAL FIBRILLATION?:[phone]} No BP recorded.  {Refresh Note OR Click here to enter BP  :1}***        Physical Exam    VS:  LMP 05/17/2009 (LMP Unknown)  , BMI There is no height or weight on file to calculate BMI.  GEN: Well nourished, well developed, in no acute distress. Neck: No JVD or carotid bruits. Cardiac: *** RRR. No murmurs. No rubs or gallops.   Respiratory:  Respirations regular and unlabored. Clear to auscultation without rales, wheezing or rhonchi. GI: Soft, nontender, nondistended. Extremities: Radials/DP/PT 2+ and equal bilaterally. No clubbing or cyanosis. No edema ***  Skin: Warm and dry, no rash. Neuro: Strength intact.  Assessment & Plan   ***  Disposition: ***     {Are you ordering a CV Procedure (e.g. stress test, cath, DCCV, TEE, etc)?   Press F2        :  [id number]   Darrick Meigs. Jaila Schellhorn, DNP, NP-C

## 2022-04-25 ENCOUNTER — Ambulatory Visit: Payer: 59 | Attending: Student | Admitting: Student

## 2022-04-26 DIAGNOSIS — R002 Palpitations: Secondary | ICD-10-CM | POA: Diagnosis not present

## 2022-05-03 ENCOUNTER — Other Ambulatory Visit: Payer: Self-pay | Admitting: Family Medicine

## 2022-05-03 DIAGNOSIS — M1A0720 Idiopathic chronic gout, left ankle and foot, without tophus (tophi): Secondary | ICD-10-CM

## 2022-05-10 ENCOUNTER — Encounter: Payer: Self-pay | Admitting: Pharmacist

## 2022-05-10 NOTE — Progress Notes (Signed)
[facility] Customer service manager [facility] Quality Pharmacy Team Statin Quality Measure Assessment   05/10/2022  [name] August 18, 1960 [id number]  Per review of chart and payor information, Ms. Slagel has a diagnosis of diabetes but is not currently filling a statin prescription.  This places patient into the Statin Use In Patients with Diabetes (SUPD) measure for CMS.  She last filled rosuvastatin in October 2023.  Patient has documented trials of rosuvastatin with reported muscle aches, but no corresponding CPT codes that would exclude patient from SUPD measure.  Her chart also mentions Prediabetes.  If clinically appropriate, please discuss statin compliance at tomorrow's office visit or code for statin intolerance or Prediabetes to remove her from the measure for 2024.  Please consider ONE of the following recommendations:  Initiate moderate intensity statin Atorvastatin 10 mg once daily, #90, 3 refills   Rosuvastatin 5 mg once daily, #90, 3 refills    Initiate low intensity          statin with reduced frequency if prior          statin intolerance 1x weekly, #13, 3 refills   2x weekly, #26, 3 refills   3x weekly, #39, 3 refills    Code for past statin intolerance or  other exclusions (required annually)  Provider Requirements: Associate code during an office visit or telehealth encounter  Drug Induced Myopathy G72.0   Myopathy, unspecified G72.9   Myositis, unspecified M60.9   Rhabdomyolysis M62.82   Cirrhosis of liver K74.69   Prediabetes R73.03   PCOS E28.2   Thank you for allowing [facility] pharmacy to be a part of this patient's care. Dellie Burns, PharmD [facility]  [facility] Clinical Pharmacist Office: [phone]

## 2022-05-13 ENCOUNTER — Ambulatory Visit (INDEPENDENT_AMBULATORY_CARE_PROVIDER_SITE_OTHER): Payer: 59 | Admitting: Family Medicine

## 2022-05-13 ENCOUNTER — Encounter: Payer: Self-pay | Admitting: Family Medicine

## 2022-05-13 VITALS — BP 132/82 | HR 71 | Temp 98.4°F | Ht 63.5 in | Wt 195.6 lb

## 2022-05-13 DIAGNOSIS — M1A0720 Idiopathic chronic gout, left ankle and foot, without tophus (tophi): Secondary | ICD-10-CM

## 2022-05-13 DIAGNOSIS — E039 Hypothyroidism, unspecified: Secondary | ICD-10-CM

## 2022-05-13 DIAGNOSIS — N1831 Chronic kidney disease, stage 3a: Secondary | ICD-10-CM

## 2022-05-13 DIAGNOSIS — R7303 Prediabetes: Secondary | ICD-10-CM

## 2022-05-13 DIAGNOSIS — I10 Essential (primary) hypertension: Secondary | ICD-10-CM

## 2022-05-13 DIAGNOSIS — G959 Disease of spinal cord, unspecified: Secondary | ICD-10-CM | POA: Insufficient documentation

## 2022-05-13 LAB — CBC WITH DIFFERENTIAL/PLATELET
HCT: 44.1 % (ref 35.0–45.0)
Hemoglobin: 14 g/dL (ref 11.7–15.5)
Lymphs Abs: 2132 cells/uL (ref 850–3900)
MCH: 29.2 pg (ref 27.0–33.0)
MCV: 91.9 fL (ref 80.0–100.0)
MPV: 9.5 fL (ref 7.5–12.5)
RBC: 4.8 10*6/uL (ref 3.80–5.10)
RDW: 13.9 % (ref 11.0–15.0)
Total Lymphocyte: 29.2 %
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
## 2022-05-14 ENCOUNTER — Other Ambulatory Visit: Payer: Self-pay

## 2022-05-14 LAB — COMPLETE METABOLIC PANEL WITH GFR
AG Ratio: 1.6 (calc) (ref 1.0–2.5)
ALT: 7 U/L (ref 6–29)
AST: 11 U/L (ref 10–35)
Albumin: 3.9 g/dL (ref 3.6–5.1)
Alkaline phosphatase (APISO): 86 U/L (ref 37–153)
BUN/Creatinine Ratio: 9 (calc) (ref 6–22)
BUN: 13 mg/dL (ref 7–25)
CO2: 35 mmol/L — ABNORMAL HIGH (ref 20–32)
Calcium: 9.8 mg/dL (ref 8.6–10.4)
Chloride: 101 mmol/L (ref 98–110)
Creat: 1.38 mg/dL — ABNORMAL HIGH (ref 0.50–1.05)
Globulin: 2.5 g/dL (calc) (ref 1.9–3.7)
Glucose, Bld: 99 mg/dL (ref 65–99)
Potassium: 6.1 mmol/L — ABNORMAL HIGH (ref 3.5–5.3)
Sodium: 142 mmol/L (ref 135–146)
Total Bilirubin: 0.3 mg/dL (ref 0.2–1.2)
Total Protein: 6.4 g/dL (ref 6.1–8.1)
eGFR: 44 mL/min/{1.73_m2} — ABNORMAL LOW (ref >=60)

## 2022-05-14 LAB — LIPID PANEL
Cholesterol: 227 mg/dL — ABNORMAL HIGH (ref <200)
HDL: 43 mg/dL — ABNORMAL LOW (ref >=50)
LDL Cholesterol (Calc): 139 mg/dL (calc) — ABNORMAL HIGH
Non-HDL Cholesterol (Calc): 184 mg/dL (calc) — ABNORMAL HIGH (ref <130)
Total CHOL/HDL Ratio: 5.3 (calc) — ABNORMAL HIGH (ref <5.0)
Triglycerides: 313 mg/dL — ABNORMAL HIGH (ref <150)

## 2022-05-14 LAB — CBC WITH DIFFERENTIAL/PLATELET
Absolute Monocytes: 460 cells/uL (ref 200–950)
Basophils Absolute: 29 cells/uL (ref 0–200)
Basophils Relative: 0.4 %
Eosinophils Absolute: 219 cells/uL (ref 15–500)
Eosinophils Relative: 3 %
MCHC: 31.7 g/dL — ABNORMAL LOW (ref 32.0–36.0)
Monocytes Relative: 6.3 %
Neutro Abs: 4460 cells/uL (ref 1500–7800)
Neutrophils Relative %: 61.1 %
Platelets: 414 10*3/uL — ABNORMAL HIGH (ref 140–400)
WBC: 7.3 10*3/uL (ref 3.8–10.8)

## 2022-05-14 LAB — HEMOGLOBIN A1C
Hgb A1c MFr Bld: 6.3 % of total Hgb — ABNORMAL HIGH (ref <5.7)
Mean Plasma Glucose: 134 mg/dL
eAG (mmol/L): 7.4 mmol/L

## 2022-05-14 LAB — TSH: TSH: 1.78 mIU/L (ref 0.40–4.50)

## 2022-05-14 MED ORDER — OZEMPIC (0.25 OR 0.5 MG/DOSE) 2 MG/3ML ~~LOC~~ SOPN: 0.5000 mg | PEN_INJECTOR | SUBCUTANEOUS | 3 refills | Status: DC

## 2022-05-22 ENCOUNTER — Ambulatory Visit (INDEPENDENT_AMBULATORY_CARE_PROVIDER_SITE_OTHER): Payer: 59

## 2022-05-22 VITALS — Ht 63.5 in | Wt 195.0 lb

## 2022-05-22 DIAGNOSIS — R7303 Prediabetes: Secondary | ICD-10-CM | POA: Diagnosis not present

## 2022-05-22 DIAGNOSIS — Z7689 Persons encountering health services in other specified circumstances: Secondary | ICD-10-CM

## 2022-05-22 NOTE — Progress Notes (Signed)
Pt here for [employer] education. Patient given instructions/demonstration on how to dose and administer Ozempic. Patient was able to demonstrate proper injection of medication. Advised patient if she has any issues to let us know. Pt verbalized understanding of all. Mjp,lpn

## 2022-06-13 ENCOUNTER — Ambulatory Visit (INDEPENDENT_AMBULATORY_CARE_PROVIDER_SITE_OTHER): Payer: 59

## 2022-06-13 VITALS — Ht 63.5 in | Wt 195.0 lb

## 2022-06-13 DIAGNOSIS — Z0000 Encounter for general adult medical examination without abnormal findings: Secondary | ICD-10-CM | POA: Diagnosis not present

## 2022-06-13 DIAGNOSIS — Z1231 Encounter for screening mammogram for malignant neoplasm of breast: Secondary | ICD-10-CM
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
## 2022-06-13 NOTE — Patient Instructions (Signed)
[name] , Thank you for taking time to come for your Medicare Wellness Visit. I appreciate your ongoing commitment to your health goals. Please review the following plan we discussed and let me know if I can assist you in the future.   These are the goals we discussed:  Goals      Quit Smoking     1-800-QUIT-NOW line provided      Remain active and independent        This is a list of the screening recommended for you and due dates:  Health Maintenance  Topic Date Due   COVID-19 Vaccine (4 - 2023-24 season) 09/21/2021   Screening for Lung Cancer  05/15/2022   Zoster (Shingles) Vaccine (1 of 2) 08/12/2022*   Pap Smear  09/13/2022*   Stool Blood Test  11/12/2022*   Mammogram  05/13/2023*   HIV Screening  05/13/2023*   Flu Shot  08/22/2022   Medicare Annual Wellness Visit  06/13/2023   Hepatitis C Screening  Completed   HPV Vaccine  Aged Out   DTaP/Tdap/Td vaccine  Discontinued   Colon Cancer Screening  Discontinued  *Topic was postponed. The date shown is not the original due date.    Advanced directives: Information on Advanced Care Planning can be found at [facility] Advance Health Care Directives Advance Health Care Directives ([email or web address]    Conditions/risks identified: Aim for 30 minutes of exercise or brisk walking, 6-8 glasses of water, and 5 servings of fruits and vegetables each day.   Next appointment: Follow up in one year for your annual wellness visit.   The number to schedule your mammogram at [facility] is [phone]   Preventive Care 40-64 Years, Female Preventive care refers to lifestyle choices and visits with your health care provider that can promote health and wellness. What does preventive care include? A yearly physical exam. This is also called an annual well check. Dental exams once or twice a year. Routine eye exams. Ask your health care provider how often you should have your eyes checked. Personal lifestyle  choices, including: Daily care of your teeth and gums. Regular physical activity. Eating a healthy diet. Avoiding tobacco and drug use. Limiting alcohol use. Practicing safe sex. Taking low-dose aspirin daily starting at age 66. Taking vitamin and mineral supplements as recommended by your health care provider. What happens during an annual well check? The services and screenings done by your health care provider during your annual well check will depend on your age, overall health, lifestyle risk factors, and family history of disease. Counseling  Your health care provider may ask you questions about your: Alcohol use. Tobacco use. Drug use. Emotional well-being. Home and relationship well-being. Sexual activity. Eating habits. Work and work Astronomer. Method of birth control. Menstrual cycle. Pregnancy history. Screening  You may have the following tests or measurements: Height, weight, and BMI. Blood pressure. Lipid and cholesterol levels. These may be checked every 5 years, or more frequently if you are over 39 years old. Skin check. Lung cancer screening. You may have this screening every year starting at age 24 if you have a 30-pack-year history of smoking and currently smoke or have quit within the past 15 years. Fecal occult blood test (FOBT) of the stool. You may have this test every year starting at age 36. Flexible sigmoidoscopy or colonoscopy. You may have a sigmoidoscopy every 5 years or a colonoscopy every 10 years starting at age 65. Hepatitis C blood test.  Hepatitis B blood test. Sexually transmitted disease (STD) testing. Diabetes screening. This is done by checking your blood sugar (glucose) after you have not eaten for a while (fasting). You may have this done every 1-3 years. Mammogram. This may be done every 1-2 years. Talk to your health care provider about when you should start having regular mammograms. This may depend on whether you have a family  history of breast cancer. BRCA-related cancer screening. This may be done if you have a family history of breast, ovarian, tubal, or peritoneal cancers. Pelvic exam and Pap test. This may be done every 3 years starting at age 78. Starting at age 48, this may be done every 5 years if you have a Pap test in combination with an HPV test. Bone density scan. This is done to screen for osteoporosis. You may have this scan if you are at high risk for osteoporosis. Discuss your test results, treatment options, and if necessary, the need for more tests with your health care provider. Vaccines  Your health care provider may recommend certain vaccines, such as: Influenza vaccine. This is recommended every year. Tetanus, diphtheria, and acellular pertussis (Tdap, Td) vaccine. You may need a Td booster every 10 years. Zoster vaccine. You may need this after age 35. Pneumococcal 13-valent conjugate (PCV13) vaccine. You may need this if you have certain conditions and were not previously vaccinated. Pneumococcal polysaccharide (PPSV23) vaccine. You may need one or two doses if you smoke cigarettes or if you have certain conditions. Talk to your health care provider about which screenings and vaccines you need and how often you need them. This information is not intended to replace advice given to you by your health care provider. Make sure you discuss any questions you have with your health care provider. Document Released: 02/03/2015 Document Revised: 09/27/2015 Document Reviewed: 11/08/2014 Elsevier Interactive Patient Education  2017 [employer].    Fall Prevention in the Home Falls can cause injuries. They can happen to people of all ages. There are many things you can do to make your home safe and to help prevent falls. What can I do on the outside of my home? Regularly fix the edges of walkways and driveways and fix any cracks. Remove anything that might make you trip as you walk through a door, such  as a raised step or threshold. Trim any bushes or trees on the path to your home. Use bright outdoor lighting. Clear any walking paths of anything that might make someone trip, such as rocks or tools. Regularly check to see if handrails are loose or broken. Make sure that both sides of any steps have handrails. Any raised decks and porches should have guardrails on the edges. Have any leaves, snow, or ice cleared regularly. Use sand or salt on walking paths during winter. Clean up any spills in your garage right away. This includes oil or grease spills. What can I do in the bathroom? Use night lights. Install grab bars by the toilet and in the tub and shower. Do not use towel bars as grab bars. Use non-skid mats or decals in the tub or shower. If you need to sit down in the shower, use a plastic, non-slip stool. Keep the floor dry. Clean up any water that spills on the floor as soon as it happens. Remove soap buildup in the tub or shower regularly. Attach bath mats securely with double-sided non-slip rug tape. Do not have throw rugs and other things on the floor that  can make you trip. What can I do in the bedroom? Use night lights. Make sure that you have a light by your bed that is easy to reach. Do not use any sheets or blankets that are too big for your bed. They should not hang down onto the floor. Have a firm chair that has side arms. You can use this for support while you get dressed. Do not have throw rugs and other things on the floor that can make you trip. What can I do in the kitchen? Clean up any spills right away. Avoid walking on wet floors. Keep items that you use a lot in easy-to-reach places. If you need to reach something above you, use a strong step stool that has a grab bar. Keep electrical cords out of the way. Do not use floor polish or wax that makes floors slippery. If you must use wax, use non-skid floor wax. Do not have throw rugs and other things on the  floor that can make you trip. What can I do with my stairs? Do not leave any items on the stairs. Make sure that there are handrails on both sides of the stairs and use them. Fix handrails that are broken or loose. Make sure that handrails are as long as the stairways. Check any carpeting to make sure that it is firmly attached to the stairs. Fix any carpet that is loose or worn. Avoid having throw rugs at the top or bottom of the stairs. If you do have throw rugs, attach them to the floor with carpet tape. Make sure that you have a light switch at the top of the stairs and the bottom of the stairs. If you do not have them, ask someone to add them for you. What else can I do to help prevent falls? Wear shoes that: Do not have high heels. Have rubber bottoms. Are comfortable and fit you well. Are closed at the toe. Do not wear sandals. If you use a stepladder: Make sure that it is fully opened. Do not climb a closed stepladder. Make sure that both sides of the stepladder are locked into place. Ask someone to hold it for you, if possible. Clearly mark and make sure that you can see: Any grab bars or handrails. First and last steps. Where the edge of each step is. Use tools that help you move around (mobility aids) if they are needed. These include: Canes. Walkers. Scooters. Crutches. Turn on the lights when you go into a dark area. Replace any light bulbs as soon as they burn out. Set up your furniture so you have a clear path. Avoid moving your furniture around. If any of your floors are uneven, fix them. If there are any pets around you, be aware of where they are. Review your medicines with your doctor. Some medicines can make you feel dizzy. This can increase your chance of falling. Ask your doctor what other things that you can do to help prevent falls. This information is not intended to replace advice given to you by your health care provider. Make sure you discuss any questions  you have with your health care provider. Document Released: 11/03/2008 Document Revised: 06/15/2015 Document Reviewed: 02/11/2014 Elsevier Interactive Patient Education  2017 [employer].

## 2022-06-17 ENCOUNTER — Other Ambulatory Visit: Payer: Self-pay | Admitting: Student

## 2022-07-11 DIAGNOSIS — M5412 Radiculopathy, cervical region: Secondary | ICD-10-CM | POA: Diagnosis not present

## 2022-07-11 DIAGNOSIS — M4316 Spondylolisthesis, lumbar region: Secondary | ICD-10-CM | POA: Diagnosis not present

## 2022-07-11 DIAGNOSIS — M542 Cervicalgia: Secondary | ICD-10-CM | POA: Diagnosis not present

## 2022-07-24 ENCOUNTER — Other Ambulatory Visit: Payer: Self-pay | Admitting: Cardiology

## 2022-07-30 ENCOUNTER — Ambulatory Visit (INDEPENDENT_AMBULATORY_CARE_PROVIDER_SITE_OTHER): Payer: 59 | Admitting: Family Medicine

## 2022-07-30 VITALS — BP 132/70 | HR 86 | Temp 98.2°F | Ht 63.5 in | Wt 181.3 lb

## 2022-07-30 DIAGNOSIS — E782 Mixed hyperlipidemia: Secondary | ICD-10-CM

## 2022-07-30 DIAGNOSIS — L304 Erythema intertrigo: Secondary | ICD-10-CM

## 2022-07-30 DIAGNOSIS — F410 Panic disorder [episodic paroxysmal anxiety] without agoraphobia: Secondary | ICD-10-CM | POA: Diagnosis not present

## 2022-07-30 MED ORDER — MOVANTIK 25 MG PO TABS: 25.0000 mg | ORAL_TABLET | Freq: Every day | ORAL | 2 refills | Status: DC

## 2022-07-30 MED ORDER — NYSTATIN 100000 UNIT/GM EX CREA: TOPICAL_CREAM | Freq: Three times a day (TID) | CUTANEOUS | 2 refills | Status: DC

## 2022-07-30 MED ORDER — HYDROXYZINE PAMOATE 25 MG PO CAPS
25.0000 mg | ORAL_CAPSULE | Freq: Three times a day (TID) | ORAL | 0 refills | Status: DC | PRN
Start: 2022-07-30 — End: 2022-09-13

## 2022-07-30 MED ORDER — ROSUVASTATIN CALCIUM 20 MG PO TABS: 20.0000 mg | ORAL_TABLET | Freq: Every day | ORAL | 3 refills | Status: DC
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
## 2022-08-01 ENCOUNTER — Other Ambulatory Visit: Payer: Self-pay | Admitting: Family Medicine

## 2022-08-01 DIAGNOSIS — M1A0720 Idiopathic chronic gout, left ankle and foot, without tophus (tophi): Secondary | ICD-10-CM

## 2022-09-02 ENCOUNTER — Other Ambulatory Visit: Payer: Self-pay | Admitting: Family Medicine

## 2022-09-03 NOTE — Telephone Encounter (Signed)
Unable to refill per protocol, Rx expired. Discontinued 07/30/22.  Requested Prescriptions  Pending Prescriptions Disp Refills   OZEMPIC, 0.25 OR 0.5 MG/DOSE, 2 MG/3ML SOPN [Pharmacy Med Name: OZEMPIC 0.25-0.5 MG/DOSE PEN]  3    Sig: INJECT 0.5 MG INTO THE SKIN ONE TIME PER WEEK     Endocrinology:  Diabetes - GLP-1 Receptor Agonists - semaglutide Failed - 09/02/2022  2:43 AM      Failed - HBA1C in normal range and within 180 days    Hgb A1c MFr Bld  Date Value Ref Range Status  05/13/2022 6.3 (H) <5.7 % of total Hgb Final    Comment:    For someone without known diabetes, a hemoglobin  A1c value between 5.7% and 6.4% is consistent with prediabetes and should be confirmed with a  follow-up test. . For someone with known diabetes, a value <7% indicates that their diabetes is well controlled. A1c targets should be individualized based on duration of diabetes, age, comorbid conditions, and other considerations. . This assay result is consistent with an increased risk of diabetes. . Currently, no consensus exists regarding use of hemoglobin A1c for diagnosis of diabetes for children. .          Failed - Cr in normal range and within 360 days    Creat  Date Value Ref Range Status  05/13/2022 1.38 (H) 0.50 - 1.05 mg/dL Final         Failed - Valid encounter within last 6 months    Recent Outpatient Visits   None     Future Appointments             In 1 month Pickard, Priscille Heidelberg, MD [facility]

## 2022-09-04 ENCOUNTER — Ambulatory Visit: Admission: RE | Admit: 2022-09-04 | Payer: 59 | Source: Ambulatory Visit

## 2022-09-04 ENCOUNTER — Ambulatory Visit: Payer: 59

## 2022-09-04 DIAGNOSIS — Z1231 Encounter for screening mammogram for malignant neoplasm of breast: Secondary | ICD-10-CM | POA: Diagnosis not present

## 2022-09-13 ENCOUNTER — Telehealth: Payer: Self-pay

## 2022-09-13 ENCOUNTER — Other Ambulatory Visit: Payer: Self-pay | Admitting: Family Medicine

## 2022-09-13 ENCOUNTER — Other Ambulatory Visit: Payer: Self-pay | Admitting: Student

## 2022-09-13 MED ORDER — HYDROXYZINE PAMOATE 25 MG PO CAPS: 25.0000 mg | ORAL_CAPSULE | Freq: Three times a day (TID) | ORAL | 0 refills | Status: DC | PRN

## 2022-09-13 NOTE — Telephone Encounter (Signed)
Pt came in to request a refill of this med hydrOXYzine (VISTARIL) 25 MG capsule [[id number]]. Pt states that this med has been helping with both her anxiety and sleeping. Pt states that she is completely out of this med. Please advise.  LOV: 7//9/24  PHARMACY: [id number] [place], [place] - [address]  [address] [place] [address]   CB#: [phone]

## 2022-09-18 ENCOUNTER — Other Ambulatory Visit: Payer: Self-pay | Admitting: Family Medicine

## 2022-09-18 DIAGNOSIS — E119 Type 2 diabetes mellitus without complications: Secondary | ICD-10-CM

## 2022-09-18 DIAGNOSIS — R7303 Prediabetes: Secondary | ICD-10-CM

## 2022-10-08 ENCOUNTER — Other Ambulatory Visit: Payer: Self-pay | Admitting: Family Medicine

## 2022-10-21 ENCOUNTER — Other Ambulatory Visit: Payer: Self-pay | Admitting: Family Medicine

## 2022-10-22 NOTE — Telephone Encounter (Signed)
Last OV 07/30/22 Requested Prescriptions  Pending Prescriptions Disp Refills   hydrOXYzine (VISTARIL) 25 MG capsule [Pharmacy Med Name: HYDROXYZINE PAM 25 MG CAP] 30 capsule 0    Sig: TAKE 1 CAPSULE (25 MG TOTAL) BY MOUTH EVERY 8 (EIGHT) HOURS AS NEEDED.     Ear, Nose, and Throat:  Antihistamines 2 Failed - 10/21/2022 12:31 PM      Failed - Cr in normal range and within 360 days    Creat  Date Value Ref Range Status  05/13/2022 1.38 (H) 0.50 - 1.05 mg/dL Final         Failed - Valid encounter within last 12 months    Recent Outpatient Visits   None     Future Appointments             In 1 week Pickard, Priscille Heidelberg, MD [facility], [facility]

## 2022-10-24 ENCOUNTER — Ambulatory Visit: Payer: 59

## 2022-10-24 ENCOUNTER — Ambulatory Visit: Payer: 59 | Admitting: Family Medicine

## 2022-10-24 VITALS — BP 136/84 | HR 52

## 2022-10-24 DIAGNOSIS — R519 Headache, unspecified: Secondary | ICD-10-CM

## 2022-10-24 DIAGNOSIS — I10 Essential (primary) hypertension: Secondary | ICD-10-CM

## 2022-10-24 NOTE — Progress Notes (Signed)
Pt c/o elevated BP's at home. BP wnl at office today. Made appt for pt to be seen for Friday at 11 am.

## 2022-10-25 ENCOUNTER — Ambulatory Visit: Payer: 59 | Admitting: Family Medicine

## 2022-10-27 ENCOUNTER — Other Ambulatory Visit: Payer: Self-pay | Admitting: Family Medicine

## 2022-10-27 DIAGNOSIS — M1A0720 Idiopathic chronic gout, left ankle and foot, without tophus (tophi): Secondary | ICD-10-CM

## 2022-10-28 NOTE — Telephone Encounter (Signed)
Requested medication (s) are due for refill today: yes  Requested medication (s) are on the active medication list: yes  Last refill:  08/01/22  Future visit scheduled: yes  Notes to clinic:  Unable to refill per protocol, routing for approval.      Requested Prescriptions  Pending Prescriptions Disp Refills   allopurinol (ZYLOPRIM) 100 MG tablet [Pharmacy Med Name: ALLOPURINOL 100 MG TABLET] 90 tablet 0    Sig: TAKE 1 TABLET BY MOUTH EVERY DAY     There is no refill protocol information for this order

## 2022-10-29 DIAGNOSIS — M542 Cervicalgia: Secondary | ICD-10-CM | POA: Diagnosis not present

## 2022-10-29 DIAGNOSIS — M4316 Spondylolisthesis, lumbar region: Secondary | ICD-10-CM | POA: Diagnosis not present

## 2022-10-29 DIAGNOSIS — M5412 Radiculopathy, cervical region: Secondary | ICD-10-CM | POA: Diagnosis not present

## 2022-10-31 ENCOUNTER — Ambulatory Visit (INDEPENDENT_AMBULATORY_CARE_PROVIDER_SITE_OTHER): Payer: 59 | Admitting: Family Medicine

## 2022-10-31 ENCOUNTER — Encounter: Payer: Self-pay | Admitting: Family Medicine

## 2022-10-31 VITALS — HR 62 | Temp 97.8°F | Ht 63.5 in | Wt 179.0 lb

## 2022-10-31 DIAGNOSIS — R7303 Prediabetes: Secondary | ICD-10-CM | POA: Diagnosis not present

## 2022-10-31 DIAGNOSIS — E039 Hypothyroidism, unspecified: Secondary | ICD-10-CM

## 2022-10-31 DIAGNOSIS — E782 Mixed hyperlipidemia: Secondary | ICD-10-CM | POA: Diagnosis not present

## 2022-10-31 MED ORDER — ONDANSETRON HCL 4 MG PO TABS: 4.0000 mg | ORAL_TABLET | Freq: Three times a day (TID) | ORAL | 0 refills | Status: DC | PRN
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
## 2022-11-01 LAB — COMPLETE METABOLIC PANEL WITH GFR
AG Ratio: 1.7 (calc) (ref 1.0–2.5)
ALT: 6 U/L (ref 6–29)
AST: 10 U/L (ref 10–35)
Albumin: 3.9 g/dL (ref 3.6–5.1)
Alkaline phosphatase (APISO): 81 U/L (ref 37–153)
BUN/Creatinine Ratio: 10 (calc) (ref 6–22)
BUN: 13 mg/dL (ref 7–25)
CO2: 30 mmol/L (ref 20–32)
Calcium: 9.2 mg/dL (ref 8.6–10.4)
Chloride: 102 mmol/L (ref 98–110)
Creat: 1.32 mg/dL — ABNORMAL HIGH (ref 0.50–1.05)
Globulin: 2.3 g/dL (ref 1.9–3.7)
Glucose, Bld: 124 mg/dL — ABNORMAL HIGH (ref 65–99)
Potassium: 4.2 mmol/L (ref 3.5–5.3)
Sodium: 139 mmol/L (ref 135–146)
Total Bilirubin: 0.5 mg/dL (ref 0.2–1.2)
Total Protein: 6.2 g/dL (ref 6.1–8.1)
eGFR: 46 mL/min/{1.73_m2} — ABNORMAL LOW (ref >=60)

## 2022-11-01 LAB — CBC WITH DIFFERENTIAL/PLATELET
Absolute Monocytes: 363 {cells}/uL (ref 200–950)
Basophils Absolute: 37 {cells}/uL (ref 0–200)
Basophils Relative: 0.5 %
Eosinophils Absolute: 222 {cells}/uL (ref 15–500)
Eosinophils Relative: 3 %
HCT: 45.8 % — ABNORMAL HIGH (ref 35.0–45.0)
Hemoglobin: 14.8 g/dL (ref 11.7–15.5)
Lymphs Abs: 2398 {cells}/uL (ref 850–3900)
MCH: 30.6 pg (ref 27.0–33.0)
MCHC: 32.3 g/dL (ref 32.0–36.0)
MCV: 94.6 fL (ref 80.0–100.0)
MPV: 10.2 fL (ref 7.5–12.5)
Monocytes Relative: 4.9 %
Neutro Abs: 4381 {cells}/uL (ref 1500–7800)
Neutrophils Relative %: 59.2 %
Platelets: 294 10*3/uL (ref 140–400)
RBC: 4.84 10*6/uL (ref 3.80–5.10)
RDW: 13.6 % (ref 11.0–15.0)
Total Lymphocyte: 32.4 %
WBC: 7.4 10*3/uL (ref 3.8–10.8)

## 2022-11-01 LAB — LIPID PANEL
Cholesterol: 136 mg/dL (ref <200)
HDL: 39 mg/dL — ABNORMAL LOW (ref >=50)
LDL Cholesterol (Calc): 72 mg/dL
Non-HDL Cholesterol (Calc): 97 mg/dL (ref <130)
Total CHOL/HDL Ratio: 3.5 (calc) (ref <5.0)
Triglycerides: 174 mg/dL — ABNORMAL HIGH (ref <150)

## 2022-11-01 LAB — HEMOGLOBIN A1C
Hgb A1c MFr Bld: 6 %{Hb} — ABNORMAL HIGH (ref <5.7)
Mean Plasma Glucose: 126 mg/dL
eAG (mmol/L): 7 mmol/L

## 2022-11-01 LAB — TSH: TSH: 0.57 m[IU]/L (ref 0.40–4.50)

## 2022-11-13 ENCOUNTER — Telehealth: Payer: Self-pay

## 2022-11-13 NOTE — Telephone Encounter (Signed)
Pt called in to check on status of this refill allopurinol (ZYLOPRIM) 100 MG tablet [[id number]]. Pt stated this refill was requested 2 weeks ago and now pt is completely out of this med.   LOV: 10/31/22  PHARMACY: [id number] [place], [place] - [address] [place] [address] Phone: [phone]  Fax: [phone] DEA #: [id number]    CB#: [phone]

## 2022-11-14 ENCOUNTER — Other Ambulatory Visit: Payer: Self-pay

## 2022-11-14 DIAGNOSIS — M1A0720 Idiopathic chronic gout, left ankle and foot, without tophus (tophi): Secondary | ICD-10-CM

## 2022-11-14 MED ORDER — ALLOPURINOL 100 MG PO TABS: 100.0000 mg | ORAL_TABLET | Freq: Every day | ORAL | 1 refills | Status: DC

## 2022-11-26 ENCOUNTER — Telehealth: Payer: Self-pay

## 2022-11-26 ENCOUNTER — Other Ambulatory Visit: Payer: Self-pay | Admitting: Family Medicine

## 2022-11-26 MED ORDER — ONDANSETRON HCL 4 MG PO TABS: 4.0000 mg | ORAL_TABLET | Freq: Three times a day (TID) | ORAL | 2 refills | Status: DC | PRN

## 2022-11-26 MED ORDER — HYDROXYZINE PAMOATE 25 MG PO CAPS: 25.0000 mg | ORAL_CAPSULE | Freq: Three times a day (TID) | ORAL | 2 refills | Status: DC | PRN

## 2022-11-26 NOTE — Telephone Encounter (Signed)
Pt called in to request refills of these meds.  hydrOXYzine (VISTARIL) 25 MG capsule [[id number]] ondansetron (ZOFRAN) 4 MG tablet [[id number]]  Pt asks if the refill for this med ondansetron could be sent in for 30 instead of 20 please.  LOV: 07/30/22  PHARMACY: [id number] [place], [place] - [address] [place] [address] Phone: [phone]  Fax: [phone]  CB#: [phone]

## 2022-12-06 LAB — HM DIABETES EYE EXAM

## 2022-12-25 ENCOUNTER — Telehealth: Payer: Self-pay | Admitting: Family Medicine

## 2022-12-25 ENCOUNTER — Other Ambulatory Visit: Payer: Self-pay | Admitting: Family Medicine

## 2022-12-25 DIAGNOSIS — E039 Hypothyroidism, unspecified: Secondary | ICD-10-CM

## 2022-12-25 NOTE — Telephone Encounter (Signed)
Prescription Request  12/25/2022  LOV: 10/31/2022  What is the name of the medication or equipment? levothyroxine (SYNTHROID) 75 MCG tablet   Have you contacted your pharmacy to request a refill? Yes   Which pharmacy would you like this sent to?  [id number] [place], [place] - [address] [place] [address] Phone: [phone] Fax: [phone]    Patient notified that their request is being sent to the clinical staff for review and that they should receive a response within 2 business days.   Please advise at [facility] [phone]

## 2022-12-25 NOTE — Telephone Encounter (Signed)
Already requested today in a separate encounter, this is a duplicate request.

## 2022-12-27 NOTE — Telephone Encounter (Signed)
Requested medication (s) are due for refill today: Due 01/05/23  Requested medication (s) are on the active medication list: yes    Last refill: 04/05/22  #90  2 refills  Future visit scheduled no  Notes to clinic:Historical Provider, please review. Thank you.  Requested Prescriptions  Pending Prescriptions Disp Refills   levothyroxine (SYNTHROID) 75 MCG tablet 90 tablet 2    Sig: Take 1 tablet (75 mcg total) by mouth daily before breakfast. Need appt for more refills.     Endocrinology:  Hypothyroid Agents Failed - 12/25/2022 11:38 AM      Failed - Valid encounter within last 12 months    Recent Outpatient Visits   None            Passed - TSH in normal range and within 360 days    TSH  Date Value Ref Range Status  10/31/2022 0.57 0.40 - 4.50 mIU/L Final

## 2023-01-20 ENCOUNTER — Other Ambulatory Visit: Payer: Self-pay | Admitting: Family Medicine

## 2023-01-20 DIAGNOSIS — E039 Hypothyroidism, unspecified: Secondary | ICD-10-CM

## 2023-01-21 ENCOUNTER — Telehealth: Payer: Self-pay

## 2023-01-21 MED ORDER — LEVOTHYROXINE SODIUM 75 MCG PO TABS: 75.0000 ug | ORAL_TABLET | Freq: Every day | ORAL | 2 refills | Status: DC

## 2023-01-21 NOTE — Telephone Encounter (Signed)
 Copied from CRM [id number]. Topic: Clinical - Medication Refill >> Jan 20, 2023  4:45 PM [name] wrote: Most Recent Primary Care Visit:  Provider: DUANNE LOWERS T  Department: [facility]  Visit Type: OFFICE VISIT  Date: 10/31/2022  Medication: levothyroxine  (SYNTHROID ) 75 MCG tablet  Has the patient contacted their pharmacy? Yes, states they advised her to reach out to her provider.  Is this the correct pharmacy for this prescription? Yes  This is the patient's preferred pharmacy:  [id number] [place], [place] - [address] [place] [address] Phone: [phone] Fax: [phone]   Has the prescription been filled recently? Yes   Is the patient out of the medication? Yes  Has the patient been seen for an appointment in the last year OR does the patient have an upcoming appointment? Yes  Can we respond through MyChart? No  Agent: Please be advised that Rx refills may take up to 3 business days. We ask that you follow-up with your pharmacy.

## 2023-01-22 ENCOUNTER — Other Ambulatory Visit: Payer: Self-pay | Admitting: Cardiology

## 2023-01-29 DIAGNOSIS — M4316 Spondylolisthesis, lumbar region: Secondary | ICD-10-CM | POA: Diagnosis not present

## 2023-01-29 DIAGNOSIS — M5412 Radiculopathy, cervical region: Secondary | ICD-10-CM | POA: Diagnosis not present

## 2023-01-29 DIAGNOSIS — M542 Cervicalgia: Secondary | ICD-10-CM | POA: Diagnosis not present

## 2023-02-07 ENCOUNTER — Other Ambulatory Visit: Payer: Self-pay | Admitting: Student

## 2023-02-07 ENCOUNTER — Other Ambulatory Visit: Payer: Self-pay | Admitting: Cardiology

## 2023-04-03 ENCOUNTER — Other Ambulatory Visit: Payer: Self-pay | Admitting: Family Medicine

## 2023-04-03 DIAGNOSIS — M1A0720 Idiopathic chronic gout, left ankle and foot, without tophus (tophi): Secondary | ICD-10-CM

## 2023-04-03 NOTE — Telephone Encounter (Signed)
 Last OV 10/31/22, with protocol.  Requested Prescriptions  Pending Prescriptions Disp Refills   allopurinol (ZYLOPRIM) 100 MG tablet [Pharmacy Med Name: ALLOPURINOL 100 MG TABLET] 90 tablet 0    Sig: TAKE 1 TABLET BY MOUTH EVERY DAY     Endocrinology:  Gout Agents - allopurinol Failed - 04/03/2023 12:27 PM      Failed - Uric Acid in normal range and within 360 days    Uric Acid  Date Value Ref Range Status  01/10/2022 5.7 3.0 - 7.2 mg/dL Final    Comment:               Therapeutic target for gout patients: <6.0         Failed - Cr in normal range and within 360 days    Creat  Date Value Ref Range Status  10/31/2022 1.32 (H) 0.50 - 1.05 mg/dL Final         Failed - Valid encounter within last 12 months    Recent Outpatient Visits   None     Future Appointments             In 3 weeks Cleaver, Thomasene Ripple, NP [facility] - CBC within normal limits and completed in the last 12 months    WBC  Date Value Ref Range Status  10/31/2022 7.4 3.8 - 10.8 Thousand/uL Final   RBC  Date Value Ref Range Status  10/31/2022 4.84 3.80 - 5.10 Million/uL Final   Hemoglobin  Date Value Ref Range Status  10/31/2022 14.8 11.7 - 15.5 g/dL Final  16/10/9602 54.0 11.1 - 15.9 g/dL Final   HCT  Date Value Ref Range Status  10/31/2022 45.8 (H) 35.0 - 45.0 % Final   Hematocrit  Date Value Ref Range Status  01/10/2022 41.6 34.0 - 46.6 % Final   MCHC  Date Value Ref Range Status  10/31/2022 32.3 32.0 - 36.0 g/dL Final    Comment:    For adults, a slight decrease in the calculated MCHC value (in the range of 30 to 32 g/dL) is most likely not clinically significant; however, it should be interpreted with caution in correlation with other red cell parameters and the patient's clinical condition.    [facility]  Date Value Ref Range Status  10/31/2022 30.6 27.0 - 33.0 pg Final   MCV  Date Value Ref Range Status  10/31/2022 94.6 80.0 - 100.0 fL  Final  01/10/2022 95 79 - 97 fL Final   No results found for: "PLTCOUNTKUC", "LABPLAT", "POCPLA" RDW  Date Value Ref Range Status  10/31/2022 13.6 11.0 - 15.0 % Final  01/10/2022 14.4 11.7 - 15.4 % Final

## 2023-04-08 ENCOUNTER — Other Ambulatory Visit: Payer: Self-pay | Admitting: Family Medicine

## 2023-04-09 NOTE — Telephone Encounter (Signed)
 Requested Prescriptions  Pending Prescriptions Disp Refills   pantoprazole (PROTONIX) 40 MG tablet [Pharmacy Med Name: PANTOPRAZOLE SOD DR 40 MG TAB] 90 tablet 1    Sig: TAKE 1 TABLET BY MOUTH EVERY DAY     Gastroenterology: Proton Pump Inhibitors Failed - 04/09/2023  5:44 PM      Failed - Valid encounter within last 12 months    Recent Outpatient Visits   None     Future Appointments             In 2 weeks Cleaver, Thomasene Ripple, NP [facility]
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
## 2023-04-24 ENCOUNTER — Encounter: Payer: Self-pay | Admitting: General Practice

## 2023-04-24 ENCOUNTER — Ambulatory Visit: Attending: General Practice | Admitting: General Practice

## 2023-04-24 VITALS — BP 132/84 | HR 59 | Ht 64.0 in | Wt 194.6 lb

## 2023-04-24 DIAGNOSIS — R002 Palpitations: Secondary | ICD-10-CM | POA: Diagnosis not present

## 2023-04-24 DIAGNOSIS — R55 Syncope and collapse: Secondary | ICD-10-CM

## 2023-04-24 DIAGNOSIS — I10 Essential (primary) hypertension: Secondary | ICD-10-CM

## 2023-04-24 DIAGNOSIS — I2510 Atherosclerotic heart disease of native coronary artery without angina pectoris: Secondary | ICD-10-CM

## 2023-04-24 MED ORDER — METOPROLOL TARTRATE 25 MG PO TABS: 25.0000 mg | ORAL_TABLET | Freq: Every day | ORAL | 3 refills | Status: DC | PRN

## 2023-04-24 NOTE — Patient Instructions (Signed)
 Medication Instructions:  The current medical regimen is effective;  continue present plan and medications as directed. Please refer to the Current Medication list given to you today.  *If you need a refill on your cardiac medications before your next appointment, please call your pharmacy*  Lab Work: NONE If you have labs (blood work) drawn today and your tests are completely normal, you will receive your results only by:  MyChart Message (if you have MyChart) OR A paper copy in the mail If you have any lab test that is abnormal or we need to change your treatment, we will call you to review the results.  Testing/Procedures: NONE  Follow-Up: At [facility], you and your health needs are our priority.  As part of our continuing mission to provide you with exceptional heart care, our providers are all part of one team.  This team includes your primary Cardiologist (physician) and Advanced Practice Providers or APPs (Physician Assistants and Nurse Practitioners) who all work together to provide you with the care you need, when you need it.  Your next appointment:   12 month(s)  Provider:   Bryan Lemma, MD    Other Instructions INCREASE PHYSICAL ACTIVITY AS TOLERATED PLEASE READ AND FOLLOW ATTACHED  SALTY 6          1st Floor: - Lobby - Registration  - Pharmacy  - Lab - Cafe  2nd Floor: - PV Lab - Diagnostic Testing (echo, CT, nuclear med)  3rd Floor: - Vacant  4th Floor: - TCTS (cardiothoracic surgery) - AFib Clinic - Structural Heart Clinic - Vascular Surgery  - Vascular Ultrasound  5th Floor: - HeartCare Cardiology (general and EP) - Clinical Pharmacy for coumadin, hypertension, lipid, weight-loss medications, and med management appointments    Valet parking services will be available as well.

## 2023-05-01 DIAGNOSIS — M542 Cervicalgia: Secondary | ICD-10-CM | POA: Diagnosis not present

## 2023-05-01 DIAGNOSIS — M4316 Spondylolisthesis, lumbar region: Secondary | ICD-10-CM | POA: Diagnosis not present

## 2023-05-29 ENCOUNTER — Other Ambulatory Visit: Payer: Self-pay | Admitting: Family Medicine

## 2023-05-30 ENCOUNTER — Other Ambulatory Visit: Payer: Self-pay | Admitting: Family Medicine

## 2023-05-30 DIAGNOSIS — M1A0720 Idiopathic chronic gout, left ankle and foot, without tophus (tophi): Secondary | ICD-10-CM

## 2023-05-30 NOTE — Telephone Encounter (Signed)
 Prescription Request  05/30/2023  LOV: 10/31/2022  What is the name of the medication or equipment? Allopurinol  100 mg  Have you contacted your pharmacy to request a refill? Yes   Which pharmacy would you like this sent to?  [id number] Jonette Nestle, [place] - [address] [place] [address] Phone: [phone] Fax: [phone]    Patient notified that their request is being sent to the clinical staff for review and that they should receive a response within 2 business days.   Please advise at [facility] [phone]

## 2023-06-02 NOTE — Telephone Encounter (Signed)
 Rx 04/03/23 #90- too soon Requested Prescriptions  Pending Prescriptions Disp Refills   allopurinol  (ZYLOPRIM ) 100 MG tablet 90 tablet 0    Sig: Take 1 tablet (100 mg total) by mouth daily.     Endocrinology:  Gout Agents - allopurinol  Failed - 06/02/2023  1:47 PM      Failed - Uric Acid in normal range and within 360 days    Uric Acid  Date Value Ref Range Status  01/10/2022 5.7 3.0 - 7.2 mg/dL Final    Comment:               Therapeutic target for gout patients: <6.0         Failed - Cr in normal range and within 360 days    Creat  Date Value Ref Range Status  10/31/2022 1.32 (H) 0.50 - 1.05 mg/dL Final         Passed - Valid encounter within last 12 months    Recent Outpatient Visits           7 months ago Mixed hyperlipidemia   [facility] Austine Lefort, MD   10 months ago Mixed hyperlipidemia   [place] [facility] Pickard, Cisco Crest, MD   1 year ago Prediabetes   [place] [facility] Pickard, Cisco Crest, MD              Passed - CBC within normal limits and completed in the last 12 months    WBC  Date Value Ref Range Status  10/31/2022 7.4 3.8 - 10.8 Thousand/uL Final   RBC  Date Value Ref Range Status  10/31/2022 4.84 3.80 - 5.10 Million/uL Final   Hemoglobin  Date Value Ref Range Status  10/31/2022 14.8 11.7 - 15.5 g/dL Final  16/10/9602 54.0 11.1 - 15.9 g/dL Final   HCT  Date Value Ref Range Status  10/31/2022 45.8 (H) 35.0 - 45.0 % Final   Hematocrit  Date Value Ref Range Status  01/10/2022 41.6 34.0 - 46.6 % Final   MCHC  Date Value Ref Range Status  10/31/2022 32.3 32.0 - 36.0 g/dL Final    Comment:    For adults, a slight decrease in the calculated MCHC value (in the range of 30 to 32 g/dL) is most likely not clinically significant; however, it should be interpreted with caution in correlation with other red cell parameters and the patient's clinical condition.     [facility]  Date Value Ref Range Status  10/31/2022 30.6 27.0 - 33.0 pg Final   MCV  Date Value Ref Range Status  10/31/2022 94.6 80.0 - 100.0 fL Final  01/10/2022 95 79 - 97 fL Final   No results found for: "PLTCOUNTKUC", "LABPLAT", "POCPLA" RDW  Date Value Ref Range Status  10/31/2022 13.6 11.0 - 15.0 % Final  01/10/2022 14.4 11.7 - 15.4 % Final

## 2023-06-02 NOTE — Telephone Encounter (Signed)
 Requested Prescriptions  Pending Prescriptions Disp Refills   rosuvastatin  (CRESTOR ) 20 MG tablet [Pharmacy Med Name: ROSUVASTATIN  CALCIUM  20 MG TAB] 90 tablet 3    Sig: TAKE 1 TABLET BY MOUTH EVERY DAY     Cardiovascular:  Antilipid - Statins 2 Failed - 06/02/2023 10:23 AM      Failed - Cr in normal range and within 360 days    Creat  Date Value Ref Range Status  10/31/2022 1.32 (H) 0.50 - 1.05 mg/dL Final         Failed - Lipid Panel in normal range within the last 12 months    Cholesterol, Total  Date Value Ref Range Status  01/10/2022 177 100 - 199 mg/dL Final   Cholesterol  Date Value Ref Range Status  10/31/2022 136 <200 mg/dL Final   LDL Cholesterol (Calc)  Date Value Ref Range Status  10/31/2022 72 mg/dL (calc) Final    Comment:    Reference range: <100 . Desirable range <100 mg/dL for primary prevention;   <70 mg/dL for patients with CHD or diabetic patients  with > or = 2 CHD risk factors. Aaron Aas LDL-C is now calculated using the Martin-Hopkins  calculation, which is a validated novel method providing  better accuracy than the Friedewald equation in the  estimation of LDL-C.  Melinda Sprawls et al. Erroll Heard. 5784;696(29): 2061-2068  (http://education.[employer].com/faq/FAQ164)    Direct LDL  Date Value Ref Range Status  09/06/2013 174 (H) mg/dL Final    Comment:    ATP III Classification (LDL):       < 100        mg/dL         Optimal      528 - 129     mg/dL         Near or Above Optimal      130 - 159     mg/dL         Borderline High      160 - 189     mg/dL         High       > 413        mg/dL         Very High     HDL  Date Value Ref Range Status  10/31/2022 39 (L) > OR = 50 mg/dL Final  24/40/1027 33 (L) >39 mg/dL Final   Triglycerides  Date Value Ref Range Status  10/31/2022 174 (H) <150 mg/dL Final         Passed - Patient is not pregnant      Passed - Valid encounter within last 12 months    Recent Outpatient Visits           7 months ago  Mixed hyperlipidemia   [place] [facility] Pickard, Cisco Crest, MD   10 months ago Mixed hyperlipidemia   [place] [facility] Pickard, Cisco Crest, MD   1 year ago Prediabetes   [place] [facility] Pickard, Cisco Crest, MD               hydrOXYzine  (VISTARIL ) 25 MG capsule [Pharmacy Med Name: HYDROXYZINE  PAM 25 MG CAP] 30 capsule 2    Sig: TAKE 1 CAPSULE (25 MG TOTAL) BY MOUTH EVERY 8 (EIGHT) HOURS AS NEEDED.     Ear, Nose, and Throat:  Antihistamines 2 Failed - 06/02/2023 10:23 AM      Failed - Cr in normal range  and within 360 days    Creat  Date Value Ref Range Status  10/31/2022 1.32 (H) 0.50 - 1.05 mg/dL Final         Passed - Valid encounter within last 12 months    Recent Outpatient Visits           7 months ago Mixed hyperlipidemia   [facility] Pickard, Cisco Crest, MD   10 months ago Mixed hyperlipidemia   [place] [facility] Pickard, Cisco Crest, MD   1 year ago Prediabetes   [place] [facility] Pickard, Cisco Crest, MD

## 2023-06-02 NOTE — Telephone Encounter (Signed)
 Requested medications are due for refill today.  unsure  Requested medications are on the active medications list.  yes  Last refill. 07/30/2022 #90 3 rf  Future visit scheduled.   yes  Notes to clinic.  Rx was written to expire 10/28/2022 - rx is expired.    Requested Prescriptions  Pending Prescriptions Disp Refills   rosuvastatin  (CRESTOR ) 20 MG tablet [Pharmacy Med Name: ROSUVASTATIN  CALCIUM  20 MG TAB] 90 tablet 3    Sig: TAKE 1 TABLET BY MOUTH EVERY DAY     Cardiovascular:  Antilipid - Statins 2 Failed - 06/02/2023 10:23 AM      Failed - Cr in normal range and within 360 days    Creat  Date Value Ref Range Status  10/31/2022 1.32 (H) 0.50 - 1.05 mg/dL Final         Failed - Lipid Panel in normal range within the last 12 months    Cholesterol, Total  Date Value Ref Range Status  01/10/2022 177 100 - 199 mg/dL Final   Cholesterol  Date Value Ref Range Status  10/31/2022 136 <200 mg/dL Final   LDL Cholesterol (Calc)  Date Value Ref Range Status  10/31/2022 72 mg/dL (calc) Final    Comment:    Reference range: <100 . Desirable range <100 mg/dL for primary prevention;   <70 mg/dL for patients with CHD or diabetic patients  with > or = 2 CHD risk factors. Aaron Aas LDL-C is now calculated using the Martin-Hopkins  calculation, which is a validated novel method providing  better accuracy than the Friedewald equation in the  estimation of LDL-C.  Melinda Sprawls et al. Erroll Heard. 9604;540(98): 2061-2068  (http://education.[facility].com/faq/FAQ164)    Direct LDL  Date Value Ref Range Status  09/06/2013 174 (H) mg/dL Final    Comment:    ATP III Classification (LDL):       < 100        mg/dL         Optimal      119 - 129     mg/dL         Near or Above Optimal      130 - 159     mg/dL         Borderline High      160 - 189     mg/dL         High       > 147        mg/dL         Very High     HDL  Date Value Ref Range Status  10/31/2022 39 (L) > OR = 50 mg/dL Final   82/95/6213 33 (L) >39 mg/dL Final   Triglycerides  Date Value Ref Range Status  10/31/2022 174 (H) <150 mg/dL Final         Passed - Patient is not pregnant      Passed - Valid encounter within last 12 months    Recent Outpatient Visits           7 months ago Mixed hyperlipidemia   [place] [facility] Pickard, Cisco Crest, MD   10 months ago Mixed hyperlipidemia   [place] [facility] Pickard, Cisco Crest, MD   1 year ago Prediabetes   [place] [facility] Austine Lefort, MD              Signed Prescriptions Disp Refills   hydrOXYzine  (VISTARIL ) 25 MG  capsule 30 capsule 2    Sig: TAKE 1 CAPSULE (25 MG TOTAL) BY MOUTH EVERY 8 (EIGHT) HOURS AS NEEDED.     Ear, Nose, and Throat:  Antihistamines 2 Failed - 06/02/2023 10:23 AM      Failed - Cr in normal range and within 360 days    Creat  Date Value Ref Range Status  10/31/2022 1.32 (H) 0.50 - 1.05 mg/dL Final         Passed - Valid encounter within last 12 months    Recent Outpatient Visits           7 months ago Mixed hyperlipidemia   [place] [facility] Pickard, Cisco Crest, MD   10 months ago Mixed hyperlipidemia   [place] [facility] Pickard, Cisco Crest, MD   1 year ago Prediabetes   [place] [facility] Pickard, Cisco Crest, MD

## 2023-06-04 ENCOUNTER — Other Ambulatory Visit: Payer: Self-pay | Admitting: Family Medicine

## 2023-06-04 DIAGNOSIS — M1A0720 Idiopathic chronic gout, left ankle and foot, without tophus (tophi): Secondary | ICD-10-CM

## 2023-06-04 NOTE — Telephone Encounter (Signed)
 Prescription Request  06/04/2023  LOV: 10/31/2022  What is the name of the medication or equipment? allopurinol  (ZYLOPRIM ) 100 MG tablet   Have you contacted your pharmacy to request a refill? Yes   Which pharmacy would you like this sent to?  [id number] [place], [place] - [address] [place] [address] Phone: [phone] Fax: [phone]    Patient notified that their request is being sent to the clinical staff for review and that they should receive a response within 2 business days.   Please advise at [facility] [phone]

## 2023-06-05 MED ORDER — ALLOPURINOL 100 MG PO TABS: 100.0000 mg | ORAL_TABLET | Freq: Every day | ORAL | 0 refills | Status: DC

## 2023-06-05 NOTE — Telephone Encounter (Signed)
 Called pt and lmom. Pt has requested this medication 2x and per last refill, she should still have mediation left. Last refill  04/03/2023 #90 0 rf

## 2023-06-05 NOTE — Telephone Encounter (Signed)
 Requested Prescriptions  Pending Prescriptions Disp Refills   allopurinol  (ZYLOPRIM ) 100 MG tablet 90 tablet 0    Sig: Take 1 tablet (100 mg total) by mouth daily.     Endocrinology:  Gout Agents - allopurinol  Failed - 06/05/2023  3:02 PM      Failed - Uric Acid in normal range and within 360 days    Uric Acid  Date Value Ref Range Status  01/10/2022 5.7 3.0 - 7.2 mg/dL Final    Comment:               Therapeutic target for gout patients: <6.0         Failed - Cr in normal range and within 360 days    Creat  Date Value Ref Range Status  10/31/2022 1.32 (H) 0.50 - 1.05 mg/dL Final         Passed - Valid encounter within last 12 months    Recent Outpatient Visits           7 months ago Mixed hyperlipidemia   [place] [facility] Austine Lefort, MD   10 months ago Mixed hyperlipidemia   [place] [facility] Pickard, Cisco Crest, MD   1 year ago Prediabetes   [place] [facility] Pickard, Cisco Crest, MD              Passed - CBC within normal limits and completed in the last 12 months    WBC  Date Value Ref Range Status  10/31/2022 7.4 3.8 - 10.8 Thousand/uL Final   RBC  Date Value Ref Range Status  10/31/2022 4.84 3.80 - 5.10 Million/uL Final   Hemoglobin  Date Value Ref Range Status  10/31/2022 14.8 11.7 - 15.5 g/dL Final  82/95/6213 08.6 11.1 - 15.9 g/dL Final   HCT  Date Value Ref Range Status  10/31/2022 45.8 (H) 35.0 - 45.0 % Final   Hematocrit  Date Value Ref Range Status  01/10/2022 41.6 34.0 - 46.6 % Final   MCHC  Date Value Ref Range Status  10/31/2022 32.3 32.0 - 36.0 g/dL Final    Comment:    For adults, a slight decrease in the calculated MCHC value (in the range of 30 to 32 g/dL) is most likely not clinically significant; however, it should be interpreted with caution in correlation with other red cell parameters and the patient's clinical condition.    [facility]  Date Value Ref Range  Status  10/31/2022 30.6 27.0 - 33.0 pg Final   MCV  Date Value Ref Range Status  10/31/2022 94.6 80.0 - 100.0 fL Final  01/10/2022 95 79 - 97 fL Final   No results found for: "PLTCOUNTKUC", "LABPLAT", "POCPLA" RDW  Date Value Ref Range Status  10/31/2022 13.6 11.0 - 15.0 % Final  01/10/2022 14.4 11.7 - 15.4 % Final

## 2023-06-19 ENCOUNTER — Ambulatory Visit: Payer: 59

## 2023-07-08 ENCOUNTER — Other Ambulatory Visit: Payer: Self-pay | Admitting: Family Medicine

## 2023-07-08 DIAGNOSIS — E039 Hypothyroidism, unspecified: Secondary | ICD-10-CM

## 2023-07-17 DIAGNOSIS — M4316 Spondylolisthesis, lumbar region: Secondary | ICD-10-CM | POA: Diagnosis not present

## 2023-07-17 DIAGNOSIS — M542 Cervicalgia: Secondary | ICD-10-CM | POA: Diagnosis not present

## 2023-07-17 DIAGNOSIS — M5412 Radiculopathy, cervical region: Secondary | ICD-10-CM | POA: Diagnosis not present

## 2023-08-05 ENCOUNTER — Telehealth: Payer: Self-pay | Admitting: Family Medicine

## 2023-08-05 ENCOUNTER — Other Ambulatory Visit: Payer: Self-pay | Admitting: Family Medicine

## 2023-08-05 DIAGNOSIS — L304 Erythema intertrigo: Secondary | ICD-10-CM

## 2023-08-05 NOTE — Telephone Encounter (Signed)
 Prescription Request  08/05/2023  LOV: 10/31/2022  What is the name of the medication or equipment?   allopurinol  (ZYLOPRIM ) 100 MG tablet  **90 day script request**  Have you contacted your pharmacy to request a refill? Yes   Which pharmacy would you like this sent to?  [id number] [place], [place] - [address] [place] [address] Phone: [phone] Fax: [phone]    Patient notified that their request is being sent to the clinical staff for review and that they should receive a response within 2 business days.   Please advise pharmacist.

## 2023-08-06 ENCOUNTER — Other Ambulatory Visit: Payer: Self-pay

## 2023-08-06 DIAGNOSIS — M1A0720 Idiopathic chronic gout, left ankle and foot, without tophus (tophi): Secondary | ICD-10-CM

## 2023-08-06 MED ORDER — ALLOPURINOL 100 MG PO TABS: 100.0000 mg | ORAL_TABLET | Freq: Every day | ORAL | 0 refills | Status: DC

## 2023-08-06 NOTE — Telephone Encounter (Signed)
Left message to return call; need to schedule med refill appointment.

## 2023-08-22 DIAGNOSIS — M4316 Spondylolisthesis, lumbar region: Secondary | ICD-10-CM | POA: Diagnosis not present

## 2023-08-22 DIAGNOSIS — M5414 Radiculopathy, thoracic region: Secondary | ICD-10-CM | POA: Diagnosis not present

## 2023-08-22 DIAGNOSIS — G8929 Other chronic pain: Secondary | ICD-10-CM | POA: Diagnosis not present

## 2023-08-22 DIAGNOSIS — M546 Pain in thoracic spine: Secondary | ICD-10-CM | POA: Diagnosis not present

## 2023-08-26 ENCOUNTER — Ambulatory Visit: Payer: Self-pay

## 2023-08-26 NOTE — Telephone Encounter (Signed)
 FYI Only or Action Required?: FYI only for provider.  Patient was last seen in primary care on 10/31/2022 by Duanne Butler DASEN, MD.  Called Nurse Triage reporting Pain.  Symptoms began 1986 - worsening past 6 months. Dark urine, off odor  Interventions attempted: Nothing.  Symptoms are: unchanged.  Triage Disposition: See PCP When Office is Open (Within 3 Days)  Patient/caregiver understands and will follow disposition?: Yes               Copied from CRM #[id number]. Topic: Clinical - Red Word Triage >> Aug 26, 2023  4:04 PM Avram MATSU wrote: Red Word that prompted transfer to Nurse Triage: pt stated fell last week in the bath tub and has some pain still.   ----------------------------------------------------------------------- From previous Reason for Contact - Scheduling: Patient/patient representative is calling to schedule an appointment. Refer to attachments for appointment information. Reason for Disposition  [1] MODERATE back pain (e.g., interferes with normal activities) AND [2] present > 3 days  Answer Assessment - Initial Assessment Questions Pt was sent to nurse triage for back pain , and fall in the bathtub Friday morning. Pt states that back pain has been there since 1986. She thinks it has gotten worse the last 6 months. She states that sometimes her urine is dark and has an off odor. Pt reports hurting her elbow in bathroom fall, but thinks it is fine.  No Frequency urgency or burning with urination.  1. ONSET: When did the pain begin? (e.g., minutes, hours, days)    1986 - recently gotten worse 2. LOCATION: Where does it hurt? (upper, mid or lower back)     Lower back and sides 3. SEVERITY: How bad is the pain?  (e.g., Scale 1-10; mild, moderate, or severe)     moderate 4. PATTERN: Is the pain constant? (e.g., yes, no; constant, intermittent)      yes  6. CAUSE:  What do you think is causing the back pain?      Ongoing back pain - May have a  kidney issue  Protocols used: Back Pain-A-AH

## 2023-08-28 ENCOUNTER — Ambulatory Visit: Admitting: Family Medicine

## 2023-09-02 ENCOUNTER — Ambulatory Visit: Admitting: Family Medicine

## 2023-09-04 ENCOUNTER — Telehealth: Payer: Self-pay | Admitting: Family Medicine

## 2023-09-04 NOTE — Telephone Encounter (Signed)
 Prescription Request  09/04/2023  LOV: 10/31/2022  What is the name of the medication or equipment?   hydrOXYzine  (VISTARIL ) 25 MG capsule   Have you contacted your pharmacy to request a refill? Yes   Which pharmacy would you like this sent to?  [id number] [place], [place] - [address]  [place] [address] Phone: [phone] Fax: [phone]    Patient notified that their request is being sent to the clinical staff for review and that they should receive a response within 2 business days.   Please advise pharmacist.

## 2023-09-08 ENCOUNTER — Ambulatory Visit: Admitting: Family Medicine

## 2023-09-08 ENCOUNTER — Other Ambulatory Visit: Payer: Self-pay | Admitting: Family Medicine

## 2023-09-08 NOTE — Telephone Encounter (Signed)
 Prescription Request  09/08/2023  LOV: 10/31/2022  What is the name of the medication or equipment?   hydrOXYzine  (VISTARIL ) 25 MG capsule   Have you contacted your pharmacy to request a refill? Yes   Which pharmacy would you like this sent to?  [id number] [place], [place] - [address] [place]  [address] Phone: [phone] Fax: [phone]    Patient notified that their request is being sent to the clinical staff for review and that they should receive a response within 2 business days.   Please advise pharmacist

## 2023-09-10 ENCOUNTER — Ambulatory Visit: Admitting: Physical Therapy

## 2023-09-10 MED ORDER — HYDROXYZINE PAMOATE 25 MG PO CAPS: 25.0000 mg | ORAL_CAPSULE | Freq: Three times a day (TID) | ORAL | 2 refills | Status: DC | PRN

## 2023-09-10 NOTE — Telephone Encounter (Signed)
 Requested Prescriptions  Pending Prescriptions Disp Refills   hydrOXYzine  (VISTARIL ) 25 MG capsule 30 capsule 2    Sig: Take 1 capsule (25 mg total) by mouth every 8 (eight) hours as needed.     Ear, Nose, and Throat:  Antihistamines 2 Failed - 09/10/2023  1:42 PM      Failed - Cr in normal range and within 360 days    Creat  Date Value Ref Range Status  10/31/2022 1.32 (H) 0.50 - 1.05 mg/dL Final         Passed - Valid encounter within last 12 months    Recent Outpatient Visits           10 months ago Mixed hyperlipidemia   [place] [facility] Pickard, Butler DASEN, MD   1 year ago Mixed hyperlipidemia   [place] [facility] Pickard, Butler DASEN, MD   1 year ago Prediabetes   [facility] Pickard, Butler DASEN, MD

## 2023-09-11 ENCOUNTER — Ambulatory Visit

## 2023-09-24 ENCOUNTER — Ambulatory Visit

## 2023-10-08 ENCOUNTER — Ambulatory Visit (INDEPENDENT_AMBULATORY_CARE_PROVIDER_SITE_OTHER)

## 2023-10-08 VITALS — Ht 64.0 in | Wt 194.0 lb

## 2023-10-08 DIAGNOSIS — Z0000 Encounter for general adult medical examination without abnormal findings: Secondary | ICD-10-CM | POA: Diagnosis not present

## 2023-10-08 NOTE — Patient Instructions (Signed)
 [name],  Thank you for taking the time for your Medicare Wellness Visit. I appreciate your continued commitment to your health goals. Please review the care plan we discussed, and feel free to reach out if I can assist you further.  Medicare recommends these wellness visits once per year to help you and your care team stay ahead of potential health issues. These visits are designed to focus on prevention, allowing your provider to concentrate on managing your acute and chronic conditions during your regular appointments.  Please note that Annual Wellness Visits do not include a physical exam. Some assessments may be limited, especially if the visit was conducted virtually. If needed, we may recommend a separate in-person follow-up with your provider.  Ongoing Care Seeing your primary care provider every 3 to 6 months helps us  monitor your health and provide consistent, personalized care.   Referrals If a referral was made during today's visit and you haven't received any updates within two weeks, please contact the referred provider directly to check on the status.  Recommended Screenings:  Health Maintenance  Topic Date Due   Pneumococcal Vaccine for age over 73 (1 of 2 - PCV) Never done   Zoster (Shingles) Vaccine (1 of 2) Never done   Pap with HPV screening  09/22/2017   Flu Shot  08/22/2023   COVID-19 Vaccine (4 - 2025-26 season) 09/22/2023   Stool Blood Test  12/13/2023*   Screening for Lung Cancer  06/11/2024*   HIV Screening  06/11/2024*   Breast Cancer Screening  09/03/2024   Medicare Annual Wellness Visit  10/07/2024   Hepatitis C Screening  Completed   Hepatitis B Vaccine  Aged Out   HPV Vaccine  Aged Out   Meningitis B Vaccine  Aged Out   DTaP/Tdap/Td vaccine  Discontinued   Colon Cancer Screening  Discontinued  *Topic was postponed. The date shown is not the original due date.       10/08/2023    2:42 PM  Advanced Directives  Does Patient Have a Medical Advance  Directive? No  Would patient like information on creating a medical advance directive? Yes (MAU/Ambulatory/Procedural Areas - Information given)   Advance Care Planning is important because it: Ensures you receive medical care that aligns with your values, goals, and preferences. Provides guidance to your family and loved ones, reducing the emotional burden of decision-making during critical moments.  Information on Advanced Care Planning can be found at [place]  Secretary of [facility] Advance Health Care Directives Advance Health Care Directives ([email or web address]   Vision: Annual vision screenings are recommended for early detection of glaucoma, cataracts, and diabetic retinopathy. These exams can also reveal signs of chronic conditions such as diabetes and high blood pressure.  Dental: Annual dental screenings help detect early signs of oral cancer, gum disease, and other conditions linked to overall health, including heart disease and diabetes.  Please see the attached documents for additional preventive care recommendations.
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
## 2023-10-10 ENCOUNTER — Other Ambulatory Visit: Payer: Self-pay

## 2023-10-10 ENCOUNTER — Telehealth: Payer: Self-pay | Admitting: Family Medicine

## 2023-10-10 DIAGNOSIS — E782 Mixed hyperlipidemia: Secondary | ICD-10-CM

## 2023-10-10 MED ORDER — ROSUVASTATIN CALCIUM 20 MG PO TABS: 20.0000 mg | ORAL_TABLET | Freq: Every day | ORAL | 0 refills | Status: DC

## 2023-10-10 NOTE — Telephone Encounter (Signed)
 Prescription Request  10/10/2023  LOV: 10/31/2022  What is the name of the medication or equipment?   rosuvastatin  (CRESTOR ) 20 MG tablet  **90 day script requested**  Have you contacted your pharmacy to request a refill? Yes   Which pharmacy would you like this sent to?  [id number] [address], [place] - [address] [place] [address] Phone: [phone] Fax: [phone]    Patient notified that their request is being sent to the clinical staff for review and that they should receive a response within 2 business days.   Please advise pharmacist.

## 2023-10-31 ENCOUNTER — Other Ambulatory Visit: Payer: Self-pay | Admitting: Family Medicine

## 2023-10-31 DIAGNOSIS — M1A0720 Idiopathic chronic gout, left ankle and foot, without tophus (tophi): Secondary | ICD-10-CM

## 2023-11-06 DIAGNOSIS — M5414 Radiculopathy, thoracic region: Secondary | ICD-10-CM | POA: Diagnosis not present

## 2023-11-06 DIAGNOSIS — M4316 Spondylolisthesis, lumbar region: Secondary | ICD-10-CM | POA: Diagnosis not present

## 2023-11-14 ENCOUNTER — Other Ambulatory Visit: Payer: Self-pay | Admitting: Family Medicine

## 2023-11-17 NOTE — Telephone Encounter (Signed)
 Requested medications are due for refill today.  unsure  Requested medications are on the active medications list.  yes  Last refill. 07/30/2022 #30 2 rf  Future visit scheduled.   yes  Notes to clinic.  Last refilled 07/2022 - please review for refill.    Requested Prescriptions  Pending Prescriptions Disp Refills   MOVANTIK  25 MG TABS tablet [Pharmacy Med Name: MOVANTIK  25 MG TABLET] 30 tablet 2    Sig: Take 1 tablet (25 mg total) by mouth daily.     Gastroenterology: Opioid-Induced Constipation - naloxegol  Failed - 11/17/2023  2:01 PM      Failed - Cr in normal range and within 360 days    Creat  Date Value Ref Range Status  10/31/2022 1.32 (H) 0.50 - 1.05 mg/dL Final         Passed - Valid encounter within last 12 months    Recent Outpatient Visits           1 year ago Mixed hyperlipidemia   [place] [facility] Pickard, Butler DASEN, MD   1 year ago Mixed hyperlipidemia   [place] [facility] Pickard, Butler DASEN, MD   1 year ago Prediabetes   [place] [facility] Pickard, Butler DASEN, MD

## 2023-11-20 ENCOUNTER — Ambulatory Visit (INDEPENDENT_AMBULATORY_CARE_PROVIDER_SITE_OTHER): Admitting: Family Medicine

## 2023-11-20 ENCOUNTER — Encounter: Payer: Self-pay | Admitting: Family Medicine

## 2023-11-20 VITALS — BP 118/62 | HR 60 | Temp 98.4°F | Ht 64.0 in | Wt 194.0 lb

## 2023-11-20 DIAGNOSIS — Z23 Encounter for immunization: Secondary | ICD-10-CM | POA: Diagnosis not present

## 2023-11-20 DIAGNOSIS — E039 Hypothyroidism, unspecified: Secondary | ICD-10-CM | POA: Diagnosis not present

## 2023-11-20 DIAGNOSIS — N1831 Chronic kidney disease, stage 3a: Secondary | ICD-10-CM

## 2023-11-20 DIAGNOSIS — R052 Subacute cough: Secondary | ICD-10-CM | POA: Diagnosis not present

## 2023-11-20 DIAGNOSIS — G894 Chronic pain syndrome: Secondary | ICD-10-CM

## 2023-11-20 DIAGNOSIS — E119 Type 2 diabetes mellitus without complications: Secondary | ICD-10-CM | POA: Diagnosis not present

## 2023-11-20 LAB — CBC WITH DIFFERENTIAL/PLATELET
Absolute Lymphocytes: 2149 {cells}/uL (ref 850–3900)
Absolute Monocytes: 427 {cells}/uL (ref 200–950)
Basophils Absolute: 28 {cells}/uL (ref 0–200)
Basophils Relative: 0.4 %
Eosinophils Absolute: 280 {cells}/uL (ref 15–500)
Eosinophils Relative: 4 %
HCT: 48 % — ABNORMAL HIGH (ref 35.0–45.0)
Hemoglobin: 15.5 g/dL (ref 11.7–15.5)
MCH: 31.3 pg (ref 27.0–33.0)
MCHC: 32.3 g/dL (ref 32.0–36.0)
MCV: 97 fL (ref 80.0–100.0)
MPV: 9.6 fL (ref 7.5–12.5)
Monocytes Relative: 6.1 %
Neutro Abs: 4116 {cells}/uL (ref 1500–7800)
Neutrophils Relative %: 58.8 %
Platelets: 287 Thousand/uL (ref 140–400)
RBC: 4.95 Million/uL (ref 3.80–5.10)
RDW: 13.9 % (ref 11.0–15.0)
Total Lymphocyte: 30.7 %
WBC: 7 Thousand/uL (ref 3.8–10.8)

## 2023-11-20 LAB — COMPREHENSIVE METABOLIC PANEL WITH GFR
AG Ratio: 2 (calc) (ref 1.0–2.5)
ALT: 8 U/L (ref 6–29)
AST: 13 U/L (ref 10–35)
Albumin: 4.3 g/dL (ref 3.6–5.1)
Alkaline phosphatase (APISO): 91 U/L (ref 37–153)
BUN/Creatinine Ratio: 14 (calc) (ref 6–22)
BUN: 17 mg/dL (ref 7–25)
CO2: 33 mmol/L — ABNORMAL HIGH (ref 20–32)
Calcium: 9.1 mg/dL (ref 8.6–10.4)
Chloride: 101 mmol/L (ref 98–110)
Creat: 1.24 mg/dL — ABNORMAL HIGH (ref 0.50–1.05)
Globulin: 2.2 g/dL (ref 1.9–3.7)
Glucose, Bld: 114 mg/dL — ABNORMAL HIGH (ref 65–99)
Potassium: 4.7 mmol/L (ref 3.5–5.3)
Sodium: 140 mmol/L (ref 135–146)
Total Bilirubin: 0.4 mg/dL (ref 0.2–1.2)
Total Protein: 6.5 g/dL (ref 6.1–8.1)
eGFR: 49 mL/min/1.73m2 — ABNORMAL LOW (ref >=60)

## 2023-11-20 LAB — LIPID PANEL
Cholesterol: 148 mg/dL (ref <200)
HDL: 43 mg/dL — ABNORMAL LOW (ref >=50)
LDL Cholesterol (Calc): 72 mg/dL
Non-HDL Cholesterol (Calc): 105 mg/dL (ref <130)
Total CHOL/HDL Ratio: 3.4 (calc) (ref <5.0)
Triglycerides: 253 mg/dL — ABNORMAL HIGH (ref <150)

## 2023-11-20 LAB — TSH: TSH: 1.08 m[IU]/L (ref 0.40–4.50)

## 2023-11-20 LAB — HEMOGLOBIN A1C
Hgb A1c MFr Bld: 5.9 % — ABNORMAL HIGH (ref <5.7)
Mean Plasma Glucose: 123 mg/dL
eAG (mmol/L): 6.8 mmol/L

## 2023-11-20 MED ORDER — AZITHROMYCIN 250 MG PO TABS: ORAL_TABLET | ORAL | 0 refills | Status: DC
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
## 2023-11-21 ENCOUNTER — Ambulatory Visit: Payer: Self-pay | Admitting: Family Medicine

## 2024-01-01 ENCOUNTER — Other Ambulatory Visit: Payer: Self-pay | Admitting: Family Medicine

## 2024-01-03 ENCOUNTER — Other Ambulatory Visit: Payer: Self-pay | Admitting: Family Medicine

## 2024-01-10 ENCOUNTER — Other Ambulatory Visit: Payer: Self-pay | Admitting: Family Medicine

## 2024-01-12 ENCOUNTER — Other Ambulatory Visit: Payer: Self-pay

## 2024-01-12 ENCOUNTER — Telehealth: Payer: Self-pay

## 2024-01-12 DIAGNOSIS — L304 Erythema intertrigo: Secondary | ICD-10-CM

## 2024-01-12 MED ORDER — NYSTATIN 100000 UNIT/GM EX CREA: TOPICAL_CREAM | Freq: Three times a day (TID) | CUTANEOUS | 2 refills | Status: DC

## 2024-01-12 NOTE — Telephone Encounter (Signed)
,  Sent in medication.

## 2024-01-12 NOTE — Telephone Encounter (Signed)
 Prescription Request  01/12/2024  LOV: 11/20/23  What is the name of the medication or equipment? nystatin  cream (MYCOSTATIN ) [[id number]]   Have you contacted your pharmacy to request a refill? Yes   Which pharmacy would you like this sent to?  [id number] [address], [place] - [address] [place] [address] Phone: [phone] Fax: [phone]    Patient notified that their request is being sent to the clinical staff for review and that they should receive a response within 2 business days.   Please advise at [facility] [phone]

## 2024-01-14 ENCOUNTER — Other Ambulatory Visit: Payer: Self-pay | Admitting: Cardiology

## 2024-01-14 NOTE — Telephone Encounter (Signed)
 Requested medication (s) are due for refill today:   Requested medication (s) are on the active medication list: Yes  Last refill:  Zofran   11/26/22   Azithromycin   11/20/23  Future visit scheduled: Yes  Notes to clinic:  See request.    Requested Prescriptions  Pending Prescriptions Disp Refills   azithromycin  (ZITHROMAX ) 250 MG tablet [Pharmacy Med Name: AZITHROMYCIN  250 MG TABLET] 6 tablet 0    Sig: TAKE 2 TABLETS BY MOUTH TODAY, THEN TAKE 1 TABLET DAILY FOR 4 DAYS AS DIRECTED     Off-Protocol Failed - 01/14/2024  9:32 AM      Failed - Medication not assigned to a protocol, review manually.      Passed - Valid encounter within last 12 months    Recent Outpatient Visits           1 month ago Stage 3a chronic kidney disease (HCC)   [place] [facility] Pickard, Butler DASEN, MD   1 year ago Mixed hyperlipidemia   [place] [facility] Duanne Butler DASEN, MD   1 year ago Mixed hyperlipidemia   [place] [facility] Duanne Butler DASEN, MD   1 year ago Prediabetes   [facility] Pickard, Butler DASEN, MD               ondansetron  (ZOFRAN ) 4 MG tablet [Pharmacy Med Name: ONDANSETRON  HCL 4 MG TABLET] 20 tablet 2    Sig: TAKE 1 TABLET BY MOUTH EVERY 8 HOURS AS NEEDED FOR NAUSEA AND VOMITING     Not Delegated - Gastroenterology: Antiemetics - ondansetron  Failed - 01/14/2024  9:32 AM      Failed - This refill cannot be delegated      Passed - AST in normal range and within 360 days    AST  Date Value Ref Range Status  11/20/2023 13 10 - 35 U/L Final         Passed - ALT in normal range and within 360 days    ALT  Date Value Ref Range Status  11/20/2023 8 6 - 29 U/L Final         Passed - Valid encounter within last 6 months    Recent Outpatient Visits           1 month ago Stage 3a chronic kidney disease (HCC)   [place] [name] Pickard, Butler DASEN, MD   1 year  ago Mixed hyperlipidemia   [name] Pickard, Butler DASEN, MD   1 year ago Mixed hyperlipidemia   [name] [facility] Duanne, Butler DASEN, MD   1 year ago Prediabetes   [place] [facility] Pickard, Butler DASEN, MD

## 2024-02-06 ENCOUNTER — Other Ambulatory Visit: Payer: Self-pay

## 2024-02-06 DIAGNOSIS — R002 Palpitations: Secondary | ICD-10-CM

## 2024-02-09 MED ORDER — METOPROLOL TARTRATE 25 MG PO TABS: 25.0000 mg | ORAL_TABLET | Freq: Every day | ORAL | 0 refills | Status: DC | PRN

## 2024-02-09 MED ORDER — METOPROLOL SUCCINATE ER 50 MG PO TB24: 50.0000 mg | ORAL_TABLET | Freq: Every day | ORAL | 0 refills | Status: DC

## 2024-02-11 ENCOUNTER — Other Ambulatory Visit: Payer: Self-pay | Admitting: Family Medicine

## 2024-02-11 ENCOUNTER — Telehealth: Payer: Self-pay

## 2024-02-11 ENCOUNTER — Other Ambulatory Visit: Payer: Self-pay

## 2024-02-11 DIAGNOSIS — E039 Hypothyroidism, unspecified: Secondary | ICD-10-CM

## 2024-02-11 DIAGNOSIS — E782 Mixed hyperlipidemia: Secondary | ICD-10-CM

## 2024-02-11 DIAGNOSIS — L304 Erythema intertrigo: Secondary | ICD-10-CM

## 2024-02-11 DIAGNOSIS — G4701 Insomnia due to medical condition: Secondary | ICD-10-CM

## 2024-02-11 DIAGNOSIS — R002 Palpitations: Secondary | ICD-10-CM

## 2024-02-11 DIAGNOSIS — M1A0720 Idiopathic chronic gout, left ankle and foot, without tophus (tophi): Secondary | ICD-10-CM

## 2024-02-11 MED ORDER — PANTOPRAZOLE SODIUM 40 MG PO TBEC: 40.0000 mg | DELAYED_RELEASE_TABLET | Freq: Every day | ORAL | 0 refills | Status: AC

## 2024-02-11 MED ORDER — HYDROXYZINE PAMOATE 25 MG PO CAPS: 25.0000 mg | ORAL_CAPSULE | Freq: Three times a day (TID) | ORAL | 2 refills | Status: AC | PRN

## 2024-02-11 MED ORDER — ROSUVASTATIN CALCIUM 20 MG PO TABS: 20.0000 mg | ORAL_TABLET | Freq: Every day | ORAL | 0 refills | Status: AC

## 2024-02-11 MED ORDER — ALLOPURINOL 100 MG PO TABS: 100.0000 mg | ORAL_TABLET | Freq: Every day | ORAL | 1 refills | Status: AC

## 2024-02-11 MED ORDER — LEVOTHYROXINE SODIUM 75 MCG PO TABS: 75.0000 ug | ORAL_TABLET | Freq: Every day | ORAL | 2 refills | Status: AC

## 2024-02-11 NOTE — Telephone Encounter (Signed)
 Fax received from [facility] for updated medication list. List updated and faxed back to [phone]. Mjp,lpn

## 2024-02-11 NOTE — Telephone Encounter (Signed)
 Requested medication (s) are due for refill today: see multiple historical medications  Requested medication (s) are on the active medication list: yes   Last refill:  see multiple historical medications and by other providers.  Future visit scheduled: no  Notes to clinic:  historical medications-vicks vapor inhaler,ASA,carboxymethylcellose,lasix , hydrocodone , requip. Not delegated per protocol- hydrocodone , zofran . Last ordered by Candice Coward, MD- colchichine, last ordered by Candice Josyallan, MD- gabapentin , not delegated per protocol- robaxin  last ordered Glaudin Gibbons, PA, toprol , lopressor - last ordered by Alm Clay, MD. Movantik , mycostatin - not delegated per protocol, trazodone - last ordered by Saline Agarual,MD. Please advise if you want to order these medications      Requested Prescriptions  Pending Prescriptions Disp Refills   Aromatic Inhalants (VICKS VAPOINHALER) INHA      Sig: One to two inhalations into each nostril at bedtime     There is no refill protocol information for this order     aspirin  EC 81 MG tablet 30 tablet     Sig: Take 1 tablet (81 mg total) by mouth at bedtime. STOP PRIOR TO PROCEDURE     Analgesics:  NSAIDS - aspirin  Failed - 02/11/2024  3:28 PM      Failed - Cr in normal range and within 360 days    Creat  Date Value Ref Range Status  11/20/2023 1.24 (H) 0.50 - 1.05 mg/dL Final         Passed - eGFR is 10 or above and within 360 days    GFR, Est African American  Date Value Ref Range Status  03/06/2016 58 (L) >=60 mL/min Final   GFR calc Af Amer  Date Value Ref Range Status  11/17/2019 50 (L) >59 mL/min/1.73 Final    Comment:    **In accordance with recommendations from the NKF-ASN Task force,**   [facility] is in the process of updating its eGFR calculation to the   2021 CKD-EPI creatinine equation that estimates kidney function   without a race variable.    GFR, Est Non African American  Date Value Ref Range Status  03/06/2016 50  (L) >=60 mL/min Final   GFR, Estimated  Date Value Ref Range Status  03/05/2021 50 (L) >60 mL/min Final    Comment:    (NOTE) Calculated using the CKD-EPI Creatinine Equation (2021)    eGFR  Date Value Ref Range Status  11/20/2023 49 (L) > OR = 60 mL/min/1.25m2 Final  01/10/2022 57 (L) >59 mL/min/1.73 Final         Passed - Patient is not pregnant      Passed - Valid encounter within last 12 months    Recent Outpatient Visits           2 months ago Stage 3a chronic kidney disease (HCC)   [place] [facility] Pickard, Butler DASEN, MD   1 year ago Mixed hyperlipidemia   [facility] [facility] Pickard, Butler DASEN, MD   1 year ago Mixed hyperlipidemia   [place] [facility] Duanne Butler DASEN, MD   1 year ago Prediabetes   [place] [facility] Pickard, Butler DASEN, MD               azithromycin  (ZITHROMAX ) 250 MG tablet 6 tablet 0    Sig: Take (2) tablets by mouth on day 1, then take (1) tablet by mouth on days 2-5.     Off-Protocol Failed - 02/11/2024  3:28 PM  Failed - Medication not assigned to a protocol, review manually.      Passed - Valid encounter within last 12 months    Recent Outpatient Visits           2 months ago Stage 3a chronic kidney disease (HCC)   [place] [facility] Pickard, Butler DASEN, MD   1 year ago Mixed hyperlipidemia   [place] [facility] Pickard, Butler DASEN, MD   1 year ago Mixed hyperlipidemia   [place] [facility] Pickard, Butler DASEN, MD   1 year ago Prediabetes   [facility] Pickard, Butler DASEN, MD               carboxymethylcellulose (REFRESH PLUS) 0.5 % SOLN      Sig: 2 drops 3 (three) times daily as needed.     Ophthalmology:  [place] - 02/11/2024  3:28 PM      Passed - Valid encounter within last 12 months    Recent Outpatient Visits           2 months  ago Stage 3a chronic kidney disease (HCC)   [place] [facility] Pickard, Butler DASEN, MD   1 year ago Mixed hyperlipidemia   [place] [facility] Duanne Butler DASEN, MD   1 year ago Mixed hyperlipidemia   [place] [facility] Duanne Butler DASEN, MD   1 year ago Prediabetes   [place] [facility] Pickard, Butler DASEN, MD               colchicine  0.6 MG tablet 30 tablet 5    Sig: TAKE 1 TABLET BY MOUTH EVERY DAY AS NEEDED FOR GOUT FLARES     Endocrinology:  Gout Agents - colchicine  Failed - 02/11/2024  3:28 PM      Failed - Cr in normal range and within 360 days    Creat  Date Value Ref Range Status  11/20/2023 1.24 (H) 0.50 - 1.05 mg/dL Final         Passed - ALT in normal range and within 360 days    ALT  Date Value Ref Range Status  11/20/2023 8 6 - 29 U/L Final         Passed - AST in normal range and within 360 days    AST  Date Value Ref Range Status  11/20/2023 13 10 - 35 U/L Final         Passed - Valid encounter within last 12 months    Recent Outpatient Visits           2 months ago Stage 3a chronic kidney disease (HCC)   [place] Pickard, Butler DASEN, MD   1 year ago Mixed hyperlipidemia   [place] [facility] Pickard, Butler DASEN, MD   1 year ago Mixed hyperlipidemia   [place] [facility] Pickard, Butler DASEN, MD   1 year ago Prediabetes   [place] [facility] Pickard, Butler DASEN, MD              Passed - CBC within normal limits and completed in the last 12 months    WBC  Date Value Ref Range Status  11/20/2023 7.0 3.8 - 10.8 Thousand/uL Final   RBC  Date Value Ref Range Status  11/20/2023 4.95 3.80 - 5.10 Million/uL Final  Hemoglobin  Date Value Ref Range Status  11/20/2023 15.5 11.7 - 15.5 g/dL Final  87/78/7976 86.6 11.1 - 15.9 g/dL Final   HCT  Date Value Ref Range Status   11/20/2023 48.0 (H) 35.0 - 45.0 % Final   Hematocrit  Date Value Ref Range Status  01/10/2022 41.6 34.0 - 46.6 % Final   MCHC  Date Value Ref Range Status  11/20/2023 32.3 32.0 - 36.0 g/dL Final    Comment:    For adults, a slight decrease in the calculated MCHC value (in the range of 30 to 32 g/dL) is most likely not clinically significant; however, it should be interpreted with caution in correlation with other red cell parameters and the patient's clinical condition.    [facility]  Date Value Ref Range Status  11/20/2023 31.3 27.0 - 33.0 pg Final   MCV  Date Value Ref Range Status  11/20/2023 97.0 80.0 - 100.0 fL Final  01/10/2022 95 79 - 97 fL Final   No results found for: PLTCOUNTKUC, LABPLAT, POCPLA RDW  Date Value Ref Range Status  11/20/2023 13.9 11.0 - 15.0 % Final  01/10/2022 14.4 11.7 - 15.4 % Final          furosemide  (LASIX ) 20 MG tablet 30 tablet     Sig: Take 1 tablet (20 mg total) by mouth daily.     Cardiovascular:  Diuretics - Loop Failed - 02/11/2024  3:28 PM      Failed - Cr in normal range and within 180 days    Creat  Date Value Ref Range Status  11/20/2023 1.24 (H) 0.50 - 1.05 mg/dL Final         Failed - Mg Level in normal range and within 180 days    Magnesium   Date Value Ref Range Status  12/21/2014 1.9 1.5 - 2.5 mg/dL Final         Passed - K in normal range and within 180 days    Potassium  Date Value Ref Range Status  11/20/2023 4.7 3.5 - 5.3 mmol/L Final         Passed - Ca in normal range and within 180 days    Calcium   Date Value Ref Range Status  11/20/2023 9.1 8.6 - 10.4 mg/dL Final         Passed - Na in normal range and within 180 days    Sodium  Date Value Ref Range Status  11/20/2023 140 135 - 146 mmol/L Final  01/10/2022 139 134 - 144 mmol/L Final         Passed - Cl in normal range and within 180 days    Chloride  Date Value Ref Range Status  11/20/2023 101 98 - 110 mmol/L Final         Passed - Last  BP in normal range    BP Readings from Last 1 Encounters:  11/20/23 118/62         Passed - Valid encounter within last 6 months    Recent Outpatient Visits           2 months ago Stage 3a chronic kidney disease (HCC)   [place] [facility] Duanne Butler DASEN, MD   1 year ago Mixed hyperlipidemia   [facility] Duanne Butler DASEN, MD   1 year ago Mixed hyperlipidemia   [place] [facility] Duanne Butler DASEN, MD   1 year ago Prediabetes    [facility] Duanne Butler DASEN, MD               gabapentin  (NEURONTIN ) 300 MG capsule 90 capsule 3    Sig: Take 1 capsule (300 mg total) by mouth at bedtime.     Neurology: Anticonvulsants - gabapentin  Failed - 02/11/2024  3:28 PM      Failed - Cr in normal range and within 360 days    Creat  Date Value Ref Range Status  11/20/2023 1.24 (H) 0.50 - 1.05 mg/dL Final         Passed - Completed PHQ-2 or PHQ-9 in the last 360 days      Passed - Valid encounter within last 12 months    Recent Outpatient Visits           2 months ago Stage 3a chronic kidney disease (HCC)   [place] [facility] Pickard, Butler DASEN, MD   1 year ago Mixed hyperlipidemia   [place] [facility] Pickard, Butler DASEN, MD   1 year ago Mixed hyperlipidemia   [place] [facility] Duanne Butler DASEN, MD   1 year ago Prediabetes   [place] [facility] Pickard, Butler DASEN, MD               HYDROcodone -acetaminophen  (NORCO) 7.5-325 MG tablet 30 tablet 0    Sig: Take 1 tablet by mouth every 6 (six) hours as needed for severe pain (pain score 7-10).     Not Delegated - Analgesics:  Opioid Agonist Combinations Failed - 02/11/2024  3:28 PM      Failed - This refill cannot be delegated      Failed - Urine Drug Screen completed in last 360 days      Passed - Valid encounter within last 3 months     Recent Outpatient Visits           2 months ago Stage 3a chronic kidney disease (HCC)   [facility] [facility] Pickard, Butler DASEN, MD   1 year ago Mixed hyperlipidemia   [place] [facility] Pickard, Butler DASEN, MD   1 year ago Mixed hyperlipidemia   [place] [facility] Duanne, Butler DASEN, MD   1 year ago Prediabetes   [place] [facility] Pickard, Butler DASEN, MD               methocarbamol  (ROBAXIN ) 500 MG tablet 20 tablet 0    Sig: Take 1 tablet (500 mg total) by mouth 2 (two) times daily.     Not Delegated - Analgesics:  Muscle Relaxants Failed - 02/11/2024  3:28 PM      Failed - This refill cannot be delegated      Passed - Valid encounter within last 6 months    Recent Outpatient Visits           2 months ago Stage 3a chronic kidney disease (HCC)   [place] [facility] Pickard, Butler DASEN, MD   1 year ago Mixed hyperlipidemia   [place] [facility] Duanne Butler DASEN, MD   1 year ago Mixed hyperlipidemia   [place] [facility] Duanne Butler DASEN, MD   1 year ago Prediabetes   [place] [facility] Duanne Butler DASEN, MD               metoprolol  succinate (TOPROL -XL)  50 MG 24 hr tablet 225 tablet 0    Sig: Take 1 tablet (50 mg total) by mouth daily. Take with or immediately following a meal.     Cardiovascular:  Beta Blockers Passed - 02/11/2024  3:28 PM      Passed - Last BP in normal range    BP Readings from Last 1 Encounters:  11/20/23 118/62         Passed - Last Heart Rate in normal range    Pulse Readings from Last 1 Encounters:  11/20/23 60         Passed - Valid encounter within last 6 months    Recent Outpatient Visits           2 months ago Stage 3a chronic kidney disease (HCC)   [place] Pickard, Butler DASEN, MD   1 year ago Mixed hyperlipidemia   [place] [facility] Pickard, Butler DASEN, MD   1 year ago Mixed hyperlipidemia   [place] [facility] Duanne, Butler DASEN, MD   1 year ago Prediabetes   [place] [facility] Pickard, Butler DASEN, MD               metoprolol  tartrate (LOPRESSOR ) 25 MG tablet 90 tablet 0    Sig: Take 1 tablet (25 mg total) by mouth daily as needed.     Cardiovascular:  Beta Blockers Passed - 02/11/2024  3:28 PM      Passed - Last BP in normal range    BP Readings from Last 1 Encounters:  11/20/23 118/62         Passed - Last Heart Rate in normal range    Pulse Readings from Last 1 Encounters:  11/20/23 60         Passed - Valid encounter within last 6 months    Recent Outpatient Visits           2 months ago Stage 3a chronic kidney disease (HCC)   [place] [facility] Pickard, Butler DASEN, MD   1 year ago Mixed hyperlipidemia   [place] [facility] Duanne, Butler DASEN, MD   1 year ago Mixed hyperlipidemia   [place] [facility] Duanne, Butler DASEN, MD   1 year ago Prediabetes   [facility] Pickard, Butler DASEN, MD               MOVANTIK  25 MG TABS tablet 30 tablet 2    Sig: Take 1 tablet (25 mg total) by mouth daily.     Gastroenterology: Opioid-Induced Constipation - naloxegol  Failed - 02/11/2024  3:28 PM      Failed - Cr in normal range and within 360 days    Creat  Date Value Ref Range Status  11/20/2023 1.24 (H) 0.50 - 1.05 mg/dL Final         Passed - Valid encounter within last 12 months    Recent Outpatient Visits           2 months ago Stage 3a chronic kidney disease (HCC)   [place] [facility] Pickard, Butler DASEN, MD   1 year ago Mixed hyperlipidemia   [place] [facility] Pickard, Butler DASEN, MD   1 year ago Mixed hyperlipidemia   [facility] Pickard, Butler DASEN, MD   1 year ago  Prediabetes  [place] [facility] Pickard, Butler DASEN, MD               nystatin  cream (MYCOSTATIN ) 60 g 2    Sig: Apply topically 3 (three) times daily.     Off-Protocol Failed - 02/11/2024  3:28 PM      Failed - Medication not assigned to a protocol, review manually.      Passed - Valid encounter within last 12 months    Recent Outpatient Visits           2 months ago Stage 3a chronic kidney disease (HCC)   [place] [facility] Pickard, Butler DASEN, MD   1 year ago Mixed hyperlipidemia   [place] [facility] Pickard, Butler DASEN, MD   1 year ago Mixed hyperlipidemia   [place] [facility] Duanne Butler DASEN, MD   1 year ago Prediabetes   [place] [facility] Pickard, Butler DASEN, MD               ondansetron  (ZOFRAN ) 4 MG tablet 20 tablet 2    Sig: Take 1 tablet (4 mg total) by mouth every 8 (eight) hours as needed for nausea or vomiting.     Not Delegated - Gastroenterology: Antiemetics - ondansetron  Failed - 02/11/2024  3:28 PM      Failed - This refill cannot be delegated      Passed - AST in normal range and within 360 days    AST  Date Value Ref Range Status  11/20/2023 13 10 - 35 U/L Final         Passed - ALT in normal range and within 360 days    ALT  Date Value Ref Range Status  11/20/2023 8 6 - 29 U/L Final         Passed - Valid encounter within last 6 months    Recent Outpatient Visits           2 months ago Stage 3a chronic kidney disease (HCC)   [place] [facility] Pickard, Butler DASEN, MD   1 year ago Mixed hyperlipidemia   [facility] Pickard, Butler DASEN, MD   1 year ago Mixed hyperlipidemia   [place] [facility] Duanne, Butler DASEN, MD   1 year ago Prediabetes   [place] [facility] Pickard, Butler DASEN, MD               rOPINIRole (REQUIP) 0.5 MG tablet       Sig: Take 1-2 tablets (0.5-1 mg total) by mouth at bedtime.     Neurology:  Parkinsonian Agents Passed - 02/11/2024  3:28 PM      Passed - Last BP in normal range    BP Readings from Last 1 Encounters:  11/20/23 118/62         Passed - Last Heart Rate in normal range    Pulse Readings from Last 1 Encounters:  11/20/23 60         Passed - Valid encounter within last 12 months    Recent Outpatient Visits           2 months ago Stage 3a chronic kidney disease (HCC)   [place] [facility] Pickard, Butler DASEN, MD   1 year ago Mixed hyperlipidemia   [place] [facility] Pickard, Butler DASEN, MD   1  year ago Mixed hyperlipidemia   [place] [facility] Pickard, Butler DASEN, MD   1 year ago Prediabetes   [place] [facility] Pickard, Butler DASEN, MD               traZODone  (DESYREL ) 50 MG tablet 30 tablet 0    Sig: Take 1 tablet (50 mg total) by mouth at bedtime as needed. for sleep     Psychiatry: Antidepressants - Serotonin Modulator Passed - 02/11/2024  3:28 PM      Passed - Completed PHQ-2 or PHQ-9 in the last 360 days      Passed - Valid encounter within last 6 months    Recent Outpatient Visits           2 months ago Stage 3a chronic kidney disease (HCC)   [place] [facility] Pickard, Butler DASEN, MD   1 year ago Mixed hyperlipidemia   [place] [facility] Pickard, Butler DASEN, MD   1 year ago Mixed hyperlipidemia   [place] [facility] Duanne, Butler DASEN, MD   1 year ago Prediabetes   [place] [facility] Duanne Butler DASEN, MD              Signed Prescriptions Disp Refills   allopurinol  (ZYLOPRIM ) 100 MG tablet 90 tablet 1    Sig: Take 1 tablet (100 mg total) by mouth daily. NO FURTHER REFILLS UNTIL SEEN BY PCP.     Endocrinology:  Gout Agents - allopurinol  Failed - 02/11/2024  3:28 PM      Failed - Uric Acid in  normal range and within 360 days    Uric Acid  Date Value Ref Range Status  01/10/2022 5.7 3.0 - 7.2 mg/dL Final    Comment:               Therapeutic target for gout patients: <6.0         Failed - Cr in normal range and within 360 days    Creat  Date Value Ref Range Status  11/20/2023 1.24 (H) 0.50 - 1.05 mg/dL Final         Passed - Valid encounter within last 12 months    Recent Outpatient Visits           2 months ago Stage 3a chronic kidney disease (HCC)   [place] [facility] Duanne Butler DASEN, MD   1 year ago Mixed hyperlipidemia   [place] [facility] Duanne Butler DASEN, MD   1 year ago Mixed hyperlipidemia   [place] [facility] Duanne Butler DASEN, MD   1 year ago Prediabetes   [place] Medicine Pickard, Butler DASEN, MD              Passed - CBC within normal limits and completed in the last 12 months    WBC  Date Value Ref Range Status  11/20/2023 7.0 3.8 - 10.8 Thousand/uL Final   RBC  Date Value Ref Range Status  11/20/2023 4.95 3.80 - 5.10 Million/uL Final   Hemoglobin  Date Value Ref Range Status  11/20/2023 15.5 11.7 - 15.5 g/dL Final  87/78/7976 86.6 11.1 - 15.9 g/dL Final   HCT  Date Value Ref Range Status  11/20/2023 48.0 (H) 35.0 - 45.0 % Final   Hematocrit  Date Value Ref Range Status  01/10/2022 41.6 34.0 - 46.6 %  Final   MCHC  Date Value Ref Range Status  11/20/2023 32.3 32.0 - 36.0 g/dL Final    Comment:    For adults, a slight decrease in the calculated MCHC value (in the range of 30 to 32 g/dL) is most likely not clinically significant; however, it should be interpreted with caution in correlation with other red cell parameters and the patient's clinical condition.    [facility]  Date Value Ref Range Status  11/20/2023 31.3 27.0 - 33.0 pg Final   MCV  Date Value Ref Range Status  11/20/2023 97.0 80.0 - 100.0 fL Final  01/10/2022 95 79 - 97 fL Final    No results found for: PLTCOUNTKUC, LABPLAT, POCPLA RDW  Date Value Ref Range Status  11/20/2023 13.9 11.0 - 15.0 % Final  01/10/2022 14.4 11.7 - 15.4 % Final          hydrOXYzine  (VISTARIL ) 25 MG capsule 30 capsule 2    Sig: Take 1 capsule (25 mg total) by mouth every 8 (eight) hours as needed.     Ear, Nose, and Throat:  Antihistamines 2 Failed - 02/11/2024  3:28 PM      Failed - Cr in normal range and within 360 days    Creat  Date Value Ref Range Status  11/20/2023 1.24 (H) 0.50 - 1.05 mg/dL Final         Passed - Valid encounter within last 12 months    Recent Outpatient Visits           2 months ago Stage 3a chronic kidney disease (HCC)   [place] [facility] Pickard, Butler DASEN, MD   1 year ago Mixed hyperlipidemia   [place] [facility] Pickard, Butler DASEN, MD   1 year ago Mixed hyperlipidemia   [place] [facility] Medicine Duanne, Butler DASEN, MD   1 year ago Prediabetes   [place] [facility] Duanne Butler DASEN, MD               levothyroxine  (SYNTHROID ) 75 MCG tablet 90 tablet 2    Sig: Take 1 tablet (75 mcg total) by mouth daily before breakfast. Need appt for more refills.     Endocrinology:  Hypothyroid Agents Passed - 02/11/2024  3:28 PM      Passed - TSH in normal range and within 360 days    TSH  Date Value Ref Range Status  11/20/2023 1.08 0.40 - 4.50 mIU/L Final         Passed - Valid encounter within last 12 months    Recent Outpatient Visits           2 months ago Stage 3a chronic kidney disease (HCC)   [place] [facility] Pick[facility], Butler DASEN, MD   1 year ago Mixed hyperlipidemia   [place] [facility] Pickard, Butler DASEN, MD   1 year ago Mixed hyperlipidemia   [place] [facility] Duanne, Butler DASEN, MD   1 year ago Prediabetes   [place] [facility] Duanne Butler DASEN, MD                pantoprazole  (PROTONIX ) 40 MG tablet 90 tablet 0    Sig: Take 1 tablet (40 mg total) by mouth daily.     Gastroenterology: Proton Pump Inhibitors Passed - 02/11/2024  3:28 PM      Passed - Valid encounter within last 12 months  Recent Outpatient Visits           2 months ago Stage 3a chronic kidney disease [facility])   [place] [facility] Pickard, Butler DASEN, MD   1 year ago Mixed hyperlipidemia   [place] [facility] Pickard, Butler DASEN, MD   1 year ago Mixed hyperlipidemia   [facility] Duanne, Butler DASEN, MD   1 year ago Prediabetes    [facility] Pickard, Butler DASEN, MD               rosuvastatin  (CRESTOR ) 20 MG tablet 90 tablet 0    Sig: Take 1 tablet (20 mg total) by mouth daily. APPT NEEDED FOR FURTHER REFILLS.     Cardiovascular:  Antilipid - Statins 2 Failed - 02/11/2024  3:28 PM      Failed - Cr in normal range and within 360 days    Creat  Date Value Ref Range Status  11/20/2023 1.24 (H) 0.50 - 1.05 mg/dL Final         Failed - Lipid Panel in normal range within the last 12 months    Cholesterol, Total  Date Value Ref Range Status  01/10/2022 177 100 - 199 mg/dL Final   Cholesterol  Date Value Ref Range Status  11/20/2023 148 <200 mg/dL Final   LDL Cholesterol (Calc)  Date Value Ref Range Status  11/20/2023 72 mg/dL (calc) Final    Comment:    Reference range: <100 . Desirable range <100 mg/dL for primary prevention;   <70 mg/dL for patients with CHD or diabetic patients  with > or = 2 CHD risk factors. SABRA LDL-C is now calculated using the Martin-Hopkins  calculation, which is a validated novel method providing  better accuracy than the Friedewald equation in the  estimation of LDL-C.  Gladis APPLETHWAITE et al. SANDREA. 7986;689(80): 2061-2068  (http://education.[facility].com/faq/FAQ164)    Direct LDL  Date Value Ref Range Status  09/06/2013 174 (H) mg/dL  Final    Comment:    ATP III Classification (LDL):       < 100        mg/dL         Optimal      899 - 129     mg/dL         Near or Above Optimal      130 - 159     mg/dL         Borderline High      160 - 189     mg/dL         High       > 809        mg/dL         Very High     HDL  Date Value Ref Range Status  11/20/2023 43 (L) > OR = 50 mg/dL Final  87/78/7976 33 (L) >39 mg/dL Final   Triglycerides  Date Value Ref Range Status  11/20/2023 253 (H) <150 mg/dL Final    Comment:    . If a non-fasting specimen was collected, consider repeat triglyceride testing on a fasting specimen if clinically indicated.  Veatrice et al. J. of Clin. Lipidol. 2015;9:129-169. SABRA          Passed - Patient is not pregnant      Passed - Valid encounter within last 12 months    Recent Outpatient Visits  2 months ago Stage 3a chronic kidney disease [facility])   [place] [facility] Pickard, Butler DASEN, MD   1 year ago Mixed hyperlipidemia   [place] [facility] Pickard, Butler DASEN, MD   1 year ago Mixed hyperlipidemia   [facility] Pickard, Butler DASEN, MD   1 year ago Prediabetes   [facility] Pickard, Butler DASEN, MD              bs

## 2024-02-11 NOTE — Telephone Encounter (Signed)
 Requested by patient . Last OV 2  months ago .  Requested Prescriptions  Pending Prescriptions Disp Refills   allopurinol  (ZYLOPRIM ) 100 MG tablet 90 tablet 1    Sig: Take 1 tablet (100 mg total) by mouth daily. NO FURTHER REFILLS UNTIL SEEN BY PCP.     Endocrinology:  Gout Agents - allopurinol  Failed - 02/11/2024  3:28 PM      Failed - Uric Acid in normal range and within 360 days    Uric Acid  Date Value Ref Range Status  01/10/2022 5.7 3.0 - 7.2 mg/dL Final    Comment:               Therapeutic target for gout patients: <6.0         Failed - Cr in normal range and within 360 days    Creat  Date Value Ref Range Status  11/20/2023 1.24 (H) 0.50 - 1.05 mg/dL Final         Passed - Valid encounter within last 12 months    Recent Outpatient Visits           2 months ago Stage 3a chronic kidney disease (HCC)   [place] [facility] Duanne Butler DASEN, MD   1 year ago Mixed hyperlipidemia   [place] [facility] Duanne Butler DASEN, MD   1 year ago Mixed hyperlipidemia   [place] Duanne Butler DASEN, MD   1 year ago Prediabetes   [place] [facility] Pickard, Butler DASEN, MD              Passed - CBC within normal limits and completed in the last 12 months    WBC  Date Value Ref Range Status  11/20/2023 7.0 3.8 - 10.8 Thousand/uL Final   RBC  Date Value Ref Range Status  11/20/2023 4.95 3.80 - 5.10 Million/uL Final   Hemoglobin  Date Value Ref Range Status  11/20/2023 15.5 11.7 - 15.5 g/dL Final  87/78/7976 86.6 11.1 - 15.9 g/dL Final   HCT  Date Value Ref Range Status  11/20/2023 48.0 (H) 35.0 - 45.0 % Final   Hematocrit  Date Value Ref Range Status  01/10/2022 41.6 34.0 - 46.6 % Final   MCHC  Date Value Ref Range Status  11/20/2023 32.3 32.0 - 36.0 g/dL Final    Comment:    For adults, a slight decrease in the calculated MCHC value (in the range of 30 to 32 g/dL) is most  likely not clinically significant; however, it should be interpreted with caution in correlation with other red cell parameters and the patient's clinical condition.    [facility]  Date Value Ref Range Status  11/20/2023 31.3 27.0 - 33.0 pg Final   MCV  Date Value Ref Range Status  11/20/2023 97.0 80.0 - 100.0 fL Final  01/10/2022 95 79 - 97 fL Final   No results found for: PLTCOUNTKUC, LABPLAT, POCPLA RDW  Date Value Ref Range Status  11/20/2023 13.9 11.0 - 15.0 % Final  01/10/2022 14.4 11.7 - 15.4 % Final          Aromatic Inhalants (VICKS VAPOINHALER) INHA      Sig: One to two inhalations into each nostril at bedtime     There is no refill protocol information for this order     aspirin  EC 81 MG tablet 30 tablet     Sig: Take 1 tablet (81 mg total) by mouth  at bedtime. STOP PRIOR TO PROCEDURE     Analgesics:  NSAIDS - aspirin  Failed - 02/11/2024  3:28 PM      Failed - Cr in normal range and within 360 days    Creat  Date Value Ref Range Status  11/20/2023 1.24 (H) 0.50 - 1.05 mg/dL Final         Passed - eGFR is 10 or above and within 360 days    GFR, Est African American  Date Value Ref Range Status  03/06/2016 58 (L) >=60 mL/min Final   GFR calc Af Amer  Date Value Ref Range Status  11/17/2019 50 (L) >59 mL/min/1.73 Final    Comment:    **In accordance with recommendations from the NKF-ASN Task force,**   [facility] is in the process of updating its eGFR calculation to the   2021 CKD-EPI creatinine equation that estimates kidney function   without a race variable.    GFR, Est Non African American  Date Value Ref Range Status  03/06/2016 50 (L) >=60 mL/min Final   GFR, Estimated  Date Value Ref Range Status  03/05/2021 50 (L) >60 mL/min Final    Comment:    (NOTE) Calculated using the CKD-EPI Creatinine Equation (2021)    eGFR  Date Value Ref Range Status  11/20/2023 49 (L) > OR = 60 mL/min/1.35m2 Final  01/10/2022 57 (L) >59 mL/min/1.73 Final          Passed - Patient is not pregnant      Passed - Valid encounter within last 12 months    Recent Outpatient Visits           2 months ago Stage 3a chronic kidney disease (HCC)   [facility] Pickard, Butler DASEN, MD   1 year ago Mixed hyperlipidemia   [place] [facility] Pickard, Butler DASEN, MD   1 year ago Mixed hyperlipidemia   [place] [facility] Duanne Butler DASEN, MD   1 year ago Prediabetes   [place] [facility] Pickard, Butler DASEN, MD               azithromycin  (ZITHROMAX ) 250 MG tablet 6 tablet 0    Sig: Take (2) tablets by mouth on day 1, then take (1) tablet by mouth on days 2-5.     Off-Protocol Failed - 02/11/2024  3:28 PM      Failed - Medication not assigned to a protocol, review manually.      Passed - Valid encounter within last 12 months    Recent Outpatient Visits           2 months ago Stage 3a chronic kidney disease (HCC)   [place] [facility] Pickard, Butler DASEN, MD   1 year ago Mixed hyperlipidemia   [place] [facility] Pickard, Butler DASEN, MD   1 year ago Mixed hyperlipidemia   [place] [facility] Pickard, Butler DASEN, MD   1 year ago Prediabetes   [place] [facility] Pickard, Butler DASEN, MD               carboxymethylcellulose (REFRESH PLUS) 0.5 % SOLN      Sig: 2 drops 3 (three) times daily as needed.     Ophthalmology:  [place] Passed - 02/11/2024  3:28 PM      Passed - Valid encounter within last 12 months    Recent Outpatient  Visits           2 months ago Stage 3a chronic kidney disease (HCC)   [place] [facility] Pickard, Butler DASEN, MD   1 year ago Mixed hyperlipidemia   [place] [facility] Pickard, Butler DASEN, MD   1 year ago Mixed hyperlipidemia   [place] [facility] Duanne, Butler DASEN, MD   1 year ago  Prediabetes   [place] [facility] Medicine Pickard, Butler DASEN, MD               colchicine  0.6 MG tablet 30 tablet 5    Sig: TAKE 1 TABLET BY MOUTH EVERY DAY AS NEEDED FOR GOUT FLARES     Endocrinology:  Gout Agents - colchicine  Failed - 02/11/2024  3:28 PM      Failed - Cr in normal range and within 360 days    Creat  Date Value Ref Range Status  11/20/2023 1.24 (H) 0.50 - 1.05 mg/dL Final         Passed - ALT in normal range and within 360 days    ALT  Date Value Ref Range Status  11/20/2023 8 6 - 29 U/L Final         Passed - AST in normal range and within 360 days    AST  Date Value Ref Range Status  11/20/2023 13 10 - 35 U/L Final         Passed - Valid encounter within last 12 months    Recent Outpatient Visits           2 months ago Stage 3a chronic kidney disease (HCC)   [place] [facility] Pickard, Butler DASEN, MD   1 year ago Mixed hyperlipidemia   [place] [facility] Duanne Butler DASEN, MD   1 year ago Mixed hyperlipidemia   [place] [facility] Duanne Butler DASEN, MD   1 year ago Prediabetes   [place] [facility] Pickard, Butler DASEN, MD              Passed - CBC within normal limits and completed in the last 12 months    WBC  Date Value Ref Range Status  11/20/2023 7.0 3.8 - 10.8 Thousand/uL Final   RBC  Date Value Ref Range Status  11/20/2023 4.95 3.80 - 5.10 Million/uL Final   Hemoglobin  Date Value Ref Range Status  11/20/2023 15.5 11.7 - 15.5 g/dL Final  87/78/7976 86.6 11.1 - 15.9 g/dL Final   HCT  Date Value Ref Range Status  11/20/2023 48.0 (H) 35.0 - 45.0 % Final   Hematocrit  Date Value Ref Range Status  01/10/2022 41.6 34.0 - 46.6 % Final   MCHC  Date Value Ref Range Status  11/20/2023 32.3 32.0 - 36.0 g/dL Final    Comment:    For adults, a slight decrease in the calculated MCHC value (in the range of 30 to 32 g/dL) is most  likely not clinically significant; however, it should be interpreted with caution in correlation with other red cell parameters and the patient's clinical condition.    [facility]  Date Value Ref Range Status  11/20/2023 31.3 27.0 - 33.0 pg Final   MCV  Date Value Ref Range Status  11/20/2023 97.0 80.0 - 100.0 fL Final  01/10/2022 95 79 - 97 fL Final   No results found for: PLTCOUNTKUC, LABPLAT, POCPLA RDW  Date Value Ref Range Status  11/20/2023 13.9 11.0 - 15.0 % Final  01/10/2022 14.4 11.7 - 15.4 % Final          furosemide  (LASIX ) 20 MG tablet 30 tablet     Sig: Take 1 tablet (20 mg total) by mouth daily.     Cardiovascular:  Diuretics - Loop Failed - 02/11/2024  3:28 PM      Failed - Cr in normal range and within 180 days    Creat  Date Value Ref Range Status  11/20/2023 1.24 (H) 0.50 - 1.05 mg/dL Final         Failed - Mg Level in normal range and within 180 days    Magnesium   Date Value Ref Range Status  12/21/2014 1.9 1.5 - 2.5 mg/dL Final         Passed - K in normal range and within 180 days    Potassium  Date Value Ref Range Status  11/20/2023 4.7 3.5 - 5.3 mmol/L Final         Passed - Ca in normal range and within 180 days    Calcium   Date Value Ref Range Status  11/20/2023 9.1 8.6 - 10.4 mg/dL Final         Passed - Na in normal range and within 180 days    Sodium  Date Value Ref Range Status  11/20/2023 140 135 - 146 mmol/L Final  01/10/2022 139 134 - 144 mmol/L Final         Passed - Cl in normal range and within 180 days    Chloride  Date Value Ref Range Status  11/20/2023 101 98 - 110 mmol/L Final         Passed - Last BP in normal range    BP Readings from Last 1 Encounters:  11/20/23 118/62         Passed - Valid encounter within last 6 months    Recent Outpatient Visits           2 months ago Stage 3a chronic kidney disease (HCC)   [place] Duanne Butler DASEN, MD   1 year ago Mixed  hyperlipidemia   [place] [facility] Duanne Butler DASEN, MD   1 year ago Mixed hyperlipidemia   [facility] Duanne, Butler DASEN, MD   1 year ago Prediabetes   [place] [facility] Duanne Butler DASEN, MD               gabapentin  (NEURONTIN ) 300 MG capsule 90 capsule 3    Sig: Take 1 capsule (300 mg total) by mouth at bedtime.     Neurology: Anticonvulsants - gabapentin  Failed - 02/11/2024  3:28 PM      Failed - Cr in normal range and within 360 days    Creat  Date Value Ref Range Status  11/20/2023 1.24 (H) 0.50 - 1.05 mg/dL Final         Passed - Completed PHQ-2 or PHQ-9 in the last 360 days      Passed - Valid encounter within last 12 months    Recent Outpatient Visits           2 months ago Stage 3a chronic kidney disease (HCC)   [place] [facility] Pickard, Butler DASEN, MD   1 year ago Mixed hyperlipidemia   [place] [facility] Pickard, Butler DASEN, MD  1 year ago Mixed hyperlipidemia   [place] [facility] Pick[facility], Butler DASEN, MD   1 year ago Prediabetes   [place] [facility] Pickard, Butler DASEN, MD               HYDROcodone -acetaminophen  (NORCO) 7.5-325 MG tablet 30 tablet 0    Sig: Take 1 tablet by mouth every 6 (six) hours as needed for severe pain (pain score 7-10).     Not Delegated - Analgesics:  Opioid Agonist Combinations Failed - 02/11/2024  3:28 PM      Failed - This refill cannot be delegated      Failed - Urine Drug Screen completed in last 360 days      Passed - Valid encounter within last 3 months    Recent Outpatient Visits           2 months ago Stage 3a chronic kidney disease (HCC)   [place] [facility] Pickard, Butler DASEN, MD   1 year ago Mixed hyperlipidemia   [place] [facility] Pickard, Butler DASEN, MD   1 year ago Mixed hyperlipidemia   [place] [facility] Duanne, Butler DASEN, MD   1 year ago Prediabetes   [place] [facility] Pickard, Butler DASEN, MD               hydrOXYzine  (VISTARIL ) 25 MG capsule 30 capsule 2    Sig: Take 1 capsule (25 mg total) by mouth every 8 (eight) hours as needed.     Ear, Nose, and Throat:  Antihistamines 2 Failed - 02/11/2024  3:28 PM      Failed - Cr in normal range and within 360 days    Creat  Date Value Ref Range Status  11/20/2023 1.24 (H) 0.50 - 1.05 mg/dL Final         Passed - Valid encounter within last 12 months    Recent Outpatient Visits           2 months ago Stage 3a chronic kidney disease (HCC)   [place] [facility] Pickard, Butler DASEN, MD   1 year ago Mixed hyperlipidemia   [place] [facility] Pickard, Butler DASEN, MD   1 year ago Mixed hyperlipidemia   [place] [facility] Medicine Duanne, Butler DASEN, MD   1 year ago Prediabetes   [place] [facility] Pickard, Butler DASEN, MD               levothyroxine  (SYNTHROID ) 75 MCG tablet 90 tablet 2    Sig: Take 1 tablet (75 mcg total) by mouth daily before breakfast. Need appt for more refills.     Endocrinology:  Hypothyroid Agents Passed - 02/11/2024  3:28 PM      Passed - TSH in normal range and within 360 days    TSH  Date Value Ref Range Status  11/20/2023 1.08 0.40 - 4.50 mIU/L Final         Passed - Valid encounter within last 12 months    Recent Outpatient Visits           2 months ago Stage 3a chronic kidney disease (HCC)   [place] [facility] Pickard, Butler DASEN, MD   1 year ago Mixed hyperlipidemia   [place] [facility] Pickard, Butler DASEN, MD   1 year ago Mixed hyperlipidemia  [place] [facility] Pickard, Butler DASEN, MD   1 year ago Prediabetes   [place] [facility] Pickard, Butler DASEN, MD               methocarbamol   (ROBAXIN ) 500 MG tablet 20 tablet 0    Sig: Take 1 tablet (500 mg total) by mouth 2 (two) times daily.     Not Delegated - Analgesics:  Muscle Relaxants Failed - 02/11/2024  3:28 PM      Failed - This refill cannot be delegated      Passed - Valid encounter within last 6 months    Recent Outpatient Visits           2 months ago Stage 3a chronic kidney disease (HCC)   [place] [facility] Pickard, Butler DASEN, MD   1 year ago Mixed hyperlipidemia   [place] [facility] Medicine Pickard, Butler DASEN, MD   1 year ago Mixed hyperlipidemia   [place] [facility] Duanne Butler DASEN, MD   1 year ago Prediabetes   [place] [facility] Medicine Duanne, Butler DASEN, MD               metoprolol  succinate (TOPROL -XL) 50 MG 24 hr tablet 225 tablet 0    Sig: Take 1 tablet (50 mg total) by mouth daily. Take with or immediately following a meal.     Cardiovascular:  Beta Blockers Passed - 02/11/2024  3:28 PM      Passed - Last BP in normal range    BP Readings from Last 1 Encounters:  11/20/23 118/62         Passed - Last Heart Rate in normal range    Pulse Readings from Last 1 Encounters:  11/20/23 60         Passed - Valid encounter within last 6 months    Recent Outpatient Visits           2 months ago Stage 3a chronic kidney disease (HCC)   [place] [facility] Medicine Pickard, Butler DASEN, MD   1 year ago Mixed hyperlipidemia   [place] [facility] Medicine Pickard, Butler DASEN, MD   1 year ago Mixed hyperlipidemia   [facility] Duanne, Butler DASEN, MD   1 year ago Prediabetes   [place] [facility] Duanne Butler DASEN, MD               metoprolol  tartrate (LOPRESSOR ) 25 MG tablet 90 tablet 0    Sig: Take 1 tablet (25 mg total) by mouth daily as needed.     Cardiovascular:  Beta Blockers Passed - 02/11/2024  3:28 PM      Passed - Last BP in normal range     BP Readings from Last 1 Encounters:  11/20/23 118/62         Passed - Last Heart Rate in normal range    Pulse Readings from Last 1 Encounters:  11/20/23 60         Passed - Valid encounter within last 6 months    Recent Outpatient Visits           2 months ago Stage 3a chronic kidney disease (HCC)   [place] [facility] Pickard, Butler DASEN, MD   1 year ago Mixed hyperlipidemia   [place] [facility] Pickard, Butler DASEN, MD  1 year ago Mixed hyperlipidemia   [place] [facility] Medicine Pickard, Butler DASEN, MD   1 year ago Prediabetes   [place] [facility] Pickard, Butler DASEN, MD               MOVANTIK  25 MG TABS tablet 30 tablet 2    Sig: Take 1 tablet (25 mg total) by mouth daily.     Gastroenterology: Opioid-Induced Constipation - naloxegol  Failed - 02/11/2024  3:28 PM      Failed - Cr in normal range and within 360 days    Creat  Date Value Ref Range Status  11/20/2023 1.24 (H) 0.50 - 1.05 mg/dL Final         Passed - Valid encounter within last 12 months    Recent Outpatient Visits           2 months ago Stage 3a chronic kidney disease (HCC)   [place] [facility] Pickard, Butler DASEN, MD   1 year ago Mixed hyperlipidemia   [place] [facility] Pickard, Butler DASEN, MD   1 year ago Mixed hyperlipidemia   [place] [facility] Medicine Pickard, Butler DASEN, MD   1 year ago Prediabetes   [place] [facility] Medicine Duanne Butler DASEN, MD               nystatin  cream (MYCOSTATIN ) 60 g 2    Sig: Apply topically 3 (three) times daily.     Off-Protocol Failed - 02/11/2024  3:28 PM      Failed - Medication not assigned to a protocol, review manually.      Passed - Valid encounter within last 12 months    Recent Outpatient Visits           2 months ago Stage 3a chronic kidney disease (HCC)   [place] [facility] Medicine  Pickard, Butler DASEN, MD   1 year ago Mixed hyperlipidemia   [place] [facility] Medicine Pickard, Butler DASEN, MD   1 year ago Mixed hyperlipidemia   [place] [facility] Medicine Duanne Butler DASEN, MD   1 year ago Prediabetes   [place] [facility] Family Medicine Pickard, Butler DASEN, MD               ondansetron  (ZOFRAN ) 4 MG tablet 20 tablet 2    Sig: Take 1 tablet (4 mg total) by mouth every 8 (eight) hours as needed for nausea or vomiting.     Not Delegated - Gastroenterology: Antiemetics - ondansetron  Failed - 02/11/2024  3:28 PM      Failed - This refill cannot be delegated      Passed - AST in normal range and within 360 days    AST  Date Value Ref Range Status  11/20/2023 13 10 - 35 U/L Final         Passed - ALT in normal range and within 360 days    ALT  Date Value Ref Range Status  11/20/2023 8 6 - 29 U/L Final         Passed - Valid encounter within last 6 months    Recent Outpatient Visits           2 months ago Stage 3a chronic kidney disease (HCC)   [place] [facility] Medicine Pickard, Butler DASEN, MD   1 year ago Mixed hyperlipidemia   [place] [facility] Family  Medicine Duanne Butler DASEN, MD   1 year ago Mixed hyperlipidemia   [place] [facility] Medicine Pickard, Butler DASEN, MD   1 year ago Prediabetes   [place] [facility] Pickard, Butler DASEN, MD               pantoprazole  (PROTONIX ) 40 MG tablet 90 tablet 0    Sig: Take 1 tablet (40 mg total) by mouth daily.     Gastroenterology: Proton Pump Inhibitors Passed - 02/11/2024  3:28 PM      Passed - Valid encounter within last 12 months    Recent Outpatient Visits           2 months ago Stage 3a chronic kidney disease (HCC)   [place] [facility] Medicine Pickard, Butler DASEN, MD   1 year ago Mixed hyperlipidemia   [place] [facility] Pickard, Butler DASEN, MD   1 year ago Mixed hyperlipidemia   [facility] Family [facility] Duanne, Butler DASEN, MD   1 year ago Prediabetes   [place] [facility] Family Medicine Pickard, Butler DASEN, MD               rOPINIRole (REQUIP) 0.5 MG tablet      Sig: Take 1-2 tablets (0.5-1 mg total) by mouth at bedtime.     Neurology:  Parkinsonian Agents Passed - 02/11/2024  3:28 PM      Passed - Last BP in normal range    BP Readings from Last 1 Encounters:  11/20/23 118/62         Passed - Last Heart Rate in normal range    Pulse Readings from Last 1 Encounters:  11/20/23 60         Passed - Valid encounter within last 12 months    Recent Outpatient Visits           2 months ago Stage 3a chronic kidney disease (HCC)   [place] [facility] Pickard, Butler DASEN, MD   1 year ago Mixed hyperlipidemia   [place] [facility] Medicine Duanne, Butler DASEN, MD   1 year ago Mixed hyperlipidemia   [place] [facility] Medicine Duanne, Butler DASEN, MD   1 year ago Prediabetes   [place] [facility] Medicine Duanne Butler DASEN, MD               rosuvastatin  (CRESTOR ) 20 MG tablet 90 tablet 0    Sig: Take 1 tablet (20 mg total) by mouth daily. APPT NEEDED FOR FURTHER REFILLS.     Cardiovascular:  Antilipid - Statins 2 Failed - 02/11/2024  3:28 PM      Failed - Cr in normal range and within 360 days    Creat  Date Value Ref Range Status  11/20/2023 1.24 (H) 0.50 - 1.05 mg/dL Final         Failed - Lipid Panel in normal range within the last 12 months    Cholesterol, Total  Date Value Ref Range Status  01/10/2022 177 100 - 199 mg/dL Final   Cholesterol  Date Value Ref Range Status  11/20/2023 148 <200 mg/dL Final   LDL Cholesterol (Calc)  Date Value Ref Range Status  11/20/2023 72 mg/dL (calc) Final    Comment:    Reference range: <100 . Desirable range <100 mg/dL for primary prevention;   <70 mg/dL for patients with CHD or diabetic patients  with >  or = 2 CHD risk  factors. [facility] LDL-C is now calculated using the Martin-Hopkins  calculation, which is a validated novel method providing  better accuracy than the Friedewald equation in the  estimation of LDL-C.  Gladis APPLETHWAITE et al. SANDREA. 7986;689(80): 2061-2068  (http://education.[facility].com/faq/FAQ164)    Direct LDL  Date Value Ref Range Status  09/06/2013 174 (H) mg/dL Final    Comment:    ATP III Classification (LDL):       < 100        mg/dL         Optimal      899 - 129     mg/dL         Near or Above Optimal      130 - 159     mg/dL         Borderline High      160 - 189     mg/dL         High       > 809        mg/dL         Very High     HDL  Date Value Ref Range Status  11/20/2023 43 (L) > OR = 50 mg/dL Final  87/78/7976 33 (L) >39 mg/dL Final   Triglycerides  Date Value Ref Range Status  11/20/2023 253 (H) <150 mg/dL Final    Comment:    . If a non-fasting specimen was collected, consider repeat triglyceride testing on a fasting specimen if clinically indicated.  Veatrice et al. J. of Clin. Lipidol. 2015;9:129-169. SABRA          Passed - Patient is not pregnant      Passed - Valid encounter within last 12 months    Recent Outpatient Visits           2 months ago Stage 3a chronic kidney disease (HCC)   [place] [facility] Medicine Pickard, Butler DASEN, MD   1 year ago Mixed hyperlipidemia   [place] [facility] Medicine Pickard, Butler DASEN, MD   1 year ago Mixed hyperlipidemia   [facility] Duanne, Butler DASEN, MD   1 year ago Prediabetes   [place] [facility] [place] Butler DASEN, MD               traZODone  (DESYREL ) 50 MG tablet 30 tablet 0    Sig: Take 1 tablet (50 mg total) by mouth at bedtime as needed. for sleep     Psychiatry: Antidepressants - Serotonin Modulator Passed - 02/11/2024  3:28 PM      Passed - Completed PHQ-2 or PHQ-9 in the last 360 days      Passed - Valid encounter  within last 6 months    Recent Outpatient Visits           2 months ago Stage 3a chronic kidney disease (HCC)   [place] [facility] Duanne Butler DASEN, MD   1 year ago Mixed hyperlipidemia   [place] [facility] Duanne Butler DASEN, MD   1 year ago Mixed hyperlipidemia   [place] [facility] Duanne Butler DASEN, MD   1 year ago Prediabetes   [facility] [place], Butler DASEN, MD

## 2024-02-11 NOTE — Telephone Encounter (Signed)
 Copied from CRM #[id number]. Topic: Clinical - Medication Refill >> Feb 11, 2024 10:41 AM Tysheama G wrote: Medication:  llopurinol (ZYLOPRIM ) 100 MG tablet Aromatic Inhalants (VICKS VAPOINHALER) INHA aspirin  EC 81 MG tablet azithromycin  (ZITHROMAX ) 250 MG tablet carboxymethylcellulose (REFRESH PLUS) 0.5 % SOLN colchicine  0.6 MG tablet furosemide  (LASIX ) 20 MG tablet gabapentin  (NEURONTIN ) 300 MG capsule HYDROcodone -acetaminophen  (NORCO) 7.5-325 MG tablet hydrOXYzine  (VISTARIL ) 25 MG capsule levothyroxine  (SYNTHROID ) 75 MCG tablet methocarbamol  (ROBAXIN ) 500 MG tablet metoprolol  succinate (TOPROL -XL) 50 MG 24 hr tablet metoprolol  tartrate (LOPRESSOR ) 25 MG tablet MOVANTIK  25 MG TABS tablet nystatin  cream (MYCOSTATIN ) ondansetron  (ZOFRAN ) 4 MG tablet OXYGEN  pantoprazole  (PROTONIX ) 40 MG tablet rOPINIRole (REQUIP) 0.5 MG tablet rosuvastatin  (CRESTOR ) 20 MG tablet traZODone  (DESYREL ) 50 MG tablet    Has the patient contacted their pharmacy? Yes (Agent: If no, request that the patient contact the pharmacy for the refill. If patient does not wish to contact the pharmacy document the reason why and proceed with request.) (Agent: If yes, when and what did the pharmacy advise?)  This is the patient's preferred pharmacy:    [facility] - [place], [place] - [address] [place] [address] Phone: [phone] Fax: [phone]  Is this the correct pharmacy for this prescription? Yes If no, delete pharmacy and type the correct one.   Has the prescription been filled recently? No  Is the patient out of the medication? No  Has the patient been seen for an appointment in the last year OR does the patient have an upcoming appointment? Yes  Can we respond through MyChart? Yes  Agent: Please be advised that Rx refills may take up to 3 business days. We ask that you follow-up with your pharmacy.

## 2024-02-13 ENCOUNTER — Other Ambulatory Visit: Payer: Self-pay | Admitting: Family Medicine

## 2024-02-13 MED ORDER — HYDROCODONE-ACETAMINOPHEN 7.5-325 MG PO TABS: 1.0000 | ORAL_TABLET | Freq: Four times a day (QID) | ORAL | 0 refills | Status: AC | PRN

## 2024-02-23 MED ORDER — ROPINIROLE HCL 0.5 MG PO TABS: 0.5000 mg | ORAL_TABLET | Freq: Every day | ORAL | 11 refills | Status: AC

## 2024-02-23 MED ORDER — METOPROLOL SUCCINATE ER 50 MG PO TB24: 50.0000 mg | ORAL_TABLET | Freq: Every day | ORAL | 0 refills | Status: AC

## 2024-02-23 MED ORDER — FUROSEMIDE 20 MG PO TABS: 20.0000 mg | ORAL_TABLET | Freq: Every day | ORAL | 11 refills | Status: AC

## 2024-02-23 MED ORDER — TRAZODONE HCL 50 MG PO TABS: 50.0000 mg | ORAL_TABLET | Freq: Every evening | ORAL | 0 refills | Status: AC | PRN

## 2024-02-23 MED ORDER — MOVANTIK 25 MG PO TABS: 25.0000 mg | ORAL_TABLET | Freq: Every day | ORAL | 2 refills | Status: AC

## 2024-02-23 MED ORDER — NYSTATIN 100000 UNIT/GM EX CREA: TOPICAL_CREAM | Freq: Three times a day (TID) | CUTANEOUS | 2 refills | Status: AC

## 2024-02-23 MED ORDER — HYDROCODONE-ACETAMINOPHEN 7.5-325 MG PO TABS: 1.0000 | ORAL_TABLET | Freq: Four times a day (QID) | ORAL | 0 refills | Status: AC | PRN

## 2024-02-23 MED ORDER — METHOCARBAMOL 500 MG PO TABS: 500.0000 mg | ORAL_TABLET | Freq: Two times a day (BID) | ORAL | 0 refills | Status: AC

## 2024-02-23 MED ORDER — CARBOXYMETHYLCELLULOSE SODIUM 0.5 % OP SOLN: 2.0000 [drp] | Freq: Three times a day (TID) | OPHTHALMIC | 11 refills | Status: AC | PRN

## 2024-02-23 MED ORDER — GABAPENTIN 300 MG PO CAPS: 300.0000 mg | ORAL_CAPSULE | Freq: Every day | ORAL | 3 refills | Status: AC

## 2024-02-23 MED ORDER — METOPROLOL TARTRATE 25 MG PO TABS: 25.0000 mg | ORAL_TABLET | Freq: Every day | ORAL | 0 refills | Status: AC | PRN

## 2024-02-23 MED ORDER — ASPIRIN EC 81 MG PO TBEC: 81.0000 mg | DELAYED_RELEASE_TABLET | Freq: Every day | ORAL | 11 refills | Status: AC

## 2024-02-23 MED ORDER — COLCHICINE 0.6 MG PO TABS: ORAL_TABLET | ORAL | 5 refills | Status: AC

## 2024-02-23 MED ORDER — ONDANSETRON HCL 4 MG PO TABS: 4.0000 mg | ORAL_TABLET | Freq: Three times a day (TID) | ORAL | 2 refills | Status: AC | PRN

## 2024-10-13 ENCOUNTER — Ambulatory Visit
